# Patient Record
Sex: Female | Born: 1950 | Race: White | Hispanic: No
Health system: Western US, Academic
[De-identification: ages and names within clinical notes are randomized; demographics above are authoritative.]

## PROBLEM LIST (undated history)

## (undated) DIAGNOSIS — F32A Depression, unspecified: Secondary | ICD-10-CM

## (undated) DIAGNOSIS — K219 Gastro-esophageal reflux disease without esophagitis: Secondary | ICD-10-CM

## (undated) DIAGNOSIS — D72829 Elevated white blood cell count, unspecified: Secondary | ICD-10-CM

## (undated) DIAGNOSIS — F419 Anxiety disorder, unspecified: Secondary | ICD-10-CM

## (undated) DIAGNOSIS — M1990 Unspecified osteoarthritis, unspecified site: Secondary | ICD-10-CM

## (undated) DIAGNOSIS — M5090 Cervical disc disorder, unspecified, unspecified cervical region: Secondary | ICD-10-CM

## (undated) DIAGNOSIS — N950 Postmenopausal bleeding: Secondary | ICD-10-CM

## (undated) DIAGNOSIS — J45909 Unspecified asthma, uncomplicated: Secondary | ICD-10-CM

## (undated) HISTORY — DX: Unspecified asthma, uncomplicated: J45.909

## (undated) HISTORY — DX: Elevated white blood cell count, unspecified: D72.829

## (undated) HISTORY — DX: Unspecified osteoarthritis, unspecified site: M19.90

## (undated) HISTORY — DX: Postmenopausal bleeding: N95.0

## (undated) HISTORY — PX: PR UNLISTED PROCEDURE FEMUR/KNEE: 27599

## (undated) HISTORY — DX: Cervical disc disorder, unspecified, unspecified cervical region: M50.90

## (undated) HISTORY — DX: Gastro-esophageal reflux disease without esophagitis: K21.9

## (undated) HISTORY — DX: Anxiety disorder, unspecified: F41.9

## (undated) HISTORY — PX: PR UNLISTED PROCEDURE NECK/THORAX: 21899

## (undated) HISTORY — DX: Depression, unspecified: F32.A

## (undated) MED ORDER — FOLIC ACID 1 MG OR TABS
ORAL_TABLET | ORAL | 3 refills | Status: AC
Start: 2019-08-03 — End: ?

## (undated) MED ORDER — ELIQUIS 5 MG OR TABS: 5.0000 mg | ORAL_TABLET | Freq: Two times a day (BID) | ORAL | 3 refills | Status: AC

## (undated) MED ORDER — METHOTREXATE SODIUM 2.5 MG OR TABS
ORAL_TABLET | ORAL | 3 refills | Status: AC
Start: 2020-09-26 — End: ?

## (undated) MED ORDER — VENTOLIN HFA 108 (90 BASE) MCG/ACT IN AERS
INHALATION_SPRAY | RESPIRATORY_TRACT | Status: AC
Start: 2015-05-05 — End: ?

## (undated) MED ORDER — METHOTREXATE SODIUM 2.5 MG OR TABS
ORAL_TABLET | ORAL | 0 refills | Status: AC
Start: 2022-04-20 — End: ?

## (undated) MED ORDER — FOLIC ACID 1 MG OR TABS
ORAL_TABLET | ORAL | 3 refills | Status: AC
Start: 2020-09-26 — End: ?

---

## 1999-08-19 HISTORY — PX: SURGICAL HX OTHER: 99

## 2005-11-18 HISTORY — PX: PR COLONOSCOPY FLX DX W/COLLJ SPEC WHEN PFRMD: 45378

## 2010-02-18 DIAGNOSIS — M79674 Pain in right toe(s): Secondary | ICD-10-CM

## 2010-02-18 HISTORY — DX: Pain in right toe(s): M79.674

## 2011-04-19 DIAGNOSIS — R252 Cramp and spasm: Secondary | ICD-10-CM

## 2011-04-19 HISTORY — DX: Cramp and spasm: R25.2

## 2011-09-16 ENCOUNTER — Other Ambulatory Visit (INDEPENDENT_AMBULATORY_CARE_PROVIDER_SITE_OTHER): Payer: Self-pay

## 2011-09-16 MED ORDER — FLUTICASONE PROPIONATE 50 MCG/ACT NA SUSP
NASAL | Status: DC
Start: 2011-09-16 — End: 2012-02-29

## 2011-09-16 MED ORDER — TRIAMTERENE-HCTZ 37.5-25 MG OR TABS
ORAL_TABLET | ORAL | Status: DC
Start: 2011-09-16 — End: 2012-02-29

## 2011-09-16 MED ORDER — MONTELUKAST SODIUM 10 MG OR TABS
ORAL_TABLET | ORAL | Status: DC
Start: 2011-09-16 — End: 2012-02-29

## 2011-09-16 MED ORDER — LANSOPRAZOLE 30 MG OR CPDR
DELAYED_RELEASE_CAPSULE | ORAL | Status: DC
Start: 2011-09-16 — End: 2012-02-29

## 2011-09-16 MED ORDER — FLOVENT HFA 110 MCG/ACT IN AERO
INHALATION_SPRAY | RESPIRATORY_TRACT | Status: DC
Start: 2011-09-16 — End: 2013-08-20

## 2011-09-16 MED ORDER — PROAIR HFA 108 (90 BASE) MCG/ACT IN AERS
INHALATION_SPRAY | RESPIRATORY_TRACT | Status: DC
Start: 2011-09-16 — End: 2013-08-16

## 2011-11-20 ENCOUNTER — Other Ambulatory Visit (INDEPENDENT_AMBULATORY_CARE_PROVIDER_SITE_OTHER): Payer: Self-pay

## 2011-11-22 MED ORDER — CLOTRIMAZOLE-BETAMETHASONE 1-0.05 % EX CREA
TOPICAL_CREAM | CUTANEOUS | Status: DC
Start: 2011-11-20 — End: 2012-05-26

## 2011-12-30 ENCOUNTER — Telehealth (INDEPENDENT_AMBULATORY_CARE_PROVIDER_SITE_OTHER): Payer: Self-pay

## 2011-12-30 NOTE — Telephone Encounter (Signed)
You have chart

## 2011-12-30 NOTE — Telephone Encounter (Signed)
Tell her that normally we have the Sleep specialist who ordered it do this.

## 2011-12-30 NOTE — Telephone Encounter (Signed)
Message copied by Consuello Bossier on Thu Dec 30, 2011  9:11 AM  ------       Message from: Ovidio Hanger D'ONE       Created: Wed Dec 29, 2011  4:51 PM       Contact: Patient         JSH, contact # is [phone]       Pt needs you to fax a presc to [facility] at [phone], for a CPAP machine. Name is Resmed/model # [id number]. Thx, md  ------

## 2012-01-06 ENCOUNTER — Telehealth (INDEPENDENT_AMBULATORY_CARE_PROVIDER_SITE_OTHER): Payer: Self-pay

## 2012-01-06 NOTE — Telephone Encounter (Signed)
It was explained to pt that the sleep specialist is normally responsible for writing orders for machines and supplies. Contact info was given to [facility] sleep med specialists if she needs it.

## 2012-02-29 ENCOUNTER — Other Ambulatory Visit (INDEPENDENT_AMBULATORY_CARE_PROVIDER_SITE_OTHER): Payer: Self-pay

## 2012-02-29 MED ORDER — LANSOPRAZOLE 30 MG OR CPDR
DELAYED_RELEASE_CAPSULE | ORAL | Status: DC
Start: 2012-02-29 — End: 2012-06-22

## 2012-02-29 MED ORDER — MONTELUKAST SODIUM 10 MG OR TABS
ORAL_TABLET | ORAL | Status: DC
Start: 2012-02-29 — End: 2012-06-20

## 2012-02-29 MED ORDER — TRIAMTERENE-HCTZ 37.5-25 MG OR TABS
ORAL_TABLET | ORAL | Status: DC
Start: 2012-02-29 — End: 2012-06-22

## 2012-02-29 MED ORDER — FLUTICASONE PROPIONATE 50 MCG/ACT NA SUSP
NASAL | Status: DC
Start: 2012-02-29 — End: 2012-08-25

## 2012-03-17 ENCOUNTER — Other Ambulatory Visit: Payer: Self-pay

## 2012-05-26 ENCOUNTER — Other Ambulatory Visit (INDEPENDENT_AMBULATORY_CARE_PROVIDER_SITE_OTHER): Payer: Self-pay

## 2012-05-27 ENCOUNTER — Encounter (INDEPENDENT_AMBULATORY_CARE_PROVIDER_SITE_OTHER): Payer: Self-pay

## 2012-05-27 MED ORDER — CLOTRIMAZOLE-BETAMETHASONE 1-0.05 % EX CREA
TOPICAL_CREAM | CUTANEOUS | Status: DC
Start: 2012-05-26 — End: 2015-05-30

## 2012-05-27 NOTE — Telephone Encounter (Signed)
error 

## 2012-06-20 ENCOUNTER — Other Ambulatory Visit (INDEPENDENT_AMBULATORY_CARE_PROVIDER_SITE_OTHER): Payer: Self-pay

## 2012-06-21 ENCOUNTER — Encounter (INDEPENDENT_AMBULATORY_CARE_PROVIDER_SITE_OTHER): Payer: Self-pay

## 2012-06-21 MED ORDER — MONTELUKAST SODIUM 10 MG OR TABS
ORAL_TABLET | ORAL | Status: DC
Start: 2012-06-20 — End: 2012-08-25

## 2012-06-22 ENCOUNTER — Other Ambulatory Visit (INDEPENDENT_AMBULATORY_CARE_PROVIDER_SITE_OTHER): Payer: Self-pay

## 2012-06-22 MED ORDER — TRIAMTERENE-HCTZ 37.5-25 MG OR TABS
ORAL_TABLET | ORAL | Status: DC
Start: 2012-06-22 — End: 2012-08-25

## 2012-06-22 MED ORDER — LANSOPRAZOLE 30 MG OR CPDR
DELAYED_RELEASE_CAPSULE | ORAL | Status: DC
Start: 2012-06-22 — End: 2012-08-25

## 2012-07-24 ENCOUNTER — Encounter (INDEPENDENT_AMBULATORY_CARE_PROVIDER_SITE_OTHER): Payer: Self-pay

## 2012-07-24 DIAGNOSIS — F4321 Adjustment disorder with depressed mood: Secondary | ICD-10-CM | POA: Insufficient documentation

## 2012-07-27 ENCOUNTER — Other Ambulatory Visit (INDEPENDENT_AMBULATORY_CARE_PROVIDER_SITE_OTHER): Payer: Self-pay

## 2012-07-27 MED ORDER — LANSOPRAZOLE 30 MG OR CPDR
DELAYED_RELEASE_CAPSULE | ORAL | Status: DC
Start: 2012-07-27 — End: 2012-08-25

## 2012-07-27 MED ORDER — TRIAMTERENE-HCTZ 37.5-25 MG OR TABS
ORAL_TABLET | ORAL | Status: DC
Start: 2012-07-27 — End: 2012-08-25

## 2012-07-27 MED ORDER — MONTELUKAST SODIUM 10 MG OR TABS
ORAL_TABLET | ORAL | Status: DC
Start: 2012-07-27 — End: 2012-08-25

## 2012-07-28 ENCOUNTER — Encounter (INDEPENDENT_AMBULATORY_CARE_PROVIDER_SITE_OTHER): Payer: Self-pay

## 2012-07-28 DIAGNOSIS — M1990 Unspecified osteoarthritis, unspecified site: Secondary | ICD-10-CM | POA: Insufficient documentation

## 2012-07-31 ENCOUNTER — Other Ambulatory Visit: Payer: Self-pay

## 2012-07-31 ENCOUNTER — Ambulatory Visit (INDEPENDENT_AMBULATORY_CARE_PROVIDER_SITE_OTHER): Payer: No Typology Code available for payment source

## 2012-07-31 ENCOUNTER — Encounter (INDEPENDENT_AMBULATORY_CARE_PROVIDER_SITE_OTHER): Payer: Self-pay

## 2012-07-31 VITALS — BP 134/88 | HR 82 | Ht 66.0 in | Wt 318.0 lb

## 2012-07-31 LAB — CBC, DIFF
% Basophils: 1 %
% Eosinophils: 3 %
% Immature Granulocytes: 1 %
% Lymphocytes: 23 %
% Monocytes: 5 %
% Neutrophils: 67 %
Absolute Eosinophil Count: 0.29 10*3/uL (ref 0.00–0.50)
Absolute Lymphocyte Count: 2.34 10*3/uL (ref 1.00–4.80)
Basophils: 0.05 10*3/uL (ref 0.00–0.20)
Hematocrit: 45 % (ref 36–45)
Hemoglobin: 15.3 g/dL (ref 11.5–15.5)
Immature Granulocytes: 0.05 10*3/uL (ref 0.00–0.05)
MCH: 28.1 pg (ref 27.3–33.6)
MCHC: 34.2 g/dL (ref 32.2–36.5)
MCV: 82 fL (ref 81–98)
Monocytes: 0.56 10*3/uL (ref 0.00–0.80)
Neutrophils: 7.04 10*3/uL — ABNORMAL HIGH (ref 1.80–7.00)
Platelet Count: 279 10*3/uL (ref 150–400)
RBC: 5.44 mil/uL — ABNORMAL HIGH (ref 3.80–5.00)
RDW-CV: 14.6 % — ABNORMAL HIGH (ref 11.6–14.4)
WBC: 10.33 10*3/uL — ABNORMAL HIGH (ref 4.3–10.0)

## 2012-07-31 LAB — COMPREHENSIVE METABOLIC PANEL
ALT (GPT): 26 U/L (ref 6–40)
AST (GOT): 23 U/L (ref 15–40)
Albumin: 4.1 g/dL (ref 3.5–5.2)
Alkaline Phosphatase (Total): 76 U/L (ref 31–132)
Anion Gap: 11 (ref 3–11)
Bilirubin (Total): 0.9 mg/dL (ref 0.2–1.3)
Calcium: 9.5 mg/dL (ref 8.9–10.2)
Carbon Dioxide, Total: 24 mEq/L (ref 22–32)
Chloride: 103 mEq/L (ref 98–108)
Creatinine: 0.84 mg/dL (ref 0.38–1.02)
GFR, Calc, African American: >60 mL/min (ref >59)
GFR, Calc, European American: >60 mL/min (ref >59)
Glucose: 104 mg/dL (ref 62–125)
Potassium: 3.6 mEq/L — ABNORMAL LOW (ref 3.7–5.2)
Protein (Total): 6.6 g/dL (ref 6.0–8.2)
Sodium: 138 mEq/L (ref 136–145)
Urea Nitrogen: 13 mg/dL (ref 8–21)

## 2012-07-31 LAB — LIPID PANEL
Cholesterol (LDL): 119 mg/dL (ref <130)
Cholesterol/HDL Ratio: 3.4
HDL Cholesterol: 60 mg/dL (ref >40)
Non-HDL Cholesterol: 141 mg/dL (ref 0–159)
Total Cholesterol: 201 mg/dL — ABNORMAL HIGH (ref <200)
Triglyceride: 111 mg/dL (ref <150)

## 2012-07-31 LAB — MAGNESIUM: Magnesium: 2.1 mg/dL (ref 1.8–2.4)

## 2012-07-31 LAB — THYROID STIMULATING HORMONE: Thyroid Stimulating Hormone: 2.299 u[IU]/mL (ref 0.400–5.000)

## 2012-07-31 NOTE — Progress Notes (Signed)
DATE: 07/31/2012     Caitlin Wiley is a 62 year old female who is here today for review of multiple medical problems and for a wellness exam.     PROBLEMS TO BE ADDRESSED ON TODAYS VISIT:  (V70.0) Routine general medical examination at a health care facility  (primary encounter diagnosis)  (272.0) Hypercholesterolemia  (401.9) HTN (hypertension)  (493.00) Asthma, mild intermittent, uncomplicated  (V12.72) Hx of colonic polyps--Dr Read--03/21/10  cleared for 3 years  (780.57) Sleep apnea  (715.90) OA (osteoarthritis)    HPI:  Caitlin Wiley is a 62 year old female who is here today for review of multiple medical problems and for a wellness exam. Pt reports stable BP for the past while, however she noted an elevated BP while in the clinic today. Pt also reports palpitations last Thursday, however she did feel anxious and dehydrated at the time. The palpitations were described as tachycardia with the symptom lasting 5-10 minutes, she denied associated chest pain or nausea. Pt states that the palpitations completely resolved after drinking a large glass of water and resting for five minutes. Pt reports history of palpitations secondary to anxiety that are the same in nature and severity as the episode on Thursday. Denies exacerbations or changes with her asthma. Pt also notes intermittent dizziness with positional changes such as standing up and especially with rapidly turning her head laterally. Pt states that the dizziness episodes occur once a day and last only a few seconds. Pt also reports R visual flashes, she has been evaluated for the symptoms by Dr. Yetta Barre. Now, pt reports dissipated visual flashes and she only experiences "black" floaters to the R eye. Denies any other medical complaints at this time.     Last colonoscopy performed on 03/21/10, pt is cleared for three years.     PROBLEM LIST:  Patient Active Problem List   Diagnosis    Asthma    Hypercholesterolemia    Hx of colonic polyps--Dr  Read--03/21/10  cleared for 5 years    Edema    Sleep apnea    HTN (hypertension)    Menopausal state    Rash    Pulmonary nodule    Grief reaction    Strain of thoracic spine    OA (osteoarthritis)       SURGICAL HISTORY:  Past Surgical History   Procedure Laterality Date    Colonoscopy flx dx w/wo collj specimens  11/2005    Surgical hx other  08/1999     knee surgery       MEDICATIONS:  Outpatient Prescriptions Prior to Visit   Medication Sig Dispense Refill    Ascorbic Acid (VITAMIN C OR) 500 mg        ASPIRIN OR 81 mg        CALCIUM OR 600 mg        CLONAZEPAM OR 0.5 mg q 12 h prn anxiety        Clotrimazole-Betamethasone 1-0.05 % External Cream APPLY TO AFFECTED AREA(S) TWO TIMES DAILY  30 g  0    FISH OIL  1000 mg        Flaxseed, Linseed, (FLAXSEED OIL) 1200 MG Oral Cap None Entered        FLOVENT HFA 110 MCG/ACT Inhalation Aerosol INHALE TWO PUFFS BY MOUTH TWICE DAILY - RINSE MOUTH AFTER USE  2 Inhaler  3    Fluticasone Propionate 50 MCG/ACT Nasal Suspension USE TWO SPRAYS IN EACH NOSTRIL TWO TIMES DAILY  1 Inhaler  3    Lansoprazole 30 MG Oral CAPSULE DELAYED RELEASE TAKE ONE CAPSULE BY MOUTH ONCE DAILY  32 Cap  3    Lansoprazole 30 MG Oral CAPSULE DELAYED RELEASE TAKE ONE CAPSULE BY MOUTH ONCE DAILY  32 Cap  3    Montelukast Sodium 10 MG Oral Tab TAKE ONE TABLET BY MOUTH ONCE DAILY  30 Tab  3    Montelukast Sodium 10 MG Oral Tab TAKE ONE TABLET BY MOUTH ONCE DAILY  30 Tab  3    Multiple Vitamins-Minerals (MULTIVITAMIN OR) no iron        PROAIR HFA 108 (90 BASE) MCG/ACT Inhalation Aero Soln INHALE ONE TO TWO PUFFS BY MOUTH FOUR TIMES A DAY AS NEEDED  2 Inhaler  3    Triamterene-HCTZ 37.5-25 MG Oral Tab TAKE ONE TABLET BY MOUTH ONCE DAILY  34 Tab  3    Triamterene-HCTZ 37.5-25 MG Oral Tab TAKE ONE TABLET BY MOUTH ONCE DAILY  34 Tab  3     No facility-administered medications prior to visit.       ALLERGIES:  Review of patient's allergies indicates:  Allergies   Allergen Reactions     Bee Venom     Cats (Animals)     Dust Mite Extract      DUST    Pollen Extract      POLLEN    Sulfa Antibiotics        FAMILY HISTORY:  Family History   Problem Relation Age of Onset    Arthritis Mother     Arthritis Father     Asthma       and/or allergies - grandfather    Breast Cancer Mother      & tongue cancer    Cancer Father     Cancer       grandparents    Diabetes Father     Colon Polyps Father     Heart Disease Father      torn valve 01/1999    Heart Disease Maternal Grandfather     Heart Disease Paternal Grandfather     Hypertension Father     Mental Illness Mother     Stroke Father     Stroke Mother      possible    Tuberculosis       great-grandmother    Arthritis       grandmother    Breast Cancer       grandmother    Hypertension Mother     Mental Illness Brother     Anesthesia Problems Mother     Lupus Aunt/Uncle      great-aunt       SOCIAL HISTORY:  History     Social History    Marital Status: N/A     Spouse Name: N/A     Number of Children: N/A    Years of Education: N/A     Occupational History    Not on file.     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 Endorses dizziness.   OPHTHALMIC: endorses floaters and flashes to the R eye.   ENT: Denies, hearing difficulties, nasal congestion and any ear nose or throat problems  RESPIRATORY: H/o asthma, sleep apnea   CARDIOVASCULAR: H/o HTN. Denies CP. Endorses palpitations last Thursday that have completely resolved since. Pt reports elevated BP today.   GI: H/o colonic polyps. Denies nausea.   BREAST: Denies,  breast pain, breast nipple discharge, history of abnormal mammogram, history of breast mass  GENITOURINARY: Denies, menstrual problems including irregular, heavy or painful menses, prior abnormal pap smear, dysuria, hematuria, urinary incontinence, urinary urgency, vaginal discharge, abnormal vaginal bleeding, pelvic pain  INTEGUMENTARY: Denies, skin rash, easy bruising, hair loss, changes in moles or new moles  RHEUMATOLOGIC/MSK: H/o osteoarthritis   ENDOCRINE: H/o hypercholesterolemia   HEME/LYMPH:  Denies, Easy bruising, Lumps under arms or around neck and Anemia  PSYCHOSOCIAL: Denies and any psychological problems  HIV RISK: Denies, multiple sexual partners, history of a sexually transmitted disease, transfusion between 1978 and 1985 and use of illicit drugs by injection    PHYSICAL EXAM:  BP 134/88   Pulse 82   Ht 5\' 6"  (1.676 m)   Wt 318 lb (144.244 kg)   BMI 51.35 kg/m2    Physical Examination--Female: Vital Signs as noted above.  BP LUE, sitting: 134/88    General Appearance and Mental Status: Well Developed/Well Nourished  [ ]  Slender  [ ]  Average  [ ]  Heavy [ x] Obese  [x ] Appropriate Affect, Oriented to Person, Place, Time      Skin: Warm, dry to touch    Head: Normocephalic, atraumatic    Eyes: EOMI, PERLA    Ears: TMs clear bilaterally    Nose: Septum intact, no lesions, pink mucosa and turbinates, no discharge    Pharynx: Clear    Neck: Supple, without JVD  or goiter    Chest: Clear     CV: Heart: RRR, no murmur, rub, S3, S4 noted    Breasts: No dominant masses appreciated  Sarah present    Abdomen: Soft, bowel sounds present, without masses or tenderness noted, no hepatosplenomegaly noted.    Extrem: no edema    MSK: some crepitation of the knees  Neuro: intact      LAB RESULTS:  pending    ASSESSMENT AND PLAN:  Pertinent Labs & Imaging studies reviewed. (See chart for details)  Prior EMR records reviewed in EPIC as available and clinically relevant.    1. Hypercholesterolemia  Lipid  panel  CMP    2. HTN (hypertension)  CBC  CMP    3. Asthma, mild intermittent, uncomplicated  stable    4. Hx of colonic polyps--Dr Read--03/21/10  cleared for 3 years    5. Sleep apnea  stable    6. OA (osteoarthritis)  stable    7. Routine general medical examination at a health care facility  - CBC, DIFF  - COMPREHENSIVE METABOLIC PANEL  - LIPID PANEL  - THYROID STIMULATING HORMONE  - U/A AUTO DIPSTICK ONLY  - URINE C&S  - [place] COMPLETE URINALYSIS  - ECG ROUTINE ECG W/LEAST 12 LDS W/I&R  - MAGNESIUM  - DXA BONE DENSITY, AXIAL    8. Arrhythmia  See Dr Marvel Plan  - REFERRAL TO CARDIOLOGY    9. Visual problems--flashes--per Dr Yetta Barre    10. Menopausal state  - DXA BONE DENSITY, AXIAL  Pt provided with cardiology referral. Further treatment dependant on the results of the tests.    Seek immediate attention if symptoms are worsening.    Call if symptoms are not resolving with therapy.      Portions of this chart were written by Erroll Luna, Medical Scribe with oversight by Katy Fitch, MD 07/31/12 9:58 AM     I, Katy Fitch, have reviewed the information recorded by the scribe for accuracy and agree with its contents.    This medical record has been electronically generated with voice recognition software. Every attempt is made to assure accuracy.   However. it may contain errors related to this process.

## 2012-07-31 NOTE — Progress Notes (Signed)
I, Katy Fitch, MD interviewed and examined the patient while overseeing the documentation performed by my Medical Scribe, Erroll Luna. I have reviewed and revised as necessary the scribe's note and agree with the documented findings and plan of care. Please refer to the scribe's note below for further detailed information regarding the patient encounter and exam.  07/31/2012. 10:56 AM.

## 2012-07-31 NOTE — Patient Instructions (Addendum)
A letter will be coming within the next two weeks reviewing the lab results.    Seek immediate attention if symptoms are worsening.    Call if symptoms are not resolving with therapy.

## 2012-08-04 ENCOUNTER — Encounter (INDEPENDENT_AMBULATORY_CARE_PROVIDER_SITE_OTHER): Payer: Self-pay

## 2012-08-08 LAB — PR U/A AUTO DIPSTICK ONLY, ONSITE
Bilirubin (Indirect): NEGATIVE
Glucose, Urine: NEGATIVE mg/dL
Ketones, URN: NEGATIVE mg/dL
Nitrite, URN: NEGATIVE
Occult Blood, URN: NEGATIVE
Protein: NEGATIVE mg/dL
Specific Gravity, URN: 1.01 (ref 1.005–1.030)
Urobilinogen, URN: NEGATIVE E.U./dL
pH, URN (UWNC): 6.5 (ref 5–8)

## 2012-08-16 ENCOUNTER — Encounter (INDEPENDENT_AMBULATORY_CARE_PROVIDER_SITE_OTHER): Payer: Self-pay

## 2012-08-18 ENCOUNTER — Telehealth (INDEPENDENT_AMBULATORY_CARE_PROVIDER_SITE_OTHER): Payer: Self-pay

## 2012-08-18 NOTE — Telephone Encounter (Signed)
Due for CMP

## 2012-08-24 ENCOUNTER — Other Ambulatory Visit (INDEPENDENT_AMBULATORY_CARE_PROVIDER_SITE_OTHER): Payer: Self-pay

## 2012-08-25 MED ORDER — MONTELUKAST SODIUM 10 MG OR TABS
ORAL_TABLET | ORAL | Status: DC
Start: 2012-08-24 — End: 2012-12-18

## 2012-08-25 MED ORDER — TRIAMTERENE-HCTZ 37.5-25 MG OR TABS
ORAL_TABLET | ORAL | Status: DC
Start: 2012-08-24 — End: 2013-03-30

## 2012-08-25 MED ORDER — FLUTICASONE PROPIONATE 50 MCG/ACT NA SUSP
NASAL | Status: DC
Start: 2012-08-24 — End: 2013-08-16

## 2012-08-25 MED ORDER — LANSOPRAZOLE 30 MG OR CPDR
DELAYED_RELEASE_CAPSULE | ORAL | Status: DC
Start: 2012-08-24 — End: 2013-01-19

## 2012-11-07 ENCOUNTER — Encounter (INDEPENDENT_AMBULATORY_CARE_PROVIDER_SITE_OTHER): Payer: Self-pay

## 2012-11-08 ENCOUNTER — Ambulatory Visit (INDEPENDENT_AMBULATORY_CARE_PROVIDER_SITE_OTHER): Payer: No Typology Code available for payment source

## 2012-11-08 ENCOUNTER — Encounter (INDEPENDENT_AMBULATORY_CARE_PROVIDER_SITE_OTHER): Payer: Self-pay

## 2012-11-08 ENCOUNTER — Encounter (INDEPENDENT_AMBULATORY_CARE_PROVIDER_SITE_OTHER): Payer: No Typology Code available for payment source

## 2012-11-08 VITALS — BP 130/90 | HR 65 | Ht 66.25 in | Wt 314.0 lb

## 2012-11-08 MED ORDER — RANITIDINE HCL 150 MG OR TABS
150.0000 mg | ORAL_TABLET | Freq: Every evening | ORAL | Status: DC
Start: 2012-11-08 — End: 2013-01-02

## 2012-11-08 NOTE — Progress Notes (Signed)
DATE: 11/08/2012   Caitlin Wiley is a 62 year old female who is here today for review of multiple medical problems.    PROBLEMS TO BE ADDRESSED ON TODAYS VISIT:  (786.50) Chest pain  (primary encounter diagnosis)  (530.81) GERD (gastroesophageal reflux disease)  (401.9) HTN (hypertension)  (793.11) Pulmonary nodule  (493.90) Asthma, unspecified asthma severity, uncomplicated      HPI:  Caitlin Wiley is a 62 year old female who is here today for review of multiple medical problems. The patient was recently evaluated at the [facility] ED on 11/01/12 for chest pain. The patient was prompted to go to the ED due to continuous mid-sternal chest pain that lasted for three hours. She denied radiation to the LUE or neck, dyspnea, nausea or diaphoresis. A chest pain work up was completed with a CTA of the chest as well, with no acute processes resulting. The patient was subsequently discharged, with thoughts that the pain was due to acid reflux. She does admit to consuming a rich [place] omelet 1.5 hours prior to onset of the chest pain on 11/01/12. H/o similar pain with prior GERD episodes, however the patient was concerned during this episode, as the pain was slightly higher than normal. The patient does follow Dr. Gaylyn Lambert with regards to the GERD, and uses Lansoprazole with relief. Denies current CP, dyspnea, nausea, diaphoresis.     Stress echo completed on 09/11/12 with Dr. Marvel Plan, resulting in scattered PVC, hypertensive exercise response and limited exercise duration. This is a low risk study.     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 The patient was seeing a therapist (Dr. Robinette Haines), but no longer feels this is necessary. Denies SI.     Colonoscopy performed on 04/17/10 with Dr. 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 DXA completed on 07/31/12, resulting in normal bone density of the lumbar spine, and normal bone density at the L hip. She does  get screening mammograms regularly.     PROBLEM LIST:  Patient Active Problem List   Diagnosis    Asthma    Hypercholesterolemia    Hx of colonic polyps--Dr Read--03/21/10  cleared for 5 years    Edema    Sleep apnea    HTN (hypertension)    Menopausal state    Rash    Pulmonary nodule    Grief reaction    Strain of thoracic spine    OA (osteoarthritis)       SURGICAL HISTORY:  Past Surgical History   Procedure Laterality Date    Colonoscopy flx dx w/wo collj specimens  11/2005    Surgical hx other  08/1999     knee surgery       MEDICATIONS:  Outpatient Prescriptions Prior to Visit   Medication Sig Dispense Refill    Ascorbic Acid (VITAMIN C OR) 500 mg        ASPIRIN OR 81 mg        CALCIUM OR 600 mg        CLONAZEPAM OR 0.5 mg q 12 h prn anxiety        Clotrimazole-Betamethasone 1-0.05 % External Cream APPLY TO AFFECTED AREA(S) TWO TIMES DAILY  30 g  0    FISH OIL  1000 mg        Flaxseed, Linseed, (FLAXSEED OIL) 1200 MG Oral Cap None Entered        FLOVENT HFA 110 MCG/ACT Inhalation Aerosol INHALE TWO PUFFS BY  MOUTH TWICE DAILY - RINSE MOUTH AFTER USE  2 Inhaler  3    Fluticasone Propionate 50 MCG/ACT Nasal Suspension USE TWO SPRAYS IN EACH NOSTRIL TWO TIMES DAILY  2 Inhaler  3    Lansoprazole 30 MG Oral CAPSULE DELAYED RELEASE TAKE ONE CAPSULE BY MOUTH ONCE DAILY  32 capsule  3    Montelukast Sodium 10 MG Oral Tab TAKE ONE TABLET BY MOUTH ONCE DAILY  30 tablet  3    Multiple Vitamins-Minerals (MULTIVITAMIN OR) no iron        PROAIR HFA 108 (90 BASE) MCG/ACT Inhalation Aero Soln INHALE ONE TO TWO PUFFS BY MOUTH FOUR TIMES A DAY AS NEEDED  2 Inhaler  3    Triamterene-HCTZ 37.5-25 MG Oral Tab TAKE ONE TABLET BY MOUTH ONCE DAILY  34 tablet  3     No facility-administered medications prior to visit.       Herbert Moors MD, have reviewed the medication list in its entirety and agree with all contents.     ALLERGIES:  Review of patient's allergies indicates:  Allergies   Allergen Reactions     Bee Venom     Cats (Animals)     Dust Mite Extract      DUST    Pollen Extract      POLLEN    Sulfa Antibiotics        FAMILY HISTORY:  Family History   Problem Relation Age of Onset    Arthritis Mother     Arthritis Father     Asthma       and/or allergies - grandfather    Breast Cancer Mother      & tongue cancer    Cancer Father     Cancer       grandparents    Diabetes Father     Colon Polyps Father     Heart Disease Father      torn valve 01/1999    Heart Disease Maternal Grandfather     Heart Disease Paternal Grandfather     Hypertension Father     Mental Illness Mother     Stroke Father     Stroke Mother      possible    Tuberculosis       great-grandmother    Arthritis       grandmother    Breast Cancer       grandmother    Hypertension Mother     Mental Illness Brother     Anesthesia Problems Mother     Lupus Aunt/Uncle      great-aunt       SOCIAL HISTORY:  History     Social History    Marital Status: N/A     Spouse Name: N/A     Number of Children: N/A    Years of Education: N/A     Occupational History    Not on file.     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 Rare exercise.            REVIEW  OF SYSTEMS:  Female  CONSTITUTIONAL: Denies, fatigue, unexpected weight loss, unexpected weight gain  NEUROLOGIC: Denies, headaches and tremors  OPHTHALMIC: Denies, blurry vision, diplopia, eye pain  ENT: Denies, hearing difficulties, nasal congestion and any ear nose or throat problems  RESPIRATORY: h/o pulmonary nodule, sleep apnea, asthma. Denies SOB  CARDIOVASCULAR: H/o HTN. Pt reports resolved CP  GI: Denies, change in appetite, dysphagia, nausea, vomiting, dyspepsia, abdominal pain, diarrhea, constipation,  hematochezia, melena  BREAST: Denies, breast pain, breast nipple discharge, history of abnormal mammogram, history of breast mass  GENITOURINARY: menopausal state   INTEGUMENTARY: Denies, skin rash, easy bruising, hair loss, changes in moles or new moles  RHEUMATOLOGIC/MSK: H/o osteoarthritis, strain of thoracic spine   ENDOCRINE: H/o hypercholesterolemia   HEME/LYMPH:  Denies, Easy bruising, Lumps under arms or around neck and Anemia  PSYCHOSOCIAL: H/o grief reaction, depression. Denies SI  HIV RISK: Denies, multiple sexual partners, history of a sexually transmitted disease, transfusion between 1978 and 1985 and use of illicit drugs by injection    PHYSICAL EXAM:  BP 130/90   Pulse 65   Ht 5' 6.25" (1.683 m)   Wt 314 lb (142.429 kg)   BMI 50.28 kg/m2  Physical Examination--Female: Vital Signs as noted above.    General Appearance and Mental Status: Well Developed/Well Nourished  [ ]  Slender  [ ]  Average  [ ]  Heavy [x ] Obese  [x ] Appropriate Affect, Oriented to Person, Place, Time    Skin: Warm, dry to touch    Head: Normocephalic, atraumatic    Eyes: EOMI, PERLA    Ears: TMs clear bilaterally    Nose: Septum intact, no lesions, pink mucosa and turbinates, no discharge    Pharynx: Clear    Neck: Supple, without JVD  or goiter    Chest: Clear     CV: Heart: RRR, no murmur, rub, S3, S4 noted    Rectal:  deferred    Abdomen: Soft, bowel sounds present, without masses or tenderness noted, no hepatosplenomegaly noted.    Extrem: no edema    MSK: normal gait and station, no clubbing, no cyanosis;  upper and lower extremities show no defects in symmetry, crepitance, tenderness, masses, or effusions;  normal range of motion without dislocation, subluxation or laxity; normal muscle tone and strength.    Neuro: intact    ECG:  Done on 11/08/2012 at [facility]  Rate: 68 bpm, RRR, no significant abnormality    Compared to old EKG from 03/13/10, no significant change found    This ECG was interpreted by me contemporaneously.  Obtaining the ECG had medical necessity in my clinical opinion.    ASSESSMENT AND PLAN:  Pertinent Labs & Imaging studies reviewed. (See chart for details)  Prior EMR records reviewed in EPIC as available and clinically relevant.    1. Chest pain  Likely GI in origin.  However with her risk factors I am going to have her see cardiology once again.  She requests seeing a different cardiologist.  Stress echo completed on 09/11/12 with Dr. Marvel Plan, resulting in scattered PVC, hypertensive exercise response and limited exercise duration. This is a low risk study.   I also added ranitidine to her Prevacid and asked her to discuss this with Dr Read.  - ECG ROUTINE ECG W/LEAST 12 LDS W/I&R  - REFERRAL TO CARDIOLOGY, Dr. Sharlyn Bologna    2. GERD (gastroesophageal reflux disease)  -The patient is advised to contact Dr. Gaylyn Lambert, her gastroenterologist for further evaluation of the GERD.   -  Ranitidine HCl 150 MG Oral Tab; Take 1 tablet (150 mg) by mouth at bedtime.  Dispense: 30 tablet; Refill: 1    3. HTN (hypertension)  Stable    4. Pulmonary nodule  H/o pulmonary nodule, this did result on the most recent CTA of the chest while in the ED at [facility]. I have advised the patient to contact radiology at [facility]  to have the CTA of the chest sent from [facility] radiology for comparison. She was provided with the contact information for the Radiology department, [phone].     5. Asthma, unspecified asthma severity, uncomplicated  Stable    Further treatment dependant on the results of the tests.    Seek immediate attention if symptoms are worsening.    Call if symptoms are not resolving with therapy.      HEALTH MAINTENANCE:    DXA completed on 07/31/12, resulting in normal bone density of the lumbar spine, and normal bone density at the L hip          Mammogram- she does get screening mammograms regularly         Colonoscopy performed on 04/17/10 with Dr. 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 Marvel Plan, resulting in scattered PVC, hypertensive exercise response and limited exercise duration. This is a low risk study.         TDAP- 03/13/10     Portions of this chart were written by Erroll Luna, Medical Scribe with oversight by Katy Fitch, MD 11/08/2012 11:05 AM     I, Katy Fitch, have reviewed the information recorded by the scribe for accuracy and agree with its contents.    This medical record has been electronically generated with voice recognition software. Every attempt is made to assure accuracy.   However. it may contain errors related to this process.

## 2012-11-08 NOTE — Progress Notes (Signed)
I, Katy Fitch, MD interviewed and examined the patient while overseeing the documentation performed by my Medical Scribe, Erroll Luna. I have reviewed and revised as necessary the scribe's note and agree with the documented findings and plan of care. Please refer to the scribe's note below for further detailed information regarding the patient encounter and exam.  11/08/2012. 1:02 PM.

## 2012-11-08 NOTE — Patient Instructions (Addendum)
Seek immediate attention if symptoms are worsening.    Call if symptoms are not resolving with therapy.    Please contact Sharia Reeve to have the CTA of the chest sent to [facility] radiology for comparison. [phone]

## 2012-11-14 ENCOUNTER — Other Ambulatory Visit: Payer: Self-pay

## 2012-11-22 ENCOUNTER — Telehealth (INDEPENDENT_AMBULATORY_CARE_PROVIDER_SITE_OTHER): Payer: Self-pay

## 2012-11-22 NOTE — Telephone Encounter (Signed)
Dayna--please call [phone] and have them request the Chest  CT scan done at [facility] ER on 11/01/12  Which showed a Pulmonary nodule be sent to [facility] radiology for comparison to see if there is any change fdrom a prior CT at [facility]  Thanks

## 2012-11-22 NOTE — Telephone Encounter (Signed)
Per Patient, Caitlin Wiley is requiring that her PCP request the CT that was done at [facility] be forwarded to [facility] for comparison.

## 2012-11-23 ENCOUNTER — Other Ambulatory Visit: Payer: Self-pay

## 2012-11-25 ENCOUNTER — Encounter (INDEPENDENT_AMBULATORY_CARE_PROVIDER_SITE_OTHER): Payer: Self-pay

## 2012-11-27 ENCOUNTER — Telehealth (INDEPENDENT_AMBULATORY_CARE_PROVIDER_SITE_OTHER): Payer: Self-pay

## 2012-11-27 NOTE — Telephone Encounter (Signed)
Dayna--its the same message as in the telephone message from last week.

## 2012-11-27 NOTE — Telephone Encounter (Signed)
Films requested

## 2012-11-27 NOTE — Telephone Encounter (Signed)
Dr Hagedorn's patient.   Caitlin Wiley [facility] [phone]. Someone called with a request while she was at lunch. Need more information. Please send a fax request.   There are no recent images.   There are no [employer] films.     Caitlin Wiley [facility]  FAX [phone]

## 2012-11-28 NOTE — Telephone Encounter (Signed)
Request faxed

## 2012-12-01 ENCOUNTER — Encounter (INDEPENDENT_AMBULATORY_CARE_PROVIDER_SITE_OTHER): Payer: Self-pay

## 2012-12-18 ENCOUNTER — Other Ambulatory Visit (INDEPENDENT_AMBULATORY_CARE_PROVIDER_SITE_OTHER): Payer: Self-pay | Admitting: Internal Medicine

## 2012-12-18 MED ORDER — MONTELUKAST SODIUM 10 MG OR TABS
ORAL_TABLET | ORAL | Status: DC
Start: 2012-12-18 — End: 2013-04-18

## 2013-01-02 ENCOUNTER — Other Ambulatory Visit (INDEPENDENT_AMBULATORY_CARE_PROVIDER_SITE_OTHER): Payer: Self-pay

## 2013-01-02 MED ORDER — RANITIDINE HCL 150 MG OR TABS
150.0000 mg | ORAL_TABLET | Freq: Every evening | ORAL | Status: DC
Start: 2013-01-02 — End: 2013-02-22

## 2013-01-02 NOTE — Telephone Encounter (Signed)
Refill

## 2013-01-19 ENCOUNTER — Encounter (INDEPENDENT_AMBULATORY_CARE_PROVIDER_SITE_OTHER): Payer: Self-pay

## 2013-01-19 ENCOUNTER — Other Ambulatory Visit (INDEPENDENT_AMBULATORY_CARE_PROVIDER_SITE_OTHER): Payer: Self-pay | Admitting: Internal Medicine

## 2013-01-19 DIAGNOSIS — K219 Gastro-esophageal reflux disease without esophagitis: Secondary | ICD-10-CM

## 2013-01-19 MED ORDER — LANSOPRAZOLE 30 MG OR CPDR
DELAYED_RELEASE_CAPSULE | ORAL | Status: DC
Start: 2013-01-19 — End: 2013-05-25

## 2013-01-19 NOTE — Telephone Encounter (Signed)
Refill

## 2013-02-22 ENCOUNTER — Other Ambulatory Visit (INDEPENDENT_AMBULATORY_CARE_PROVIDER_SITE_OTHER): Payer: Self-pay

## 2013-02-22 DIAGNOSIS — K219 Gastro-esophageal reflux disease without esophagitis: Secondary | ICD-10-CM

## 2013-02-22 MED ORDER — RANITIDINE HCL 150 MG OR TABS
150.0000 mg | ORAL_TABLET | Freq: Every evening | ORAL | Status: DC
Start: 2013-02-22 — End: 2013-08-16

## 2013-02-22 NOTE — Telephone Encounter (Signed)
Physical 7/14

## 2013-03-30 ENCOUNTER — Other Ambulatory Visit (INDEPENDENT_AMBULATORY_CARE_PROVIDER_SITE_OTHER): Payer: Self-pay

## 2013-03-30 DIAGNOSIS — I10 Essential (primary) hypertension: Secondary | ICD-10-CM

## 2013-03-30 MED ORDER — TRIAMTERENE-HCTZ 37.5-25 MG OR TABS
1.0000 | ORAL_TABLET | Freq: Every day | ORAL | Status: DC
Start: 2013-03-30 — End: 2013-08-16

## 2013-03-30 NOTE — Telephone Encounter (Signed)
Refill Request    Last visit with PCP: 11/08/12  Next visit: none  Labs: 07/31/12    Last refill: 08/24/12  Letter Sent: NO

## 2013-04-18 ENCOUNTER — Other Ambulatory Visit (INDEPENDENT_AMBULATORY_CARE_PROVIDER_SITE_OTHER): Payer: Self-pay

## 2013-04-18 MED ORDER — MONTELUKAST SODIUM 10 MG OR TABS
10.0000 mg | ORAL_TABLET | Freq: Every day | ORAL | Status: DC
Start: 2013-04-18 — End: 2013-08-16

## 2013-04-18 NOTE — Telephone Encounter (Signed)
Refill Request    Last visit with PCP: 11/08/12  Next visit: none  Labs: 08/18/12    Last refill: 12/18/12 # 30 + 3  Letter Sent: NO

## 2013-05-25 ENCOUNTER — Other Ambulatory Visit (INDEPENDENT_AMBULATORY_CARE_PROVIDER_SITE_OTHER): Payer: Self-pay

## 2013-05-25 DIAGNOSIS — K219 Gastro-esophageal reflux disease without esophagitis: Secondary | ICD-10-CM

## 2013-05-25 MED ORDER — LANSOPRAZOLE 30 MG OR CPDR
30.0000 mg | DELAYED_RELEASE_CAPSULE | Freq: Every day | ORAL | Status: DC
Start: 2013-05-25 — End: 2013-08-16

## 2013-05-25 NOTE — Telephone Encounter (Signed)
Refill Request    Last visit with PCP: 10/14  Chest pain   Physical 7/14  Next visit: ?  Labs: 8/14    Last refill: 4/15   For 6 pills  Letter Sent: YES   Needs physical in July

## 2013-07-22 ENCOUNTER — Encounter (INDEPENDENT_AMBULATORY_CARE_PROVIDER_SITE_OTHER): Payer: Self-pay

## 2013-08-16 ENCOUNTER — Other Ambulatory Visit (INDEPENDENT_AMBULATORY_CARE_PROVIDER_SITE_OTHER): Payer: Self-pay | Admitting: Internal Medicine

## 2013-08-16 DIAGNOSIS — K219 Gastro-esophageal reflux disease without esophagitis: Secondary | ICD-10-CM

## 2013-08-16 DIAGNOSIS — I10 Essential (primary) hypertension: Secondary | ICD-10-CM

## 2013-08-16 MED ORDER — RANITIDINE HCL 150 MG OR TABS
150.0000 mg | ORAL_TABLET | Freq: Every evening | ORAL | Status: DC
Start: 2013-08-16 — End: 2013-10-17

## 2013-08-16 MED ORDER — TRIAMTERENE-HCTZ 37.5-25 MG OR TABS
ORAL_TABLET | ORAL | Status: DC
Start: 2013-08-16 — End: 2013-10-18

## 2013-08-16 MED ORDER — FLUTICASONE PROPIONATE 50 MCG/ACT NA SUSP
2.0000 | Freq: Two times a day (BID) | NASAL | Status: DC
Start: 2013-08-16 — End: 2013-10-17

## 2013-08-16 MED ORDER — MONTELUKAST SODIUM 10 MG OR TABS
10.0000 mg | ORAL_TABLET | Freq: Every day | ORAL | Status: DC
Start: 2013-08-16 — End: 2013-10-17

## 2013-08-16 MED ORDER — LANSOPRAZOLE 30 MG OR CPDR
30.0000 mg | DELAYED_RELEASE_CAPSULE | Freq: Every day | ORAL | Status: DC
Start: 2013-08-16 — End: 2013-10-18

## 2013-08-16 MED ORDER — ALBUTEROL SULFATE HFA 108 (90 BASE) MCG/ACT IN AERS
1.0000 | INHALATION_SPRAY | Freq: Four times a day (QID) | RESPIRATORY_TRACT | Status: DC | PRN
Start: 2013-08-16 — End: 2014-01-14

## 2013-08-16 NOTE — Telephone Encounter (Addendum)
Refill Request    Last visit with PCP: 11/08/12  Next visit: none  Labs: 07/31/12      Letter Sent: NO, letter sent on 7/2/115

## 2013-08-17 ENCOUNTER — Encounter (INDEPENDENT_AMBULATORY_CARE_PROVIDER_SITE_OTHER): Payer: Self-pay | Admitting: Family Medicine

## 2013-08-17 ENCOUNTER — Encounter (HOSPITAL_BASED_OUTPATIENT_CLINIC_OR_DEPARTMENT_OTHER): Payer: Self-pay

## 2013-08-17 ENCOUNTER — Ambulatory Visit: Payer: No Typology Code available for payment source

## 2013-08-17 ENCOUNTER — Ambulatory Visit (INDEPENDENT_AMBULATORY_CARE_PROVIDER_SITE_OTHER): Payer: No Typology Code available for payment source | Admitting: Family Medicine

## 2013-08-17 VITALS — BP 126/88 | HR 70 | Temp 97.4°F | Resp 14 | Wt 317.0 lb

## 2013-08-17 VITALS — BP 116/80 | HR 84 | Ht 66.0 in | Wt 317.0 lb

## 2013-08-17 DIAGNOSIS — R002 Palpitations: Secondary | ICD-10-CM

## 2013-08-17 DIAGNOSIS — I4891 Unspecified atrial fibrillation: Secondary | ICD-10-CM

## 2013-08-17 DIAGNOSIS — I480 Paroxysmal atrial fibrillation: Secondary | ICD-10-CM

## 2013-08-17 MED ORDER — SOTALOL HCL 80 MG OR TABS
80.0000 mg | ORAL_TABLET | Freq: Two times a day (BID) | ORAL | Status: DC
Start: 2013-08-17 — End: 2013-09-22

## 2013-08-17 MED ORDER — WARFARIN SODIUM 5 MG OR TABS
5.0000 mg | ORAL_TABLET | Freq: Every day | ORAL | Status: DC
Start: 2013-08-17 — End: 2013-11-19

## 2013-08-17 NOTE — Progress Notes (Signed)
Chief Complaint   Patient presents with   . Shortness of Breath     dizziness, palpatations x 2 days      S: patient with some short of breath and more palpitations tha the past.  She has a h/o PVCs and never had anything this long. Had some short of breath with stairs and walking.  Denies chest pain, nausea or swelling of her lower legs.     Review of Systems   Respiratory: Negative.    Cardiovascular: Positive for palpitations. Negative for chest pain.   Neurological: Negative.         BP 126/88 mmHg  Pulse 70  Temp(Src) 97.4 F (36.3 C) (Temporal)  Resp 14  Wt 317 lb (143.79 kg)  SpO2 96%  PF 350 L/min    Physical Exam   Neck: Neck supple.   Cardiovascular: Normal pulses.  An irregularly irregular rhythm present.   Pulmonary/Chest: Effort normal and breath sounds normal.   Musculoskeletal: She exhibits no edema.     (427.31) Atrial fibrillation, unspecified  (primary encounter diagnosis)  Plan: new onset, spoke with Dr Ivan Croft (Cardiology) since he had seen before last year. He will see patient today in his office for further care.    (785.1) Intermittent palpitations  Plan: ECG ROUTINE ECG W/LEAST 12 LDS W/I&R        As above

## 2013-08-17 NOTE — Progress Notes (Signed)
CARDIOLOGY PROGRESS NOTE    PATIENT:  Caitlin Wiley  DATE OF BIRTH:  Aug 08, 1950  DATE OF SERVICE:  08/17/2013   PRIMARY CARE PHYSICIAN:   Corey Harold, MD (General)    PROBLEM LIST:    Patient Active Problem List   Diagnosis   . Asthma   . Hypercholesterolemia   . Hx of colonic polyps--Dr Read--03/21/10  cleared for 5 years   . Edema   . Sleep apnea   . HTN (hypertension)   . Menopausal state   . Rash   . Pulmonary nodule   . Grief reaction   . Strain of thoracic spine   . OA (osteoarthritis)   . GERD (gastroesophageal reflux disease)   . Palpitations       HISTORY OF PRESENT ILLNESS:   The patient is seen today in follow up of atrial fibrillation.  Oris Drone was seen in my prior practice for complaints of palpitations.  She had a stress echo evaluation which was limited by her obesity but normal LV function of significant valvular abnormalities.  She had scattered PVCs and after discontinuing caffeine she had no further PVCs.  No documented atrial fibrillation was noted.  She noted 2 days ago at about noon she had a rapid heart rate.  She was uncertain what was going on but ultimately Dr. Chuck Hint who found her to be in atrial fibrillation was rapid.  He was kind enough to call our office because she would not go to the emergency room.  She was seen in our office and by the time she got here the atrial fibrillation had resolved and she was back in sinus rhythm.  She's fairly confident this is the first episode of atrial fibrillation she's had.  She was very definite on the onset and the offset.  She has normal LV function and a low-level stress test which was normal.  Based on this I recommend sotalol as a pill in the pocket strategy.  She does have mild asthma and was warned about the side effects of the beta blockade.  Her Ventura Sellers is significant for female 1 point, questionable hypertension treated with diuretic 1 point based on this I have recommended Coumadin treatment which she is willing to do for  6 months to further assess how much atrial fibrillation she has.    REVIEW OF SYSTEMS:   The complete review of systems is updated.    MEDICATIONS:    Current Outpatient Prescriptions   Medication Sig Dispense Refill   . Albuterol Sulfate HFA (PROAIR HFA) 108 (90 BASE) MCG/ACT Inhalation Aero Soln Inhale 1-2 puffs by mouth 4 times a day as needed. 1 Inhaler 2   . Ascorbic Acid (VITAMIN C OR) 500 mg     . ASPIRIN OR 81 mg     . CALCIUM OR 600 mg     . CLONAZEPAM OR 0.5 mg q 12 h prn anxiety     . Clotrimazole-Betamethasone 1-0.05 % External Cream APPLY TO AFFECTED AREA(S) TWO TIMES DAILY 30 g 0   . FISH OIL  1000 mg     . Flaxseed, Linseed, (FLAXSEED OIL) 1200 MG Oral Cap None Entered     . FLOVENT HFA 110 MCG/ACT Inhalation Aerosol INHALE TWO PUFFS BY MOUTH TWICE DAILY - RINSE MOUTH AFTER USE 2 Inhaler 3   . Fluticasone Propionate 50 MCG/ACT Nasal Suspension Spray 2 sprays into each nostril 2 times a day. 1 Inhaler 1   . GLUCOSAMINE CHONDROITIN COMPLX OR      .  Lansoprazole 30 MG Oral CAPSULE DELAYED RELEASE Take 1 capsule (30 mg) by mouth daily. 32 capsule 1   . Magnesium 400 MG Oral Cap Take 400 mg by mouth.     . Montelukast Sodium 10 MG Oral Tab Take 1 tablet (10 mg) by mouth daily. 30 tablet 1   . Multiple Vitamins-Minerals (MULTIVITAMIN OR) no iron     . Probiotic Product (PROBIOTIC DAILY OR)      . Ranitidine HCl 150 MG Oral Tab Take 1 tablet (150 mg) by mouth at bedtime. 30 tablet 1   . Sotalol HCl 80 MG Oral Tab Take 1 tablet (80 mg) by mouth 2 times a day. 10 tablet 3   . Triamterene-HCTZ 37.5-25 MG Oral Tab TAKE ONE TABLET BY MOUTH ONCE DAILY 34 tablet 1   . Warfarin Sodium (COUMADIN) 5 MG Oral Tab Take 1 tablet (5 mg) by mouth daily. 30 tablet 3     No current facility-administered medications for this visit.       PHYSICAL EXAM:   On exam today, she looks well.  Blood pressure 116/80, pulse 84, height 5\' 6"  (1.676 m), weight 317 lb (143.79 kg), SpO2 96 %.  Weight is as noted in flow sheet.  Pulse is  regular. JVD is less than five. The carotid upstrokes are normal and equal bilaterally without bruits. The apex beat is not palpable. Heart sounds S1 and S2 are normal. There are no added sounds and no murmurs. The lungs are clear. There is no lower extremity edema.     IMPRESSION:  Paroxysmal atrial fibrillation now in sinus rhythm.  Chads score is arguably 1-2.  I have recommended Coumadin treatment which she is willing to do.  We will try pill in the pocket sotalol 80 mg per episode and she will see me in 3 months to reassess this treatment.  She also needs to consider weight loss and more exercise.    [name], MD, [facility]  JP/jm    CC:  Corey Harold, MD (General)           Patient Care Team:  Salley Slaughter as PCP - General ([place])

## 2013-08-17 NOTE — Patient Instructions (Signed)
We will try the pill in the pocket     Take one pill for episode of atrial fibrillation    We will try coumadin fo a six month trial to see how much afib you are having.

## 2013-08-17 NOTE — Patient Instructions (Signed)
What Is Atrial Flutter/Atrial Fibrillation?        The heart has its own electrical system. This system makes the signals that start each heartbeat. The heartbeat begins in1 of the2 upper chambers of the heart (atria). A problem can make the atria beat faster than normal. The atria may beat fast but still evenly. This problem is called atrial flutter. If the atria beat very fast and also unevenly, it is called atrial fibrillation (AFib).  Causes of Atrial Flutter and Atrial Fibrillation  Causes of these problems can include:   Previous heart attack   High blood pressure   Thyroid problems  In many cases, the cause is unknown.  When the Atria Beat Too Fast  The atria may beat fast only once in a while. This is called a paroxysmal heart rhythm problem. If they beat fast all the time, it is a chronic problem.  Atrial Flutter  With atrial flutter, electrical signals travel around and around inside the atria. These circling signals make the atria beat too fast:   Atrial flutter can cause symptoms similar to AFib. It can also lead to the even faster, uneven rhythms of AFib.  Atrial Fibrillation (AFib)  With AFib, cells in the atria send extra electrical signals. These extra signals make the atria beat very fast. They also beat unevenly:   The atria beat so fast and unevenly that they may quiver instead of contracting. If the atria don't contract, they don't move enough blood into the2 lower chambers of the heart (ventricles). This can cause you to feel dizzy or weak.   Blood that doesn't keep moving can pool and form clots in the atria. These clots can move into other parts of the body and cause serious problems such as a stroke.  Symptoms of Atrial Flutter and AFib  These symptoms include the following:   Palpitations (a fluttering, fast heartbeat)   Weakness or tiredness   Shortness of breath   Chest pain or tightness   Dizziness or lightheadedness   Fainting spells    2000-2015 [employer] [place] [place] [address] [place] [address]. All rights reserved. This information is not intended as a substitute for professional medical care. Always follow your healthcare professional's instructions.      Please go to see Dr Ivan Croft, he gave the OK to walk in and get seen.    Jarold Motto MD  Urgent Care

## 2013-08-17 NOTE — Progress Notes (Signed)
Records Reviewed: Meds and Pharmacy

## 2013-08-20 ENCOUNTER — Other Ambulatory Visit (INDEPENDENT_AMBULATORY_CARE_PROVIDER_SITE_OTHER): Payer: Self-pay

## 2013-08-20 DIAGNOSIS — J4520 Mild intermittent asthma, uncomplicated: Secondary | ICD-10-CM

## 2013-08-20 MED ORDER — FLUTICASONE PROPIONATE HFA 110 MCG/ACT IN AERO
INHALATION_SPRAY | RESPIRATORY_TRACT | Status: DC
Start: 2013-08-20 — End: 2013-10-17

## 2013-08-20 NOTE — Telephone Encounter (Signed)
Refill Request    Last visit with PCP: 11/08/12  Next visit: none  Labs: 08/08/12    Last refill: 09/16/11 # 2 + 3  Letter Sent: YES  Diagnosis: ?

## 2013-08-22 ENCOUNTER — Ambulatory Visit: Payer: No Typology Code available for payment source

## 2013-08-22 ENCOUNTER — Telehealth (HOSPITAL_BASED_OUTPATIENT_CLINIC_OR_DEPARTMENT_OTHER): Payer: Self-pay

## 2013-08-22 DIAGNOSIS — I4891 Unspecified atrial fibrillation: Secondary | ICD-10-CM | POA: Insufficient documentation

## 2013-08-22 DIAGNOSIS — I480 Paroxysmal atrial fibrillation: Secondary | ICD-10-CM

## 2013-08-22 LAB — PR PROTHROMBIN TIME, ONSITE: prothrombin INR: 1.6

## 2013-08-22 NOTE — Telephone Encounter (Signed)
Pt questioned if she should be on sotalol prn, this was clarified w Dr Ivan Croft and she should be on sotalol prn afib (amb palpitations) and take when required and call us for appt if she needs to take it.

## 2013-08-22 NOTE — Progress Notes (Signed)
7.5mg  Wednesday, 5mg  x6 recheck in one week.

## 2013-08-22 NOTE — Patient Instructions (Signed)
7.5mg  Wednesday, 5mg  x6 recheck in one week.

## 2013-08-23 NOTE — Progress Notes (Signed)
Reviewed and agree.

## 2013-08-27 NOTE — Progress Notes (Signed)
Reviewed and agree.

## 2013-08-28 ENCOUNTER — Telehealth (HOSPITAL_BASED_OUTPATIENT_CLINIC_OR_DEPARTMENT_OTHER): Payer: Self-pay

## 2013-08-28 NOTE — Telephone Encounter (Signed)
Pt called stating she feels a fluttering sensation in her chest. She denies pain, sweating, nausea, and dizziness. She has been seen by Dr Ivan Croft for Afib and has a PRN order for sotalol. She reports her bp as ~140/80 which she reports is higher than her norm. Instructed her to take her sotalol as directed, hydrate (she mentions that she feels like she "probably hasn't drank enough water") and to come in and see Dr Ivan Croft in the morning at [place]. Also advised her to call 911 if symptoms got worse.

## 2013-08-29 ENCOUNTER — Ambulatory Visit (HOSPITAL_BASED_OUTPATIENT_CLINIC_OR_DEPARTMENT_OTHER): Payer: No Typology Code available for payment source

## 2013-08-29 ENCOUNTER — Encounter (HOSPITAL_BASED_OUTPATIENT_CLINIC_OR_DEPARTMENT_OTHER): Payer: Self-pay

## 2013-08-29 ENCOUNTER — Ambulatory Visit: Payer: No Typology Code available for payment source

## 2013-08-29 VITALS — BP 122/78 | HR 64 | Ht 66.0 in | Wt 310.2 lb

## 2013-08-29 DIAGNOSIS — I4891 Unspecified atrial fibrillation: Secondary | ICD-10-CM

## 2013-08-29 DIAGNOSIS — I480 Paroxysmal atrial fibrillation: Secondary | ICD-10-CM

## 2013-08-29 LAB — PR PROTHROMBIN TIME, ONSITE: prothrombin INR: 3.2

## 2013-08-29 NOTE — Progress Notes (Signed)
Decrease to 5mg  x7 recheck in one week.

## 2013-08-29 NOTE — Patient Instructions (Signed)
Decrease to 5mg  x7 recheck in one week.

## 2013-08-29 NOTE — Progress Notes (Signed)
CARDIOLOGY PROGRESS NOTE    PATIENT:  Caitlin Wiley  DATE OF BIRTH:  1950-04-12  DATE OF SERVICE:  08/29/2013   PRIMARY CARE PHYSICIAN:   Corey Harold, MD (General)    PROBLEM LIST:    Patient Active Problem List   Diagnosis   . Asthma   . Hypercholesterolemia   . Hx of colonic polyps--Dr Read--03/21/10  cleared for 5 years   . Edema   . Sleep apnea   . HTN (hypertension)   . Menopausal state   . Rash   . Pulmonary nodule   . Grief reaction   . Strain of thoracic spine   . OA (osteoarthritis)   . GERD (gastroesophageal reflux disease)   . Palpitations       HISTORY OF PRESENT ILLNESS:   The patient is seen today in follow up of another episode of atrial fibrillation.  She took the sotalol has agreed and converted back to sinus rhythm.  We discussed placing on long-term sotalol but she would prefer not to do this.  She is willing to remain on Coumadin treatment.  She is going to have her sleep apnea device reevaluated and she doesn't think is working right.[name]      REVIEW OF SYSTEMS:   The complete review of systems is updated.    MEDICATIONS:    Current Outpatient Prescriptions   Medication Sig Dispense Refill   . Albuterol Sulfate HFA (PROAIR HFA) 108 (90 BASE) MCG/ACT Inhalation Aero Soln Inhale 1-2 puffs by mouth 4 times a day as needed. 1 Inhaler 2   . Ascorbic Acid (VITAMIN C OR) 500 mg     . ASPIRIN OR 81 mg     . CALCIUM OR 600 mg     . CLONAZEPAM OR 0.5 mg q 12 h prn anxiety     . Clotrimazole-Betamethasone 1-0.05 % External Cream APPLY TO AFFECTED AREA(S) TWO TIMES DAILY 30 g 0   . FISH OIL  1000 mg     . Flaxseed, Linseed, (FLAXSEED OIL) 1200 MG Oral Cap None Entered     . Fluticasone Propionate  HFA (FLOVENT HFA) 110 MCG/ACT Inhalation Aerosol INHALE TWO PUFFS BY MOUTH TWICE DAILY - RINSE MOUTH AFTER USE 1 Inhaler 0   . Fluticasone Propionate 50 MCG/ACT Nasal Suspension Spray 2 sprays into each nostril 2 times a day. 1 Inhaler 1   . GLUCOSAMINE CHONDROITIN COMPLX OR      . Lansoprazole 30 MG  Oral CAPSULE DELAYED RELEASE Take 1 capsule (30 mg) by mouth daily. 32 capsule 1   . Magnesium 400 MG Oral Cap Take 400 mg by mouth.     . Montelukast Sodium 10 MG Oral Tab Take 1 tablet (10 mg) by mouth daily. 30 tablet 1   . Multiple Vitamins-Minerals (MULTIVITAMIN OR) no iron     . Probiotic Product (PROBIOTIC DAILY OR)      . Ranitidine HCl 150 MG Oral Tab Take 1 tablet (150 mg) by mouth at bedtime. 30 tablet 1   . Sotalol HCl 80 MG Oral Tab Take 1 tablet (80 mg) by mouth 2 times a day. 10 tablet 3   . Triamterene-HCTZ 37.5-25 MG Oral Tab TAKE ONE TABLET BY MOUTH ONCE DAILY 34 tablet 1   . Warfarin Sodium (COUMADIN) 5 MG Oral Tab Take 1 tablet (5 mg) by mouth daily. 30 tablet 3     No current facility-administered medications for this visit.       PHYSICAL EXAM:  On exam today, she looks well.  Blood pressure 122/78, pulse 64, height 5\' 6"  (1.676 m), weight 310 lb 3.2 oz (140.706 kg), SpO2 96 %.  Weight is as noted in flow sheet.  Pulse is regular. JVD is less than five. The carotid upstrokes are normal and equal bilaterally without bruits. The apex beat is not palpable. Heart sounds S1 and S2 are normal. There are no added sounds and no murmurs. The lungs are clear. There is no lower extremity edema.     IMPRESSION:  Remains clinically in sinus rhythm.  She will follow me in 3 months or sooner she has recurrent atrial fibrillation.    [name], MD, [facility]  JP/jm    CC:  Corey Harold, MD (General)           Patient Care Team:  Salley Slaughter as PCP - General ([place])

## 2013-08-29 NOTE — Progress Notes (Signed)
Records Reviewed: Meds and Pharmacy

## 2013-08-29 NOTE — Patient Instructions (Signed)
You are back in normal rhythm    For now take the sotalol for episodes    I would like you to have the sleep apnea rechecked.

## 2013-08-30 NOTE — Progress Notes (Signed)
Reviewed and agree.

## 2013-09-03 ENCOUNTER — Telehealth (HOSPITAL_BASED_OUTPATIENT_CLINIC_OR_DEPARTMENT_OTHER): Payer: Self-pay

## 2013-09-03 NOTE — Telephone Encounter (Signed)
Patient states she is feeling much better. Patient states her heart rate is in the 70-80's. Patient states she has been having the extra beats since June. Reviewed heart rate parameters of 60-100, patient sweating, or pounding and than taking Sotaolol would be appropriate. SBP >90 and patient states she monitors it daily. Patient verbalized understanding of POC and will call back if additional assistance can be provided.

## 2013-09-03 NOTE — Telephone Encounter (Signed)
Pt called to ask if Pt should take her Sotalol as she has been experiencing some palpitations and skipping heart beats today. She stated that her HR is 61 which she thinks it is in her normal range. Please call pt to advice.    Routing to [facility] CLINICAL POOL.    Fransico Michael  [facility]  [facility]

## 2013-09-04 ENCOUNTER — Ambulatory Visit: Payer: No Typology Code available for payment source

## 2013-09-04 DIAGNOSIS — I4891 Unspecified atrial fibrillation: Secondary | ICD-10-CM | POA: Insufficient documentation

## 2013-09-04 LAB — PR PROTHROMBIN TIME, ONSITE: prothrombin INR: 3.1

## 2013-09-04 NOTE — Progress Notes (Signed)
Decrease to 2.5mg  Tuesday, 5mg  x6 recheck in one week.

## 2013-09-04 NOTE — Patient Instructions (Signed)
Decrease to 2.5mg  Tuesday, 5mg  x6 recheck in one week.

## 2013-09-05 ENCOUNTER — Ambulatory Visit (HOSPITAL_BASED_OUTPATIENT_CLINIC_OR_DEPARTMENT_OTHER): Payer: No Typology Code available for payment source

## 2013-09-07 NOTE — Progress Notes (Signed)
Reviewed and agree.

## 2013-09-11 ENCOUNTER — Ambulatory Visit: Payer: No Typology Code available for payment source

## 2013-09-11 DIAGNOSIS — I4891 Unspecified atrial fibrillation: Secondary | ICD-10-CM | POA: Insufficient documentation

## 2013-09-11 LAB — PR PROTHROMBIN TIME, ONSITE: prothrombin INR: 2.3

## 2013-09-11 NOTE — Progress Notes (Signed)
2.5mg  Tuesday, 5mg  x6 recheck in two weeks.

## 2013-09-22 ENCOUNTER — Telehealth (HOSPITAL_BASED_OUTPATIENT_CLINIC_OR_DEPARTMENT_OTHER): Payer: Self-pay

## 2013-09-22 ENCOUNTER — Other Ambulatory Visit (HOSPITAL_BASED_OUTPATIENT_CLINIC_OR_DEPARTMENT_OTHER): Payer: Self-pay

## 2013-09-22 DIAGNOSIS — I480 Paroxysmal atrial fibrillation: Secondary | ICD-10-CM

## 2013-09-22 MED ORDER — SOTALOL HCL 80 MG OR TABS
80.0000 mg | ORAL_TABLET | Freq: Two times a day (BID) | ORAL | Status: DC
Start: 2013-09-22 — End: 2014-10-03

## 2013-09-22 NOTE — Telephone Encounter (Signed)
Fate called Friday evening on 09/21/2013.  Caitlin Wiley gone back into atrial fibrillation with a rapid heart rate.  Caitlin Wiley is on Coumadin treatment.  Caitlin Wiley is to start the sotalol 80 mg twice daily and call me in the morning which Caitlin Wiley did.  Caitlin Wiley still remains in atrial fibrillation but the rate is much better and her blood pressure is 119/60.  I sent her a new prescription for sotalol to be taken continuously 80 mg twice a day.  Caitlin Wiley will call the office early Tuesday.  If Caitlin Wiley is still in atrial fibrillation would set up a cardioversion for her.  Caitlin Wiley prefers [facility].

## 2013-09-25 ENCOUNTER — Encounter (HOSPITAL_BASED_OUTPATIENT_CLINIC_OR_DEPARTMENT_OTHER): Payer: Self-pay

## 2013-09-25 ENCOUNTER — Other Ambulatory Visit (HOSPITAL_BASED_OUTPATIENT_CLINIC_OR_DEPARTMENT_OTHER): Payer: Self-pay

## 2013-09-25 ENCOUNTER — Ambulatory Visit: Payer: No Typology Code available for payment source

## 2013-09-25 DIAGNOSIS — Z09 Encounter for follow-up examination after completed treatment for conditions other than malignant neoplasm: Secondary | ICD-10-CM

## 2013-09-25 DIAGNOSIS — I4891 Unspecified atrial fibrillation: Secondary | ICD-10-CM | POA: Insufficient documentation

## 2013-09-25 LAB — THYROID STIMULATING HORMONE: Thyroid Stimulating Hormone: 3.083 u[IU]/mL (ref 0.400–5.000)

## 2013-09-25 LAB — PR PROTHROMBIN TIME, ONSITE: prothrombin INR: 2

## 2013-09-25 LAB — THYROXINE (T4): Thyroxine (T4): 10.2 ug/dL (ref 4.8–10.8)

## 2013-09-25 MED ORDER — ATENOLOL 25 MG OR TABS
25.0000 mg | ORAL_TABLET | Freq: Two times a day (BID) | ORAL | Status: DC
Start: 2013-09-25 — End: 2013-10-03

## 2013-09-25 NOTE — Progress Notes (Signed)
2.5mg  Tuesday, 5mg  x6 recheck in one week.

## 2013-09-25 NOTE — Progress Notes (Signed)
VORB T4, TSH and atenolol 25mg  BID. Dr. Ivan Croft @ [facility], orders routed to him to sign.

## 2013-09-25 NOTE — Patient Instructions (Signed)
2.5mg  Tuesday, 5mg  x6 recheck in one week.

## 2013-09-26 ENCOUNTER — Encounter (INDEPENDENT_AMBULATORY_CARE_PROVIDER_SITE_OTHER): Payer: Self-pay

## 2013-09-28 ENCOUNTER — Telehealth (HOSPITAL_BASED_OUTPATIENT_CLINIC_OR_DEPARTMENT_OTHER): Payer: Self-pay

## 2013-09-28 NOTE — Telephone Encounter (Signed)
Patient states she is feeling better, she thinks the new medication is helping. Patient states she thinks she is going in and out of A fib. Patient denies feeling dizzy, only when "I get up quick from bed." Reviewed transitioning slowly when patient gets up. Patient has a follow-up appointment 10/05/13 with Dr. Ivan Croft.

## 2013-10-01 ENCOUNTER — Encounter (INDEPENDENT_AMBULATORY_CARE_PROVIDER_SITE_OTHER): Payer: Self-pay

## 2013-10-01 ENCOUNTER — Ambulatory Visit (INDEPENDENT_AMBULATORY_CARE_PROVIDER_SITE_OTHER): Payer: No Typology Code available for payment source

## 2013-10-01 ENCOUNTER — Other Ambulatory Visit: Payer: Self-pay

## 2013-10-01 ENCOUNTER — Ambulatory Visit: Payer: No Typology Code available for payment source

## 2013-10-01 ENCOUNTER — Telehealth (INDEPENDENT_AMBULATORY_CARE_PROVIDER_SITE_OTHER): Payer: Self-pay

## 2013-10-01 VITALS — BP 120/90 | HR 60 | Ht 65.75 in | Wt 308.0 lb

## 2013-10-01 DIAGNOSIS — S39012A Strain of muscle, fascia and tendon of lower back, initial encounter: Secondary | ICD-10-CM

## 2013-10-01 DIAGNOSIS — N939 Abnormal uterine and vaginal bleeding, unspecified: Secondary | ICD-10-CM

## 2013-10-01 DIAGNOSIS — I4891 Unspecified atrial fibrillation: Secondary | ICD-10-CM | POA: Insufficient documentation

## 2013-10-01 DIAGNOSIS — I480 Paroxysmal atrial fibrillation: Secondary | ICD-10-CM

## 2013-10-01 DIAGNOSIS — S335XXA Sprain of ligaments of lumbar spine, initial encounter: Secondary | ICD-10-CM

## 2013-10-01 DIAGNOSIS — N926 Irregular menstruation, unspecified: Secondary | ICD-10-CM

## 2013-10-01 LAB — CBC, DIFF
% Basophils: 0 %
% Eosinophils: 2 %
% Immature Granulocytes: 0 %
% Lymphocytes: 23 %
% Monocytes: 6 %
% Neutrophils: 69 %
% Nucleated RBC: 0 %
Absolute Eosinophil Count: 0.4 10*3/uL (ref 0.00–0.50)
Absolute Lymphocyte Count: 3.87 10*3/uL (ref 1.00–4.80)
Basophils: 0.07 10*3/uL (ref 0.00–0.20)
Hematocrit: 42 % (ref 36–45)
Hemoglobin: 14 g/dL (ref 11.5–15.5)
Immature Granulocytes: 0.05 10*3/uL (ref 0.00–0.05)
MCH: 26.4 pg — ABNORMAL LOW (ref 27.3–33.6)
MCHC: 33.3 g/dL (ref 32.2–36.5)
MCV: 79 fL — ABNORMAL LOW (ref 81–98)
Monocytes: 0.99 10*3/uL — ABNORMAL HIGH (ref 0.00–0.80)
Neutrophils: 11.43 10*3/uL — ABNORMAL HIGH (ref 1.80–7.00)
Nucleated RBC: 0 10*3/uL
Platelet Count: 429 10*3/uL — ABNORMAL HIGH (ref 150–400)
RBC: 5.3 mil/uL — ABNORMAL HIGH (ref 3.80–5.00)
RDW-CV: 14.8 % — ABNORMAL HIGH (ref 11.6–14.4)
WBC: 16.81 10*3/uL — ABNORMAL HIGH (ref 4.3–10.0)

## 2013-10-01 LAB — PR PROTHROMBIN TIME, ONSITE: prothrombin INR: 2.2

## 2013-10-01 NOTE — Progress Notes (Signed)
I, Ricki Miller, MD interviewed and examined the patient while overseeing the documentation performed by my Medical Scribe, Dineen Kid. I have reviewed and revised as necessary the scribe's note and agree with the documented findings and plan of care. Please refer to the scribe's note below for further detailed information regarding the patient encounter and exam.  10/01/2013. 6:47 PM.

## 2013-10-01 NOTE — Telephone Encounter (Signed)
Appt given today

## 2013-10-01 NOTE — Progress Notes (Signed)
DATE: 10/01/2013      PHYSICIAN NOTE:  Patient Name: Caitlin Wiley  Medical Record Number: [id number]  Visit Date/time: 10/01/2013     Date of Birth: Oct 15, 1950    CHIEF COMPLAINT:    Caitlin Wiley is a 63 year old female who is here today due to vaginal bleeding    PROBLEMS TO BE ADDRESSED ON TODAYS VISIT:  (626.9) Uterine bleeding  (primary encounter diagnosis)  (847.2) Lumbar strain, initial encounter  (427.31) Paroxysmal atrial fibrillation    HPI:  History provided by patient.    Caitlin Wiley is a 63 year old female who is here today due to vaginal bleeding, onset Saturday. The patient states that Saturday she noticed a clot of blood passing from the vagina, along with watery blood. Then today, she noticed a clear mucus along with vaginal blood. Denies hematochezia, hemorrhoids, hematuria, or abdominal pain. She has not experienced a menstrual cycle for three years. She did experience one episode of slight blood when wiping one month ago, but denies a recurrence. She does have a gynecologist, Dr. Joie Bimler, and would prefer for Dr. Joie Bimler to perform the vaginal examination. She is on warfarin. A protime INR done this morning was 2.2    The patient also reports low back pain for the past while.       Past Medical History:  As cited in EHR, updated and reviewed by myself on 10/01/2013    Family History:  As cited in EHR, updated and reviewed by myself on 10/01/2013    Medications/Allergies:  As cited in EHR, updated and reviewed by myself on 10/01/2013      REVIEW OF SYSTEMS:    CARDIO: H/o paroxysmal atrial fibrillation   GI: Denies hemorrhoids, hematochezia.   GENITOURINARY: pt reports vaginal bleeding and mucus. Denies hematuria.   RHEUMATOLOGIC/MSK: pt reports low back pain     PHYSICAL EXAM:  BP 120/90 mmHg   Pulse 60   Ht 5' 5.75" (1.67 m)   Wt 308 lb (139.708 kg)   BMI 50.09 kg/m2  Physical Exam:  Vital signs as reported above.    Head: Normocephalic, atraumatic    Eyes: EOMI, PERLA    Ears:  TMs clear bilaterally    Nose: Septum intact, no lesions, pink mucosa and turbinates, no discharge    Pharynx: Clear    Neck: Supple, without JVD  or goiter    Chest: Clear    CV: Heart: RRR, no murmur, rub, S3, S4 noted    Extrem: no edema    Abd: soft. No CVA tenderness.     Female GU: deferred to Dr. Joie Bimler     MSK: tenderness to the R lumbar Paraspinous musculature       LAB RESULTS:  Pending    ASSESSMENT AND PLAN:  Pertinent Labs & Imaging studies reviewed. (See chart for details)  Prior EMR records reviewed in EPIC as available and clinically relevant.    1. Uterine bleeding  The patient does have a gynecologist, Dr. Joie Bimler. I did discuss with the patient that she would need an endometrial biopsy. She would prefer to see Dr. Joie Bimler for both the vaginal examination and the endometrial biopsy. She declines a vaginal examination and PAP smear in the clinic today. The patient will call Dr. Joie Bimler as soon as possible to arrange an appointment. A CBC will be ordered to ensure that her blood count is holding stable. The patient is agreeable to this plan, no further questions at this time.   -  US EXAM, PELVIC, COMPLETE  - CBC, DIFF    2. Lumbar strain, initial encounter  I have advised the patient to try alternating ice and heat for the back pain. She will let me know if the symptoms worsen. The patient agrees, no further questions at this time.     3. Paroxysmal atrial fibrillation  Controlled.       This appointment took 15 minutes and more than 50% was spent in counseling and/or coordination of care     Further treatment dependant on the results of the tests.    Seek immediate attention if symptoms are worsening.    Call if symptoms are not resolving with therapy.      Portions of this chart were written by Dineen Kid, Medical Scribe with oversight by Ricki Miller, MD 10/01/2013 2:19 PM     I, Ricki Miller, have reviewed the information recorded by the scribe for accuracy and agree with its contents.    This  medical record has been electronically generated with voice recognition software. Every attempt is made to assure accuracy.   However. it may contain errors related to this process.

## 2013-10-01 NOTE — Telephone Encounter (Signed)
Patient c/o vaginal blood that was in the form of a " broken up clot".  There was minimal residual x 1.  Patient c/o Right lower back pain.  Patient is having her INR managed by [facility].  Patient is concerned and would like to be seen today. Patient phone: [phone].

## 2013-10-01 NOTE — Progress Notes (Signed)
2.5mg  Friday, 5mg  x6 recheck in one week.    Patient reports vaginal bleeding over the weekend and is post menopausal. Patient also reported back pain this am. Encouraged patient to contact PCP and GYN MD asap. Patient asked to call back if any additional services can be provided. Patient's appt. Moved from Friday afternoon to Wednesday am.     Routed to Dr. Ivan Croft for [place].

## 2013-10-01 NOTE — Patient Instructions (Addendum)
Seek immediate attention if symptoms are worsening.    Call if symptoms are not resolving with therapy.    A letter will be coming within the next two weeks reviewing the lab results.      Pelvic Ultrasound  Pelvic ultrasound is an imaging test that uses sound waves to form pictures of your organs. It can help assess pain or other symptoms within your pelvis (area between your hip bones). And in pregnant women, it is used to check the health of the fetus (unborn baby). There are no known risks of diagnostic ultrasound.    Before your test   You may be told to drink several glasses of water or other clear fluid starting 1 to 2 hours before your test. Ask your health care provider for specific instructions.   The test may take 30 to 45 minutes. Allow extra time to check in.  Your sonographer may ask why your doctor has ordered an ultrasound. He or she may also ask when your last period started. Also let the sonographer know:   If youve had an ultrasound exam of this area before   If youve had any pelvic surgery   What medicines you take   If youre pregnant     During your test   You will lie on your back with your abdomen exposed.   A nongreasy gel will be applied to the skin.   The sonographer will move a hand-held transducer (probe) across your pelvis.  The test may include a second part. You can empty your bladder before this part of the test.   You will lie on your back with your knees raised.   A probe covered with nongreasy gel is placed inside your vagina. Or, you may be asked to insert the probe yourself as you would a tampon. The probe should not be painful.    Although the sonographer can answer questions about the test, only a doctor can explain the results.     After your test   Your doctor will discuss the test results with you during a follow-up visit or over the phone.   Your next appointment is: ____________________   [id number],  [place] [address]. All rights reserved. This information is not intended as a substitute for professional medical care. Always follow your healthcare professional's instructions.

## 2013-10-02 ENCOUNTER — Telehealth (INDEPENDENT_AMBULATORY_CARE_PROVIDER_SITE_OTHER): Payer: Self-pay

## 2013-10-02 NOTE — Telephone Encounter (Signed)
I spoke with Fedorko is seeing GYN tomorrow and will see me tomorrow after that.  She will have Dr Joie Bimler call me after her appointment.

## 2013-10-02 NOTE — Telephone Encounter (Signed)
I left the patient a message on her cell phone and home phone call me regarding an elevated white blood count.

## 2013-10-03 ENCOUNTER — Encounter (HOSPITAL_BASED_OUTPATIENT_CLINIC_OR_DEPARTMENT_OTHER): Payer: Self-pay

## 2013-10-03 ENCOUNTER — Encounter (INDEPENDENT_AMBULATORY_CARE_PROVIDER_SITE_OTHER): Payer: Self-pay

## 2013-10-03 ENCOUNTER — Ambulatory Visit: Payer: No Typology Code available for payment source

## 2013-10-03 ENCOUNTER — Ambulatory Visit (INDEPENDENT_AMBULATORY_CARE_PROVIDER_SITE_OTHER): Payer: No Typology Code available for payment source

## 2013-10-03 ENCOUNTER — Other Ambulatory Visit: Payer: Self-pay

## 2013-10-03 VITALS — HR 65 | Resp 16 | Ht 66.0 in | Wt 312.6 lb

## 2013-10-03 VITALS — BP 114/80 | HR 64 | Temp 97.9°F | Ht 65.75 in | Wt 308.0 lb

## 2013-10-03 DIAGNOSIS — I4891 Unspecified atrial fibrillation: Secondary | ICD-10-CM | POA: Insufficient documentation

## 2013-10-03 DIAGNOSIS — I10 Essential (primary) hypertension: Secondary | ICD-10-CM

## 2013-10-03 DIAGNOSIS — I480 Paroxysmal atrial fibrillation: Secondary | ICD-10-CM

## 2013-10-03 DIAGNOSIS — D72829 Elevated white blood cell count, unspecified: Secondary | ICD-10-CM

## 2013-10-03 DIAGNOSIS — I495 Sick sinus syndrome: Secondary | ICD-10-CM

## 2013-10-03 DIAGNOSIS — J4520 Mild intermittent asthma, uncomplicated: Secondary | ICD-10-CM

## 2013-10-03 LAB — CBC, DIFF
% Basophils: 1 %
% Eosinophils: 4 %
% Immature Granulocytes: 0 %
% Lymphocytes: 20 %
% Monocytes: 3 %
% Neutrophils: 72 %
Absolute Eosinophil Count: 0.55 10*3/uL — ABNORMAL HIGH (ref 0.00–0.50)
Absolute Lymphocyte Count: 2.75 10*3/uL (ref 1.00–4.80)
Basophils: 0.14 10*3/uL (ref 0.00–0.20)
Hematocrit: 38 % (ref 36–45)
Hemoglobin: 12.7 g/dL (ref 11.5–15.5)
Immature Granulocytes: 0 10*3/uL (ref 0.00–0.05)
MCH: 26.4 pg — ABNORMAL LOW (ref 27.3–33.6)
MCHC: 33.3 g/dL (ref 32.2–36.5)
MCV: 79 fL — ABNORMAL LOW (ref 81–98)
Monocytes: 0.41 10*3/uL (ref 0.00–0.80)
Neutrophils: 9.89 10*3/uL — ABNORMAL HIGH (ref 1.80–7.00)
Platelet Count: 364 10*3/uL (ref 150–400)
RBC: 4.81 mil/uL (ref 3.80–5.00)
RDW-CV: 15 % — ABNORMAL HIGH (ref 11.6–14.4)
WBC: 13.74 10*3/uL — ABNORMAL HIGH (ref 4.3–10.0)

## 2013-10-03 LAB — COMPREHENSIVE METABOLIC PANEL
ALT (GPT): 19 U/L (ref 7–33)
AST (GOT): 18 U/L (ref 9–38)
Albumin: 3.9 g/dL (ref 3.5–5.2)
Alkaline Phosphatase (Total): 76 U/L (ref 31–132)
Anion Gap: 9 (ref 4–12)
Bilirubin (Total): 0.6 mg/dL (ref 0.2–1.3)
Calcium: 8.8 mg/dL — ABNORMAL LOW (ref 8.9–10.2)
Carbon Dioxide, Total: 23 mEq/L (ref 22–32)
Chloride: 102 mEq/L (ref 98–108)
Creatinine: 0.96 mg/dL (ref 0.38–1.02)
GFR, Calc, African American: >60 mL/min (ref >59)
GFR, Calc, European American: 59 mL/min — ABNORMAL LOW (ref >59)
Glucose: 102 mg/dL (ref 62–125)
Potassium: 3.3 mEq/L — ABNORMAL LOW (ref 3.6–5.2)
Protein (Total): 6.3 g/dL (ref 6.0–8.2)
Sodium: 134 mEq/L — ABNORMAL LOW (ref 135–145)
Urea Nitrogen: 26 mg/dL — ABNORMAL HIGH (ref 8–21)

## 2013-10-03 NOTE — Progress Notes (Signed)
I, Ricki Miller, MD interviewed and examined the patient while overseeing the documentation performed by my Medical Scribe, Dineen Kid. I have reviewed and revised as necessary the scribe's note and agree with the documented findings and plan of care. Please refer to the scribe's note below for further detailed information regarding the patient encounter and exam.  10/03/2013. 7:49 PM.

## 2013-10-03 NOTE — Progress Notes (Signed)
DATE: 10/03/2013      PHYSICIAN NOTE:  Patient Name: Caitlin Wiley  Medical Record Number: [id number]  Visit Date/time: 10/03/2013     Date of Birth: 1950-11-14    CHIEF COMPLAINT:    Caitlin Wiley is a 63 year old female who is here today for follow up regarding leukocytosis     PROBLEMS TO BE ADDRESSED ON TODAYS VISIT:  (288.60) Leukocytosis  (primary encounter diagnosis)  (493.90) Asthma, mild intermittent, uncomplicated  (387.5) Essential hypertension  (427.31) Paroxysmal atrial fibrillation    HPI:  History provided by patient.    Caitlin Wiley is a 64 year old female who is here today for follow up regarding leukocytosis. A CBC from 10/01/13 was elevated at a level of 16.81. She was evaluated by her gynecologist, Dr. Joie Bimler, earlier today regarding uterine bleeding. Dr. Joie Bimler did not believe that the the elevated WBC was related to any gynecological process.     The patient reports post nasal drip with some green drainage for the past while. However it seems improved the past few days.  She then noticed bilateral submandibular  lymphadenopathy yesterday.  Associated symptoms include a slightly hoarse voice, and she feels that her asthma is mildly exacerbated. She denies a cough.  She states that she was briefly on another Beta-blocked for her atrial Fibrillation which was stopped due to asthma and she is now on Sotolol.   Denies neck pain, sinus congestion.       Past Medical History:  As cited in EHR, updated and reviewed by myself on 10/03/2013    Family History:  As cited in EHR, updated and reviewed by myself on 10/03/2013    Medications/Allergies:  As cited in EHR, updated and reviewed by myself on 10/03/2013      REVIEW OF SYSTEMS:    ENT: pt reports some postnasal drip, a hoarse voice. Denies sinus congestion   RESPIRATORY: Denies, dyspnea. H/o mild asthma   CARDIOVASCULAR: Denies, chest pain. H/o HTN, paroxysmal atrial fibrillation   HEME/LYMPH: h/o leukocytosis. pt reports some  lymphadenopathy   MSK: Denies neck pain    PHYSICAL EXAM:  BP 114/80 mmHg   Pulse 64   Temp(Src) 97.9 F (36.6 C)   Ht 5' 5.75" (1.67 m)   Wt 308 lb (139.708 kg)   BMI 50.09 kg/m2  Physical Exam:  Vital signs as reported above.    Head: Normocephalic, atraumatic    Eyes: EOMI, PERLA    Ears: TMs clear bilaterally    Nose: Septum intact, no lesions, pink mucosa and turbinates, no discharge    Pharynx: Clear    Neck: Supple, without JVD  or goiter. There are some small submandibular lymph nodes present bilaterally     Chest: Clear. No wheezing, rales, or rhonchi. No respiratory distress.     CV: Heart: RRR, no murmur, rub, S3, S4 noted    Extrem: no edema      LAB RESULTS:  pending    ASSESSMENT AND PLAN:  Pertinent Labs & Imaging studies reviewed. (See chart for details)  Prior EMR records reviewed in EPIC as available and clinically relevant.    1. Leukocytosis  The CBC from 10/01/13 resulted in leukocytosis, with a WBC of 16.81. The patient was evaluated by her GYN today, who did not believe that there was any gynecological cause for the elevated WBC. The patient does report some lymphadenopathy, a hoarse voice, postnasal drip, and a mild exacerbation of her asthma. Denies sinus congestion. I  will order a UA, a repeat CBC, a blood culture, and a CXR to rule out acute processes. She is afebrile and non-toxic appearing in the clinic today.     This may be a viral.  Dr Ruffin Frederick does not feel there is any endometritis or any other Gyn cause for leukocytosis.  I will do further lab studies today.  Due to her being on Warfarin and with no fever or objective reason to employ an antibiotic we will monitor this carefully.  She is to seek immediate attention for any fever, chills, or worsening of symptoms.  - U/A AUTO DIPSTICK ONLY, ONSITE  - Urinalysis Complete, URN  - URINE C&S  - CBC, DIFF  - CULTURE:BACT - BLOOD  - COMPREHENSIVE METABOLIC PANEL    2. Asthma, mild intermittent, uncomplicated  - X-RAY CHEST 2 VW, to be  performed at [facility]. The patient is to have this study performed today.     3. Essential hypertension  controlled    4. Paroxysmal atrial fibrillation  controlled  - PROTHROMBIN TIME, ONSITE--INR is 2.3      This appointment took 15 minutes and more than 50% was spent in counseling and/or coordination of care     Further treatment dependant on the results of the tests.    Seek immediate attention if symptoms are worsening.    Call if symptoms are not resolving with therapy.      Portions of this chart were written by Dineen Kid, Medical Scribe with oversight by Ricki Miller, MD 10/03/2013 4:03 PM     I, Ricki Miller, have reviewed the information recorded by the scribe for accuracy and agree with its contents.    This medical record has been electronically generated with voice recognition software. Every attempt is made to assure accuracy.   However. it may contain errors related to this process.

## 2013-10-03 NOTE — Patient Instructions (Signed)
You are back in normal rhythm    The sotalol is working but the QT interval has increased and this could be a warning of significant heart arrhythmia.    For now stop the atenolol    I will recheck the ekg in two weeks.    The WBC is high and I am uncertain what is causing this.

## 2013-10-03 NOTE — Patient Instructions (Signed)
Seek immediate attention if symptoms are worsening.    Call if symptoms are not resolving with therapy.    A letter will be coming within the next two weeks reviewing the lab results.

## 2013-10-03 NOTE — Progress Notes (Signed)
CARDIOLOGY PROGRESS NOTE    PATIENT:  Caitlin Wiley  DATE OF BIRTH:  14-Apr-1950  DATE OF SERVICE:  10/03/2013   PRIMARY CARE PHYSICIAN:   Corey Harold, MD (General)    PROBLEM LIST:    Patient Active Problem List   Diagnosis   . Asthma   . Hypercholesterolemia   . Hx of colonic polyps--Dr Read--03/21/10  cleared for 5 years   . Edema   . Sleep apnea   . HTN (hypertension)   . Menopausal state   . Rash   . Pulmonary nodule   . Grief reaction   . Strain of thoracic spine   . OA (osteoarthritis)   . GERD (gastroesophageal reflux disease)   . Palpitations       HISTORY OF PRESENT ILLNESS:   The patient is seen today in follow up of atrial fibrillation.  She called me last week that she gone back into atrial fibrillation and had restarted sotalol 80 mg twice daily.  Heart rate was still fast and see if she was placed on atenolol 25 mg daily.  She did convert back to sinus rhythm.  She now has problems with mild bradycardia.  Her other issue is that since starting Coumadin for the atrial fibrillation she's developed vaginal bleeding and scheduled have an abdominal ultrasound and endometrial biopsy tomorrow.  ECG on 80 mg of sotalol twice daily shows prolongation of the QT interval to 484 and a QTC of 454.  Previous determination in atrial fibrillation with a QTC of 336 and a QTC of 460.  QTC is roughly unchanged however concerning.  I have stopped her atenolol 25 mg daily given the bradycardia.  Since she is undergoing GYN procedure would continue the sotalol for now.  Her last stress test showed no evidence of ischemia and therefore could consider flecainide.      REVIEW OF SYSTEMS:   The complete review of systems is updated.    MEDICATIONS:    Current Outpatient Prescriptions   Medication Sig Dispense Refill   . Albuterol Sulfate HFA (PROAIR HFA) 108 (90 BASE) MCG/ACT Inhalation Aero Soln Inhale 1-2 puffs by mouth 4 times a day as needed. 1 Inhaler 2   . Ascorbic Acid (VITAMIN C OR) 500 mg     . ASPIRIN OR  81 mg     . CALCIUM OR 600 mg     . CLONAZEPAM OR 0.5 mg q 12 h prn anxiety     . Clotrimazole-Betamethasone 1-0.05 % External Cream APPLY TO AFFECTED AREA(S) TWO TIMES DAILY 30 g 0   . FISH OIL  1000 mg     . Flaxseed, Linseed, (FLAXSEED OIL) 1200 MG Oral Cap None Entered     . Fluticasone Propionate  HFA (FLOVENT HFA) 110 MCG/ACT Inhalation Aerosol INHALE TWO PUFFS BY MOUTH TWICE DAILY - RINSE MOUTH AFTER USE 1 Inhaler 0   . Fluticasone Propionate 50 MCG/ACT Nasal Suspension Spray 2 sprays into each nostril 2 times a day. 1 Inhaler 1   . GLUCOSAMINE CHONDROITIN COMPLX OR      . Lansoprazole 30 MG Oral CAPSULE DELAYED RELEASE Take 1 capsule (30 mg) by mouth daily. 32 capsule 1   . Magnesium 400 MG Oral Cap Take 400 mg by mouth.     . Montelukast Sodium 10 MG Oral Tab Take 1 tablet (10 mg) by mouth daily. 30 tablet 1   . Multiple Vitamins-Minerals (MULTIVITAMIN OR) no iron     . Probiotic Product (PROBIOTIC DAILY  OR)      . Ranitidine HCl 150 MG Oral Tab Take 1 tablet (150 mg) by mouth at bedtime. 30 tablet 1   . Sotalol HCl 80 MG Oral Tab Take 1 tablet (80 mg) by mouth 2 times a day. 60 tablet 12   . Triamterene-HCTZ 37.5-25 MG Oral Tab TAKE ONE TABLET BY MOUTH ONCE DAILY 34 tablet 1   . Warfarin Sodium (COUMADIN) 5 MG Oral Tab Take 1 tablet (5 mg) by mouth daily. 30 tablet 3     No current facility-administered medications for this visit.       PHYSICAL EXAM:   On exam today, she looks well.  Pulse 65, resp. rate 16, height 5\' 6"  (1.676 m), weight 312 lb 9.6 oz (141.794 kg), SpO2 97 %.  Weight is as noted in flow sheet.  Pulse is regular. JVD is less than five. The carotid upstrokes are normal and equal bilaterally without bruits. The apex beat is not palpable. Heart sounds S1 and S2 are normal. There are no added sounds and no murmurs. The lungs are clear. There is no lower extremity edema.     IMPRESSION:  I suspect she's had some element of bronchospasm related to the atenolol and/or sotalol treatment.   Atenolol has been withdrawn.  If she continues to have worsening of her asthma symptoms would switch her to flecainide treatment.    [name], MD, [facility]  JP/jm    CC:  Corey Harold, MD (General)           Patient Care Team:  Salley Slaughter as PCP - General ([place])

## 2013-10-03 NOTE — Progress Notes (Signed)
Records Reviewed: Meds, Labs, Diagnostic Tests, Radiology, Consults, Reports and Pharmacy.

## 2013-10-04 ENCOUNTER — Encounter (INDEPENDENT_AMBULATORY_CARE_PROVIDER_SITE_OTHER): Payer: Self-pay

## 2013-10-04 LAB — PR U/A AUTO DIPSTICK ONLY, ONSITE
Bilirubin, Urine: NEGATIVE
Glucose, Urine: NEGATIVE mg/dL
Ketones, URN: NEGATIVE mg/dL
Leukocytes: NEGATIVE
Nitrite, URN: NEGATIVE
Protein: NEGATIVE mg/dL
Specific Gravity, Urine: 1.025 (ref 1.005–1.030)
pH, URN: 6.5 (ref 5.0–8.0)

## 2013-10-04 LAB — URINALYSIS COMPLETE, URN
Bilirubin (Qual), URN: NEGATIVE
Glucose Qual, URN: NEGATIVE
Ketones, URN: NEGATIVE
Leukocyte Esterase, URN: NEGATIVE
Nitrite, URN: NEGATIVE
Protein (Alb Semiquant), URN: NEGATIVE
Specific Gravity, URN: 1.025
pH, URN: 6.5

## 2013-10-05 ENCOUNTER — Encounter (HOSPITAL_BASED_OUTPATIENT_CLINIC_OR_DEPARTMENT_OTHER): Payer: No Typology Code available for payment source

## 2013-10-08 ENCOUNTER — Ambulatory Visit: Payer: No Typology Code available for payment source

## 2013-10-09 LAB — BLOOD C/S: Culture: NO GROWTH

## 2013-10-10 ENCOUNTER — Encounter (INDEPENDENT_AMBULATORY_CARE_PROVIDER_SITE_OTHER): Payer: Self-pay

## 2013-10-16 ENCOUNTER — Telehealth (HOSPITAL_BASED_OUTPATIENT_CLINIC_OR_DEPARTMENT_OTHER): Payer: Self-pay

## 2013-10-16 NOTE — Telephone Encounter (Signed)
Called to check on pt asthma issue and sotalol. She has an appt tomorrow.

## 2013-10-17 ENCOUNTER — Other Ambulatory Visit (INDEPENDENT_AMBULATORY_CARE_PROVIDER_SITE_OTHER): Payer: Self-pay

## 2013-10-17 ENCOUNTER — Ambulatory Visit (HOSPITAL_BASED_OUTPATIENT_CLINIC_OR_DEPARTMENT_OTHER): Payer: No Typology Code available for payment source

## 2013-10-17 ENCOUNTER — Ambulatory Visit: Payer: No Typology Code available for payment source

## 2013-10-17 ENCOUNTER — Encounter (HOSPITAL_BASED_OUTPATIENT_CLINIC_OR_DEPARTMENT_OTHER): Payer: Self-pay

## 2013-10-17 VITALS — BP 118/78 | HR 59 | Ht 66.0 in | Wt 313.2 lb

## 2013-10-17 DIAGNOSIS — J4520 Mild intermittent asthma, uncomplicated: Secondary | ICD-10-CM

## 2013-10-17 DIAGNOSIS — I495 Sick sinus syndrome: Secondary | ICD-10-CM

## 2013-10-17 DIAGNOSIS — K219 Gastro-esophageal reflux disease without esophagitis: Secondary | ICD-10-CM

## 2013-10-17 DIAGNOSIS — I4891 Unspecified atrial fibrillation: Secondary | ICD-10-CM

## 2013-10-17 LAB — PR PROTHROMBIN TIME, ONSITE: prothrombin INR: 1.8

## 2013-10-17 MED ORDER — FLUTICASONE PROPIONATE HFA 110 MCG/ACT IN AERO
INHALATION_SPRAY | RESPIRATORY_TRACT | Status: DC
Start: 2013-10-17 — End: 2015-01-29

## 2013-10-17 MED ORDER — MONTELUKAST SODIUM 10 MG OR TABS
10.0000 mg | ORAL_TABLET | Freq: Every day | ORAL | Status: DC
Start: 2013-10-17 — End: 2014-02-21

## 2013-10-17 MED ORDER — FLUTICASONE PROPIONATE 50 MCG/ACT NA SUSP
2.0000 | Freq: Two times a day (BID) | NASAL | Status: DC
Start: 2013-10-17 — End: 2014-01-14

## 2013-10-17 MED ORDER — RANITIDINE HCL 150 MG OR TABS
150.0000 mg | ORAL_TABLET | Freq: Every evening | ORAL | Status: DC
Start: 2013-10-17 — End: 2014-05-21

## 2013-10-17 NOTE — Patient Instructions (Signed)
2.5mg  Friday, 5mg  x 6 recheck in one week.

## 2013-10-17 NOTE — Progress Notes (Addendum)
CARDIOLOGY PROGRESS NOTE    PATIENT:  Caitlin Wiley  DATE OF BIRTH:  [birth year]  DATE OF SERVICE:  10/17/2013   PRIMARY CARE PHYSICIAN:   Caitlin Harold, MD (General)    PROBLEM LIST:    Patient Active Problem List   Diagnosis   . Asthma   . Hypercholesterolemia   . Hx of colonic polyps--Dr Read--03/21/10  cleared for 5 years   . Edema   . Sleep apnea   . HTN (hypertension)   . Menopausal state   . Rash   . Pulmonary nodule   . Grief reaction   . Strain of thoracic spine   . OA (osteoarthritis)   . GERD (gastroesophageal reflux disease)   . Palpitations       HISTORY OF PRESENT ILLNESS:   The patient is seen today in follow up of atrial fibrillation.  She's remained in sinus rhythm and very happy with the treatment.  EKG again confirms sinus rhythm QT interval is 468 with QTC of 439.  Again this is of borderline  increase.  She would like to continue on the sotalol treatment but not if there is a risk of proarrhythmia.  At this point she is on warfarin and I recommend that she stop the sotalol treatment.  I've asked her ECGs to Dr. Carlye Grippe to see if this changes significant.  If not she can continue the sotalol or consider switching to flecainide with some type of rate controlling medication or Rythmol treatment.  She is not interested in ablative therapies.    REVIEW OF SYSTEMS:   The complete review of systems is updated.    MEDICATIONS:    Current Outpatient Prescriptions   Medication Sig Dispense Refill   . Albuterol Sulfate HFA (PROAIR HFA) 108 (90 BASE) MCG/ACT Inhalation Aero Soln Inhale 1-2 puffs by mouth 4 times a day as needed. 1 Inhaler 2   . Ascorbic Acid (VITAMIN C OR) 500 mg     . ASPIRIN OR 81 mg     . CALCIUM OR 600 mg     . CLONAZEPAM OR 0.5 mg q 12 h prn anxiety     . Clotrimazole-Betamethasone 1-0.05 % External Cream APPLY TO AFFECTED AREA(S) TWO TIMES DAILY 30 g 0   . FISH OIL  1000 mg     . Flaxseed, Linseed, (FLAXSEED OIL) 1200 MG Oral Cap None Entered     . Fluticasone Propionate   HFA (FLOVENT HFA) 110 MCG/ACT Inhalation Aerosol INHALE TWO PUFFS BY MOUTH TWICE DAILY - RINSE MOUTH AFTER USE 1 Inhaler 0   . Fluticasone Propionate 50 MCG/ACT Nasal Suspension Spray 2 sprays into each nostril 2 times a day. 1 Inhaler 1   . GLUCOSAMINE CHONDROITIN COMPLX OR      . Lansoprazole 30 MG Oral CAPSULE DELAYED RELEASE Take 1 capsule (30 mg) by mouth daily. 32 capsule 1   . Magnesium 400 MG Oral Cap Take 400 mg by mouth.     . Montelukast Sodium 10 MG Oral Tab Take 1 tablet (10 mg) by mouth daily. 30 tablet 1   . Multiple Vitamins-Minerals (MULTIVITAMIN OR) no iron     . Probiotic Product (PROBIOTIC DAILY OR)      . Ranitidine HCl 150 MG Oral Tab Take 1 tablet (150 mg) by mouth at bedtime. 30 tablet 1   . Sotalol HCl 80 MG Oral Tab Take 1 tablet (80 mg) by mouth 2 times a day. 60 tablet 12   . Triamterene-HCTZ 37.5-25  MG Oral Tab TAKE ONE TABLET BY MOUTH ONCE DAILY 34 tablet 1   . Warfarin Sodium (COUMADIN) 5 MG Oral Tab Take 1 tablet (5 mg) by mouth daily. 30 tablet 3     No current facility-administered medications for this visit.       PHYSICAL EXAM:   On exam today, she looks well.  Blood pressure 118/78, pulse 59, height 5\' 6"  (1.676 m), weight 313 lb 3.2 oz (142.067 kg), SpO2 94 %.  Weight is as noted in flow sheet.  Pulse is regular. JVD is less than five. The carotid upstrokes are normal and equal bilaterally without bruits. The apex beat is not palpable. Heart sounds S1 and S2 are normal. There are no added sounds and no murmurs. The lungs are clear. There is no lower extremity edema.     IMPRESSION:  Atrial fibrillation with some QT prolongation with use of sotalol.  Will stop therapy for now and consider switching to another agent such as Rythmol or flecainide with rate controlling medication.  Currently on warfarin treatment will continue.    Addendum: I spoke with Dr. Carlye Grippe who also reviewed her ECGs and QT intervals.  He believe she can safely stay on sotalol and I will inform the  patient.    [name], MD, [facility]  JP/jm    CC:  Caitlin Harold, MD (General)           Patient Care Team:  Salley Slaughter as PCP - General ([place])

## 2013-10-17 NOTE — Telephone Encounter (Signed)
Refill Request    Last visit with PCP: 10/03/13  Next visit: none  Labs: ?    Last refill: Flovent 08/20/13, other three 08/16/13  Letter Sent: NO  Diagnosis: 493.90- 530.81

## 2013-10-17 NOTE — Progress Notes (Signed)
Records Reviewed: Meds and Pharmacy

## 2013-10-17 NOTE — Patient Instructions (Addendum)
The QTC interval is still a bit prolonged.    I think we need to change antiarrhythmic treatments.    Stop the sotalol and see me in Two weeks.    Please the sleep apnea doctor and see if the machine is working.

## 2013-10-17 NOTE — Progress Notes (Signed)
2.5mg  Friday, 5mg  x 6 recheck in one week.

## 2013-10-18 ENCOUNTER — Other Ambulatory Visit (INDEPENDENT_AMBULATORY_CARE_PROVIDER_SITE_OTHER): Payer: Self-pay

## 2013-10-18 DIAGNOSIS — K219 Gastro-esophageal reflux disease without esophagitis: Secondary | ICD-10-CM

## 2013-10-18 DIAGNOSIS — I159 Secondary hypertension, unspecified: Secondary | ICD-10-CM

## 2013-10-18 MED ORDER — TRIAMTERENE-HCTZ 37.5-25 MG OR TABS
1.0000 | ORAL_TABLET | Freq: Every day | ORAL | Status: DC
Start: 2013-10-18 — End: 2014-07-15

## 2013-10-18 MED ORDER — LANSOPRAZOLE 30 MG OR CPDR
30.0000 mg | DELAYED_RELEASE_CAPSULE | Freq: Every day | ORAL | Status: DC
Start: 2013-10-18 — End: 2014-06-14

## 2013-10-18 NOTE — Telephone Encounter (Signed)
Refill Request    Last visit with PCP: 10/03/13  Next visit: none  Labs: 10/03/13    Last refill: 08/16/13 both meds  Letter Sent: NO  Diagnosis: GERD, HTN

## 2013-10-19 NOTE — Progress Notes (Signed)
Reviewed and agree.

## 2013-10-22 ENCOUNTER — Ambulatory Visit (HOSPITAL_BASED_OUTPATIENT_CLINIC_OR_DEPARTMENT_OTHER): Payer: No Typology Code available for payment source

## 2013-10-25 ENCOUNTER — Ambulatory Visit: Payer: No Typology Code available for payment source

## 2013-10-25 DIAGNOSIS — I4891 Unspecified atrial fibrillation: Secondary | ICD-10-CM | POA: Insufficient documentation

## 2013-10-25 LAB — PR PROTHROMBIN TIME, ONSITE: prothrombin INR: 1.8

## 2013-10-25 NOTE — Patient Instructions (Signed)
Increase to 5mg  x 7 recheck in one week.

## 2013-10-25 NOTE — Progress Notes (Signed)
Increase to 5mg  x 7 recheck in one week.

## 2013-10-29 NOTE — Progress Notes (Signed)
Reviewed and agree.

## 2013-10-31 ENCOUNTER — Encounter (HOSPITAL_BASED_OUTPATIENT_CLINIC_OR_DEPARTMENT_OTHER): Payer: No Typology Code available for payment source

## 2013-11-05 ENCOUNTER — Ambulatory Visit: Payer: No Typology Code available for payment source

## 2013-11-05 DIAGNOSIS — I4891 Unspecified atrial fibrillation: Secondary | ICD-10-CM | POA: Insufficient documentation

## 2013-11-05 LAB — PR PROTHROMBIN TIME, ONSITE: prothrombin INR: 2.5

## 2013-11-05 NOTE — Progress Notes (Signed)
5mg  x 7 recheck in two to three weeks.

## 2013-11-19 ENCOUNTER — Other Ambulatory Visit (HOSPITAL_BASED_OUTPATIENT_CLINIC_OR_DEPARTMENT_OTHER): Payer: Self-pay

## 2013-11-19 DIAGNOSIS — R002 Palpitations: Secondary | ICD-10-CM

## 2013-11-19 DIAGNOSIS — I480 Paroxysmal atrial fibrillation: Secondary | ICD-10-CM

## 2013-11-19 MED ORDER — WARFARIN SODIUM 5 MG OR TABS
5.0000 mg | ORAL_TABLET | Freq: Every day | ORAL | Status: DC
Start: 2013-11-19 — End: 2014-03-11

## 2013-11-26 ENCOUNTER — Ambulatory Visit: Payer: No Typology Code available for payment source

## 2013-11-26 DIAGNOSIS — I4891 Unspecified atrial fibrillation: Secondary | ICD-10-CM | POA: Insufficient documentation

## 2013-11-26 LAB — PR PROTHROMBIN TIME, ONSITE: prothrombin INR: 1.8

## 2013-11-26 NOTE — Progress Notes (Signed)
Extra 2.5 mg today and than continue  5mg  x 7 recheck in two weeks.

## 2013-11-26 NOTE — Patient Instructions (Signed)
Extra 2.5 mg today and than continue  5mg  x 7 recheck in two weeks.

## 2013-11-29 ENCOUNTER — Ambulatory Visit (INDEPENDENT_AMBULATORY_CARE_PROVIDER_SITE_OTHER): Payer: No Typology Code available for payment source

## 2013-11-29 ENCOUNTER — Ambulatory Visit: Payer: No Typology Code available for payment source

## 2013-11-29 ENCOUNTER — Encounter (INDEPENDENT_AMBULATORY_CARE_PROVIDER_SITE_OTHER): Payer: Self-pay

## 2013-11-29 ENCOUNTER — Encounter (HOSPITAL_BASED_OUTPATIENT_CLINIC_OR_DEPARTMENT_OTHER): Payer: Self-pay

## 2013-11-29 ENCOUNTER — Telehealth (INDEPENDENT_AMBULATORY_CARE_PROVIDER_SITE_OTHER): Payer: Self-pay

## 2013-11-29 VITALS — BP 120/80 | HR 70 | Ht 65.75 in | Wt 320.0 lb

## 2013-11-29 VITALS — BP 130/72 | HR 64 | Wt 325.6 lb

## 2013-11-29 DIAGNOSIS — E7800 Pure hypercholesterolemia, unspecified: Secondary | ICD-10-CM

## 2013-11-29 DIAGNOSIS — H918X2 Other specified hearing loss, left ear: Secondary | ICD-10-CM

## 2013-11-29 DIAGNOSIS — I480 Paroxysmal atrial fibrillation: Secondary | ICD-10-CM | POA: Insufficient documentation

## 2013-11-29 DIAGNOSIS — H6122 Impacted cerumen, left ear: Secondary | ICD-10-CM

## 2013-11-29 DIAGNOSIS — R220 Localized swelling, mass and lump, head: Secondary | ICD-10-CM

## 2013-11-29 NOTE — Progress Notes (Signed)
Caitlin Wiley presents for assessment of left ear decreased hearing and a sense of fluid in the ear.  She also has noted a lesion in the right cheek which is not painful.  However there is no discomfort regarding the left ear as well.      Physical Exam:  Vital signs BP 120/80 mmHg  Pulse 70  Ht 5' 5.75" (1.67 m)  Wt 320 lb (145.151 kg)  BMI 52.05 kg/m2    Skin:  There is a 1 cm mass in the right cheek which seems subcutaneous.    Head: Normocephalic, atraumatic    Eyes: EOMI, PERLA    Ears: Left external canal is ceruminous.  Scant amount of cerumen in the right  external canal.  .  Nose: Septum intact, no lesions, pink mucosa and turbinates, no discharge    Pharynx: Clear    Neck: Supple, without JVD  or goiter    Chest: Clear    CV: Heart: RRR, no murmur, rub, S3, S4 noted    Extrem: no edema    A & P:  1. Hearing loss of left ear due to cerumen impaction  Trial of debrox otic drops  - REFERRAL TO OTO-HEAD NECK SURGERY    2. Cheek mass    - REFERRAL TO GENERAL SURGERY    Call if symptoms are not resolving with therapy.    Seek immediate attention if symptoms are worsening.            This medical record has been electronically generated with voice recognition software. Every attempt is made to assure accuracy.  However, it may contain errors related to this process.

## 2013-11-29 NOTE — Patient Instructions (Signed)
The heart is stable    I will have you see the dietician.

## 2013-11-29 NOTE — Progress Notes (Signed)
CARDIOLOGY PROGRESS NOTE    PATIENT:  Caitlin Wiley  DATE OF BIRTH:  May 07, 1950  DATE OF SERVICE:  11/29/2013   PRIMARY CARE PHYSICIAN:   Salley Slaughter, MD (General)    PROBLEM LIST:    Patient Active Problem List   Diagnosis   . Asthma   . Hypercholesterolemia   . Hx of colonic polyps--Dr Read--03/21/10  cleared for 5 years   . Edema   . Sleep apnea   . HTN (hypertension)   . Menopausal state   . Rash   . Pulmonary nodule   . Grief reaction   . Strain of thoracic spine   . OA (osteoarthritis)   . GERD (gastroesophageal reflux disease)   . Palpitations       HISTORY OF PRESENT ILLNESS:   The patient is seen today in follow up of atrial fibrillation.  She remains on sotalol 80 mg twice daily and off atenolol.  Her asthma symptoms have abated.  She has remained in sinus rhythm.  EKG is QT interval 426 and QTC of 439.  No evidence of ischemia identified.  We had a talk about her weight.  She does not follow a healthy diet and is willing to see her dietitian.  Her sleep apnea has been treated.    REVIEW OF SYSTEMS:   The complete review of systems is updated.    MEDICATIONS:    Current Outpatient Prescriptions   Medication Sig Dispense Refill   . Albuterol Sulfate HFA (PROAIR HFA) 108 (90 BASE) MCG/ACT Inhalation Aero Soln Inhale 1-2 puffs by mouth 4 times a day as needed. 1 Inhaler 2   . Ascorbic Acid (VITAMIN C OR) 500 mg     . ASPIRIN OR 81 mg     . CALCIUM OR 600 mg     . CLONAZEPAM OR 0.5 mg q 12 h prn anxiety     . Clotrimazole-Betamethasone 1-0.05 % External Cream APPLY TO AFFECTED AREA(S) TWO TIMES DAILY 30 g 0   . FISH OIL  1000 mg     . Flaxseed, Linseed, (FLAXSEED OIL) 1200 MG Oral Cap None Entered     . Fluticasone Propionate  HFA (FLOVENT HFA) 110 MCG/ACT Inhalation Aerosol INHALE TWO PUFFS BY MOUTH TWICE DAILY - RINSE MOUTH AFTER USE 1 Inhaler 3   . Fluticasone Propionate 50 MCG/ACT Nasal Suspension Spray 2 sprays into each nostril 2 times a day. 1 Inhaler 3   . GLUCOSAMINE CHONDROITIN COMPLX OR       . Lansoprazole 30 MG Oral CAPSULE DELAYED RELEASE Take 1 capsule (30 mg) by mouth daily. 32 capsule 6   . Magnesium 400 MG Oral Cap Take 400 mg by mouth.     . Montelukast Sodium 10 MG Oral Tab Take 1 tablet (10 mg) by mouth daily. 30 tablet 3   . Multiple Vitamins-Minerals (MULTIVITAMIN OR) no iron     . Probiotic Product (PROBIOTIC DAILY OR)      . Ranitidine HCl 150 MG Oral Tab Take 1 tablet (150 mg) by mouth at bedtime. 30 tablet 3   . Sotalol HCl 80 MG Oral Tab Take 1 tablet (80 mg) by mouth 2 times a day. 60 tablet 12   . Triamterene-HCTZ 37.5-25 MG Oral Tab Take 1 tablet by mouth daily. 34 tablet 6   . Warfarin Sodium (COUMADIN) 5 MG Oral Tab Take 1 tablet (5 mg) by mouth daily. 30 tablet 3     No current facility-administered medications for this visit.  PHYSICAL EXAM:   On exam today, she looks well.  Blood pressure 130/72, pulse 64, weight 325 lb 9.6 oz (147.691 kg), SpO2 96 %.  Weight is as noted in flow sheet.  Pulse is regular. JVD is less than five. The carotid upstrokes are normal and equal bilaterally without bruits. The apex beat is not palpable. Heart sounds S1 and S2 are normal. There are no added sounds and no murmurs. The lungs are clear. There is no lower extremity edema.     IMPRESSION:  Remains in sinus rhythm.  Continue sotalol and Coumadin treatment.  She will see me in 6 months.  I will have her see the dietitian.    [name], MD, [facility]  JP/jm    CC:  Salley Slaughter, MD (General)           Patient Care Team:  Salley Slaughter as PCP - General (INTERNAL MEDICINE MEDICARE)

## 2013-11-29 NOTE — Telephone Encounter (Signed)
CONFIRMED PHONE NUMBER: [phone]  CALLERS FIRST AND LAST NAME: [name]  FACILITY NAME: na TITLE: na  CALLERS RELATIONSHIP:Self  RETURN CALL: General message OK     SUBJECT: Appointment Request   REASON FOR REQUEST: possible ear infection    REQUEST APPOINTMENT WITH: Dr. Elizebeth Brooking or another provider that can see her today  REFERRING PROVIDER: na  REQUESTED DATE: 11/29/2013  REQUESTED TIME: After 230 pm  UNABLE TO APPOINT: Schedule appears full

## 2013-11-29 NOTE — Patient Instructions (Addendum)
Call if symptoms are not resolving with therapy.    Seek immediate attention if symptoms are worsening.

## 2013-11-29 NOTE — Progress Notes (Signed)
Does patient have 3 or more complaints:  NO  If yes, discuss with patient the need to schedule a follow up appointment due to limited appointment time.  Follow up appointment scheduled:  NO  Care Everywhere records available/ reconciled:  NO  PHQ2 complete:  NO  PHQ9 complete:  NO  Refills pended:  NO  Orders pended:  NO  Medications to discontinue:  NO    Health Maintenance updated:  NO    Health Maintenance   Topic Date Due   . PAP SMEAR 21-64 YEARS  03/02/1972   . ZOSTER VACCINE  03/03/2011   . MAMMOGRAM 50+  10/31/2011   . COLONOSCOPY Q 3 YEARS  04/16/2013   . INFLUENZA VACCINE (1) 09/18/2013   . CHOLESTEROL-FEMALE  07/31/2017   . TETANUS BOOSTER  03/13/2020

## 2013-11-29 NOTE — Progress Notes (Signed)
Records Reviewed: Meds and Pharmacy

## 2013-12-03 ENCOUNTER — Encounter (INDEPENDENT_AMBULATORY_CARE_PROVIDER_SITE_OTHER): Payer: Self-pay | Admitting: Otolaryngology

## 2013-12-03 ENCOUNTER — Telehealth (HOSPITAL_BASED_OUTPATIENT_CLINIC_OR_DEPARTMENT_OTHER): Payer: Self-pay

## 2013-12-03 ENCOUNTER — Ambulatory Visit (INDEPENDENT_AMBULATORY_CARE_PROVIDER_SITE_OTHER): Payer: No Typology Code available for payment source | Admitting: Otolaryngology

## 2013-12-03 VITALS — BP 120/80 | Ht 65.75 in | Wt 320.0 lb

## 2013-12-03 DIAGNOSIS — H60542 Acute eczematoid otitis externa, left ear: Secondary | ICD-10-CM

## 2013-12-03 DIAGNOSIS — H6122 Impacted cerumen, left ear: Secondary | ICD-10-CM

## 2013-12-03 NOTE — Patient Instructions (Signed)
Earwax (Treated)    Everyone produces earwax from the lining of the ear canal. It lubricates and protects the ear. The wax that forms in the canal slowly moves toward the outside of the ear and falls out. Sometimes wax can build up in the ear canal. This can cause a blockage and loss of hearing. A buildup of earwax was removed from your ear today.  Home care  If you have a tendency to build up wax in the ear canal, you should clear the wax at home regularly, before it causes discomfort. This should be about once every six months.   Unless a medicine was prescribed, you may use an over-the-counter product made for clearing earwax. These contain carbamide peroxide and are available over-the-counter in a kit with a small bulb syringe.   Lie down with the blocked ear facing upward. Apply one dropper full of medicine and wait a few minutes. Grasp the outer ear and wiggle it to helpthe solutionenter the canal.   Lean over a sink or basin with the blocked ear turned downward. Use a rubber bulb syringe filled with warm (not hot or cold)water to rinse the ear several times. Use gentle pressure only. You may need to repeat the irrigation several times before the wax flows out.   If you are having trouble draining all the water out of your ear canal, put a few drops of rubbing alcohol into the ear canal. This will helpremove the remaining water.  Don'ts   Don't use cold water to rinse the ear. This will make you dizzy.   Don't perform this procedure if you have an ear infection (ear pain, fever, or fluid draining from the ear).   Don't perform this procedure if you have a punctured eardrum.   Don't use cotton swabs, matches, hairpins, keys, or other objects to "clean" the ear canal. This can cause infection of the ear canal or rupture of the eardrum. Because of their size and shape, cottonswabs can push the earwax deeper into the ear canal instead of removing it.  Follow-up care  Follow up with your health care  provider, or as advised.  When to seek medical advice  Call your health care provider right away if any of these occur:   Worsening ear pain   Fever of 101F (38.3C) or higher, or as directed by your health care provider   Hearing does not return to normal after three days of treatment   Fluid drainage or bleeding from the ear canal   Swelling, redness, or tenderness of the outer ear   Headache, neck pain, or stiff neck   2000-2015 [employer] [address], [place] [address]. All rights reserved. This information is not intended as a substitute for professional medical care. Always follow your healthcare professional's instructions.

## 2013-12-03 NOTE — Progress Notes (Signed)
Referral to nutritionist placed.  Sallye Ober,  RN  [place]

## 2013-12-03 NOTE — Progress Notes (Signed)
12/03/2013    Seen in consultation at the request of:      Dear Dr. Elizebeth Brooking,      Thank you for trusting me with the care of [facility].     Please feel free to call me with any questions (office: [phone]).      Sincerely,                 Kirke Shaggy, MD         CONSULTATION      PATIENT:  Caitlin Wiley  DOB:  [birth year]  MRN:  [id number]      CHIEF COMPLAINT/HPI:  Patient is a 63 year old female who presented with left ear eczema for several years.  Symptoms are mild-  moderate, continuous, and unchanged.  Relevant history includes eczema on her hands.  She has a   dermatologist and prefers no steroid creams.  Associated symptoms include itching, intermittent tinnitus,   and squishy sound which improved.  She uses hand lotion on her hands and ear.  She has a history of BPPV.    She denies otalgia, otorrhea, and hearing loss.  The patient has no past history of otological surgery,   otological trauma, significant noise exposure, RAOM, and family history of hearing loss.      Prior testing:     Audiogram: 3 years ago    CT temporal bone:     MRI brain/internal auditory canals:        HEARING AID USE:  None      PMHX:  Past Medical History   Diagnosis Date   . Muscle cramps 04/2011   . Leukocytosis    . Osteoarthritis      chronic   . GERD (gastroesophageal reflux disease)    . Anxiety    . Cervical disc disease    . Pain of toe of right foot 02/2010     5th toe     PSHX:  Past Surgical History   Procedure Laterality Date   . Colonoscopy flx dx w/collj spec when pfrmd  11/2005   . Surgical hx other  08/1999     knee surgery     FHX:   Family History   Problem Relation Age of Onset   . Arthritis Mother    . Breast Cancer Mother      & tongue cancer   . Mental Illness Mother    . Stroke Mother      possible   . Hypertension Mother    . Anesthesia Problems Mother    . Arthritis Father    . Cancer Father    . Diabetes Father    . Colon Polyps Father    . Heart Disease Father      torn valve 01/1999   .  Hypertension Father    . Stroke Father    . Asthma       and/or allergies - grandfather   . Cancer       grandparents   . Heart Disease Maternal Grandfather    . Heart Disease Paternal Grandfather    . Tuberculosis       great-grandmother   . Arthritis       grandmother   . Breast Cancer       grandmother   . Mental Illness Brother    . Lupus Aunt/Uncle      great-aunt     SHX:  History     Social History   .  Marital Status: N/A     Spouse Name: N/A     Number of Children: N/A   . Years of Education: N/A     Occupational History   . Not on file.     Social History Main Topics   . Smoking status: Never Smoker    . Smokeless tobacco: Never Used   . Alcohol Use: Yes   . Drug Use: Not on file   . Sexual Activity: Not on file     Other Topics Concern   . 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 Rare exercise.         DXA completed on 07/31/12, resulting in normal bone density of the lumbar spine, and normal bone density at the L hip          Mammogram- she does get screening mammograms regularly         Colonoscopy performed on 04/17/10 with Dr. Estill Bamberg, resulting in tubular adenoma polyps, cleared for three years        Stress echo completed on 09/11/12 with Dr. Ivan Croft, resulting in scattered PVC, hypertensive exercise response and limited exercise duration. This is a low risk study.         TDAP- 03/13/10        Ophthalmology visit-??       ALLERGIES:  is allergic to bee venom; cats; ciprofloxacin; dust mite extract; pollen extract; and sulfa antibiotics.    MEDICATIONS:  has a current medication list which includes the following prescription(s): albuterol sulfate hfa, ascorbic acid, aspirin, calcium, clonazepam, clotrimazole-betamethasone, fish oil, flaxseed oil, fluticasone propionate  hfa, fluticasone propionate, glucosamine-chondroitin, lansoprazole, magnesium, montelukast sodium, multiple vitamins-minerals, probiotic product, ranitidine hcl, sotalol  hcl, triamterene-hctz, and warfarin sodium.      REVIEW OF SYSTEMS:    Constitutional: denies fatigue, fever, chills, sweats   Eye: negative  ENT: + itchy ear, eczema AS, tinnitus.  Denies excessive cerumen, fullness in ears, infections, otalgia, noise exposure, Nose/Sinus: anosmia/hyposmia, nasal drainage, epistaxis, facial pain, nasal congestion, nasal obstruction, rhinorrhea, sinusitis, sneezing, Throat: taste change, voice change, hoarseness, dysphagia, lump in throat, odynophagia, post-nasal drainage, sore tongue, mouth sores, pharyngitis, snoring, tooth pain   Cardiovascular: negative   Respiratory:  negative  Gastrointestinal:  Negative  Endocrine:  negative    Neurological: negative  Psychiatric:  negative   Integumentary: + eczema  Musculoskeletal: negative   Hematologic: negative  Immunologic: no known environmental allergies       PHYSICAL EXAMINATION:  Filed Vitals:    12/03/13 0946   BP: 120/80   Height: 5' 5.75" (1.67 m)   Weight: 320 lb (145.151 kg)        General: Well developed, appearing stated age and in no acute distress.  Participated in the patient interview appropriately.  Eyes: Clear conjunctive, normal lids. Pupils equal, round and reactive to light.  Extraocular movements intact.  Ears: visualized by microscopy.    Right: Pinna unremarkable.  External ear canal without masses or skin abnormality. No cerumen. Tympanic membrane intact and mobile without retraction or perforation. Middle ear without fluid.  No hearing loss.     Left: Pinna with eczema between crus of helix and tragus.  External ear canal without masses or skin abnormality. + cerumen. Tympanic membrane intact and mobile without retraction or perforation. Middle ear without fluid. No hearing loss.      Nose:  Nasal mucosa  normal with clear mucus.  Midline septum with no polyps or lesions.  Mild turbinate hypertrophy.  OP: Clear without masses or ulcerations. Normal lips, teeth, and gums.  Normal hard palate, soft palate, and  posterior pharynx.  HP:  Clear without mucosal abnormalities or masses  Neck: Supple and without masses, midline trachea, normal thyroid without enlargement, tenderness, and masses.  Lymphatic: normal submandibular, cervical, and supraclavicular lymph nodes    Skin: No rashes   Neuro:  CN II-XII grossly intact (VII Movements of facial expression bilaterally intact and symmetrical)  Cardiovascular: Regular rate and rhythm, normal carotid pulses  Lungs:Clear to auscultation, normal effort  Extremities: + eczema on hands      Ear impacted Cerumen Removal Procedure Note:  Consent obtained. Procedure discussed. Questions answered. Correct patient identified.    Right:  Microscope used.  No cerumen.  Left:  Microscope used.  Cerumen impaction completely removed.    Removal method:  Curette    Patient tolerated procedure well.           IMPRESSION/PLAN: AS eczema, impacted cerumen   Routine cerumen removal  Trial Itch-X gel  May f/u with dermatologist for eczema  Patient declined hydrocortisone cream      Kirke Shaggy, MD      Cc:   Hagedorn, Ashok Norris (General)  Salley Slaughter, MD  [address] [place] [address]

## 2013-12-03 NOTE — Telephone Encounter (Signed)
Patient states that she thinks she is in A fib since this am around 11:00 am. Patient confirmed she is taking her medication, particularly her sotalolol and coumadin. Patient states her rate in high 90's to low 100s, BP is stable, and patient states she does not feel dizzy. Patient does not want a cardioversion today and will monitor how she is feeling and call back with an update tomorrow.     Routed to Dr. Ivan Croft as an Juluis Rainier.

## 2013-12-03 NOTE — Addendum Note (Signed)
Addended by: Shelba Flake on: 12/03/2013 03:43 PM     Modules accepted: Orders

## 2013-12-04 NOTE — Progress Notes (Signed)
Reviewed and agree.

## 2013-12-11 ENCOUNTER — Ambulatory Visit: Payer: No Typology Code available for payment source

## 2013-12-11 DIAGNOSIS — I4891 Unspecified atrial fibrillation: Secondary | ICD-10-CM | POA: Insufficient documentation

## 2013-12-11 LAB — PR PROTHROMBIN TIME, ONSITE: prothrombin INR: 2.1

## 2013-12-11 NOTE — Progress Notes (Signed)
continue  5mg  x 7 recheck in three weeks.

## 2013-12-19 ENCOUNTER — Ambulatory Visit: Payer: No Typology Code available for payment source | Admitting: Registered"

## 2013-12-19 VITALS — Wt 319.2 lb

## 2013-12-19 DIAGNOSIS — E7800 Pure hypercholesterolemia, unspecified: Secondary | ICD-10-CM

## 2013-12-19 DIAGNOSIS — I480 Paroxysmal atrial fibrillation: Secondary | ICD-10-CM

## 2013-12-19 NOTE — Progress Notes (Signed)
[facility]     Note type: New    Chief Complaint   Patient presents with   . Hyperlipidemia   . Weight Problem   . Arthritis       Direct time with patient: 60 mins        Assessment    (E78.0) Hypercholesterolemia  (primary encounter diagnosis)  (E66.01,  Z68.43) Morbid obesity with BMI of 50.0-59.9, adult  (I48.0) Paroxysmal atrial fibrillation    Wt 319 lb 3.2 oz (144.788 kg)    Body mass index is 51.92 kg/(m^2).      Results for orders placed or performed in visit on 10/03/13   COMPREHENSIVE METABOLIC PANEL   Result Value Ref Range    Sodium 134 (L) 135 - 145 mEq/L    Potassium 3.3 (L) 3.6 - 5.2 mEq/L    Chloride 102 98 - 108 mEq/L    Carbon Dioxide, Total 23 22 - 32 mEq/L    Anion Gap 9 4 - 12    Glucose 102 62 - 125 mg/dL    Urea Nitrogen 26 (H) 8 - 21 mg/dL    Creatinine 0.96 0.38 - 1.02 mg/dL    Protein (Total) 6.3 6.0 - 8.2 g/dL    Albumin 3.9 3.5 - 5.2 g/dL    Bilirubin (Total) 0.6 0.2 - 1.3 mg/dL    Calcium 8.8 (L) 8.9 - 10.2 mg/dL    AST (GOT) 18 9 - 38 U/L    Alkaline Phosphatase (Total) 76 31 - 132 U/L    ALT (GPT) 19 7 - 33 U/L    GFR, Calc, European American 59 (L) >59 mL/min    GFR, Calc, African American >60 >59 mL/min    GFR, Information       Calculated GFR in mL/min/1.73 m2 by MDRD equation.  Inaccurate with changing renal function.  See http://depts.[email or web address].html        Results for orders placed or performed in visit on 07/31/12   LIPID PANEL   Result Value Ref Range    Cholesterol (Total) 201 (H) <200 mg/dL    Triglyceride 111 <150 mg/dL    Cholesterol (HDL) 60 >40 mg/dL    Cholesterol (LDL) 119 <130 mg/dL    Non-HDL Cholesterol 141 0 - 159 mg/dL    Cholesterol/HDL Ratio 3.4     Lipid Panel, Additional Info.         (NOTE)  For complete information to help interpret the lipid   panel, please   use the National Cholesterol Education Program (NCEP)   guideline based   reference range comments found here:  http://web.labmed.SeekArcade.nl          Results for orders placed or performed in visit on 09/25/13   THYROID STIMULATING HORMONE   Result Value Ref Range    Thyroid Stimulating Hormone 3.083 0.400 - 5.000 uIU/mL       Current Outpatient Prescriptions   Medication Sig Dispense Refill   . Albuterol Sulfate HFA (PROAIR HFA) 108 (90 BASE) MCG/ACT Inhalation Aero Soln Inhale 1-2 puffs by mouth 4 times a day as needed. 1 Inhaler 2   . Ascorbic Acid (VITAMIN C OR) 500 mg     . ASPIRIN OR 81 mg     . CALCIUM OR 600 mg     . CLONAZEPAM OR 0.5 mg q 12 h prn anxiety     . Clotrimazole-Betamethasone 1-0.05 % External Cream APPLY TO AFFECTED AREA(S) TWO TIMES DAILY 30 g 0   . FISH OIL  1000  mg     . Flaxseed, Linseed, (FLAXSEED OIL) 1200 MG Oral Cap None Entered     . Fluticasone Propionate  HFA (FLOVENT HFA) 110 MCG/ACT Inhalation Aerosol INHALE TWO PUFFS BY MOUTH TWICE DAILY - RINSE MOUTH AFTER USE 1 Inhaler 3   . Fluticasone Propionate 50 MCG/ACT Nasal Suspension Spray 2 sprays into each nostril 2 times a day. 1 Inhaler 3   . GLUCOSAMINE CHONDROITIN COMPLX OR      . Lansoprazole 30 MG Oral CAPSULE DELAYED RELEASE Take 1 capsule (30 mg) by mouth daily. 32 capsule 6   . Magnesium 400 MG Oral Cap Take 400 mg by mouth.     . Montelukast Sodium 10 MG Oral Tab Take 1 tablet (10 mg) by mouth daily. 30 tablet 3   . Multiple Vitamins-Minerals (MULTIVITAMIN OR) no iron     . Probiotic Product (PROBIOTIC DAILY OR)      . Ranitidine HCl 150 MG Oral Tab Take 1 tablet (150 mg) by mouth at bedtime. 30 tablet 3   . Sotalol HCl 80 MG Oral Tab Take 1 tablet (80 mg) by mouth 2 times a day. 60 tablet 12   . Triamterene-HCTZ 37.5-25 MG Oral Tab Take 1 tablet by mouth daily. 34 tablet 6   . Warfarin Sodium (COUMADIN) 5 MG Oral Tab Take 1 tablet (5 mg) by mouth daily. 30 tablet 3     No current facility-administered medications for this visit.       Vitamins/Minerals/Herbal/Supplements: Tumeric, magnesium, multivitamin, glucosamine, chondroitin, flax oil, vitamin D, fiber  supplement, Emergen-C, probiotic        History   Substance Use Topics   . Smoking status: Never Smoker    . Smokeless tobacco: Never Used   . Alcohol Use: Yes       Family History   Problem Relation Age of Onset   . Arthritis Mother    . Breast Cancer Mother      & tongue cancer   . Mental Illness Mother    . Stroke Mother      possible   . Hypertension Mother    . Anesthesia Problems Mother    . Arthritis Father    . Cancer Father    . Diabetes Father    . Colon Polyps Father    . Heart Disease Father      torn valve 01/1999   . Hypertension Father    . Stroke Father    . Asthma       and/or allergies - grandfather   . Cancer       grandparents   . Heart Disease Maternal Grandfather    . Heart Disease Paternal Grandfather    . Tuberculosis       great-grandmother   . Arthritis       grandmother   . Breast Cancer       grandmother   . Mental Illness Brother    . Lupus Aunt/Uncle      great-aunt       Activity/Exercise:  Not much    Diet History:   Food intolerance/allergies: possible sensitivity to sulfites, more joint pain with potato and tomatoes  Diet recall: Reviewed with patient     Information from Patient: Says she needs to lose weight, but current concerns include her excema/dermatitis, yeast infections and arthritis.  Denies hyperlipidemia.  On warfarin.  Says physician wants her to lose weight.        Nutrition Diagnosis: Food and nut related  knowledge deficit  related to various health concerns and weight evidenced by comments      Intervention  Medical Nutrition Therapy: for various health concerns.  Discussed patient's concerns and goals.  May need dermatology consult for excema/dermatitis to help determine cause.  Arthritis seems responsive to nightshade family foods.  Discussed also avoiding bell pepper and egg plant.  Instructed on including anti-inflammatory foods in her diet.  Has lots of issues today besides weight control.  Discussed various questions and concerns.  Encourage portion control and  consuming more vegetables as able.  Plan to begin work on weight control at follow up.    Patient stated goals: Weight loss, control excema  Nutrition topics taught: Diet:   anti-inflammatory  Education Materials Provided: As appropriate to medical nutrition therapy    Monitoring/Evaluation: weigh at clinic visits     Plan/Recommendations: Implement some of the changes discussed.  Plan to begin work on weight control.     Follow up: In 1 month(s)      Ishmael Holter, RDN, CD, CDE  Registered Dietitian/Nutritionist  Certified Diabetes Educator

## 2013-12-24 ENCOUNTER — Encounter (HOSPITAL_BASED_OUTPATIENT_CLINIC_OR_DEPARTMENT_OTHER): Payer: No Typology Code available for payment source

## 2013-12-24 ENCOUNTER — Telehealth (HOSPITAL_BASED_OUTPATIENT_CLINIC_OR_DEPARTMENT_OTHER): Payer: Self-pay

## 2013-12-24 NOTE — Telephone Encounter (Signed)
==========  [phone]=================  Mon 07-Dec-15 09:04a  ======================================  [facility]  PROVIDER & O/C: [facility]           CALLER: MASIYAH JORSTAD               A/C & #: [phone]              PATIENT NAME: SAME                               DOB: August 23, 2050                             RE: IN A FIB SINCE          THURSDAY NIGHT ON AND OFF BUT NOW       ON STRAIGHT FOR 24 HOURS                ------- 12/23/2013 04:57P MA  --------  [facility]    --------------------------------------  Message History  Account: [id number]  Taken:  Nancy Fetter 23-Dec-2013  4:56p MA  Serial#: [id number]  Delivered To:    --------------------------------------  Msg Dispatch History  Acct: [id number]  #01:  Sun 23-Dec-2013  3:21p RS  Pending [phone] Alpha Page    #02:  Nancy Fetter 23-Dec-2013  3:22p RS  Alpha Paged [phone]    #03:  Nancy Fetter 23-Dec-2013  4:56p MA  Dialout [phone]    ======================================  [address] [facility]  PROVIDER & O/C: PA[facility]Z/[facility]LEY                   CALLER: Vikki Ports               A/C & #: [phone]              PATIENT NAME: SAME                               DOB: August 23, 2050                             RE: IN A FIB SINCE          THURSDAY NIGHT ON AND OFF BUT NOW       ON STRAIGHT FOR 24 HOURS                  --------------------------------------  Message History  Account: [id number]  Taken:  Sun 23-Dec-2013  3:21p RS  Serial#: [id number]  Delivered To:    --------------------------------------  Msg Dispatch History  Acct: [id number]  #01:  Nancy Fetter 23-Dec-2013  3:21p RS  Pending [phone] Alpha Page    #02:  Nancy Fetter 23-Dec-2013  3:22p RS  Alpha Paged [phone]    #03:  Nancy Fetter 23-Dec-2013  4:56p MA  Dialout [phone]    I attempted to call back patient at both and cell number which were both busy.    Fransico Michael  [facility]  [facility]

## 2014-01-01 ENCOUNTER — Ambulatory Visit: Payer: No Typology Code available for payment source

## 2014-01-01 DIAGNOSIS — I480 Paroxysmal atrial fibrillation: Secondary | ICD-10-CM | POA: Insufficient documentation

## 2014-01-01 LAB — PR PROTHROMBIN TIME, ONSITE: prothrombin INR: 2

## 2014-01-01 NOTE — Patient Instructions (Signed)
continue  5mg  x 7 recheck in four weeks.

## 2014-01-01 NOTE — Progress Notes (Signed)
continue  5mg  x 7 recheck in four weeks.

## 2014-01-14 ENCOUNTER — Other Ambulatory Visit (INDEPENDENT_AMBULATORY_CARE_PROVIDER_SITE_OTHER): Payer: Self-pay

## 2014-01-14 DIAGNOSIS — J3089 Other allergic rhinitis: Secondary | ICD-10-CM

## 2014-01-14 DIAGNOSIS — R0602 Shortness of breath: Secondary | ICD-10-CM

## 2014-01-14 MED ORDER — FLUTICASONE PROPIONATE 50 MCG/ACT NA SUSP
2.0000 | Freq: Every day | NASAL | Status: DC
Start: 2014-01-14 — End: 2017-08-23

## 2014-01-14 MED ORDER — ALBUTEROL SULFATE HFA 108 (90 BASE) MCG/ACT IN AERS
1.0000 | INHALATION_SPRAY | Freq: Four times a day (QID) | RESPIRATORY_TRACT | Status: DC | PRN
Start: 2014-01-14 — End: 2014-07-14

## 2014-01-14 NOTE — Telephone Encounter (Signed)
Refill Request    Last visit with PCP:  11/2013: hearing loss, L ear     No PX in Epic  Next visit: none  Labs: na    Last refill:  07/2013    09/2013  Letter Sent: YES - pt needs px  Diagnosis: Unspecified asthma

## 2014-01-19 ENCOUNTER — Other Ambulatory Visit: Payer: Self-pay

## 2014-01-20 ENCOUNTER — Encounter (INDEPENDENT_AMBULATORY_CARE_PROVIDER_SITE_OTHER): Payer: Self-pay

## 2014-01-21 NOTE — Telephone Encounter (Signed)
Flu shot and mammogram documented in health maintenance.

## 2014-01-23 ENCOUNTER — Ambulatory Visit: Payer: No Typology Code available for payment source | Admitting: Registered"

## 2014-01-23 VITALS — Wt 309.2 lb

## 2014-01-23 DIAGNOSIS — E7800 Pure hypercholesterolemia, unspecified: Secondary | ICD-10-CM

## 2014-01-23 NOTE — Progress Notes (Signed)
[facility]     Note type:  Reassessment    Chief Complaint   Patient presents with   . Hyperlipidemia   . Blood Pressure   . Weight Problem       Direct time with patient: 30 mins        Assessment    (E78.0) Hypercholesterolemia  (primary encounter diagnosis)  (E66.01,  Z68.43) Morbid obesity with BMI of 50.0-59.9, adult  (I48.0) Paroxysmal atrial fibrillation    Wt 309 lb 3.2 oz (140.252 kg)    Body mass index is 50.29 kg/(m^2).      Results for orders placed or performed in visit on 10/03/13   COMPREHENSIVE METABOLIC PANEL   Result Value Ref Range    Sodium 134 (L) 135 - 145 mEq/L    Potassium 3.3 (L) 3.6 - 5.2 mEq/L    Chloride 102 98 - 108 mEq/L    Carbon Dioxide, Total 23 22 - 32 mEq/L    Anion Gap 9 4 - 12    Glucose 102 62 - 125 mg/dL    Urea Nitrogen 26 (H) 8 - 21 mg/dL    Creatinine 0.96 0.38 - 1.02 mg/dL    Protein (Total) 6.3 6.0 - 8.2 g/dL    Albumin 3.9 3.5 - 5.2 g/dL    Bilirubin (Total) 0.6 0.2 - 1.3 mg/dL    Calcium 8.8 (L) 8.9 - 10.2 mg/dL    AST (GOT) 18 9 - 38 U/L    Alkaline Phosphatase (Total) 76 31 - 132 U/L    ALT (GPT) 19 7 - 33 U/L    GFR, Calc, European American 59 (L) >59 mL/min    GFR, Calc, African American >60 >59 mL/min    GFR, Information       Calculated GFR in mL/min/1.73 m2 by MDRD equation.  Inaccurate with changing renal function.  See http://depts.[email or web address]        Results for orders placed or performed in visit on 07/31/12   LIPID PANEL   Result Value Ref Range    Cholesterol (Total) 201 (H) <200 mg/dL    Triglyceride 111 <150 mg/dL    Cholesterol (HDL) 60 >40 mg/dL    Cholesterol (LDL) 119 <130 mg/dL    Non-HDL Cholesterol 141 0 - 159 mg/dL    Cholesterol/HDL Ratio 3.4     Lipid Panel, Additional Info.         (NOTE)  For complete information to help interpret the lipid   panel, please   use the National Cholesterol Education Program (NCEP)   guideline based   reference range comments found  here:  http://web.labmed.[employer]ekArc[employer].nl       Results for orders placed or performed in visit on 09/25/13   THYROID STIMULATING HORMONE   Result Value Ref Range    Thyroid Stimulating Hormone 3.083 0.400 - 5.000 uIU/mL       Current Outpatient Prescriptions   Medication Sig Dispense Refill   . Albuterol Sulfate HFA (PROAIR HFA) 108 (90 BASE) MCG/ACT Inhalation Aero Soln Inhale 1-2 puffs by mouth 4 times a day as needed (SOB). 1 Inhaler 1   . Ascorbic Acid (VITAMIN C OR) 500 mg     . ASPIRIN OR 81 mg     . CALCIUM OR 600 mg     . CLONAZEPAM OR 0.5 mg q 12 h prn anxiety     . Clotrimazole-Betamethasone 1-0.05 % External Cream APPLY TO AFFECTED AREA(S) TWO TIMES DAILY 30 g 0   . FISH OIL  1000 mg     . Flaxseed, Linseed, (FLAXSEED OIL) 1200 MG Oral Cap None Entered     . Fluticasone Propionate  HFA (FLOVENT HFA) 110 MCG/ACT Inhalation Aerosol INHALE TWO PUFFS BY MOUTH TWICE DAILY - RINSE MOUTH AFTER USE 1 Inhaler 3   . Fluticasone Propionate 50 MCG/ACT Nasal Suspension Spray 2 sprays into each nostril daily. 1 Inhaler 3   . GLUCOSAMINE CHONDROITIN COMPLX OR      . Lansoprazole 30 MG Oral CAPSULE DELAYED RELEASE Take 1 capsule (30 mg) by mouth daily. 32 capsule 6   . Magnesium 400 MG Oral Cap Take 400 mg by mouth.     . Montelukast Sodium 10 MG Oral Tab Take 1 tablet (10 mg) by mouth daily. 30 tablet 3   . Multiple Vitamins-Minerals (MULTIVITAMIN OR) no iron     . Probiotic Product (PROBIOTIC DAILY OR)      . Ranitidine HCl 150 MG Oral Tab Take 1 tablet (150 mg) by mouth at bedtime. 30 tablet 3   . Sotalol HCl 80 MG Oral Tab Take 1 tablet (80 mg) by mouth 2 times a day. 60 tablet 12   . Triamterene-HCTZ 37.5-25 MG Oral Tab Take 1 tablet by mouth daily. 34 tablet 6   . Warfarin Sodium (COUMADIN) 5 MG Oral Tab Take 1 tablet (5 mg) by mouth daily. 30 tablet 3     No current facility-administered medications for this visit.       Vitamins/Minerals/Herbal/Supplements: Tumeric, magnesium, multivitamin,  glucosamine, chondroitin, flax oil, vitamin D, fiber supplement, Emergen-C, probiotic        History   Substance Use Topics   . Smoking status: Never Smoker    . Smokeless tobacco: Never Used   . Alcohol Use: Yes       Family History   Problem Relation Age of Onset   . Arthritis Mother    . Breast Cancer Mother      & tongue cancer   . Mental Illness Mother    . Stroke Mother      possible   . Hypertension Mother    . Anesthesia Problems Mother    . Arthritis Father    . Cancer Father    . Diabetes Father    . Colon Polyps Father    . Heart Disease Father      torn valve 01/1999   . Hypertension Father    . Stroke Father    . Asthma       and/or allergies - grandfather   . Cancer       grandparents   . Heart Disease Maternal Grandfather    . Heart Disease Paternal Grandfather    . Tuberculosis       great-grandmother   . Arthritis       grandmother   . Breast Cancer       grandmother   . Mental Illness Brother    . Lupus Aunt/Uncle      great-aunt       Activity/Exercise:  Not much    Diet History:   Food intolerance/allergies: possible sensitivity to sulfites, more joint pain with potato and tomatoes  Diet recall: Reviewed with patient     Information from Patient: Pleased with weight loss.  Says she started using a smaller plate and practicing portion control.  Feels her smaller serving sizes have helped.  Started taking vitamin D.  Says she felt better within a day.  Notices having more energy and no longer  has seasonal depression with the grey weather.  With feeling better, she notices she is more active.  Eating vegetables has been the hardest.  Has been using her vitamin K content list as a guide to help select foods lower in vitamin K.  Has INR scheduled for next week.  Notes that when she eats chocolate she seems to get a fib symptoms.        Nutrition Diagnosis: Food and nut related knowledge deficit  related to various health concerns and weight evidenced by comments      Intervention  Medical Nutrition  Therapy: for weight control to improve heart health and possibly control atrial fibrillation.  Discussed patient's changes and challenges.  Commended her for her 10 pound weight loss during the past month.  She has accomplished this through portion control and better balancing her plate.  Discussed her challenges and possible ideas to work with them.  See MD for 25 [place] vitamin D testing.  Continue to increase physical activity as tolerated.  Walk as able.      Patient stated goals: Weight loss, control excema  Nutrition topics taught: Diet:   anti-inflammatory, vitamin K content of foods, weight control tips  Education Materials Provided: As appropriate to medical nutrition therapy    Monitoring/Evaluation: weigh at clinic visits     Plan/Recommendations: Implement some of the changes discussed.  Plan to continue work on weight control.     Follow up: In 1 month(s)      Ishmael Holter, RDN, CD, CDE  Registered Dietitian/Nutritionist  Certified Diabetes Educator

## 2014-02-01 ENCOUNTER — Ambulatory Visit: Payer: No Typology Code available for payment source

## 2014-02-01 DIAGNOSIS — I4891 Unspecified atrial fibrillation: Secondary | ICD-10-CM | POA: Insufficient documentation

## 2014-02-01 LAB — PR PROTHROMBIN TIME, ONSITE: prothrombin INR: 2.2

## 2014-02-01 NOTE — Progress Notes (Signed)
Continue  5mg  x 7 recheck in four weeks.     Reviewed per MD protocol.    Hayden Rasmussen, RN

## 2014-02-18 ENCOUNTER — Ambulatory Visit (INDEPENDENT_AMBULATORY_CARE_PROVIDER_SITE_OTHER): Payer: No Typology Code available for payment source

## 2014-02-18 ENCOUNTER — Encounter (INDEPENDENT_AMBULATORY_CARE_PROVIDER_SITE_OTHER): Payer: Self-pay

## 2014-02-18 VITALS — BP 135/76 | HR 60 | Ht 66.5 in | Wt 311.0 lb

## 2014-02-18 DIAGNOSIS — J0101 Acute recurrent maxillary sinusitis: Secondary | ICD-10-CM

## 2014-02-18 DIAGNOSIS — R51 Headache: Secondary | ICD-10-CM

## 2014-02-18 DIAGNOSIS — R7989 Other specified abnormal findings of blood chemistry: Secondary | ICD-10-CM

## 2014-02-18 DIAGNOSIS — R220 Localized swelling, mass and lump, head: Secondary | ICD-10-CM

## 2014-02-18 DIAGNOSIS — E559 Vitamin D deficiency, unspecified: Secondary | ICD-10-CM

## 2014-02-18 DIAGNOSIS — H6504 Acute serous otitis media, recurrent, right ear: Secondary | ICD-10-CM

## 2014-02-18 DIAGNOSIS — R519 Headache, unspecified: Secondary | ICD-10-CM

## 2014-02-18 DIAGNOSIS — I10 Essential (primary) hypertension: Secondary | ICD-10-CM

## 2014-02-18 LAB — COMPREHENSIVE METABOLIC PANEL
ALT (GPT): 13 U/L (ref 7–33)
AST (GOT): 16 U/L (ref 9–38)
Albumin: 4.1 g/dL (ref 3.5–5.2)
Alkaline Phosphatase (Total): 68 U/L (ref 31–132)
Anion Gap: 6 (ref 4–12)
Bilirubin (Total): 0.5 mg/dL (ref 0.2–1.3)
Calcium: 9.2 mg/dL (ref 8.9–10.2)
Carbon Dioxide, Total: 27 mEq/L (ref 22–32)
Chloride: 104 mEq/L (ref 98–108)
Creatinine: 0.82 mg/dL (ref 0.38–1.02)
GFR, Calc, African American: >60 mL/min (ref >59)
GFR, Calc, European American: >60 mL/min (ref >59)
Glucose: 101 mg/dL (ref 62–125)
Potassium: 4.1 mEq/L (ref 3.6–5.2)
Protein (Total): 6.7 g/dL (ref 6.0–8.2)
Sodium: 137 mEq/L (ref 135–145)
Urea Nitrogen: 15 mg/dL (ref 8–21)

## 2014-02-18 LAB — CBC, DIFF
% Basophils: 0 %
% Eosinophils: 3 %
% Immature Granulocytes: 0 %
% Lymphocytes: 26 %
% Monocytes: 5 %
% Neutrophils: 66 %
Absolute Eosinophil Count: 0.3 10*3/uL (ref 0.00–0.50)
Absolute Lymphocyte Count: 2.96 10*3/uL (ref 1.00–4.80)
Basophils: 0.05 10*3/uL (ref 0.00–0.20)
Hematocrit: 44 % (ref 36–45)
Hemoglobin: 14.3 g/dL (ref 11.5–15.5)
Immature Granulocytes: 0.04 10*3/uL (ref 0.00–0.05)
MCH: 26 pg — ABNORMAL LOW (ref 27.3–33.6)
MCHC: 32.3 g/dL (ref 32.2–36.5)
MCV: 80 fL — ABNORMAL LOW (ref 81–98)
Monocytes: 0.54 10*3/uL (ref 0.00–0.80)
Neutrophils: 7.61 10*3/uL — ABNORMAL HIGH (ref 1.80–7.00)
Platelet Count: 300 10*3/uL (ref 150–400)
RBC: 5.51 mil/uL — ABNORMAL HIGH (ref 3.80–5.00)
RDW-CV: 16.4 % — ABNORMAL HIGH (ref 11.6–14.4)
WBC: 11.5 10*3/uL — ABNORMAL HIGH (ref 4.3–10.0)

## 2014-02-18 LAB — C_REACTIVE PROTEIN: C_Reactive Protein: 10.8 mg/L — ABNORMAL HIGH (ref 0.0–10.0)

## 2014-02-18 LAB — SED RATE: Erythrocyte Sedimentation Rate: 9 mm/hr (ref 0–20)

## 2014-02-18 MED ORDER — AZITHROMYCIN 250 MG OR TABS
ORAL_TABLET | ORAL | Status: DC
Start: 2014-02-18 — End: 2014-05-21

## 2014-02-18 NOTE — Progress Notes (Signed)
DATE: 02/18/2014     PHYSICIAN NOTE:  Patient Name: Caitlin Wiley  Medical Record Number: [id number]  Visit Date/time: 02/18/2014     Date of Birth: 1950/04/17    CHIEF COMPLAINT:    Caitlin Wiley is a 64 year old female who is here today for review of multiple medical problems.    PROBLEMS TO BE ADDRESSED ON TODAYS VISIT:  (R51) Headache, unspecified headache type  (primary encounter diagnosis)  (J01.01) Acute recurrent maxillary sinusitis  (H65.04) Recurrent acute serous otitis media of right ear  (E55.9) Low vitamin D level  (I10) Essential hypertension  (R22.0) Cheek mass, right    HPI:  History provided by patient.    Caitlin Wiley is a 64 year old female who is here today for review of multiple medical problems. The patient reports headaches to the R temple region, onset Thursday afternoon. The headache is describes as a "pulsating" sensation, which at maximum is rated as a 3/10 on the pain scale. The headaches are intermittent, and last up to twenty seconds before spontaneously resolving. She has experienced two headache episodes today, though she states that they were not as severe compared to prior episodes. Denies pain that is exacerbated upon palpation.     The patient also reports some sinus pressure under the R orbit, which began recently. She also states that her "tears from the R eye seem thicker" as of recently. Denies having her R eyelids sticking together. She is currently taking Singulair.     She does report a R cheek mass, which she mentioned during her visit with myself on 11/29/13. I did provide her with a referral to Dr. Rexene Edison of general surgery at the time.     The patient has a history of a low vitamin D level, and she is requesting for a vitamin D level to be drawn today.      PROBLEM LIST:  Patient Active Problem List   Diagnosis   . Asthma   . Hypercholesterolemia   . Hx of colonic polyps--Dr Read--03/21/10  cleared for 5 years   . Edema   . Sleep apnea   . HTN  (hypertension)   . Menopausal state   . Rash   . Pulmonary nodule   . Grief reaction   . Strain of thoracic spine   . OA (osteoarthritis)   . GERD (gastroesophageal reflux disease)   . Palpitations       SURGICAL HISTORY:  Past Surgical History   Procedure Laterality Date   . Colonoscopy flx dx w/collj spec when pfrmd  11/2005   . Surgical hx other  08/1999     knee surgery       MEDICATIONS:  Current Outpatient Prescriptions   Medication Sig Dispense Refill   . Albuterol Sulfate HFA (PROAIR HFA) 108 (90 BASE) MCG/ACT Inhalation Aero Soln Inhale 1-2 puffs by mouth 4 times a day as needed (SOB). 1 Inhaler 1   . Ascorbic Acid (VITAMIN C OR) 500 mg     . ASPIRIN OR 81 mg     . Azithromycin 250 MG Oral Tab Take 2 tablets by mouth today, then take 1 tablet daily for 4 more days for infection, until all medication is taken. 6 tablet 0   . CALCIUM OR 600 mg     . CLONAZEPAM OR 0.5 mg q 12 h prn anxiety     . Clotrimazole-Betamethasone 1-0.05 % External Cream APPLY TO AFFECTED AREA(S) TWO TIMES DAILY 30 g 0   .  FISH OIL  1000 mg     . Flaxseed, Linseed, (FLAXSEED OIL) 1200 MG Oral Cap None Entered     . Fluticasone Propionate  HFA (FLOVENT HFA) 110 MCG/ACT Inhalation Aerosol INHALE TWO PUFFS BY MOUTH TWICE DAILY - RINSE MOUTH AFTER USE 1 Inhaler 3   . Fluticasone Propionate 50 MCG/ACT Nasal Suspension Spray 2 sprays into each nostril daily. 1 Inhaler 3   . GLUCOSAMINE CHONDROITIN COMPLX OR      . Lansoprazole 30 MG Oral CAPSULE DELAYED RELEASE Take 1 capsule (30 mg) by mouth daily. 32 capsule 6   . Magnesium 400 MG Oral Cap Take 400 mg by mouth.     . Montelukast Sodium 10 MG Oral Tab Take 1 tablet (10 mg) by mouth daily. 30 tablet 3   . Multiple Vitamins-Minerals (MULTIVITAMIN OR) no iron     . Probiotic Product (PROBIOTIC DAILY OR)      . Ranitidine HCl 150 MG Oral Tab Take 1 tablet (150 mg) by mouth at bedtime. 30 tablet 3   . Sotalol HCl 80 MG Oral Tab Take 1 tablet (80 mg) by mouth 2 times a day. 60 tablet 12   .  Triamterene-HCTZ 37.5-25 MG Oral Tab Take 1 tablet by mouth daily. 34 tablet 6   . Warfarin Sodium (COUMADIN) 5 MG Oral Tab Take 1 tablet (5 mg) by mouth daily. 30 tablet 3     No current facility-administered medications for this visit.       Georgina Snell MD, have reviewed the medication list in its entirety and agree with all contents.     ALLERGIES:  Review of patient's allergies indicates:  Allergies   Allergen Reactions   . Bee Venom    . Cats [Animals]    . Ciprofloxacin      Joint aching   . Dust Mite Extract      DUST   . Pollen Extract      POLLEN   . Sulfa Antibiotics        FAMILY HISTORY:  Family History   Problem Relation Age of Onset   . Arthritis Mother    . Breast Cancer Mother      & tongue cancer   . Mental Illness Mother    . Stroke Mother      possible   . Hypertension Mother    . Anesthesia Problems Mother    . Arthritis Father    . Cancer Father    . Diabetes Father    . Colon Polyps Father    . Heart Disease Father      torn valve 01/1999   . Hypertension Father    . Stroke Father    . Asthma       and/or allergies - grandfather   . Cancer       grandparents   . Heart Disease Maternal Grandfather    . Heart Disease Paternal Grandfather    . Tuberculosis       great-grandmother   . Arthritis       grandmother   . Breast Cancer       grandmother   . Mental Illness Brother    . Lupus Aunt/Uncle      great-aunt       SOCIAL HISTORY:  History     Social History   . Marital Status: N/A     Spouse Name: N/A     Number of Children: N/A   . Years of Education: N/A  Occupational History   . Not on file.     Social History Main Topics   . Smoking status: Never Smoker    . Smokeless tobacco: Never Used   . Alcohol Use: Yes   . Drug Use: Not on file   . Sexual Activity: Not on file     Other Topics Concern   . 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 Rare exercise.          REVIEW OF SYSTEMS:    CONSTITUTIONAL: Denies, fatigue,  unexpected weight loss, unexpected weight gain  NEUROLOGIC: Denies tremors. Pt reports headache   OPHTHALMIC: Denies, blurry vision, diplopia, eye pain. Pt reports "thicker tears from the R eye"  ENT: H/o R cheek mass. Pt reports R sinus pressure   RESPIRATORY: Denies, dyspnea on exertion, dyspnea at rest, cough, wheezing  CARDIOVASCULAR: H/o HTN  GI: Denies, change in appetite, dysphagia, nausea, vomiting, dyspepsia, abdominal pain, diarrhea, constipation, hematochezia, melena  BREAST: Denies, breast pain, breast nipple discharge, history of abnormal mammogram, history of breast mass  GENITOURINARY: Denies, dysuria, hematuria, urinary incontinence, urinary urgency, vaginal discharge, abnormal vaginal bleeding, pelvic pain  INTEGUMENTARY: H/o R cheek mass  RHEUMATOLOGIC/MSK: Denies, joint swelling, joint stiffness, any joint or arthritic problems  ENDOCRINE: H/o low vitamin D level  HEME/LYMPH:  Denies, Easy bruising, Lumps under arms or around neck and Anemia  PSYCHOSOCIAL: Denies and any psychological problems  HIV RISK: Denies, multiple sexual partners, history of a sexually transmitted disease, transfusion between 1978 and 0981 and use of illicit drugs by injection    PHYSICAL EXAM:  BP 135/76 mmHg  Pulse 60  Ht 5' 6.5" (1.689 m)  Wt 311 lb (141.069 kg)  BMI 49.45 kg/m2  Physical Examination-female: Vital Signs as noted above.    General Appearance and Mental Status: Well Developed/Well Nourished  [ ]  Slender  [ ]  Average  [ ]  Heavy [ x] Obese  [ x] Appropriate Affect, Oriented to Person, Place, Time    Skin: Warm, dry to touch. There is a less than 1 cm in size lesion to the R cheek which seems to have decreased in size since the previous visit.    Head: Normocephalic, atraumatic. Non-tender upon palpation.     Eyes: EOMI, PERLA    Ears: Slight erythema of the R TM. L TM is clear.     Nose: Septum intact, no lesions, pink mucosa and turbinates, no discharge    Pharynx: Clear    Neck: Supple, without JVD  or  goiter    Chest: Clear     CV: Heart: RRR, no murmur, no rub, no S3, S4 noted    Rectal:  deferred    Abdomen: Soft, bowel sounds present, without masses or tenderness noted, no hepatosplenomegaly noted.    Extrem: no edema    MSK: normal gait and station, no clubbing, no cyanosis;  upper and lower extremities show no defects in symmetry, crepitance, tenderness, masses, or effusions;  normal range of motion without dislocation, subluxation or laxity; normal muscle tone and strength.    Neuro: intact      LAB RESULTS:  pending    ASSESSMENT AND PLAN:  Pertinent Labs & Imaging studies reviewed. (See chart for details)  Prior EMR records reviewed in EPIC as available and clinically relevant.    1. Headache, unspecified headache type  The patient reports headaches to  the R temple region, onset Thursday afternoon. The headache is describes as a "pulsating" sensation, which at maximum is rated as a 3/10 on the pain scale. The headaches are intermittent, and last up to twenty seconds before spontaneously resolving. Denies pain that is exacerbated upon palpation. She is non-tender on examination today. A CRP and sed rate will be ordered to rule out temporal arteritis. She is to see Dr. Cameron Proud or associate of neurology, for further evaluation. The patient will let me know if her symptoms persist or worsen. The patient agrees, no further questions at this time.   - CRP, HIGH SENSITIVITY  - SED RATE  - REFERRAL TO NEUROLOGY, Dr. Cameron Proud or associate     2. Acute recurrent maxillary sinusitis  She reports R sinus pressure under the orbital region. I will treat her for sinusitis with a course of Azithromycin. She will let me know if her symptoms persist or worsen. The patient agrees, no further questions at this time.   - Azithromycin 250 MG Oral Tab; Take 2 tablets by mouth today, then take 1 tablet daily for 4 more days for infection, until all medication is taken.  Dispense: 6 tablet; Refill: 0    3. Recurrent acute  serous otitis media of right ear  The R TM is erythematous on examination. I will treat the patient for R otitis media with a course of Azithromycin.   - Azithromycin 250 MG Oral Tab; Take 2 tablets by mouth today, then take 1 tablet daily for 4 more days for infection, until all medication is taken.  Dispense: 6 tablet; Refill: 0    4. Low vitamin D level  She requests for a vitamin D level to be drawn, as she has a history of low vitamin D level:   - VITAMIN D (25 HYDROXY)    5. Essential hypertension  - COMPREHENSIVE METABOLIC PANEL  - CBC, DIFF    6. Cheek mass, right  She reports a R cheek mass. There is a less than 1 cm sized lesion to the R cheek. I already provided her with a referral to Dr. Rexene Edison of general surgery, and she can schedule an appointment with Dr. Rexene Edison for further evaluation.     Further treatment dependant on the results of the tests.    Seek immediate attention if symptoms are worsening.    Call if symptoms are not resolving with therapy.    Portions of this chart were written by Dineen Kid, Medical Scribe with oversight by Ricki Miller, MD 02/18/2014 3:28 PM     I, Ricki Miller, have reviewed the information recorded by the scribe for accuracy and agree with its contents.    This medical record has been electronically generated with voice recognition software. Every attempt is made to assure accuracy.   However. it may contain errors related to this process.

## 2014-02-18 NOTE — Progress Notes (Signed)
I, Ricki Miller, MD interviewed and examined the patient while overseeing the documentation performed by my Medical Scribe, Dineen Kid. I have reviewed and revised as necessary the scribe's note and agree with the documented findings and plan of care. Please refer to the scribe's note below for further detailed information regarding the patient encounter and exam.  02/18/2014. 6:15 PM.

## 2014-02-18 NOTE — Patient Instructions (Signed)
Seek immediate attention if symptoms are worsening.    Call if symptoms are not resolving with therapy.    A letter will be coming within the next two weeks reviewing the lab results.

## 2014-02-19 ENCOUNTER — Telehealth (HOSPITAL_BASED_OUTPATIENT_CLINIC_OR_DEPARTMENT_OTHER): Payer: Self-pay

## 2014-02-19 ENCOUNTER — Encounter (INDEPENDENT_AMBULATORY_CARE_PROVIDER_SITE_OTHER): Payer: Self-pay

## 2014-02-19 NOTE — Telephone Encounter (Signed)
Started new abx (azithromycin) we will check her INR Friday. Also discussed calling office for any new medications and sick days.

## 2014-02-20 ENCOUNTER — Ambulatory Visit: Payer: No Typology Code available for payment source | Admitting: Registered"

## 2014-02-20 VITALS — Wt 311.8 lb

## 2014-02-20 DIAGNOSIS — E7800 Pure hypercholesterolemia, unspecified: Secondary | ICD-10-CM

## 2014-02-20 LAB — VITAMIN D (25 HYDROXY)
Vit D (25_Hydroxy) Total: 40.3 ng/mL (ref 20.1–50.0)
Vitamin D2 (25_Hydroxy): 1.5 ng/mL
Vitamin D3 (25_Hydroxy): 38.8 ng/mL

## 2014-02-20 NOTE — Progress Notes (Signed)
[facility]     Note type:  Reassessment    Chief Complaint   Patient presents with   . Hyperlipidemia   . Weight Problem       Direct time with patient: 30 mins        Assessment    (E78.0) Hypercholesterolemia  (primary encounter diagnosis)  (E66.01,  Z68.43) Morbid obesity with BMI of 50.0-59.9, adult  (I48.0) Paroxysmal atrial fibrillation    Wt 311 lb 12.8 oz (141.432 kg)    Body mass index is 49.58 kg/(m^2).      Results for orders placed or performed in visit on 10/03/13   COMPREHENSIVE METABOLIC PANEL   Result Value Ref Range    Sodium 134 (L) 135 - 145 mEq/L    Potassium 3.3 (L) 3.6 - 5.2 mEq/L    Chloride 102 98 - 108 mEq/L    Carbon Dioxide, Total 23 22 - 32 mEq/L    Anion Gap 9 4 - 12    Glucose 102 62 - 125 mg/dL    Urea Nitrogen 26 (H) 8 - 21 mg/dL    Creatinine 0.96 0.38 - 1.02 mg/dL    Protein (Total) 6.3 6.0 - 8.2 g/dL    Albumin 3.9 3.5 - 5.2 g/dL    Bilirubin (Total) 0.6 0.2 - 1.3 mg/dL    Calcium 8.8 (L) 8.9 - 10.2 mg/dL    AST (GOT) 18 9 - 38 U/L    Alkaline Phosphatase (Total) 76 31 - 132 U/L    ALT (GPT) 19 7 - 33 U/L    GFR, Calc, European American 59 (L) >59 mL/min    GFR, Calc, African American >60 >59 mL/min    GFR, Information       Calculated GFR in mL/min/1.73 m2 by MDRD equation.  Inaccurate with changing renal function.  See http://[email or web address]        Results for orders placed or performed in visit on 07/31/12   LIPID PANEL   Result Value Ref Range    Cholesterol (Total) 201 (H) <200 mg/dL    Triglyceride 111 <150 mg/dL    Cholesterol (HDL) 60 >40 mg/dL    Cholesterol (LDL) 119 <130 mg/dL    Non-HDL Cholesterol 141 0 - 159 mg/dL    Cholesterol/HDL Ratio 3.4     Lipid Panel, Additional Info.         (NOTE)  For complete information to help interpret the lipid   panel, please   use the National Cholesterol Education Program (NCEP)   guideline based   reference range comments found here:  http://web.labmed.[employer].nl            Results for orders placed or performed in visit on 09/25/13   THYROID STIMULATING HORMONE   Result Value Ref Range    Thyroid Stimulating Hormone 3.083 0.400 - 5.000 uIU/mL       Current Outpatient Prescriptions   Medication Sig Dispense Refill   . Albuterol Sulfate HFA (PROAIR HFA) 108 (90 BASE) MCG/ACT Inhalation Aero Soln Inhale 1-2 puffs by mouth 4 times a day as needed (SOB). 1 Inhaler 1   . Ascorbic Acid (VITAMIN C OR) 500 mg     . ASPIRIN OR 81 mg     . Azithromycin 250 MG Oral Tab Take 2 tablets by mouth today, then take 1 tablet daily for 4 more days for infection, until all medication is taken. 6 tablet 0   . CALCIUM OR 600 mg     . CLONAZEPAM OR 0.5  mg q 12 h prn anxiety     . Clotrimazole-Betamethasone 1-0.05 % External Cream APPLY TO AFFECTED AREA(S) TWO TIMES DAILY 30 g 0   . FISH OIL  1000 mg     . Flaxseed, Linseed, (FLAXSEED OIL) 1200 MG Oral Cap None Entered     . Fluticasone Propionate  HFA (FLOVENT HFA) 110 MCG/ACT Inhalation Aerosol INHALE TWO PUFFS BY MOUTH TWICE DAILY - RINSE MOUTH AFTER USE 1 Inhaler 3   . Fluticasone Propionate 50 MCG/ACT Nasal Suspension Spray 2 sprays into each nostril daily. 1 Inhaler 3   . GLUCOSAMINE CHONDROITIN COMPLX OR      . Lansoprazole 30 MG Oral CAPSULE DELAYED RELEASE Take 1 capsule (30 mg) by mouth daily. 32 capsule 6   . Magnesium 400 MG Oral Cap Take 400 mg by mouth.     . Montelukast Sodium 10 MG Oral Tab Take 1 tablet (10 mg) by mouth daily. 30 tablet 3   . Multiple Vitamins-Minerals (MULTIVITAMIN OR) no iron     . Probiotic Product (PROBIOTIC DAILY OR)      . Ranitidine HCl 150 MG Oral Tab Take 1 tablet (150 mg) by mouth at bedtime. 30 tablet 3   . Sotalol HCl 80 MG Oral Tab Take 1 tablet (80 mg) by mouth 2 times a day. 60 tablet 12   . Triamterene-HCTZ 37.5-25 MG Oral Tab Take 1 tablet by mouth daily. 34 tablet 6   . Warfarin Sodium (COUMADIN) 5 MG Oral Tab Take 1 tablet (5 mg) by mouth daily. 30 tablet 3     No current facility-administered  medications for this visit.       Vitamins/Minerals/Herbal/Supplements: Tumeric, magnesium, multivitamin, glucosamine, chondroitin, flax oil, vitamin D, fiber supplement, Emergen-C, probiotic        History   Substance Use Topics   . Smoking status: Never Smoker    . Smokeless tobacco: Never Used   . Alcohol Use: Yes       Family History   Problem Relation Age of Onset   . Arthritis Mother    . Breast Cancer Mother      & tongue cancer   . Mental Illness Mother    . Stroke Mother      possible   . Hypertension Mother    . Anesthesia Problems Mother    . Arthritis Father    . Cancer Father    . Diabetes Father    . Colon Polyps Father    . Heart Disease Father      torn valve 01/1999   . Hypertension Father    . Stroke Father    . Asthma       and/or allergies - grandfather   . Cancer       grandparents   . Heart Disease Maternal Grandfather    . Heart Disease Paternal Grandfather    . Tuberculosis       great-grandmother   . Arthritis       grandmother   . Breast Cancer       grandmother   . Mental Illness Brother    . Lupus Aunt/Uncle      great-aunt       Activity/Exercise:  Not much    Diet History:   Food intolerance/allergies: possible sensitivity to sulfites, more joint pain with potato and tomatoes  Diet recall: Reviewed with patient     Information from Patient: Not able to be as active as she had hoped.  Has had  fewer food cravings.  Finds when she eats too much she feels full and uncomfortable.  Has been more selective about her food choices.  Is drinking Chai tea when she feels like snacking.  Says it has helped her control her intake.        Nutrition Diagnosis: Food and nut related knowledge deficit  related to various health concerns and weight evidenced by comments      Intervention  Medical Nutrition Therapy: for weight control to improve heart health and possibly control atrial fibrillation.  Discussed patient's changes and challenges. Discussed what was helping her be successful.  She continues to  work on portion control and better balancing her plate. Seems to be more aware of her feelings of fullness.  Has been trying to control her snacking.  Discussed her challenges and possible ideas to work with them.  Continue to increase physical activity as tolerated.  Walk as able.  Goal is to continue working on weight loss.      Patient stated goals: Weight loss, control excema  Nutrition topics taught: Diet:   anti-inflammatory, vitamin K content of foods, weight control tips  Education Materials Provided: As appropriate to medical nutrition therapy    Monitoring/Evaluation: weigh at clinic visits     Plan/Recommendations: Implement some of the changes discussed.  Plan to continue work on weight control.     Follow up: In 1 month(s)      Ishmael Holter, RDN, CD, CDE  Registered Dietitian/Nutritionist  Certified Diabetes Educator

## 2014-02-21 ENCOUNTER — Other Ambulatory Visit (INDEPENDENT_AMBULATORY_CARE_PROVIDER_SITE_OTHER): Payer: Self-pay

## 2014-02-21 DIAGNOSIS — J4520 Mild intermittent asthma, uncomplicated: Secondary | ICD-10-CM

## 2014-02-21 MED ORDER — MONTELUKAST SODIUM 10 MG OR TABS
10.0000 mg | ORAL_TABLET | Freq: Every day | ORAL | Status: DC
Start: 2014-02-21 — End: 2014-09-02

## 2014-02-21 NOTE — Telephone Encounter (Signed)
Refill Request    Last visit with PCP: 02/18/14  Next visit: none  Labs: 02/18/14    Last refill: 10/17/13 # 30 + 3  Letter Sent: NO  Diagnosis: Unspecified asthma(493.90)

## 2014-02-22 ENCOUNTER — Ambulatory Visit: Payer: No Typology Code available for payment source

## 2014-02-22 DIAGNOSIS — I4891 Unspecified atrial fibrillation: Secondary | ICD-10-CM | POA: Insufficient documentation

## 2014-02-22 LAB — PR PROTHROMBIN TIME, ONSITE: prothrombin INR: 1.9

## 2014-02-22 NOTE — Progress Notes (Signed)
Continue  5mg  x 7 recheck in three to four weeks.     Reviewed per MD protocol.    Hayden Rasmussen, RN

## 2014-02-22 NOTE — Progress Notes (Signed)
Reviewed and agree.

## 2014-02-28 ENCOUNTER — Ambulatory Visit: Payer: No Typology Code available for payment source

## 2014-02-28 ENCOUNTER — Encounter (HOSPITAL_BASED_OUTPATIENT_CLINIC_OR_DEPARTMENT_OTHER): Payer: No Typology Code available for payment source

## 2014-02-28 DIAGNOSIS — I4891 Unspecified atrial fibrillation: Secondary | ICD-10-CM | POA: Insufficient documentation

## 2014-02-28 LAB — PR PROTHROMBIN TIME, ONSITE: prothrombin INR: 1.8

## 2014-02-28 NOTE — Progress Notes (Signed)
Change dosing to 7.5 mg X 1 day a week and 5mg  X 6 days a week.       Recheck in 2 weeks.    Reviewed per MD protocol.    Hayden Rasmussen, RN

## 2014-03-06 ENCOUNTER — Ambulatory Visit (HOSPITAL_BASED_OUTPATIENT_CLINIC_OR_DEPARTMENT_OTHER): Payer: No Typology Code available for payment source

## 2014-03-11 ENCOUNTER — Ambulatory Visit (HOSPITAL_BASED_OUTPATIENT_CLINIC_OR_DEPARTMENT_OTHER): Payer: No Typology Code available for payment source

## 2014-03-11 ENCOUNTER — Ambulatory Visit: Payer: No Typology Code available for payment source

## 2014-03-11 ENCOUNTER — Other Ambulatory Visit (HOSPITAL_BASED_OUTPATIENT_CLINIC_OR_DEPARTMENT_OTHER): Payer: Self-pay

## 2014-03-11 VITALS — BP 122/68 | HR 88 | Resp 20 | Wt 314.0 lb

## 2014-03-11 DIAGNOSIS — I480 Paroxysmal atrial fibrillation: Secondary | ICD-10-CM

## 2014-03-11 DIAGNOSIS — I4891 Unspecified atrial fibrillation: Secondary | ICD-10-CM

## 2014-03-11 LAB — PR PROTHROMBIN TIME, ONSITE: prothrombin INR: 1.9

## 2014-03-11 MED ORDER — WARFARIN SODIUM 5 MG OR TABS
ORAL_TABLET | ORAL | Status: DC
Start: 2014-03-11 — End: 2014-07-01

## 2014-03-11 NOTE — Progress Notes (Signed)
Change dosing to 7.5 mg X 2 day a week and 5mg  X 5 days a week.     Reviewed per MD protocol.    Hayden Rasmussen, RN

## 2014-03-11 NOTE — Patient Instructions (Signed)
Heart is doing fine    See me in 6 months.

## 2014-03-11 NOTE — Progress Notes (Signed)
CARDIOLOGY PROGRESS NOTE    PATIENT:  Caitlin Wiley  DATE OF BIRTH:  1950/05/28  DATE OF SERVICE:  03/11/2014   PRIMARY CARE PHYSICIAN:   Salley Slaughter, MD (General)    PROBLEM LIST:    Patient Active Problem List   Diagnosis   . Asthma   . Hypercholesterolemia   . Hx of colonic polyps--Dr Read--03/21/10  cleared for 5 years   . Edema   . Sleep apnea   . HTN (hypertension)   . Menopausal state   . Rash   . Pulmonary nodule   . Grief reaction   . Strain of thoracic spine   . OA (osteoarthritis)   . GERD (gastroesophageal reflux disease)   . Palpitations       HISTORY OF PRESENT ILLNESS:   The patient is seen today in follow up of atrial fibrillation.  She believes that she's having increased forgetfulness and wonders if it is from the medication.  She is on sotalol which has a beta-blocking component.  She is not willing to abandoned the drug at this time.  She remained clinically in sinus rhythm and ECG shows underlying sinus rhythm.  QT interval is 444 and QTC is 446.  No evidence of ischemia identified.  She is using her sleep apnea device.      REVIEW OF SYSTEMS:   The complete review of systems is updated.    MEDICATIONS:    Current Outpatient Prescriptions   Medication Sig Dispense Refill   . Albuterol Sulfate HFA (PROAIR HFA) 108 (90 BASE) MCG/ACT Inhalation Aero Soln Inhale 1-2 puffs by mouth 4 times a day as needed (SOB). 1 Inhaler 1   . Ascorbic Acid (VITAMIN C OR) 500 mg     . ASPIRIN OR 81 mg     . Azithromycin 250 MG Oral Tab Take 2 tablets by mouth today, then take 1 tablet daily for 4 more days for infection, until all medication is taken. 6 tablet 0   . CALCIUM OR 600 mg     . CLONAZEPAM OR 0.5 mg q 12 h prn anxiety     . Clotrimazole-Betamethasone 1-0.05 % External Cream APPLY TO AFFECTED AREA(S) TWO TIMES DAILY 30 g 0   . FISH OIL  1000 mg     . Flaxseed, Linseed, (FLAXSEED OIL) 1200 MG Oral Cap None Entered     . Fluticasone Propionate  HFA (FLOVENT HFA) 110 MCG/ACT Inhalation Aerosol INHALE  TWO PUFFS BY MOUTH TWICE DAILY - RINSE MOUTH AFTER USE 1 Inhaler 3   . Fluticasone Propionate 50 MCG/ACT Nasal Suspension Spray 2 sprays into each nostril daily. 1 Inhaler 3   . GLUCOSAMINE CHONDROITIN COMPLX OR      . Lansoprazole 30 MG Oral CAPSULE DELAYED RELEASE Take 1 capsule (30 mg) by mouth daily. 32 capsule 6   . Magnesium 400 MG Oral Cap Take 400 mg by mouth.     . Montelukast Sodium 10 MG Oral Tab Take 1 tablet (10 mg) by mouth daily. 30 tablet 6   . Multiple Vitamins-Minerals (MULTIVITAMIN OR) no iron     . Probiotic Product (PROBIOTIC DAILY OR)      . Ranitidine HCl 150 MG Oral Tab Take 1 tablet (150 mg) by mouth at bedtime. 30 tablet 3   . Sotalol HCl 80 MG Oral Tab Take 1 tablet (80 mg) by mouth 2 times a day. 60 tablet 12   . Triamterene-HCTZ 37.5-25 MG Oral Tab Take 1 tablet by mouth  daily. 34 tablet 6   . Warfarin Sodium (COUMADIN) 5 MG Oral Tab Take 1 tablet (5 mg) by mouth daily. 30 tablet 3     No current facility-administered medications for this visit.       PHYSICAL EXAM:   On exam today, she looks well.  Blood pressure 122/68, pulse 88, resp. rate 20, weight 314 lb (142.429 kg), SpO2 98 %.  Weight is as noted in flow sheet.  Pulse is regular. JVD is less than five. The carotid upstrokes are normal and equal bilaterally without bruits. The apex beat is not palpable. Heart sounds S1 and S2 are normal. There are no added sounds and no murmurs. The lungs are clear. There is no lower extremity edema.  Some puffiness and legs but not true edema    IMPRESSION:  Remains in sinus rhythm on sotalol treatment is also on Coumadin treatment.  See me again in 6 months    [name], MD, [facility]  JP/jm    CC:  Salley Slaughter, MD (General)           Patient Care Team:  Elizebeth Brooking Ashok Norris, MD as PCP - General ([place])  Laurena Spies, MD ([place])

## 2014-03-18 NOTE — Progress Notes (Signed)
Reviewed and agree.

## 2014-03-20 ENCOUNTER — Ambulatory Visit: Payer: No Typology Code available for payment source | Admitting: Registered"

## 2014-03-20 VITALS — Wt 305.0 lb

## 2014-03-20 DIAGNOSIS — E7800 Pure hypercholesterolemia, unspecified: Secondary | ICD-10-CM

## 2014-03-20 NOTE — Progress Notes (Signed)
[facility] Clinic     Note type:  Reassessment    Chief Complaint   Patient presents with   . Hyperlipidemia   . Weight Problem       Direct time with patient: 30 mins        Assessment    (E78.0) Hypercholesterolemia  (primary encounter diagnosis)  (E66.01,  Z68.43) Morbid obesity with BMI of 50.0-59.9, adult  (I48.0) Paroxysmal atrial fibrillation    Wt 305 lb (138.347 kg)    Body mass index is 48.5 kg/(m^2).      Results for orders placed or performed in visit on 10/03/13   COMPREHENSIVE METABOLIC PANEL   Result Value Ref Range    Sodium 134 (L) 135 - 145 mEq/L    Potassium 3.3 (L) 3.6 - 5.2 mEq/L    Chloride 102 98 - 108 mEq/L    Carbon Dioxide, Total 23 22 - 32 mEq/L    Anion Gap 9 4 - 12    Glucose 102 62 - 125 mg/dL    Urea Nitrogen 26 (H) 8 - 21 mg/dL    Creatinine 0.96 0.38 - 1.02 mg/dL    Protein (Total) 6.3 6.0 - 8.2 g/dL    Albumin 3.9 3.5 - 5.2 g/dL    Bilirubin (Total) 0.6 0.2 - 1.3 mg/dL    Calcium 8.8 (L) 8.9 - 10.2 mg/dL    AST (GOT) 18 9 - 38 U/L    Alkaline Phosphatase (Total) 76 31 - 132 U/L    ALT (GPT) 19 7 - 33 U/L    GFR, Calc, European American 59 (L) >59 mL/min    GFR, Calc, African American >60 >59 mL/min    GFR, Information       Calculated GFR in mL/min/1.73 m2 by MDRD equation.  Inaccurate with changing renal function.  See http://[email or web address]        Results for orders placed or performed in visit on 07/31/12   LIPID PANEL   Result Value Ref Range    Cholesterol (Total) 201 (H) <200 mg/dL    Triglyceride 111 <150 mg/dL    Cholesterol (HDL) 60 >40 mg/dL    Cholesterol (LDL) 119 <130 mg/dL    Non-HDL Cholesterol 141 0 - 159 mg/dL    Cholesterol/HDL Ratio 3.4     Lipid Panel, Additional Info.         (NOTE)  For complete information to help interpret the lipid   panel, please   use the National Cholesterol Education Program (NCEP)   guideline based   reference range comments found here:  http://web.labmed.[employer].nl       Results  for orders placed or performed in visit on 09/25/13   THYROID STIMULATING HORMONE   Result Value Ref Range    Thyroid Stimulating Hormone 3.083 0.400 - 5.000 uIU/mL       Current Outpatient Prescriptions   Medication Sig Dispense Refill   . Albuterol Sulfate HFA (PROAIR HFA) 108 (90 BASE) MCG/ACT Inhalation Aero Soln Inhale 1-2 puffs by mouth 4 times a day as needed (SOB). 1 Inhaler 1   . Ascorbic Acid (VITAMIN C OR) 500 mg     . ASPIRIN OR 81 mg     . Azithromycin 250 MG Oral Tab Take 2 tablets by mouth today, then take 1 tablet daily for 4 more days for infection, until all medication is taken. 6 tablet 0   . CALCIUM OR 600 mg     . CLONAZEPAM OR 0.5 mg q 12 h prn anxiety     .  Clotrimazole-Betamethasone 1-0.05 % External Cream APPLY TO AFFECTED AREA(S) TWO TIMES DAILY 30 g 0   . FISH OIL  1000 mg     . Flaxseed, Linseed, (FLAXSEED OIL) 1200 MG Oral Cap None Entered     . Fluticasone Propionate  HFA (FLOVENT HFA) 110 MCG/ACT Inhalation Aerosol INHALE TWO PUFFS BY MOUTH TWICE DAILY - RINSE MOUTH AFTER USE 1 Inhaler 3   . Fluticasone Propionate 50 MCG/ACT Nasal Suspension Spray 2 sprays into each nostril daily. 1 Inhaler 3   . GLUCOSAMINE CHONDROITIN COMPLX OR      . Lansoprazole 30 MG Oral CAPSULE DELAYED RELEASE Take 1 capsule (30 mg) by mouth daily. 32 capsule 6   . Magnesium 400 MG Oral Cap Take 400 mg by mouth.     . Montelukast Sodium 10 MG Oral Tab Take 1 tablet (10 mg) by mouth daily. 30 tablet 6   . Multiple Vitamins-Minerals (MULTIVITAMIN OR) no iron     . Probiotic Product (PROBIOTIC DAILY OR)      . Ranitidine HCl 150 MG Oral Tab Take 1 tablet (150 mg) by mouth at bedtime. 30 tablet 3   . Sotalol HCl 80 MG Oral Tab Take 1 tablet (80 mg) by mouth 2 times a day. 60 tablet 12   . Triamterene-HCTZ 37.5-25 MG Oral Tab Take 1 tablet by mouth daily. 34 tablet 6   . Warfarin Sodium (COUMADIN) 5 MG Oral Tab 7.5 mg X 2 days a week and 5mg  X 5 days a week or as instructed by protime clinic. 110 tablet 0     No  current facility-administered medications for this visit.       Vitamins/Minerals/Herbal/Supplements: Tumeric, magnesium, multivitamin, glucosamine, chondroitin, flax oil, vitamin D, fiber supplement, Emergen-C, probiotic        History   Substance Use Topics   . Smoking status: Never Smoker    . Smokeless tobacco: Never Used   . Alcohol Use: Yes       Family History   Problem Relation Age of Onset   . Arthritis Mother    . Breast Cancer Mother      & tongue cancer   . Mental Illness Mother    . Stroke Mother      possible   . Hypertension Mother    . Anesthesia Problems Mother    . Arthritis Father    . Cancer Father    . Diabetes Father    . Colon Polyps Father    . Heart Disease Father      torn valve 01/1999   . Hypertension Father    . Stroke Father    . Asthma       and/or allergies - grandfather   . Cancer       grandparents   . Heart Disease Maternal Grandfather    . Heart Disease Paternal Grandfather    . Tuberculosis       great-grandmother   . Arthritis       grandmother   . Breast Cancer       grandmother   . Mental Illness Brother    . Lupus Aunt/Uncle      great-aunt       Activity/Exercise:  Not much-only adult daily living activities    Diet History:   Food intolerance/allergies: possible sensitivity to sulfites, more joint pain with potato and tomatoes  Diet recall: Reviewed with patient     Information from Patient: Issues with gastroenteritis the past week.  Diarrhea,  gas, constipation, pain, diarrhea.  Says she is feeling better, but eating much less than usual.          Nutrition Diagnosis: Food and nut related knowledge deficit  related to various health concerns and weight evidenced by comments      Intervention  Medical Nutrition Therapy: for weight control to improve heart health and possibly control atrial fibrillation.  Discussed patient's changes and challenges. Weight is down almost 7 pounds in past month.  Weight loss is probably partly due to greatly reduced intake with stomach issues.   Encouraged her to resume healthy foods as tolerated and to practice portion control.  Discussed physical activity.  Doesn't think she could walk, but is open to doing chair exercise.  Says she has recorded Sit and Be Fit programs but has not viewed them.  Encouraged her to stretch with the program.    Patient stated goals: Weight loss, control excema  Nutrition topics taught: Diet:   anti-inflammatory, vitamin K content of foods, weight control tips, diet tips to manage diarrhea  Education Materials Provided: As appropriate to medical nutrition therapy    Monitoring/Evaluation: weigh at clinic visits     Plan/Recommendations: Implement some of the changes discussed.  Plan to continue work on weight control.     Follow up: In 1 month(s)      Ishmael Holter, RDN, CD, CDE  Registered Dietitian/Nutritionist  Certified Diabetes Educator

## 2014-03-25 ENCOUNTER — Ambulatory Visit: Payer: No Typology Code available for payment source

## 2014-03-25 DIAGNOSIS — I4891 Unspecified atrial fibrillation: Secondary | ICD-10-CM | POA: Insufficient documentation

## 2014-03-25 LAB — PR PROTHROMBIN TIME, ONSITE: prothrombin INR: 2.8

## 2014-03-25 NOTE — Progress Notes (Signed)
7.5 mg X 2 day a week and 5mg  X 5 days a week.     Reviewed per MD protocol.    Hayden Rasmussen, RN

## 2014-03-27 ENCOUNTER — Telehealth (HOSPITAL_BASED_OUTPATIENT_CLINIC_OR_DEPARTMENT_OTHER): Payer: Self-pay

## 2014-03-27 NOTE — Telephone Encounter (Signed)
Patient has underlying atrial fibrillation.  She can be off Coumadin 3-5 days prior to the procedure and then restart according to the endoscopist.

## 2014-03-27 NOTE — Telephone Encounter (Signed)
Colonoscopy and possible biopsy on March 16th, patient is wondering if she can stop coumadin March 12th through the 16th and resume asap per her GI MD.     Routed to Dr. Ivan Croft for review.

## 2014-03-28 ENCOUNTER — Encounter (INDEPENDENT_AMBULATORY_CARE_PROVIDER_SITE_OTHER): Payer: Self-pay | Admitting: Neurology

## 2014-03-28 ENCOUNTER — Ambulatory Visit (INDEPENDENT_AMBULATORY_CARE_PROVIDER_SITE_OTHER): Payer: No Typology Code available for payment source | Admitting: Neurology

## 2014-03-28 VITALS — BP 116/74 | HR 54 | Ht 66.5 in | Wt 300.0 lb

## 2014-03-28 DIAGNOSIS — G441 Vascular headache, not elsewhere classified: Secondary | ICD-10-CM

## 2014-03-28 NOTE — Patient Instructions (Signed)
If head pain becomes more frequent, consider taking Magnesium 500 mg in am or Ginger root 550 mg in am to prevent symptoms

## 2014-03-31 NOTE — Progress Notes (Signed)
[name], [name]          [id number]          03/28/2014    Dear Dr. Elizebeth Brooking, thank you for referring your patient for neurological consultation.    IDENTIFICATION AND CHIEF COMPLAINT     [name] is a middle-aged lady with new onset of right temporal headaches.    HISTORY OF PRESENT ILLNESS     Per patient statement, she has been experiencing recurrent episodes of jolts of pain localized to the right temple.  Symptoms occur suddenly, described as sharp, stabbing, short-lasting pain.  There are no associated focal neurological complaints.  The patient denies any visual changes.  She denies any jaw pain or jaw claudication.  No neck pain.  Onset of symptoms was not related to any injury or illness.  There are no specific contributing factors.  No associated sensitivity to light, heat, smell, sound, dizziness, or vertigo.  No nausea.  The patient denies any arthralgias, myalgias.  Denies fever, chills.    REVIEW OF SYSTEMS     Please refer to history of present illness, all other systems reviewed and negative.    Past Medical History    Past Medical History   Diagnosis Date   . Muscle cramps 04/2011   . Leukocytosis    . Osteoarthritis      chronic   . GERD (gastroesophageal reflux disease)    . Anxiety    . Cervical disc disease    . Pain of toe of right foot 02/2010     5th toe       Past Surgical History    Past Surgical History   Procedure Laterality Date   . Colonoscopy flx dx w/collj spec when pfrmd  11/2005   . Surgical hx other  08/1999     knee surgery       Family History: per EMR documentation, reviewed with the patient.    Family History   Problem Relation Age of Onset   . Arthritis Mother    . Breast Cancer Mother      & tongue cancer   . Mental Illness Mother    . Stroke Mother      possible   . Hypertension Mother    . Anesthesia Problems Mother    . Arthritis Father    . Cancer Father    . Diabetes Father    . Colon Polyps Father    . Heart Disease Father      torn valve 01/1999   .  Hypertension Father    . Stroke Father    . Asthma       and/or allergies - grandfather   . Cancer       grandparents   . Heart Disease Maternal Grandfather    . Heart Disease Paternal Grandfather    . Tuberculosis       great-grandmother   . Arthritis       grandmother   . Breast Cancer       grandmother   . Mental Illness Brother    . Lupus Aunt/Uncle      great-aunt       Allergies    Review of patient's allergies indicates:  Allergies   Allergen Reactions   . Bee Venom    . Cats [Animals]    . Ciprofloxacin      Joint aching   . Dust Mite Extract      DUST   . Pollen  Extract      POLLEN   . Sulfa Antibiotics        Current Medications    Current Outpatient Prescriptions   Medication Sig Dispense Refill   . Albuterol Sulfate HFA (PROAIR HFA) 108 (90 BASE) MCG/ACT Inhalation Aero Soln Inhale 1-2 puffs by mouth 4 times a day as needed (SOB). 1 Inhaler 1   . Ascorbic Acid (VITAMIN C OR) 500 mg     . ASPIRIN OR 81 mg     . Azithromycin 250 MG Oral Tab Take 2 tablets by mouth today, then take 1 tablet daily for 4 more days for infection, until all medication is taken. 6 tablet 0   . CALCIUM OR 600 mg     . CLONAZEPAM OR 0.5 mg q 12 h prn anxiety     . Clotrimazole-Betamethasone 1-0.05 % External Cream APPLY TO AFFECTED AREA(S) TWO TIMES DAILY 30 g 0   . FISH OIL  1000 mg     . Flaxseed, Linseed, (FLAXSEED OIL) 1200 MG Oral Cap None Entered     . Fluticasone Propionate  HFA (FLOVENT HFA) 110 MCG/ACT Inhalation Aerosol INHALE TWO PUFFS BY MOUTH TWICE DAILY - RINSE MOUTH AFTER USE 1 Inhaler 3   . Fluticasone Propionate 50 MCG/ACT Nasal Suspension Spray 2 sprays into each nostril daily. 1 Inhaler 3   . GLUCOSAMINE CHONDROITIN COMPLX OR      . Lansoprazole 30 MG Oral CAPSULE DELAYED RELEASE Take 1 capsule (30 mg) by mouth daily. 32 capsule 6   . Magnesium 400 MG Oral Cap Take 400 mg by mouth.     . Montelukast Sodium 10 MG Oral Tab Take 1 tablet (10 mg) by mouth daily. 30 tablet 6   . Multiple Vitamins-Minerals  (MULTIVITAMIN OR) no iron     . Probiotic Product (PROBIOTIC DAILY OR)      . Ranitidine HCl 150 MG Oral Tab Take 1 tablet (150 mg) by mouth at bedtime. 30 tablet 3   . Sotalol HCl 80 MG Oral Tab Take 1 tablet (80 mg) by mouth 2 times a day. 60 tablet 12   . Triamterene-HCTZ 37.5-25 MG Oral Tab Take 1 tablet by mouth daily. 34 tablet 6   . Warfarin Sodium (COUMADIN) 5 MG Oral Tab 7.5 mg X 2 days a week and 2m X 5 days a week or as instructed by protime clinic. 110 tablet 0     No current facility-administered medications for this visit.     Blood pressure 116/74, pulse 54, height 5' 6.5" (1.689 m), weight 300 lb (136.079 kg).     Exam:  GENERAL: Awake, alert, oriented , in no apparent distress.  HEENT: Normocephalic, atraumatic. FROM.  EYES: sclera none icteric  RESPIRATORY: Breathing comfortably on room air.  CARDIOVASCULAR: Regular rate and rhythm.  ABDOMEN: nondistended.  SKIN: no rash.  Neuro Examination  Mental Status:normal orientation, attention, concentration and language  Cranial Nerves: II-XII grossly intact, good functional vision, grossly appropriate visual fields, EOMI, appropriate pupillary responses, face symmetrical, functional hearing, palate elevates symmetrically, sternocleidomastoids strong and no nystagmus  Motor:    Tone: normal    Atrophy/Fasiculations: absent    Pronator Drift: absent    Strength:5/5 in all muscle groups of the upper and lower extremities   Reflexes: present and symmetrical  Coordination: finger to nose, fine finger movements and rapid alternating movements were intact  Station and Gait:Romberg, tandem and gait were intact  Sensory: normal  Plantar stimulation revealed bilaterally downgoing toes.  IMPRESSION      ICD-10-CM ICD-9-CM    1. 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  I have discussed the condition with the patient at length and in detail.  I have reviewed recent  tests that included normal ESR and C-reactive protein.  The patient was advised to undergo brain MRI imaging.  I have discussed with patient preventative measures that can help reduce frequency and intensity of the idiopathic stabbing headaches.  The patient was advised to consider taking daily magnesium 500 mg twice a day.  She will follow up with me after the test or sooner if necessary.     Discussed with the patient and all questioned fully answered. She will call me if any problems arise.    Total time spend  60  minutes, over 50 % of the time was spend on face to face counseling and coordinating of care.

## 2014-04-04 ENCOUNTER — Encounter (INDEPENDENT_AMBULATORY_CARE_PROVIDER_SITE_OTHER): Payer: Self-pay

## 2014-04-08 ENCOUNTER — Ambulatory Visit: Payer: No Typology Code available for payment source

## 2014-04-08 DIAGNOSIS — I4891 Unspecified atrial fibrillation: Secondary | ICD-10-CM | POA: Insufficient documentation

## 2014-04-08 LAB — PR PROTHROMBIN TIME, ONSITE: prothrombin INR: 1.4

## 2014-04-08 NOTE — Progress Notes (Signed)
7.5 mg X 2 day a week and 5mg  X 5 days a week. Recheck in one week.       Just resumed coumadin after colonoscopy.     Reviewed per MD protocol.    Hayden Rasmussen, RN       Routed to Dr. Ivan Croft.

## 2014-04-09 NOTE — Progress Notes (Signed)
Reviewed and agree.

## 2014-04-12 ENCOUNTER — Telehealth (INDEPENDENT_AMBULATORY_CARE_PROVIDER_SITE_OTHER): Payer: Self-pay | Admitting: Neurology

## 2014-04-12 NOTE — Telephone Encounter (Signed)
CONFIRMED PHONE NUMBER: [phone]  CALLERS FIRST AND LAST NAME: Caitlin Wiley  FACILITY NAME: na TITLE: na  CALLERS RELATIONSHIP:Self  RETURN CALL: Detailed message on voicemail only         SUBJECT: Appointment Request   REASON FOR REQUEST: MRI follow up    REQUEST APPOINTMENT WITH: [place]: MRI follow up  REFERRING PROVIDER: Quentin Cornwall  REQUESTED DATE: please call  REQUESTED TIME: please call  UNABLE TO APPOINT: Other: Clinic to schedule but they are closed Friday

## 2014-04-15 NOTE — Telephone Encounter (Signed)
Scheduled follow up for review MRI.

## 2014-04-16 ENCOUNTER — Ambulatory Visit (HOSPITAL_BASED_OUTPATIENT_CLINIC_OR_DEPARTMENT_OTHER): Payer: No Typology Code available for payment source

## 2014-04-17 ENCOUNTER — Telehealth (INDEPENDENT_AMBULATORY_CARE_PROVIDER_SITE_OTHER): Payer: Self-pay

## 2014-04-17 ENCOUNTER — Encounter (INDEPENDENT_AMBULATORY_CARE_PROVIDER_SITE_OTHER): Payer: Self-pay

## 2014-04-17 ENCOUNTER — Ambulatory Visit (INDEPENDENT_AMBULATORY_CARE_PROVIDER_SITE_OTHER): Payer: No Typology Code available for payment source

## 2014-04-17 VITALS — BP 130/90 | HR 65 | Temp 98.6°F | Ht 66.5 in | Wt 301.0 lb

## 2014-04-17 DIAGNOSIS — J029 Acute pharyngitis, unspecified: Secondary | ICD-10-CM

## 2014-04-17 DIAGNOSIS — I480 Paroxysmal atrial fibrillation: Secondary | ICD-10-CM | POA: Insufficient documentation

## 2014-04-17 DIAGNOSIS — I482 Chronic atrial fibrillation, unspecified: Secondary | ICD-10-CM

## 2014-04-17 LAB — PR PROTHROMBIN TIME, ONSITE: prothrombin INR: 1.9

## 2014-04-17 MED ORDER — CEFDINIR 300 MG OR CAPS
300.0000 mg | ORAL_CAPSULE | Freq: Two times a day (BID) | ORAL | Status: DC
Start: 2014-04-17 — End: 2014-05-21

## 2014-04-17 NOTE — Telephone Encounter (Signed)
She feels much worse, now has a white patch in the back of her throat.  She will be seen today.

## 2014-04-17 NOTE — Telephone Encounter (Signed)
Caitlin Wiley would like Dr Lemmie Evens to call her -"feels horrible" and would like to talk to you first-maybe avoid an appmt

## 2014-04-17 NOTE — Progress Notes (Signed)
I, Ricki Miller, MD interviewed and examined the patient while overseeing the documentation performed by my Medical Scribe, Dineen Kid. I have reviewed and revised as necessary the scribe's note and agree with the documented findings and plan of care. Please refer to the scribe's note below for further detailed information regarding the patient encounter and exam.  04/17/2014. 1:38 PM.

## 2014-04-17 NOTE — Telephone Encounter (Signed)
CONFIRMED PHONE NUMBER: [phone]  CALLERS FIRST AND LAST NAME: [name]    FACILITY NAME: n/a TITLE: n/a  CALLERS RELATIONSHIP:Self  RETURN CALL: General message on voicemail only     [facility] only) Texting is an option for this clinic. If you would like [place] to use this option, which mobile phone number should we text to if we are unable to reach you?    SUBJECT: Appointment Request   REASON FOR REQUEST: same day appointment     REQUEST APPOINTMENT WITH: Dr. Elizebeth Brooking  SYMPTOMS: cold,congestion, sore throat, patient is losing her voice. Patient advised she went to an urgent care clinic a few days prior, was tested for strep throat, test came back negative  REFERRING PROVIDER: n/a  REQUESTED DATE: 04/17/2014  REQUESTED TIME: n/a  UNABLE TO APPOINT: Appointment length unavailable

## 2014-04-17 NOTE — Progress Notes (Signed)
DATE: 04/17/2014      PHYSICIAN NOTE:  Patient Name: Caitlin Wiley  Medical Record Number: [id number]  Visit Date/time: 04/17/2014     Date of Birth: 1950/07/25    CHIEF COMPLAINT:    Caitlin Wiley is a 64 year old female who is here today due to a sore throat.     PROBLEMS TO BE ADDRESSED ON TODAYS VISIT:  (J02.9) Exudative pharyngitis  (primary encounter diagnosis)  (I48.2) Chronic atrial fibrillation    HPI:  History provided by patient.    Caitlin Wiley is a 64 year old female who is here today due to a sore throat, onset Sunday. She did go to a walk in clinic on Sunday, where a rapid strep test was performed, and was negative. The patient states that the sore throat has been worsening since then. She does note an oral exudate. Associated symptoms include some R lymphadenopathy and a slight cough.       Past Medical History:  As cited in EHR, updated and reviewed by myself on 04/17/2014    Family History:  As cited in EHR, updated and reviewed by myself on 04/17/2014    Medications/Allergies:  As cited in EHR, updated and reviewed by myself on 04/17/2014      REVIEW OF SYSTEMS:    ENT: pt reports sore throat, lymphadenopathy   RESPIRATORY: Denies, dyspnea. Pt reports slight cough   CARDIO: H/o chronic atrial fibrillation    PHYSICAL EXAM:  BP 130/90 mmHg  Pulse 65  Temp(Src) 98.6 F (37 C) (Tympanic)  Ht 5' 6.5" (1.689 m)  Wt 301 lb (136.533 kg)  BMI 47.86 kg/m2  Physical Exam:  Vital signs as reported above.    Head: Normocephalic, atraumatic    Eyes: EOMI, PERLA    Ears: TM's clear bilaterally    Nose: Septum intact, no lesions, pink mucosa and turbinates, no discharge    Pharynx: There is a white exudate to the R posterior pharynx. The pharynx is erythematous. R submandibular node    Neck: Supple, without JVD  or goiter. R submandibular node     Chest: Clear    CV: Heart: RRR, no murmur, rub, S3, S4 noted    Extrem: no edema      ASSESSMENT AND PLAN:  Pertinent Labs & Imaging studies  reviewed. (See chart for details)  Prior EMR records reviewed in EPIC as available and clinically relevant.    1. Exudative pharyngitis  The patient reports a worsening sore throat. There is lymphadenopathy to the R submandibular region on examination, along with a white exudate and erythema to the pharynx. I will empirically treat the patient for bacterial pharyngitis with a course of Omnicef. She is to let me know if her symptoms persist or worsen. The patient is agreeable to this plan, no further questions at this time.   - Cefdinir 300 MG Oral Cap; Take 1 capsule (300 mg) by mouth every 12 hours.  Dispense: 14 capsule; Refill: 0    2. Chronic atrial fibrillation  As I am providing her with an abx, she is to have a ProTime today. She is then to have a repeat ProTime on Monday or Tuesday.   - PROTHROMBIN TIME, ONSITE    This appointment took 15 minutes and more than 50% was spent in counseling and/or coordination of care     Further treatment dependant on the results of the tests.    Seek immediate attention if symptoms are worsening.  Call if symptoms are not resolving with therapy.      Portions of this chart were written by Dineen Kid, Medical Scribe with oversight by Ricki Miller, MD 04/17/2014 12:28 PM     I, Ricki Miller, have reviewed the information recorded by the scribe for accuracy and agree with its contents.    This medical record has been electronically generated with voice recognition software. Every attempt is made to assure accuracy.   However. it may contain errors related to this process.

## 2014-04-17 NOTE — Patient Instructions (Addendum)
Seek immediate attention if symptoms are worsening.    Call if symptoms are not resolving with therapy.      Self-Care for Sore Throats  Sore throats occur for many reasons, such as colds, allergies, and infections caused by viruses or bacteria. In any case, your throat becomes red and sore. Your goal for self-care is to reduce your discomfort while giving your throat a chance to heal.    Moisten and Soothe Your Throat   Try a sip of water first thing after waking up.   Keep your throat moist by drinking6 or more glasses of clear liquids every day.   Run a cool-air humidifier in your room overnight.   Avoid cigarette smoke.   Suck on throat lozenges, cough drops, hard candy, ice chips, or frozen fruit-juice bars. Use the sugar-free versions if your diet or medical condition require them.  Gargle to Ease Irritation  Gargling every hour or2 can ease irritation. Try gargling with1 of these solutions:   1/4teaspoon of salt in1/2 cup of warm water   An over-the-counter anesthetic gargle  Use Medication for More Relief  Over-the-counter medication can reduce sore throat symptoms. Ask your pharmacist if you have questions about which medication to use:   Ease pain with anesthetic sprays. Aspirin or an aspirin substitute also helps. Remember, never give aspirin to anyone 53 or younger, or if you are alreadytaking blood thinners.   For sore throats caused by allergies, try antihistamines to block the allergic reaction.   Remember: unless a sore throat is caused by a bacterial infection, antibiotics won't help you.  Prevent Future Sore Throats   Stop smoking or reduce contact with secondhand smoke. Smoke irritates the tender throat lining.   Limit contact with pets and with allergy-causing substances, such as pollen and mold.   When you're around someone with a sore throat or cold, wash your hands frequently to keep viruses or bacteria from spreading.   Don't strain your vocal cords.     [address] [employer]  [place], [place] [address]. All rights reserved. This information is not intended as a substitute for professional medical care. Always follow your healthcare professional's instructions.

## 2014-04-17 NOTE — Progress Notes (Signed)
I, Ricki Miller, MD interviewed and examined the patient while overseeing the documentation performed by my Medical Scribe, Dineen Kid. I have reviewed and revised as necessary the scribe's note and agree with the documented findings and plan of care. Please refer to the scribe's note below for further detailed information regarding the patient encounter and exam.  04/17/2014. 1:39 PM.

## 2014-04-18 ENCOUNTER — Telehealth (HOSPITAL_BASED_OUTPATIENT_CLINIC_OR_DEPARTMENT_OTHER): Payer: Self-pay

## 2014-04-18 ENCOUNTER — Ambulatory Visit (HOSPITAL_BASED_OUTPATIENT_CLINIC_OR_DEPARTMENT_OTHER): Payer: No Typology Code available for payment source

## 2014-04-18 NOTE — Telephone Encounter (Signed)
7.5 mg X 2 day a week and 5mg  X 5 days a week. Recheck Monday because ABX started.     Reviewed per MD protocol.    Hayden Rasmussen, RN

## 2014-04-19 NOTE — Telephone Encounter (Signed)
Review and agree

## 2014-04-22 ENCOUNTER — Ambulatory Visit: Payer: No Typology Code available for payment source

## 2014-04-22 DIAGNOSIS — I480 Paroxysmal atrial fibrillation: Secondary | ICD-10-CM | POA: Insufficient documentation

## 2014-04-22 LAB — PR PROTHROMBIN TIME, ONSITE: prothrombin INR: 2.1

## 2014-04-22 NOTE — Progress Notes (Signed)
No change.  Barb Crane,  RN  [facility]

## 2014-04-24 ENCOUNTER — Ambulatory Visit: Payer: No Typology Code available for payment source | Admitting: Registered"

## 2014-04-24 VITALS — Wt 306.0 lb

## 2014-04-24 DIAGNOSIS — E7800 Pure hypercholesterolemia, unspecified: Secondary | ICD-10-CM

## 2014-04-24 NOTE — Progress Notes (Signed)
N[facility] Clinic     Note type:  Reassessment    Chief Complaint   Patient presents with   . Hyperlipidemia   . Weight Problem       Direct time with patient: 30 mins        Assessment    (E78.0) Hypercholesterolemia  (primary encounter diagnosis)  (E66.01,  Z68.43) Morbid obesity with BMI of 50.0-59.9, adult  (I48.0) Paroxysmal atrial fibrillation    Wt 306 lb (138.801 kg)    Body mass index is 48.66 kg/(m^2).      Results for orders placed or performed in visit on 10/03/13   COMPREHENSIVE METABOLIC PANEL   Result Value Ref Range    Sodium 134 (L) 135 - 145 mEq/L    Potassium 3.3 (L) 3.6 - 5.2 mEq/L    Chloride 102 98 - 108 mEq/L    Carbon Dioxide, Total 23 22 - 32 mEq/L    Anion Gap 9 4 - 12    Glucose 102 62 - 125 mg/dL    Urea Nitrogen 26 (H) 8 - 21 mg/dL    Creatinine 0.96 0.38 - 1.02 mg/dL    Protein (Total) 6.3 6.0 - 8.2 g/dL    Albumin 3.9 3.5 - 5.2 g/dL    Bilirubin (Total) 0.6 0.2 - 1.3 mg/dL    Calcium 8.8 (L) 8.9 - 10.2 mg/dL    AST (GOT) 18 9 - 38 U/L    Alkaline Phosphatase (Total) 76 31 - 132 U/L    ALT (GPT) 19 7 - 33 U/L    GFR, Calc, European American 59 (L) >59 mL/min    GFR, Calc, African American >60 >59 mL/min    GFR, Information       Calculated GFR in mL/min/1.73 m2 by MDRD equation.  Inaccurate with changing renal function.  See http://depts.YourCloudFront.fr.html        Results for orders placed or performed in visit on 07/31/12   LIPID PANEL   Result Value Ref Range    Cholesterol (Total) 201 (H) <200 mg/dL    Triglyceride 111 <150 mg/dL    Cholesterol (HDL) 60 >40 mg/dL    Cholesterol (LDL) 119 <130 mg/dL    Non-HDL Cholesterol 141 0 - 159 mg/dL    Cholesterol/HDL Ratio 3.4     Lipid Panel, Additional Info.         (NOTE)  For complete information to help interpret the lipid   panel, please   use the National Cholesterol Education Program (NCEP)   guideline based   reference range comments found here:  http://web.labmed.SeekArcade.nl              Results for orders placed or performed in visit on 09/25/13   THYROID STIMULATING HORMONE   Result Value Ref Range    Thyroid Stimulating Hormone 3.083 0.400 - 5.000 uIU/mL       Current Outpatient Prescriptions   Medication Sig Dispense Refill   . Albuterol Sulfate HFA (PROAIR HFA) 108 (90 BASE) MCG/ACT Inhalation Aero Soln Inhale 1-2 puffs by mouth 4 times a day as needed (SOB). 1 Inhaler 1   . Ascorbic Acid (VITAMIN C OR) 500 mg     . ASPIRIN OR 81 mg     . Azithromycin 250 MG Oral Tab Take 2 tablets by mouth today, then take 1 tablet daily for 4 more days for infection, until all medication is taken. 6 tablet 0   . CALCIUM OR 600 mg     . Cefdinir 300 MG  Oral Cap Take 1 capsule (300 mg) by mouth every 12 hours. 14 capsule 0   . CLONAZEPAM OR 0.5 mg q 12 h prn anxiety     . Clotrimazole-Betamethasone 1-0.05 % External Cream APPLY TO AFFECTED AREA(S) TWO TIMES DAILY 30 g 0   . FISH OIL  1000 mg     . Flaxseed, Linseed, (FLAXSEED OIL) 1200 MG Oral Cap None Entered     . Fluticasone Propionate  HFA (FLOVENT HFA) 110 MCG/ACT Inhalation Aerosol INHALE TWO PUFFS BY MOUTH TWICE DAILY - RINSE MOUTH AFTER USE 1 Inhaler 3   . Fluticasone Propionate 50 MCG/ACT Nasal Suspension Spray 2 sprays into each nostril daily. 1 Inhaler 3   . GLUCOSAMINE CHONDROITIN COMPLX OR      . Lansoprazole 30 MG Oral CAPSULE DELAYED RELEASE Take 1 capsule (30 mg) by mouth daily. 32 capsule 6   . Magnesium 400 MG Oral Cap Take 400 mg by mouth.     . Montelukast Sodium 10 MG Oral Tab Take 1 tablet (10 mg) by mouth daily. 30 tablet 6   . Multiple Vitamins-Minerals (MULTIVITAMIN OR) no iron     . Probiotic Product (PROBIOTIC DAILY OR)      . Ranitidine HCl 150 MG Oral Tab Take 1 tablet (150 mg) by mouth at bedtime. 30 tablet 3   . Sotalol HCl 80 MG Oral Tab Take 1 tablet (80 mg) by mouth 2 times a day. 60 tablet 12   . Triamterene-HCTZ 37.5-25 MG Oral Tab Take 1 tablet by mouth daily. 34 tablet 6   . Warfarin Sodium (COUMADIN) 5 MG Oral Tab  7.5 mg X 2 days a week and 5mg  X 5 days a week or as instructed by protime clinic. 110 tablet 0     No current facility-administered medications for this visit.       Vitamins/Minerals/Herbal/Supplements: Tumeric, magnesium, multivitamin, glucosamine, chondroitin, flax oil, vitamin D, fiber supplement, Emergen-C, probiotic        History   Substance Use Topics   . Smoking status: Never Smoker    . Smokeless tobacco: Never Used   . Alcohol Use: 0.0 oz/week     0 Standard drinks or equivalent per week       Family History   Problem Relation Age of Onset   . Arthritis Mother    . Breast Cancer Mother      & tongue cancer   . Mental Illness Mother    . Stroke Mother      possible   . Hypertension Mother    . Anesthesia Problems Mother    . Arthritis Father    . Cancer Father    . Diabetes Father    . Colon Polyps Father    . Heart Disease Father      torn valve 01/1999   . Hypertension Father    . Stroke Father    . Asthma       and/or allergies - grandfather   . Cancer       grandparents   . Heart Disease Maternal Grandfather    . Heart Disease Paternal Grandfather    . Tuberculosis       great-grandmother   . Arthritis       grandmother   . Breast Cancer       grandmother   . Mental Illness Brother    . Lupus Aunt/Uncle      great-aunt       Activity/Exercise:  Not much-only adult  daily living activities    Diet History:   Food intolerance/allergies: possible sensitivity to sulfites, more joint pain with potato and tomatoes  Diet recall: Reviewed with patient     Information from Patient: Had to prepare for colonoscopy, then was ill for 10 days.  Now back to eating again.  Says she was down on her scale earlier in the week, but had salty foods yesterday.  Was on antibiotics during her illness.  Wants to use probiotics to help restore healthy gut microbes.         Nutrition Diagnosis: Food and nut related knowledge deficit  related to various health concerns and weight evidenced by comments      Intervention  Medical  Nutrition Therapy: for weight control to improve heart health and possibly control atrial fibrillation.  Discussed patient's changes and challenges. Weight is up a pound from last month.  Encouraged her to be as active as she is able now that she is feeling better.  Discussed portion control and resuming healthy eating.  Recommend using probiotics, probiotic containing foods like yogurt and kefir and prebiotic foods like soluble fiber containing vegetables.    Patient stated goals: Weight loss, control excema  Nutrition topics taught: Diet:   anti-inflammatory, weight control tips, probiotics  Education Materials Provided: As appropriate to medical nutrition therapy    Monitoring/Evaluation: weigh at clinic visits     Plan/Recommendations: Implement some of the changes discussed.  Plan to continue work on weight control.     Follow up: In 1 month(s)      Ishmael Holter, RDN, CD, CDE  Registered Dietitian/Nutritionist  Certified Diabetes Educator

## 2014-04-29 ENCOUNTER — Ambulatory Visit (HOSPITAL_BASED_OUTPATIENT_CLINIC_OR_DEPARTMENT_OTHER): Payer: No Typology Code available for payment source

## 2014-04-30 ENCOUNTER — Ambulatory Visit: Payer: No Typology Code available for payment source

## 2014-05-02 ENCOUNTER — Ambulatory Visit (INDEPENDENT_AMBULATORY_CARE_PROVIDER_SITE_OTHER): Payer: No Typology Code available for payment source | Admitting: Neurology

## 2014-05-02 VITALS — BP 140/83 | HR 62 | Wt 306.0 lb

## 2014-05-02 DIAGNOSIS — G4485 Primary stabbing headache: Secondary | ICD-10-CM

## 2014-05-06 ENCOUNTER — Ambulatory Visit (HOSPITAL_BASED_OUTPATIENT_CLINIC_OR_DEPARTMENT_OTHER): Payer: No Typology Code available for payment source

## 2014-05-20 ENCOUNTER — Encounter (INDEPENDENT_AMBULATORY_CARE_PROVIDER_SITE_OTHER): Payer: Self-pay

## 2014-05-20 DIAGNOSIS — N61 Mastitis without abscess: Secondary | ICD-10-CM

## 2014-05-20 MED ORDER — CEPHALEXIN 500 MG OR CAPS
500.0000 mg | ORAL_CAPSULE | Freq: Four times a day (QID) | ORAL | Status: DC
Start: 2014-05-20 — End: 2014-05-21

## 2014-05-20 NOTE — Telephone Encounter (Signed)
I sent the patient a message on the care that she should check her cell phone.  I left messages on both the home phone and cell phone stating that for any type of urgent appointment she should always call, as he care messages are a slower process.  I'm concerned about the possibility of mastitis of the breast by her description, and taken the liberty of calling in a prescription for Keflex 500 mg 4 times a day.  2 days supplies issue.  She does have an appointment with me tomorrow and we will further discuss the situation.

## 2014-05-21 ENCOUNTER — Encounter (INDEPENDENT_AMBULATORY_CARE_PROVIDER_SITE_OTHER): Payer: Self-pay

## 2014-05-21 ENCOUNTER — Ambulatory Visit: Payer: No Typology Code available for payment source

## 2014-05-21 ENCOUNTER — Ambulatory Visit (INDEPENDENT_AMBULATORY_CARE_PROVIDER_SITE_OTHER): Payer: No Typology Code available for payment source

## 2014-05-21 VITALS — BP 110/80 | HR 64 | Ht 66.5 in | Wt 301.0 lb

## 2014-05-21 DIAGNOSIS — N644 Mastodynia: Secondary | ICD-10-CM

## 2014-05-21 DIAGNOSIS — I4891 Unspecified atrial fibrillation: Secondary | ICD-10-CM | POA: Insufficient documentation

## 2014-05-21 LAB — PR PROTHROMBIN TIME, ONSITE: prothrombin INR: 2.7

## 2014-05-21 MED ORDER — CEFDINIR 300 MG OR CAPS
300.0000 mg | ORAL_CAPSULE | Freq: Two times a day (BID) | ORAL | Status: DC
Start: 2014-05-21 — End: 2015-05-30

## 2014-05-21 NOTE — Progress Notes (Signed)
Instructed patient to continue Warfarin 7.5 mg x 2 days a week (T,Th) and 5 mg x 5 days a week and re check INR in 3-4 weeks.  Patient verbalized understanding of the plan.  Reviewed per MD protocol.    [place], RN

## 2014-05-21 NOTE — Patient Instructions (Signed)
Seek immediate attention if symptoms are worsening.    Call if symptoms are not resolving with therapy.

## 2014-05-21 NOTE — Progress Notes (Signed)
DATE: 05/21/2014     PHYSICIAN NOTE:  Patient Name: Caitlin Wiley  Medical Record Number: [id number]  Visit Date/time: 05/21/2014     Date of Birth: February 07, 1950    CHIEF COMPLAINT:    Caitlin Wiley is a 64 year old female who is here today for review of multiple medical problems.    PROBLEMS TO BE ADDRESSED ON TODAYS VISIT:  (N64.4) Breast tenderness in female, right  (primary encounter diagnosis)    HPI:  History provided by patient.    Caitlin Wiley is a 64 year old female who is here today for review of multiple medical problems. The patient reports R sided breast pain, which began on Friday. She states that there was an erythematous area to the R breast, adjacent to the nipple, which was the size of a quarter. Associated symptoms included edema, warmth, and a "full" sensation to the R breast. Denies fluid drainage from the R nipple. Since onset, the patient does state that the symptoms have improved, though she does state that the R breast is still tender. On 05/20/14, I did call in a two day course of Keflex 500 mg 4 times a day for the patient. She has not picked up this prescription yet. Mammogram performed on 11/30/13, resulting in no mammographic features of malignancy. Prior to onset of her symptoms, she states that she may have injured her R breast on a seatbelt in the car.       Past Medical History:  As cited in EHR, updated and reviewed by myself on 05/21/2014    Family History:  As cited in EHR, updated and reviewed by myself on 05/21/2014    Medications/Allergies:  As cited in EHR, updated and reviewed by myself on 05/21/2014    REVIEW OF SYSTEMS:    BREAST: pt reports R breast pain, warmth, edema, and erythema     PHYSICAL EXAM:  BP 110/80 mmHg  Pulse 64  Ht 5' 6.5" (1.689 m)  Wt 301 lb (136.533 kg)  BMI 47.86 kg/m2  Physical Examination-female: Vital Signs as noted above.    General Appearance and Mental Status: Well Developed/Well Nourished  [ ]  Slender  [ ]  Average  [ ]  Heavy [x ]  Obese  [x ] Appropriate Affect, Oriented to Person, Place, Time      Skin: Warm, dry to touch    Head: Normocephalic, atraumatic    Eyes: EOMI, PERLA    Ears: TM's clear bilaterally    Nose: Septum intact, no lesions, pink mucosa and turbinates, no discharge    Pharynx: Clear    Neck: Supple, without JVD  or goiter    Chest: Clear     CV: Heart: RRR, no murmur, no rub, no S3, S4 noted    Breasts: 3 cm inferior to the R nipple and just medial to it, there is an area of tenderness and slightly firmer tissue. No discrete mass is felt. The patient states that this area was red and warm to touch yesterday.  A female, Judson Roch, was present    Extrem: no edema      ASSESSMENT AND PLAN:  Pertinent Labs & Imaging studies reviewed. (See chart for details)  Prior EMR records reviewed in EPIC as available and clinically relevant.    1. Breast tenderness in female, right  The patient reports R breast pain, erythema, edema, and warmth. The symptoms began on Friday. I will order a bilateral diagnostic mammogram and a R breast US to rule out  malignancy. I will have the patient see a breast specialist for further evaluation regarding her pain. I will cover her for possible mastitis with a course of Cefdinir 300 mg bid #14. She is not to take the Keflex. The patient is to let me know if her symptoms persist or worsen. The patient agrees, no further questions at this time.   - Elsa, Dr. Rexene Edison or Dr. Garnet Koyanagi  - Cefdinir 300 MG Oral Cap; Take 1 capsule (300 mg) by mouth every 12 hours.  Dispense: 14 capsule; Refill: 0  - MAMMOGRAM, BOTH BREASTS DIAGNOSTIC  - US EXAM, BREAST(S)    This appointment took 15 minutes and more than 50% was spent in counseling and/or coordination of care     Further treatment dependant on the results of the tests.    Seek immediate attention if symptoms are worsening.    Call if symptoms are not resolving with therapy.    Portions of this chart were written by Dineen Kid, Medical Scribe  with oversight by Ricki Miller, MD 05/21/2014 11:15 AM     I, Ricki Miller, have reviewed the information recorded by the scribe for accuracy and agree with its contents.    This medical record has been electronically generated with voice recognition software. Every attempt is made to assure accuracy.   However. it may contain errors related to this process.

## 2014-05-21 NOTE — Progress Notes (Signed)
I, Ricki Miller, MD interviewed and examined the patient while overseeing the documentation performed by my Medical Scribe, Dineen Kid. I have reviewed and revised as necessary the scribe's note and agree with the documented findings and plan of care. Please refer to the scribe's note below for further detailed information regarding the patient encounter and exam.  05/21/2014. 1:01 PM.

## 2014-05-30 NOTE — Progress Notes (Signed)
AVAREY, YAEGER          [id number]          05/02/2014    IDENTIFICATION AND CHIEF COMPLAINT     [name] is a middle-aged lady with new onset of right temporal headaches.    Interim history:    Per patient statement, she continues to experience recurrent episodes of jolts of pain localized to the right temple.   Symptoms are less frequent and not associated with any other neurological complaints.    REVIEW OF SYSTEMS     Please refer to history of present illness, all other systems reviewed and negative.    Past Medical History    Past Medical History   Diagnosis Date   . Muscle cramps 04/2011   . Leukocytosis    . Osteoarthritis      chronic   . GERD (gastroesophageal reflux disease)    . Anxiety    . Cervical disc disease    . Pain of toe of right foot 02/2010     5th toe       Past Surgical History    Past Surgical History   Procedure Laterality Date   . Colonoscopy flx dx w/collj spec when pfrmd  11/2005   . Surgical hx other  08/1999     knee surgery       Family History: per EMR documentation, reviewed with the patient.    Family History   Problem Relation Age of Onset   . Arthritis Mother    . Breast Cancer Mother      & tongue cancer   . Mental Illness Mother    . Stroke Mother      possible   . Hypertension Mother    . Anesthesia Problems Mother    . Arthritis Father    . Cancer Father    . Diabetes Father    . Colon Polyps Father    . Heart Disease Father      torn valve 01/1999   . Hypertension Father    . Stroke Father    . Asthma       and/or allergies - grandfather   . Cancer       grandparents   . Heart Disease Maternal Grandfather    . Heart Disease Paternal Grandfather    . Tuberculosis       great-grandmother   . Arthritis       grandmother   . Breast Cancer       grandmother   . Mental Illness Brother    . Lupus Aunt/Uncle      great-aunt       Allergies    Review of patient's allergies indicates:  Allergies   Allergen Reactions   . Bee Venom    . Cats [Animals]    .  Ciprofloxacin      Joint aching   . Dust Mite Extract      DUST   . Pollen Extract      POLLEN   . Sulfa Antibiotics        Current Medications    Current Outpatient Prescriptions   Medication Sig Dispense Refill   . Albuterol Sulfate HFA (PROAIR HFA) 108 (90 BASE) MCG/ACT Inhalation Aero Soln Inhale 1-2 puffs by mouth 4 times a day as needed (SOB). 1 Inhaler 1   . Ascorbic Acid (VITAMIN C OR) 500 mg     . ASPIRIN OR 81 mg     .  CALCIUM OR 600 mg     . Cefdinir 300 MG Oral Cap Take 1 capsule (300 mg) by mouth every 12 hours. 14 capsule 0   . Clotrimazole-Betamethasone 1-0.05 % External Cream APPLY TO AFFECTED AREA(S) TWO TIMES DAILY 30 g 0   . Flaxseed, Linseed, (FLAXSEED OIL) 1200 MG Oral Cap None Entered     . Fluticasone Propionate  HFA (FLOVENT HFA) 110 MCG/ACT Inhalation Aerosol INHALE TWO PUFFS BY MOUTH TWICE DAILY - RINSE MOUTH AFTER USE 1 Inhaler 3   . Fluticasone Propionate 50 MCG/ACT Nasal Suspension Spray 2 sprays into each nostril daily. 1 Inhaler 3   . GLUCOSAMINE CHONDROITIN COMPLX OR      . Lansoprazole 30 MG Oral CAPSULE DELAYED RELEASE Take 1 capsule (30 mg) by mouth daily. 32 capsule 6   . Magnesium 400 MG Oral Cap Take 400 mg by mouth.     . Montelukast Sodium 10 MG Oral Tab Take 1 tablet (10 mg) by mouth daily. 30 tablet 6   . Multiple Vitamins-Minerals (MULTIVITAMIN OR) no iron     . Probiotic Product (PROBIOTIC DAILY OR)      . Sotalol HCl 80 MG Oral Tab Take 1 tablet (80 mg) by mouth 2 times a day. 60 tablet 12   . Triamterene-HCTZ 37.5-25 MG Oral Tab Take 1 tablet by mouth daily. 34 tablet 6   . Warfarin Sodium (COUMADIN) 5 MG Oral Tab 7.5 mg X 2 days a week and 5mg  X 5 days a week or as instructed by protime clinic. 110 tablet 0     No current facility-administered medications for this visit.     Blood pressure 140/83, pulse 62, weight 306 lb (138.801 kg).     Exam:  GENERAL: Awake, alert, oriented , in no apparent distress.  HEENT: Normocephalic, atraumatic. FROM.  EYES: sclera none  icteric  RESPIRATORY: Breathing comfortably on room air.  ABDOMEN: nondistended.  SKIN: no rash.  Neuro: speech normal, mental status intact, thoughts coherent, Cranial nerves 2-12 grossly intact to observation, no focal deficits, gait normal.    Diagnostic workup reviewed:    I have reviewed with the patient brain MRI imaging and demonstrated normal findings.  Study was completed on 04/10/14.      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  I explained long-term prognosis and treatment options.     Discussed with the patient and all questioned fully answered. She will call me if any problems arise.    Total time spend  25  minutes, over 50 % of the time was spend on face to face counseling and coordinating of care.

## 2014-05-31 ENCOUNTER — Encounter (INDEPENDENT_AMBULATORY_CARE_PROVIDER_SITE_OTHER): Payer: Self-pay

## 2014-06-05 ENCOUNTER — Ambulatory Visit: Payer: No Typology Code available for payment source | Admitting: Registered"

## 2014-06-05 ENCOUNTER — Encounter (INDEPENDENT_AMBULATORY_CARE_PROVIDER_SITE_OTHER): Payer: Self-pay

## 2014-06-05 VITALS — Wt 300.4 lb

## 2014-06-05 DIAGNOSIS — E7800 Pure hypercholesterolemia, unspecified: Secondary | ICD-10-CM

## 2014-06-05 DIAGNOSIS — I10 Essential (primary) hypertension: Secondary | ICD-10-CM | POA: Insufficient documentation

## 2014-06-05 NOTE — Progress Notes (Signed)
[facility]     Note type:  Reassessment    Chief Complaint   Patient presents with   . Hyperlipidemia   . Atrial Fibrillation   . Weight Problem   . Blood Pressure       Direct time with patient: 30 mins        Assessment    (E78.0) Hypercholesterolemia  (primary encounter diagnosis)  (E66.01,  Z68.43) Morbid obesity with BMI of 50.0-59.9, adult  (I48.0) Paroxysmal atrial fibrillation    Wt 300 lb 6.4 oz (136.261 kg)    Body mass index is 47.77 kg/(m^2).      Results for orders placed or performed in visit on 10/03/13   COMPREHENSIVE METABOLIC PANEL   Result Value Ref Range    Sodium 134 (L) 135 - 145 mEq/L    Potassium 3.3 (L) 3.6 - 5.2 mEq/L    Chloride 102 98 - 108 mEq/L    Carbon Dioxide, Total 23 22 - 32 mEq/L    Anion Gap 9 4 - 12    Glucose 102 62 - 125 mg/dL    Urea Nitrogen 26 (H) 8 - 21 mg/dL    Creatinine 0.96 0.38 - 1.02 mg/dL    Protein (Total) 6.3 6.0 - 8.2 g/dL    Albumin 3.9 3.5 - 5.2 g/dL    Bilirubin (Total) 0.6 0.2 - 1.3 mg/dL    Calcium 8.8 (L) 8.9 - 10.2 mg/dL    AST (GOT) 18 9 - 38 U/L    Alkaline Phosphatase (Total) 76 31 - 132 U/L    ALT (GPT) 19 7 - 33 U/L    GFR, Calc, European American 59 (L) >59 mL/min    GFR, Calc, African American >60 >59 mL/min    GFR, Information       Calculated GFR in mL/min/1.73 m2 by MDRD equation.  Inaccurate with changing renal function.  See http://depts.[email or web address].html        Results for orders placed or performed in visit on 07/31/12   LIPID PANEL   Result Value Ref Range    Cholesterol (Total) 201 (H) <200 mg/dL    Triglyceride 111 <150 mg/dL    Cholesterol (HDL) 60 >40 mg/dL    Cholesterol (LDL) 119 <130 mg/dL    Non-HDL Cholesterol 141 0 - 159 mg/dL    Cholesterol/HDL Ratio 3.4     Lipid Panel, Additional Info.         (NOTE)  For complete information to help interpret the lipid   panel, please   use the National Cholesterol Education Program (NCEP)   guideline based   reference range comments found  here:  http://web.labmed.[employer].nl       Results for orders placed or performed in visit on 09/25/13   THYROID STIMULATING HORMONE   Result Value Ref Range    Thyroid Stimulating Hormone 3.083 0.400 - 5.000 uIU/mL       Current Outpatient Prescriptions   Medication Sig Dispense Refill   . Albuterol Sulfate HFA (PROAIR HFA) 108 (90 BASE) MCG/ACT Inhalation Aero Soln Inhale 1-2 puffs by mouth 4 times a day as needed (SOB). 1 Inhaler 1   . Ascorbic Acid (VITAMIN C OR) 500 mg     . ASPIRIN OR 81 mg     . CALCIUM OR 600 mg     . Cefdinir 300 MG Oral Cap Take 1 capsule (300 mg) by mouth every 12 hours. 14 capsule 0   . Clotrimazole-Betamethasone 1-0.05 % External Cream APPLY TO AFFECTED AREA(S)  TWO TIMES DAILY 30 g 0   . Flaxseed, Linseed, (FLAXSEED OIL) 1200 MG Oral Cap None Entered     . Fluticasone Propionate  HFA (FLOVENT HFA) 110 MCG/ACT Inhalation Aerosol INHALE TWO PUFFS BY MOUTH TWICE DAILY - RINSE MOUTH AFTER USE 1 Inhaler 3   . Fluticasone Propionate 50 MCG/ACT Nasal Suspension Spray 2 sprays into each nostril daily. 1 Inhaler 3   . GLUCOSAMINE CHONDROITIN COMPLX OR      . Lansoprazole 30 MG Oral CAPSULE DELAYED RELEASE Take 1 capsule (30 mg) by mouth daily. 32 capsule 6   . Magnesium 400 MG Oral Cap Take 400 mg by mouth.     . Montelukast Sodium 10 MG Oral Tab Take 1 tablet (10 mg) by mouth daily. 30 tablet 6   . Multiple Vitamins-Minerals (MULTIVITAMIN OR) no iron     . Probiotic Product (PROBIOTIC DAILY OR)      . Sotalol HCl 80 MG Oral Tab Take 1 tablet (80 mg) by mouth 2 times a day. 60 tablet 12   . Triamterene-HCTZ 37.5-25 MG Oral Tab Take 1 tablet by mouth daily. 34 tablet 6   . Warfarin Sodium (COUMADIN) 5 MG Oral Tab 7.5 mg X 2 days a week and 5mg  X 5 days a week or as instructed by protime clinic. 110 tablet 0     No current facility-administered medications for this visit.       Vitamins/Minerals/Herbal/Supplements: Tumeric, magnesium, multivitamin, glucosamine,  chondroitin, flax oil, vitamin D, fiber supplement, Emergen-C, probiotic        History   Substance Use Topics   . Smoking status: Never Smoker    . Smokeless tobacco: Never Used   . Alcohol Use: 0.0 oz/week     0 Standard drinks or equivalent per week       Family History   Problem Relation Age of Onset   . Arthritis Mother    . Breast Cancer Mother      & tongue cancer   . Mental Illness Mother    . Stroke Mother      possible   . Hypertension Mother    . Anesthesia Problems Mother    . Arthritis Father    . Cancer Father    . Diabetes Father    . Colon Polyps Father    . Heart Disease Father      torn valve 01/1999   . Hypertension Father    . Stroke Father    . Asthma       and/or allergies - grandfather   . Cancer       grandparents   . Heart Disease Maternal Grandfather    . Heart Disease Paternal Grandfather    . Tuberculosis       great-grandmother   . Arthritis       grandmother   . Breast Cancer       grandmother   . Mental Illness Brother    . Lupus Aunt/Uncle      great-aunt       Activity/Exercise:  Not much-only adult daily living activities    Diet History:   Food intolerance/allergies: possible sensitivity to sulfites, more joint pain with potato and tomatoes  Diet recall: Reviewed with patient     Information from Patient: Was in bed part of the month due to knee and back issues.  During that time it was difficult to stand and cook.  Now feeling better.  Had more appetite yesterday.  Says current  weight is the lowest she has been in 10 years.  Has been practicing her physical therapy exercises.  Didn't try Sit and Be Fit.  Has cleaned out her pantry.  Is keeping better foods in the house.  Has noticed she feels better when she eats less dairy.        Nutrition Diagnosis: Food and nut related knowledge deficit  related to various health concerns and weight evidenced by comments      Intervention  Medical Nutrition Therapy: for weight control to improve heart health and possibly control atrial  fibrillation.  Discussed patient's changes and challenges. Weight is down almost 6 pounds from last month.  Encouraged her to be more active now that she is feeling better.  Continue to practice PT exercises and try Sit and Be Fit. Commended her for her weight loss and for removing less healthy foods from her house.  Discussed portion control and resuming healthy eating.  Is now using probiotics.  May need to avoid dairy products.    Patient stated goals: Weight loss, control excema  Nutrition topics taught: Diet:   anti-inflammatory, weight control tips, probiotics  Education Materials Provided: As appropriate to medical nutrition therapy    Monitoring/Evaluation: weigh at clinic visits     Plan/Recommendations: Implement some of the changes discussed.  Plan to continue work on weight control.     Follow up: In 1 month(s)      Ishmael Holter, RDN, CD, CDE  Registered Dietitian/Nutritionist  Certified Diabetes Educator

## 2014-06-11 ENCOUNTER — Ambulatory Visit: Payer: No Typology Code available for payment source

## 2014-06-11 DIAGNOSIS — I4891 Unspecified atrial fibrillation: Secondary | ICD-10-CM | POA: Insufficient documentation

## 2014-06-11 LAB — PR PROTHROMBIN TIME, ONSITE: prothrombin INR: 2.3

## 2014-06-12 NOTE — Progress Notes (Signed)
No change in Warfarin dose.  Barb Crane,  RN  [place] [facility] [place]

## 2014-06-14 ENCOUNTER — Other Ambulatory Visit (INDEPENDENT_AMBULATORY_CARE_PROVIDER_SITE_OTHER): Payer: Self-pay

## 2014-06-14 DIAGNOSIS — K219 Gastro-esophageal reflux disease without esophagitis: Secondary | ICD-10-CM

## 2014-06-14 MED ORDER — LANSOPRAZOLE 30 MG OR CPDR
30.0000 mg | DELAYED_RELEASE_CAPSULE | Freq: Every day | ORAL | Status: DC
Start: 2014-06-14 — End: 2015-02-10

## 2014-06-14 NOTE — Telephone Encounter (Signed)
Refill Request    Last visit with PCP: 05/21/14  Next visit: none  Labs: 02/18/14    Last refill: 10/18/13 # 32 + 6  Letter Sent: NO  Diagnosis: Gastroesophageal reflux disease, esophagitis presence not specified

## 2014-07-01 ENCOUNTER — Other Ambulatory Visit (HOSPITAL_BASED_OUTPATIENT_CLINIC_OR_DEPARTMENT_OTHER): Payer: Self-pay

## 2014-07-01 DIAGNOSIS — I480 Paroxysmal atrial fibrillation: Secondary | ICD-10-CM

## 2014-07-01 MED ORDER — WARFARIN SODIUM 5 MG OR TABS
ORAL_TABLET | ORAL | Status: DC
Start: 2014-07-01 — End: 2014-10-03

## 2014-07-03 ENCOUNTER — Ambulatory Visit: Payer: No Typology Code available for payment source | Admitting: Registered"

## 2014-07-03 VITALS — Wt 300.8 lb

## 2014-07-03 DIAGNOSIS — I10 Essential (primary) hypertension: Secondary | ICD-10-CM

## 2014-07-03 DIAGNOSIS — I482 Chronic atrial fibrillation, unspecified: Secondary | ICD-10-CM

## 2014-07-03 DIAGNOSIS — E7800 Pure hypercholesterolemia, unspecified: Secondary | ICD-10-CM

## 2014-07-03 NOTE — Progress Notes (Signed)
[facility]     Note type:  Reassessment    Chief Complaint   Patient presents with   . Weight Problem   . Hyperlipidemia   . Blood Pressure   . Atrial Fibrillation       Direct time with patient: 30 mins        Assessment    (E78.0) Hypercholesterolemia  (primary encounter diagnosis)  (E66.01,  Z68.43) Morbid obesity with BMI of 40.0-49.9, adult  (I48.0) Paroxysmal atrial fibrillation    Wt 300 lb 12.8 oz (136.442 kg)    Body mass index is 47.83 kg/(m^2).      Results for orders placed or performed in visit on 10/03/13   COMPREHENSIVE METABOLIC PANEL   Result Value Ref Range    Sodium 134 (L) 135 - 145 mEq/L    Potassium 3.3 (L) 3.6 - 5.2 mEq/L    Chloride 102 98 - 108 mEq/L    Carbon Dioxide, Total 23 22 - 32 mEq/L    Anion Gap 9 4 - 12    Glucose 102 62 - 125 mg/dL    Urea Nitrogen 26 (H) 8 - 21 mg/dL    Creatinine 0.96 0.38 - 1.02 mg/dL    Protein (Total) 6.3 6.0 - 8.2 g/dL    Albumin 3.9 3.5 - 5.2 g/dL    Bilirubin (Total) 0.6 0.2 - 1.3 mg/dL    Calcium 8.8 (L) 8.9 - 10.2 mg/dL    AST (GOT) 18 9 - 38 U/L    Alkaline Phosphatase (Total) 76 31 - 132 U/L    ALT (GPT) 19 7 - 33 U/L    GFR, Calc, European American 59 (L) >59 mL/min    GFR, Calc, African American >60 >59 mL/min    GFR, Information       Calculated GFR in mL/min/1.73 m2 by MDRD equation.  Inaccurate with changing renal function.  See http://depts.[email or web address]        Results for orders placed or performed in visit on 07/31/12   LIPID PANEL   Result Value Ref Range    Cholesterol (Total) 201 (H) <200 mg/dL    Triglyceride 111 <150 mg/dL    Cholesterol (HDL) 60 >40 mg/dL    Cholesterol (LDL) 119 <130 mg/dL    Non-HDL Cholesterol 141 0 - 159 mg/dL    Cholesterol/HDL Ratio 3.4     Lipid Panel, Additional Info.         (NOTE)  For complete information to help interpret the lipid   panel, please   use the National Cholesterol Education Program (NCEP)   guideline based   reference range comments found  here:  http://web.labmed.[facility].nl       Results for orders placed or performed in visit on 09/25/13   THYROID STIMULATING HORMONE   Result Value Ref Range    Thyroid Stimulating Hormone 3.083 0.400 - 5.000 uIU/mL       Current Outpatient Prescriptions   Medication Sig Dispense Refill   . Albuterol Sulfate HFA (PROAIR HFA) 108 (90 BASE) MCG/ACT Inhalation Aero Soln Inhale 1-2 puffs by mouth 4 times a day as needed (SOB). 1 Inhaler 1   . Ascorbic Acid (VITAMIN C OR) 500 mg     . ASPIRIN OR 81 mg     . CALCIUM OR 600 mg     . Cefdinir 300 MG Oral Cap Take 1 capsule (300 mg) by mouth every 12 hours. 14 capsule 0   . Clotrimazole-Betamethasone 1-0.05 % External Cream APPLY TO AFFECTED AREA(S)  TWO TIMES DAILY 30 g 0   . Flaxseed, Linseed, (FLAXSEED OIL) 1200 MG Oral Cap None Entered     . Fluticasone Propionate  HFA (FLOVENT HFA) 110 MCG/ACT Inhalation Aerosol INHALE TWO PUFFS BY MOUTH TWICE DAILY - RINSE MOUTH AFTER USE 1 Inhaler 3   . Fluticasone Propionate 50 MCG/ACT Nasal Suspension Spray 2 sprays into each nostril daily. 1 Inhaler 3   . GLUCOSAMINE CHONDROITIN COMPLX OR      . Lansoprazole 30 MG Oral CAPSULE DELAYED RELEASE Take 1 capsule (30 mg) by mouth daily. 32 capsule 6   . Magnesium 400 MG Oral Cap Take 400 mg by mouth.     . Montelukast Sodium 10 MG Oral Tab Take 1 tablet (10 mg) by mouth daily. 30 tablet 6   . Multiple Vitamins-Minerals (MULTIVITAMIN OR) no iron     . Probiotic Product (PROBIOTIC DAILY OR)      . Sotalol HCl 80 MG Oral Tab Take 1 tablet (80 mg) by mouth 2 times a day. 60 tablet 12   . Triamterene-HCTZ 37.5-25 MG Oral Tab Take 1 tablet by mouth daily. 34 tablet 6   . Warfarin Sodium (COUMADIN) 5 MG Oral Tab Take 7.5mg  X 2 days a week (T,Th) and 5mg  X 5 days a week or as instructed by your physician. 110 tablet 0     No current facility-administered medications for this visit.       Vitamins/Minerals/Herbal/Supplements: Tumeric, magnesium, multivitamin, glucosamine,  chondroitin, flax oil, vitamin D, fiber supplement, Emergen-C, probiotic        History   Substance Use Topics   . Smoking status: Never Smoker    . Smokeless tobacco: Never Used   . Alcohol Use: 0.0 oz/week     0 Standard drinks or equivalent per week       Family History   Problem Relation Age of Onset   . Arthritis Mother    . Breast Cancer Mother      & tongue cancer   . Mental Illness Mother    . Stroke Mother      possible   . Hypertension Mother    . Anesthesia Problems Mother    . Arthritis Father    . Cancer Father    . Diabetes Father    . Colon Polyps Father    . Heart Disease Father      torn valve 01/1999   . Hypertension Father    . Stroke Father    . Asthma       and/or allergies - grandfather   . Cancer       grandparents   . Heart Disease Maternal Grandfather    . Heart Disease Paternal Grandfather    . Tuberculosis       great-grandmother   . Arthritis       grandmother   . Breast Cancer       grandmother   . Mental Illness Brother    . Lupus Aunt/Uncle      great-aunt       Activity/Exercise:  Doing a small amount of walking for exercise.  Likes to walk at [employer].  Some gardening.  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    Diet History:   Food intolerance/allergies: possible sensitivity to sulfites, more joint pain with potato and tomatoes  Diet recall: Reviewed with patient     Information from Patient: Disappointed at lack of weight loss on clinic scale.  Thinks her weight was in the 320's  at this time last year.  Notes clothing fits better, is feeling better and finds it easier to walk.  Says food is moving better through her gut since she made dietary changes.  Tried Sit and Be Fit.  "Felt stupid" exercising with the program, but thinks she will continue with it as she needs the movement.  Has purchased some of the equipment (stretch band, exercise ball) used in the program and has recorded about 20 sessions.        Nutrition Diagnosis: Food and nut related knowledge deficit  related to various health  concerns and weight evidenced by comments      Intervention  Medical Nutrition Therapy: for weight control to improve heart health and possibly control atrial fibrillation.  Discussed patient's changes and challenges. Weight is about the same as last month.  Commended her for her weight loss over the past year.  Discussed the changes she has noticed, including clothing fitting better, feeling better and walking better.  Encouraged her to be more active now that she is feeling better and able to walk more.  Continue to practice PT and Sit and Be Fit exercises.  Discussed weight loss pattern.  Patient seems to lose one month, maintains the next month, then drops several pounds.  Encouraged her to focus her other health improvements and to continue with her healthy eating.  Increase intake of fluids.  Dietary changes have improved the regularity of her bowel movements.  Continue with portion control/    Patient stated goals: Weight loss, control excema  Nutrition topics taught: Diet:   anti-inflammatory, weight control tips, probiotics  Education Materials Provided: As appropriate to medical nutrition therapy    Monitoring/Evaluation: weigh at clinic visits     Plan/Recommendations: Implement some of the changes discussed.  Plan to continue work on weight control.     Follow up: In 1 month(s)      Ishmael Holter, RDN, CD, CDE  Registered Dietitian/Nutritionist  Certified Diabetes Educator

## 2014-07-12 ENCOUNTER — Ambulatory Visit: Payer: No Typology Code available for payment source

## 2014-07-12 DIAGNOSIS — I4891 Unspecified atrial fibrillation: Secondary | ICD-10-CM | POA: Insufficient documentation

## 2014-07-12 LAB — PR PROTHROMBIN TIME, ONSITE: prothrombin INR: 2.5

## 2014-07-12 NOTE — Progress Notes (Signed)
Instructed patient to continue Warfarin 7.5 mg x 2 days a week (T,Th) and 5 mg x 5 days a week and re check INR in four weeks.  Patient verbalized understanding of the plan.  Reviewed per MD protocol.    [place], RN

## 2014-07-14 ENCOUNTER — Other Ambulatory Visit (INDEPENDENT_AMBULATORY_CARE_PROVIDER_SITE_OTHER): Payer: Self-pay | Admitting: Internal Medicine

## 2014-07-14 DIAGNOSIS — R600 Localized edema: Secondary | ICD-10-CM

## 2014-07-14 DIAGNOSIS — R0602 Shortness of breath: Secondary | ICD-10-CM

## 2014-07-15 MED ORDER — VENTOLIN HFA 108 (90 BASE) MCG/ACT IN AERS
INHALATION_SPRAY | RESPIRATORY_TRACT | Status: DC
Start: 2014-07-15 — End: 2015-01-28

## 2014-07-15 MED ORDER — TRIAMTERENE-HCTZ 37.5-25 MG OR TABS
1.0000 | ORAL_TABLET | Freq: Every day | ORAL | Status: DC
Start: 2014-07-15 — End: 2014-09-02

## 2014-07-15 NOTE — Telephone Encounter (Signed)
Refill Request    Last visit with PCP: 05/16 Breast  Next visit: ?  Labs: 02/16    Last refill:  09/15  Letter Sent: NO  Diagnosis:  J45.20

## 2014-08-07 ENCOUNTER — Ambulatory Visit (HOSPITAL_BASED_OUTPATIENT_CLINIC_OR_DEPARTMENT_OTHER): Payer: No Typology Code available for payment source | Admitting: Registered"

## 2014-08-13 ENCOUNTER — Ambulatory Visit: Payer: No Typology Code available for payment source

## 2014-08-13 DIAGNOSIS — I4891 Unspecified atrial fibrillation: Secondary | ICD-10-CM | POA: Insufficient documentation

## 2014-08-13 LAB — PR PROTHROMBIN TIME, ONSITE: prothrombin INR: 2.4

## 2014-08-13 NOTE — Progress Notes (Signed)
Instructed patient to continue Warfarin 7.5 mg x 2 days (T,Th) and 5 mg x 5 days a week and re check INR in four weeks.  Reviewed per MD protocol.    [place], RN

## 2014-09-02 ENCOUNTER — Other Ambulatory Visit (INDEPENDENT_AMBULATORY_CARE_PROVIDER_SITE_OTHER): Payer: Self-pay | Admitting: Internal Medicine

## 2014-09-02 DIAGNOSIS — R600 Localized edema: Secondary | ICD-10-CM

## 2014-09-02 DIAGNOSIS — J4520 Mild intermittent asthma, uncomplicated: Secondary | ICD-10-CM

## 2014-09-02 NOTE — Telephone Encounter (Signed)
Refill Request    Last visit with PCP: 5/16  breast  Next visit: ?  Labs: 2/16    Last refill: 2/16  Letter Sent: YES  Diagnosis:   J45.20  Asthma  htn   I10

## 2014-09-03 MED ORDER — TRIAMTERENE-HCTZ 37.5-25 MG OR TABS
ORAL_TABLET | ORAL | Status: DC
Start: 2014-09-03 — End: 2014-10-31

## 2014-09-03 MED ORDER — MONTELUKAST SODIUM 10 MG OR TABS
10.0000 mg | ORAL_TABLET | Freq: Every day | ORAL | Status: DC
Start: 2014-09-03 — End: 2014-10-31

## 2014-09-09 ENCOUNTER — Ambulatory Visit: Payer: No Typology Code available for payment source

## 2014-09-09 ENCOUNTER — Ambulatory Visit (HOSPITAL_BASED_OUTPATIENT_CLINIC_OR_DEPARTMENT_OTHER): Payer: No Typology Code available for payment source

## 2014-09-09 ENCOUNTER — Encounter (HOSPITAL_BASED_OUTPATIENT_CLINIC_OR_DEPARTMENT_OTHER): Payer: Self-pay

## 2014-09-09 VITALS — BP 112/72 | HR 71 | Wt 300.4 lb

## 2014-09-09 DIAGNOSIS — I482 Chronic atrial fibrillation, unspecified: Secondary | ICD-10-CM

## 2014-09-09 LAB — PR PROTHROMBIN TIME, ONSITE: prothrombin INR: 2.6

## 2014-09-09 NOTE — Progress Notes (Signed)
I have reviewed the patient's medical history in detail and updated the computerized patient record.

## 2014-09-09 NOTE — Progress Notes (Signed)
Continue 7.5 mg X 2 days a week (T,Th) and 5mg  X 5 days and re check INR in 4 weeks.

## 2014-09-09 NOTE — Patient Instructions (Signed)
Continue 7.5 mg X 2 days a week (T,Th) and 5mg  X 5 days and re check INR in 4 weeks.

## 2014-09-09 NOTE — Progress Notes (Signed)
CARDIOLOGY PROGRESS NOTE    PATIENT:  Caitlin Wiley  DATE OF BIRTH:  Apr 27, 1950  DATE OF SERVICE:  09/09/2014   PRIMARY CARE PHYSICIAN:   Salley Slaughter, MD (General)    PROBLEM LIST:    Patient Active Problem List   Diagnosis   . Asthma   . Hypercholesterolemia   . Hx of colonic polyps--Dr Read--03/21/10  cleared for 5 years   . Edema   . Sleep apnea   . HTN (hypertension)   . Menopausal state   . Rash   . Pulmonary nodule   . Grief reaction   . Strain of thoracic spine   . OA (osteoarthritis)   . GERD (gastroesophageal reflux disease)   . Palpitations   . Chronic atrial fibrillation (HCC)       HISTORY OF PRESENT ILLNESS:   The patient is seen today in follow up of atrial fibrillation.  She is discouraged that her weight loss has not been more productive.  She remains 300 pounds on my scale.  She no longer sees the dietitian.  She's not having problems with the atrial fibrillation rate is well-controlled at 71 today.  She says she treats her sleep apnea.      REVIEW OF SYSTEMS:   The complete review of systems is updated.    MEDICATIONS:    Current Outpatient Prescriptions   Medication Sig Dispense Refill   . Ascorbic Acid (VITAMIN C OR) 500 mg     . ASPIRIN OR 81 mg     . CALCIUM OR 600 mg     . Cefdinir 300 MG Oral Cap Take 1 capsule (300 mg) by mouth every 12 hours. 14 capsule 0   . Clotrimazole-Betamethasone 1-0.05 % External Cream APPLY TO AFFECTED AREA(S) TWO TIMES DAILY 30 g 0   . Flaxseed, Linseed, (FLAXSEED OIL) 1200 MG Oral Cap None Entered     . Fluticasone Propionate  HFA (FLOVENT HFA) 110 MCG/ACT Inhalation Aerosol INHALE TWO PUFFS BY MOUTH TWICE DAILY - RINSE MOUTH AFTER USE 1 Inhaler 3   . Fluticasone Propionate 50 MCG/ACT Nasal Suspension Spray 2 sprays into each nostril daily. 1 Inhaler 3   . GLUCOSAMINE CHONDROITIN COMPLX OR      . Lansoprazole 30 MG Oral CAPSULE DELAYED RELEASE Take 1 capsule (30 mg) by mouth daily. 32 capsule 6   . Magnesium 400 MG Oral Cap Take 400 mg by mouth.     .  Montelukast Sodium 10 MG Oral Tab Take 1 tablet (10 mg) by mouth daily. 34 tablet 1   . Multiple Vitamins-Minerals (MULTIVITAMIN OR) no iron     . Probiotic Product (PROBIOTIC DAILY OR)      . Sotalol HCl 80 MG Oral Tab Take 1 tablet (80 mg) by mouth 2 times a day. 60 tablet 12   . Triamterene-HCTZ 37.5-25 MG Oral Tab TAKE ONE TABLET BY MOUTH ONCE DAILY 34 tablet 1   . VENTOLIN HFA 108 (90 BASE) MCG/ACT Inhalation Aero Soln INHALE 1-2 PUFFS FOUR TIMES DAILY AS NEEDED FOR SHORTNESS OF BREATH 18 g 0   . Warfarin Sodium (COUMADIN) 5 MG Oral Tab Take 7.5mg  X 2 days a week (T,Th) and 5mg  X 5 days a week or as instructed by your physician. 110 tablet 0     No current facility-administered medications for this visit.       PHYSICAL EXAM:   On exam today, she looks well.  Blood pressure 112/72, pulse 71, weight 300 lb 6.4  oz (136.261 kg), SpO2 96 %.  Weight is as noted in flow sheet.  Pulse is irregular. JVD is less than five. The carotid upstrokes are normal and equal bilaterally without bruits. The apex beat is not palpable. Heart sounds S1 and S2 are normal. There are no added sounds and no murmurs. The lungs are clear. There is no lower extremity edema.     IMPRESSION: Stable atrial fibrillation.  Encourage weight loss.  See me again in one year.    [name], MD, [facility]  JP/jm    CC:  Salley Slaughter, MD (General)           Patient Care Team:  Hagedorn, Ashok Norris, MD as PCP - General ([place])  Laurena Spies, MD (CARDIOLOGY MEDICARE)  Ronalee Red, RD as Dietitian ([place])

## 2014-09-09 NOTE — Patient Instructions (Signed)
Stay on the medications    See me in one month

## 2014-10-01 ENCOUNTER — Ambulatory Visit: Payer: No Typology Code available for payment source

## 2014-10-01 DIAGNOSIS — I482 Chronic atrial fibrillation, unspecified: Secondary | ICD-10-CM

## 2014-10-01 LAB — PR PROTHROMBIN TIME, ONSITE: prothrombin INR: 1.8

## 2014-10-01 NOTE — Patient Instructions (Signed)
Extra 2.5mg  tonight. Continue 7.5 mg X 2 days a week (T,Th) and 5mg  X 5 days and re check INR in 2 weeks.

## 2014-10-01 NOTE — Progress Notes (Signed)
Agree 

## 2014-10-01 NOTE — Progress Notes (Signed)
Extra 2.5mg  tonight. Continue 7.5 mg X 2 days a week (T,Th) and 5mg  X 5 days and re check INR in 2 weeks.

## 2014-10-03 ENCOUNTER — Other Ambulatory Visit (HOSPITAL_BASED_OUTPATIENT_CLINIC_OR_DEPARTMENT_OTHER): Payer: Self-pay

## 2014-10-03 DIAGNOSIS — I480 Paroxysmal atrial fibrillation: Secondary | ICD-10-CM

## 2014-10-03 MED ORDER — WARFARIN SODIUM 5 MG OR TABS
ORAL_TABLET | ORAL | Status: DC
Start: 2014-10-03 — End: 2014-12-27

## 2014-10-03 MED ORDER — SOTALOL HCL 80 MG OR TABS
80.0000 mg | ORAL_TABLET | Freq: Two times a day (BID) | ORAL | Status: DC
Start: 2014-10-03 — End: 2015-02-07

## 2014-10-15 ENCOUNTER — Encounter (HOSPITAL_BASED_OUTPATIENT_CLINIC_OR_DEPARTMENT_OTHER): Payer: No Typology Code available for payment source

## 2014-10-15 ENCOUNTER — Ambulatory Visit: Payer: No Typology Code available for payment source | Admitting: Unknown Physician Specialty

## 2014-10-15 DIAGNOSIS — I482 Chronic atrial fibrillation, unspecified: Secondary | ICD-10-CM

## 2014-10-15 LAB — PR PROTHROMBIN TIME, ONSITE: prothrombin INR: 2.2

## 2014-10-29 NOTE — Progress Notes (Signed)
Continue 7.5 mg X 2 days a week (T,Th) and 5mg  X 5 days and re check INR in 3 weeks.  Reviewed per MD protocol.    [place], RN

## 2014-10-31 ENCOUNTER — Other Ambulatory Visit: Payer: Self-pay

## 2014-10-31 DIAGNOSIS — J4520 Mild intermittent asthma, uncomplicated: Secondary | ICD-10-CM

## 2014-10-31 DIAGNOSIS — R600 Localized edema: Secondary | ICD-10-CM

## 2014-10-31 NOTE — Telephone Encounter (Signed)
The patient last received this medication at the requesting pharmacy on 10/02/14

## 2014-11-01 MED ORDER — TRIAMTERENE-HCTZ 37.5-25 MG OR TABS
1.0000 | ORAL_TABLET | Freq: Every day | ORAL | Status: DC
Start: 2014-11-01 — End: 2015-01-31

## 2014-11-01 MED ORDER — MONTELUKAST SODIUM 10 MG OR TABS
10.0000 mg | ORAL_TABLET | Freq: Every day | ORAL | Status: DC
Start: 2014-11-01 — End: 2015-01-31

## 2014-11-05 ENCOUNTER — Ambulatory Visit: Payer: No Typology Code available for payment source

## 2014-11-05 DIAGNOSIS — I482 Chronic atrial fibrillation, unspecified: Secondary | ICD-10-CM

## 2014-11-05 LAB — PR PROTHROMBIN TIME, ONSITE: prothrombin INR: 2.3

## 2014-11-05 NOTE — Progress Notes (Signed)
Continue 7.5 mg X 2 days a week (T,Th) and 5mg  X 5 days and re check INR in 4 weeks.      Reviewed per MD protocol.    Hayden Rasmussen, RN

## 2014-12-03 ENCOUNTER — Ambulatory Visit: Payer: No Typology Code available for payment source

## 2014-12-03 DIAGNOSIS — I482 Chronic atrial fibrillation, unspecified: Secondary | ICD-10-CM

## 2014-12-03 LAB — PR PROTHROMBIN TIME, ONSITE: prothrombin INR: 2.2

## 2014-12-03 NOTE — Progress Notes (Signed)
Continue 7.5 mg X 2 days a week (T,Th) and 5mg X 5 days and re check INR in 4 weeks.

## 2014-12-27 ENCOUNTER — Other Ambulatory Visit (HOSPITAL_BASED_OUTPATIENT_CLINIC_OR_DEPARTMENT_OTHER): Payer: Self-pay | Admitting: Internal Medicine

## 2014-12-27 DIAGNOSIS — I480 Paroxysmal atrial fibrillation: Secondary | ICD-10-CM

## 2014-12-30 MED ORDER — WARFARIN SODIUM 5 MG OR TABS
ORAL_TABLET | ORAL | Status: DC
Start: 2014-12-30 — End: 2015-03-05

## 2015-01-02 ENCOUNTER — Ambulatory Visit: Payer: No Typology Code available for payment source | Admitting: Unknown Physician Specialty

## 2015-01-02 DIAGNOSIS — I482 Chronic atrial fibrillation, unspecified: Secondary | ICD-10-CM

## 2015-01-02 LAB — PR PROTHROMBIN TIME, ONSITE: prothrombin INR: 3.1

## 2015-01-02 NOTE — Progress Notes (Signed)
Has been eating less greens.  Will go back to eating greens as usual, continue 7.5 mg X 2 days a week (T,Th) and 5mg  X 5 days and re check INR in 2-3 weeks.  Reviewed per MD protocol.  Routing to Dr. Ivan Croft.     [place], RN

## 2015-01-03 NOTE — Progress Notes (Signed)
Reviewed and agree.

## 2015-01-15 ENCOUNTER — Ambulatory Visit: Payer: No Typology Code available for payment source | Admitting: Unknown Physician Specialty

## 2015-01-15 DIAGNOSIS — I482 Chronic atrial fibrillation, unspecified: Secondary | ICD-10-CM

## 2015-01-15 LAB — PR PROTHROMBIN TIME, ONSITE: prothrombin INR: 2.4

## 2015-01-15 NOTE — Progress Notes (Signed)
Continue 7.5 mg X 2 days a week (T,Th) and 5mg  X 5 days and re check INR in 3-4 weeks.  Reviewed per MD protocol.    [place], RN

## 2015-01-28 ENCOUNTER — Other Ambulatory Visit (INDEPENDENT_AMBULATORY_CARE_PROVIDER_SITE_OTHER): Payer: Self-pay | Admitting: Internal Medicine

## 2015-01-28 DIAGNOSIS — R0602 Shortness of breath: Secondary | ICD-10-CM

## 2015-01-29 ENCOUNTER — Other Ambulatory Visit: Payer: Self-pay

## 2015-01-29 DIAGNOSIS — J4520 Mild intermittent asthma, uncomplicated: Secondary | ICD-10-CM

## 2015-01-29 MED ORDER — VENTOLIN HFA 108 (90 BASE) MCG/ACT IN AERS
INHALATION_SPRAY | RESPIRATORY_TRACT | Status: DC
Start: 2015-01-29 — End: 2015-02-05

## 2015-01-29 NOTE — Telephone Encounter (Signed)
The patient last received this medication at the requesting pharmacy on               07/14/14

## 2015-01-29 NOTE — Telephone Encounter (Signed)
Patient last seen > 1 year ago for [facility]; routine labs done 02/18/14   - Due for Wellness Exam  One refill authorized.   Front Desk/ PSR- Please contact pt to schedule

## 2015-01-30 MED ORDER — FLUTICASONE PROPIONATE HFA 110 MCG/ACT IN AERO
2.0000 | INHALATION_SPRAY | Freq: Two times a day (BID) | RESPIRATORY_TRACT | Status: DC
Start: 2015-01-30 — End: 2015-05-05

## 2015-01-31 ENCOUNTER — Other Ambulatory Visit: Payer: Self-pay

## 2015-01-31 DIAGNOSIS — J4520 Mild intermittent asthma, uncomplicated: Secondary | ICD-10-CM

## 2015-01-31 DIAGNOSIS — R600 Localized edema: Secondary | ICD-10-CM

## 2015-01-31 MED ORDER — TRIAMTERENE-HCTZ 37.5-25 MG OR TABS
1.0000 | ORAL_TABLET | Freq: Every day | ORAL | Status: DC
Start: 2015-01-31 — End: 2015-03-11

## 2015-01-31 MED ORDER — MONTELUKAST SODIUM 10 MG OR TABS
10.0000 mg | ORAL_TABLET | Freq: Every day | ORAL | Status: DC
Start: 2015-01-31 — End: 2015-03-11

## 2015-01-31 NOTE — Telephone Encounter (Signed)
The patient last received this medication at the requesting pharmacy on 10/02/14

## 2015-02-04 ENCOUNTER — Other Ambulatory Visit (HOSPITAL_BASED_OUTPATIENT_CLINIC_OR_DEPARTMENT_OTHER): Payer: Self-pay | Admitting: Internal Medicine

## 2015-02-04 NOTE — Telephone Encounter (Signed)
Received refill request for sotalol. Pt still has 9 months on active script. Refused at this time.

## 2015-02-05 ENCOUNTER — Ambulatory Visit: Payer: No Typology Code available for payment source

## 2015-02-05 ENCOUNTER — Other Ambulatory Visit (INDEPENDENT_AMBULATORY_CARE_PROVIDER_SITE_OTHER): Payer: Self-pay | Admitting: Internal Medicine

## 2015-02-05 DIAGNOSIS — I482 Chronic atrial fibrillation, unspecified: Secondary | ICD-10-CM

## 2015-02-05 DIAGNOSIS — R0602 Shortness of breath: Secondary | ICD-10-CM

## 2015-02-05 LAB — PR PROTHROMBIN TIME, ONSITE: prothrombin INR: 2.5

## 2015-02-05 NOTE — Progress Notes (Signed)
Continue 7.5 mg X 2 days a week (T,Th) and 5mg X 5 days and re check INR in 4 weeks.      Reviewed per MD protocol.    Courtney S Lee, RN

## 2015-02-06 MED ORDER — VENTOLIN HFA 108 (90 BASE) MCG/ACT IN AERS
INHALATION_SPRAY | RESPIRATORY_TRACT | Status: DC
Start: 2015-02-06 — End: 2015-05-05

## 2015-02-07 ENCOUNTER — Other Ambulatory Visit (HOSPITAL_BASED_OUTPATIENT_CLINIC_OR_DEPARTMENT_OTHER): Payer: Self-pay

## 2015-02-07 DIAGNOSIS — I480 Paroxysmal atrial fibrillation: Secondary | ICD-10-CM

## 2015-02-09 MED ORDER — SOTALOL HCL 80 MG OR TABS
80.0000 mg | ORAL_TABLET | Freq: Two times a day (BID) | ORAL | Status: DC
Start: 2015-02-09 — End: 2015-03-05

## 2015-02-10 ENCOUNTER — Other Ambulatory Visit: Payer: Self-pay

## 2015-02-10 DIAGNOSIS — K219 Gastro-esophageal reflux disease without esophagitis: Secondary | ICD-10-CM

## 2015-02-10 NOTE — Telephone Encounter (Signed)
The patient last received this medication at the requesting pharmacy on               02/14/14

## 2015-02-11 MED ORDER — LANSOPRAZOLE 30 MG OR CPDR
30.0000 mg | DELAYED_RELEASE_CAPSULE | Freq: Every day | ORAL | Status: DC
Start: 2015-02-11 — End: 2015-05-05

## 2015-03-05 ENCOUNTER — Encounter (HOSPITAL_BASED_OUTPATIENT_CLINIC_OR_DEPARTMENT_OTHER): Payer: Self-pay

## 2015-03-05 ENCOUNTER — Ambulatory Visit (HOSPITAL_BASED_OUTPATIENT_CLINIC_OR_DEPARTMENT_OTHER): Payer: No Typology Code available for payment source | Admitting: Registered"

## 2015-03-05 ENCOUNTER — Ambulatory Visit: Payer: No Typology Code available for payment source

## 2015-03-05 ENCOUNTER — Ambulatory Visit (HOSPITAL_BASED_OUTPATIENT_CLINIC_OR_DEPARTMENT_OTHER): Payer: No Typology Code available for payment source

## 2015-03-05 VITALS — BP 120/84 | HR 80 | Wt 320.2 lb

## 2015-03-05 DIAGNOSIS — I480 Paroxysmal atrial fibrillation: Secondary | ICD-10-CM | POA: Insufficient documentation

## 2015-03-05 DIAGNOSIS — I482 Chronic atrial fibrillation, unspecified: Secondary | ICD-10-CM

## 2015-03-05 LAB — PR PROTHROMBIN TIME, ONSITE: prothrombin INR: 2.1

## 2015-03-05 MED ORDER — SOTALOL HCL 80 MG OR TABS
80.0000 mg | ORAL_TABLET | Freq: Two times a day (BID) | ORAL | Status: DC
Start: 2015-03-05 — End: 2016-03-05

## 2015-03-05 MED ORDER — WARFARIN SODIUM 5 MG OR TABS
ORAL_TABLET | ORAL | Status: DC
Start: 2015-03-05 — End: 2015-06-12

## 2015-03-05 NOTE — Progress Notes (Signed)
Continue 7.5 mg X 2 days a week (T,Th) and 5mg X 5 days and re check INR in 4 weeks.      Reviewed per MD protocol.    Courtney S Lee, RN

## 2015-03-05 NOTE — Progress Notes (Signed)
Cardiovascular Clinical Evaluation Note    Primary Care Provider: Corey Harold, MD    Referring Provider: No ref. provider found    DOB:  1950/12/29    CHIEF COMPLAINT: Follow up of atrial fibrillation    HISTORY OF PRESENT ILLNESS: She continues to gain weight mostly due to not following her diet.  She's now gained 20 pounds in 6 months.  She's remained in sinus rhythm.  She remains on sotalol and Coumadin.    MEDICATIONS:    Current Outpatient Prescriptions   Medication Sig Dispense Refill   . Ascorbic Acid (VITAMIN C OR) 500 mg     . ASPIRIN OR 81 mg     . CALCIUM OR 600 mg     . Cefdinir 300 MG Oral Cap Take 1 capsule (300 mg) by mouth every 12 hours. 14 capsule 0   . Clotrimazole-Betamethasone 1-0.05 % External Cream APPLY TO AFFECTED AREA(S) TWO TIMES DAILY 30 g 0   . Flaxseed, Linseed, (FLAXSEED OIL) 1200 MG Oral Cap None Entered     . Fluticasone Propionate 50 MCG/ACT Nasal Suspension Spray 2 sprays into each nostril daily. 1 Inhaler 3   . Fluticasone Propionate HFA (FLOVENT HFA) 110 MCG/ACT Inhalation Aerosol Inhale 2 puffs by mouth 2 times a day. - RINSE MOUTH AFTER USE 1 Inhaler 0   . GLUCOSAMINE CHONDROITIN COMPLX OR      . Lansoprazole 30 MG Oral CAPSULE DELAYED RELEASE Take 1 capsule (30 mg) by mouth daily. Please schedule follow up appt with your PCP 90 capsule 0   . Magnesium 400 MG Oral Cap Take 400 mg by mouth.     . Montelukast Sodium 10 MG Oral Tab Take 1 tablet (10 mg) by mouth daily. Please make appointment 34 tablet 0   . Multiple Vitamins-Minerals (MULTIVITAMIN OR) no iron     . Probiotic Product (PROBIOTIC DAILY OR)      . Sotalol HCl 80 MG Oral Tab Take 1 tablet (80 mg) by mouth 2 times a day. 180 tablet 3   . Triamterene-HCTZ 37.5-25 MG Oral Tab Take 1 tablet by mouth daily. Please make appointment 34 tablet 0   . VENTOLIN HFA 108 (90 BASE) MCG/ACT Inhalation Aero Soln INHALE 1-2 PUFFS FOUR TIMES DAILY AS NEEDED FOR SHORTNESS OF BREATH **MUST CALL MD FOR APPOINTMENT** 18 g 0   .  Warfarin Sodium (COUMADIN) 5 MG Oral Tab Take 7.5mg  X 2 days a week (T,Th) and 5mg  X 5 days a week or as instructed by your physician. 110 tablet 0     No current facility-administered medications for this visit.       ALLERGIES:  Bee venom; Cats; Ciprofloxacin; Dust mite extract; Pollen extract; and Sulfa antibiotics      PROBLEM LIST:  Patient Active Problem List    Diagnosis Date Noted   . Chronic atrial fibrillation ([place]) [I48.2] 04/17/2014   . GERD (gastroesophageal reflux disease) [K21.9] 11/08/2012   . Palpitations [R00.2] 08/21/2012   . OA (osteoarthritis) [M19.90] 07/28/2012   . Asthma [J45.909] 07/24/2012   . Hypercholesterolemia [E78.00] 07/24/2012   . Hx of colonic polyps--Dr Read--03/21/10  cleared for 5 years [Z86.010] 07/24/2012     Colonoscopy 01/2005     . Edema [R60.9] 07/24/2012   . Sleep apnea [G47.30] 07/24/2012   . HTN (hypertension) [I10] 07/24/2012   . Menopausal state [N95.1] 07/24/2012   . Rash [R21] 07/24/2012   . Pulmonary nodule [R91.1] 07/24/2012   . Grief reaction [F43.20] 07/24/2012  05/11/2011     . Strain of thoracic spine [S29.019A] 07/24/2012       REVIEW OF SYSTEMS   CONSTITUTIONAL:  No fever, weight loss.  EYES:  No visual loss.  ENT:  No hearing loss, exudate.  CARDIOVASCULAR:  See HPI.  GI:  No melena, hematochezia, hematemesis.  SKIN:  No lesions.  GU:  No dysuria, hematuria.  MUSCULOSKELETAL:  No myalgias, no arthralgias.  NEURO:  No TIA or CVA symptoms.  ALLERGIC/IMMUNOLOGIC:  No hives or rash.  PSYCHIATRIC:  No suicidal ideations.    PHYSICAL EXAM    CONSTITUTIONAL: This is a well-nourished female in no distress.    BP 120/84 mmHg  Pulse 80  Wt 320 lb 3.2 oz (145.242 kg)  SpO2 94%  NEURO/PSYCH:  Oriented x 3 with normal affect.  EYES:  Conjunctivae pink, sclerae clear.  E/N/T:  Oral mucosa pink, dentition normal.  NECK:  Jugular venous pressure normal.  Thyroid is not enlarged.  RESPIRATORY:  Respiratory effort is normal.  Lungs are clear to percussion/auscultation.   CARDIOVASCULAR:  PMI at 5th ICS/MCL.  Regular rhythm.  S1, S2 normal.  No S3, S4, no murmur.  Carotid upstrokes are brisk, without bruits.  Abdominal aorta normal to palpation, no bruit.  Femoral pulses normal, no bruits.  Pedal pulses normal bilaterally.  Extremities mild edema.  GASTROINTESTINAL:  Abdomen without masses or tenderness.  No hepatosplenomegaly.  EXTREMITIES:  No clubbing or cyanosis.  MUSCULOSKELETAL:  Normal gait and muscle strength.  SKIN:  Warm and dry, no lesions.    IMPRESSION: Continues to struggle with weight.  Fortunately remains in sinus rhythm.  She is going to try to watch her weight better and I will see her again in 6 months for follow-up.        [name], MD [facility]  03/05/2015  11:38 AM    Electronically transcribed by voice recognition technology.  Although every effort was made to ensure accuracy, some transcription errors may occur.    CC:

## 2015-03-05 NOTE — Progress Notes (Signed)
Chart notes, labs, pharmacy, outside records and medications were obtained.

## 2015-03-11 ENCOUNTER — Other Ambulatory Visit: Payer: Self-pay

## 2015-03-11 DIAGNOSIS — J4520 Mild intermittent asthma, uncomplicated: Secondary | ICD-10-CM

## 2015-03-11 DIAGNOSIS — R600 Localized edema: Secondary | ICD-10-CM

## 2015-03-11 NOTE — Telephone Encounter (Signed)
The patient last received this medication at the requesting pharmacy on 01/28/15

## 2015-03-11 NOTE — Telephone Encounter (Signed)
The patient last received this medication at the requesting pharmacy on 03/09/15.

## 2015-03-12 MED ORDER — TRIAMTERENE-HCTZ 37.5-25 MG OR TABS
1.0000 | ORAL_TABLET | Freq: Every day | ORAL | Status: DC
Start: 2015-03-12 — End: 2015-04-14

## 2015-03-12 MED ORDER — MONTELUKAST SODIUM 10 MG OR TABS
10.0000 mg | ORAL_TABLET | Freq: Every day | ORAL | Status: DC
Start: 2015-03-12 — End: 2015-08-28

## 2015-03-12 NOTE — Telephone Encounter (Signed)
Refill Request    Last visit with PCP: 05/21/14  Next visit: Not scheduled  Labs: 03/05/15    Last refill: 01/31/15  Letter Sent: NO  Diagnosis: Asthma

## 2015-03-12 NOTE — Telephone Encounter (Signed)
Last addressed Asthma on 10/03/13  Last refilled 1 time on 01/31/15 for #34 tab +0RF     No future appt scheduled  OK to continue on Montelukast?

## 2015-04-02 ENCOUNTER — Ambulatory Visit: Payer: No Typology Code available for payment source

## 2015-04-02 ENCOUNTER — Ambulatory Visit (HOSPITAL_BASED_OUTPATIENT_CLINIC_OR_DEPARTMENT_OTHER): Payer: No Typology Code available for payment source | Admitting: Registered"

## 2015-04-02 DIAGNOSIS — I482 Chronic atrial fibrillation, unspecified: Secondary | ICD-10-CM

## 2015-04-02 LAB — PR PROTHROMBIN TIME, ONSITE: prothrombin INR: 2.3

## 2015-04-02 NOTE — Progress Notes (Signed)
Continue 7.5 mg X 2 days a week (T,Th) and 5mg X 5 days and re check INR in 4 weeks.

## 2015-04-14 ENCOUNTER — Other Ambulatory Visit: Payer: Self-pay

## 2015-04-14 DIAGNOSIS — R600 Localized edema: Secondary | ICD-10-CM

## 2015-04-14 NOTE — Telephone Encounter (Signed)
The patient last received this medication at the requesting pharmacy on 03/12/15

## 2015-04-15 MED ORDER — TRIAMTERENE-HCTZ 37.5-25 MG OR TABS
1.0000 | ORAL_TABLET | Freq: Every day | ORAL | Status: DC
Start: 2015-04-15 — End: 2015-06-10

## 2015-04-16 ENCOUNTER — Other Ambulatory Visit: Payer: Self-pay

## 2015-04-30 ENCOUNTER — Ambulatory Visit: Payer: No Typology Code available for payment source

## 2015-04-30 ENCOUNTER — Ambulatory Visit (INDEPENDENT_AMBULATORY_CARE_PROVIDER_SITE_OTHER): Payer: No Typology Code available for payment source

## 2015-04-30 ENCOUNTER — Encounter (INDEPENDENT_AMBULATORY_CARE_PROVIDER_SITE_OTHER): Payer: Self-pay

## 2015-04-30 VITALS — BP 120/90 | HR 60 | Ht 66.5 in | Wt 321.0 lb

## 2015-04-30 DIAGNOSIS — R350 Frequency of micturition: Secondary | ICD-10-CM | POA: Insufficient documentation

## 2015-04-30 DIAGNOSIS — I10 Essential (primary) hypertension: Secondary | ICD-10-CM | POA: Insufficient documentation

## 2015-04-30 DIAGNOSIS — Z86010 Personal history of colonic polyps: Secondary | ICD-10-CM

## 2015-04-30 DIAGNOSIS — I482 Chronic atrial fibrillation, unspecified: Secondary | ICD-10-CM

## 2015-04-30 DIAGNOSIS — M8949 Other hypertrophic osteoarthropathy, multiple sites: Secondary | ICD-10-CM

## 2015-04-30 DIAGNOSIS — G4733 Obstructive sleep apnea (adult) (pediatric): Secondary | ICD-10-CM

## 2015-04-30 DIAGNOSIS — R5383 Other fatigue: Secondary | ICD-10-CM

## 2015-04-30 DIAGNOSIS — S39012A Strain of muscle, fascia and tendon of lower back, initial encounter: Secondary | ICD-10-CM

## 2015-04-30 DIAGNOSIS — M79672 Pain in left foot: Secondary | ICD-10-CM

## 2015-04-30 DIAGNOSIS — S29019A Strain of muscle and tendon of unspecified wall of thorax, initial encounter: Secondary | ICD-10-CM

## 2015-04-30 DIAGNOSIS — K219 Gastro-esophageal reflux disease without esophagitis: Secondary | ICD-10-CM

## 2015-04-30 DIAGNOSIS — M159 Polyosteoarthritis, unspecified: Secondary | ICD-10-CM

## 2015-04-30 DIAGNOSIS — J4520 Mild intermittent asthma, uncomplicated: Secondary | ICD-10-CM

## 2015-04-30 DIAGNOSIS — E7800 Pure hypercholesterolemia, unspecified: Secondary | ICD-10-CM | POA: Insufficient documentation

## 2015-04-30 DIAGNOSIS — M150 Primary generalized (osteo)arthritis: Secondary | ICD-10-CM

## 2015-04-30 DIAGNOSIS — Z1239 Encounter for other screening for malignant neoplasm of breast: Secondary | ICD-10-CM

## 2015-04-30 DIAGNOSIS — Z780 Asymptomatic menopausal state: Secondary | ICD-10-CM

## 2015-04-30 LAB — COMPREHENSIVE METABOLIC PANEL
ALT (GPT): 16 U/L (ref 7–33)
AST (GOT): 19 U/L (ref 9–38)
Albumin: 4 g/dL (ref 3.5–5.2)
Alkaline Phosphatase (Total): 77 U/L (ref 31–132)
Anion Gap: 10 (ref 4–12)
Bilirubin (Total): 0.8 mg/dL (ref 0.2–1.3)
Calcium: 9.4 mg/dL (ref 8.9–10.2)
Carbon Dioxide, Total: 25 meq/L (ref 22–32)
Chloride: 104 meq/L (ref 98–108)
Creatinine: 0.77 mg/dL (ref 0.38–1.02)
GFR, Calc, African American: >60 mL/min/{1.73_m2}
GFR, Calc, European American: >60 mL/min/{1.73_m2}
Glucose: 100 mg/dL (ref 62–125)
Potassium: 4.1 meq/L (ref 3.6–5.2)
Protein (Total): 6.3 g/dL (ref 6.0–8.2)
Sodium: 139 meq/L (ref 135–145)
Urea Nitrogen: 16 mg/dL (ref 8–21)

## 2015-04-30 LAB — CBC, DIFF
% Basophils: 1 %
% Eosinophils: 3 %
% Immature Granulocytes: 1 %
% Lymphocytes: 25 %
% Monocytes: 5 %
% Neutrophils: 65 %
% Nucleated RBC: 0 %
Absolute Eosinophil Count: 0.32 10*3/uL (ref 0.00–0.50)
Absolute Lymphocyte Count: 2.51 10*3/uL (ref 1.00–4.80)
Basophils: 0.07 10*3/uL (ref 0.00–0.20)
Hematocrit: 45 % (ref 36–45)
Hemoglobin: 14.6 g/dL (ref 11.5–15.5)
Immature Granulocytes: 0.06 10*3/uL — ABNORMAL HIGH (ref 0.00–0.05)
MCH: 27.5 pg (ref 27.3–33.6)
MCHC: 32.5 g/dL (ref 32.2–36.5)
MCV: 85 fL (ref 81–98)
Monocytes: 0.55 10*3/uL (ref 0.00–0.80)
Neutrophils: 6.66 10*3/uL (ref 1.80–7.00)
Nucleated RBC: 0 10*3/uL
Platelet Count: 311 10*3/uL (ref 150–400)
RBC: 5.3 10*6/uL — ABNORMAL HIGH (ref 3.80–5.00)
RDW-CV: 14.4 % (ref 11.6–14.4)
WBC: 10.17 10*3/uL — ABNORMAL HIGH (ref 4.3–10.0)

## 2015-04-30 LAB — URINALYSIS MICROSCOPIC
Epith Cells_Renal/Trans,URN: NEGATIVE /HPF
Epith Cells_Squamous, URN: NEGATIVE /LPF
RBC, URN: NEGATIVE /HPF
WBC, URN: NEGATIVE /HPF

## 2015-04-30 LAB — LIPID PANEL
Cholesterol (LDL): 110 mg/dL (ref <130)
Cholesterol/HDL Ratio: 3
HDL Cholesterol: 66 mg/dL (ref >39)
Non-HDL Cholesterol: 134 mg/dL (ref 0–159)
Total Cholesterol: 200 mg/dL — ABNORMAL HIGH (ref <200)
Triglyceride: 121 mg/dL (ref <150)

## 2015-04-30 LAB — THYROID STIMULATING HORMONE: Thyroid Stimulating Hormone: 2.432 u[IU]/mL (ref 0.400–5.000)

## 2015-04-30 LAB — URINALYSIS MACROSCOPIC, POC
Bilirubin (Qual), URN: NEGATIVE
Glucose (Qual), URN: NEGATIVE mg/dL
Ketones, URN: NEGATIVE mg/dL
Leukocyte Esterase, URN: POSITIVE — AB
Nitrites POC, URN: NEGATIVE
Occult Blood, URN: NEGATIVE
Protein (Alb Semiquant), URN: NEGATIVE
Specific Gravity, URN: 1.02 (ref 1.005–1.030)
pH, URN: 7.5 (ref 5–8)

## 2015-04-30 NOTE — Progress Notes (Signed)
I, Ricki Miller, MD interviewed and examined the patient while overseeing the documentation performed by my Medical Scribe, Dorris Fetch. I have reviewed and revised as necessary the scribe's note and agree with the documented findings and plan of care. Please refer to the scribe's note below for further detailed information regarding the patient encounter and exam.  04/30/2015. 1:21 PM.

## 2015-04-30 NOTE — Patient Instructions (Signed)
I recommend that you obtain the shingles vaccine (Zostavax) at a local pharmacy as we do not carry it here in the clinic.     Seek immediate attention if symptoms are worsening. Call if symptoms are not resolving with therapy.    A letter will be coming within the next two weeks reviewing the lab results.    Best wishes,    Dr. Hagedorn

## 2015-04-30 NOTE — Progress Notes (Signed)
DATE: 04/30/2015     PHYSICIAN NOTE:  Patient Name: Caitlin Wiley  Medical Record Number: [id number]  Visit Date/time: 04/30/2015     Date of Birth: 04/08/1950    CHIEF COMPLAINT:    Caitlin Wiley is a 65 year old female who is here today for review of multiple medical problems.    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    Caitlin Wiley is a 65 year old female who is here today for review of multiple medical problems as reported above. Patient reports a sudden onset of left fifth toe pain. She can not recall any trauma that would cause the pain. She currently walks with a cane due to right knee pain. She also reports left low back pain after reaching for an object that was out of her reach. If she is lying on her right side, her left lower back will begin to spasm. Denies radiation into legs.    Patient's husband passed away about four years ago which has been very difficult for her. She is currently living in the same house that they bought together in their 41s.     Patient is due for mammogram, zostvax, and DEXA. Her pap smears are monitored by Dr. Harmon Pier.     PROBLEM LIST:  Patient Active Problem List   Diagnosis   . Asthma   . Hypercholesterolemia   . Hx of colonic polyps--Dr  Read--03/21/10  cleared for 5 years   . Edema   . Sleep apnea   . HTN (hypertension)   . Menopausal state   . Rash   . Pulmonary nodule   . Grief reaction   . Strain of thoracic spine   . OA (osteoarthritis)   . GERD (gastroesophageal reflux disease)   . Palpitations   . Chronic atrial fibrillation ([place])       SURGICAL HISTORY:  Past Surgical History   Procedure Laterality Date   . Colonoscopy flx dx w/collj spec when pfrmd  11/2005   . Surgical hx other  08/1999     knee surgery       MEDICATIONS:  Current Outpatient Prescriptions   Medication Sig Dispense Refill   . Ascorbic Acid (VITAMIN C OR) 500 mg     . ASPIRIN OR 81 mg     . CALCIUM OR 600 mg     . Cefdinir 300 MG Oral Cap Take 1 capsule (300 mg) by mouth every 12 hours. 14 capsule 0   . Clotrimazole-Betamethasone 1-0.05 % External Cream APPLY TO AFFECTED AREA(S) TWO TIMES DAILY 30 g 0   . Flaxseed, Linseed, (FLAXSEED OIL) 1200 MG Oral Cap None Entered     . Fluticasone Propionate 50 MCG/ACT Nasal Suspension Spray 2 sprays into each nostril daily. 1 Inhaler 3   . Fluticasone Propionate HFA (FLOVENT HFA) 110 MCG/ACT Inhalation Aerosol Inhale 2 puffs by mouth  2 times a day. - RINSE MOUTH AFTER USE 1 Inhaler 0   . GLUCOSAMINE CHONDROITIN COMPLX OR      . Lansoprazole 30 MG Oral CAPSULE DELAYED RELEASE Take 1 capsule (30 mg) by mouth daily. Please schedule follow up appt with your PCP 90 capsule 0   . Magnesium 400 MG Oral Cap Take 400 mg by mouth.     . Montelukast Sodium 10 MG Oral Tab Take 1 tablet (10 mg) by mouth daily. 34 tablet 5   . Multiple Vitamins-Minerals (MULTIVITAMIN OR) no iron     . Probiotic Product (PROBIOTIC DAILY OR)      . Sotalol HCl 80 MG Oral Tab Take 1 tablet (80 mg) by mouth 2 times a day. 180 tablet 3   . Triamterene-HCTZ 37.5-25 MG Oral Tab Take 1 tablet by mouth daily. 34 tablet 0   . VENTOLIN HFA 108 (90 BASE) MCG/ACT Inhalation Aero Soln INHALE 1-2 PUFFS FOUR TIMES DAILY AS NEEDED FOR SHORTNESS OF BREATH **MUST CALL MD FOR  APPOINTMENT** 18 g 0   . Warfarin Sodium (COUMADIN) 5 MG Oral Tab Take 7.5mg  X 2 days a week (T,Th) and 5mg  X 5 days a week or as instructed by your physician. 110 tablet 0     No current facility-administered medications for this visit.       Georgina Snell MD, have reviewed the medication list in its entirety and agree with all contents.     ALLERGIES:  Review of patient's allergies indicates:  Allergies   Allergen Reactions   . Bee Venom    . Cats [Animals]    . Ciprofloxacin      Joint aching   . Dust Mite Extract      DUST   . Pollen Extract      POLLEN   . Sulfa Antibiotics        FAMILY HISTORY:  Family History   Problem Relation Age of Onset   . Arthritis Mother    . Breast Cancer Mother      & tongue cancer   . Mental Illness Mother    . Stroke Mother      possible   . Hypertension Mother    . Anesthesia Problems Mother    . Arthritis Father    . Cancer Father    . Diabetes Father    . Colon Polyps Father    . Heart Disease Father      torn valve 01/1999   . Hypertension Father    . Stroke Father    . Asthma       and/or allergies - grandfather   . Cancer       grandparents   . Heart Disease Maternal Grandfather    . Heart Disease Paternal Grandfather    . Tuberculosis       great-grandmother   . Arthritis       grandmother   . Breast Cancer       grandmother   . Mental Illness Brother    . Lupus Aunt/Uncle      great-aunt       SOCIAL HISTORY:  Social History     Social History   . Marital Status: N/A     Spouse Name: N/A   . Number of Children: 0   . Years of Education: N/A     Occupational History   . Not on file.     Social History Main Topics   . Smoking status:  Never Smoker    . Smokeless tobacco: Never Used   . Alcohol Use: 0.0 oz/week     0 Standard drinks or equivalent per week   . Drug Use: No   . Sexual Activity: No     REVIEW OF SYSTEMS:    female    CONSTITUTIONAL: H/o sleep apnea  NEUROLOGIC: Denies, headaches and tremors  OPHTHALMIC: Denies, blurry vision, diplopia, eye pain  ENT: Denies,  hearing difficulties, nasal congestion and any ear nose or throat problems  RESPIRATORY: H/o asthma, right pulmonary nodule   CARDIOVASCULAR: H/o HTN, atrial fibrillation, palpitations   GI: H/o GERD  BREAST: Denies, breast pain, breast nipple discharge, history of abnormal mammogram, history of breast mass  GENITOURINARY: Postmenopausal   INTEGUMENTARY: Denies, skin rash, easy bruising, hair loss, changes in moles or new moles  RHEUMATOLOGIC/MSK: H/o osteoarthritis; Reports left fifth toe pain   ENDOCRINE: H/o hypercholesterolemia   HEME/LYMPH:  Denies, Easy bruising, Lumps under arms or around neck and Anemia  PSYCHOSOCIAL: Denies and any psychological problems      PHYSICAL EXAM:  BP 120/90 mmHg  Pulse 60  Ht 5' 6.5" (1.689 m)  Wt 321 lb (145.605 kg)  BMI 51.04 kg/m2  Physical Examination-female: Vital Signs as noted above.    General Appearance and Mental Status: Well Developed/Well Nourished  [ ]  Slender  [ ]  Average  [ ]  Heavy [x]  Obese  [x ] Appropriate Affect, Oriented to Person, Place, Time    Skin: Warm, dry to touch    Head: Normocephalic, atraumatic    Eyes: EOMI PERLA    Pharynx: Clear    Neck: Supple, without JVD  or goiter    Chest: Clear     CV: Heart: RRR, no murmur, no rub, no S3, S4 noted    Abd:  Soft.  BS present.  No masses, or tenderness noted.      Extrem: no edema    MSK: Tenderness over left fifth MTP joint on left foot, tenderness over lower thoracic and upper lumbar left paraspinous musculature     Neuro: intact         LAB RESULTS:  Pending.    ASSESSMENT AND PLAN:  Pertinent Labs & Imaging studies reviewed. (See chart for details)  Prior EMR records reviewed in EPIC as available and clinically relevant.    1. Chronic atrial fibrillation ([place])  She is currently anticoagulated with Warfarin.     2. Hypercholesterolemia  Patient has a h/o hypercholesterolemia. I ordered a fasting lipids and CMP for further evaluation.   - LIPID PANEL    3. Essential hypertension  Patient's blood  pressure today is 120/90. I ordered a CBC and CMP for further evaluation.   - CBC, DIFF  - COMPREHENSIVE METABOLIC PANEL    4. Gastroesophageal reflux disease without esophagitis  Controlled.     5. Obstructive sleep apnea syndrome  She is currently using a CPAP machine which provides her with relief.     6. Hx of colonic polyps--Dr Read--03/21/10  cleared for 5 years  Controlled.     7. Primary osteoarthritis involving multiple joints  Controlled.     8. Mild intermittent asthma without complication  Controlled.     9. Left foot pain at the 5th MTP joint  On examination, she has tenderness over left fifth MTP joint on left foot. I referred her to podiatry for further assessment.   - REFERRAL TO PODIATRY    10. Thoracic myofascial strain, initial encounter  Patient  reports left sided low back pain which onset after she was reaching for an object out of her reach. On examination, she has tenderness over the lower thoracic and upper lumbar left paraspinous musculature. I ordered a thoracolumbar xray for further evaluation. She would like to see if this resolves with time before we proceed.   - XR T-L SPINE 4 VW TOTAL 2 VW EACH    11. Lumbar strain, initial encounter  See #10.   - XR T-L SPINE 4 VW TOTAL 2 VW EACH    12. Postmenopausal  I ordered a DEXA scan for further evaluation.   - DEXA, AXIAL SKELETON    13. Urinary frequency  I ordered a UA for further evaluation.   - Urinalysis Macroscopic, POC    14. Other fatigue  I ordered a TSH for further evaluation.   - THYROID STIMULATING HORMONE    15. Screening for breast cancer  Patient is due for screening mammogram. I ordered one today to be done at [facility]          Further treatment dependant on the results of the tests.    Seek immediate attention if symptoms are worsening.    Call if symptoms are not resolving with therapy.      HEALTH MAINTENANCE:     Social History Narrative    [employer]  Technical sales engineer  Rare ETOH  Former  Smoker No social drug use. Rare exercise.         DXA completed on 07/31/12, resulting in normal bone density of the lumbar spine, and normal bone density at the L hip; ordered 04/30/15        Mammogram performed on 05/27/14, resulting in no mammographic features of malignancy         Colonoscopy performed on 04/03/14 with Dr. Estill Bamberg, resulting in moderate diverticulosis of the left side of the colon, long redundant colon, cleared for seven years         TDAP- 03/13/10    Zostavax-advised 04/30/15        PAP-monitored by Dr. Harmon Pier        HIV screening, Hepatitis C screening-done in 2014       Portions of this chart were written by Dorris Fetch, Medical Scribe with oversight by Ricki Miller, MD 04/30/2015 10:33 AM    I,  Ricki Miller, have reviewed the information recorded by the scribe for accuracy and agree with its contents.    This medical record has been electronically generated with voice recognition software. Every attempt is made to assure accuracy.   However. it may contain errors related to this process.

## 2015-05-01 ENCOUNTER — Encounter (INDEPENDENT_AMBULATORY_CARE_PROVIDER_SITE_OTHER): Payer: Self-pay

## 2015-05-02 LAB — URINE C/S: Colony Count: 5000

## 2015-05-03 ENCOUNTER — Other Ambulatory Visit (INDEPENDENT_AMBULATORY_CARE_PROVIDER_SITE_OTHER): Payer: Self-pay | Admitting: Internal Medicine

## 2015-05-03 DIAGNOSIS — R0602 Shortness of breath: Secondary | ICD-10-CM

## 2015-05-05 ENCOUNTER — Telehealth (INDEPENDENT_AMBULATORY_CARE_PROVIDER_SITE_OTHER): Payer: Self-pay

## 2015-05-05 ENCOUNTER — Other Ambulatory Visit: Payer: Self-pay

## 2015-05-05 DIAGNOSIS — J4520 Mild intermittent asthma, uncomplicated: Secondary | ICD-10-CM

## 2015-05-05 DIAGNOSIS — R350 Frequency of micturition: Secondary | ICD-10-CM

## 2015-05-05 DIAGNOSIS — K219 Gastro-esophageal reflux disease without esophagitis: Secondary | ICD-10-CM

## 2015-05-05 DIAGNOSIS — D72829 Elevated white blood cell count, unspecified: Secondary | ICD-10-CM

## 2015-05-05 MED ORDER — ALBUTEROL SULFATE HFA 108 (90 BASE) MCG/ACT IN AERS
1.0000 | INHALATION_SPRAY | Freq: Four times a day (QID) | RESPIRATORY_TRACT | Status: DC | PRN
Start: 2015-05-05 — End: 2015-11-19

## 2015-05-05 NOTE — Telephone Encounter (Addendum)
The patient last received Flovent at the requesting pharmacy on 02/05/15    The patient last received Lansoprazole at the requesting pharmacy on 04/01/15

## 2015-05-05 NOTE — Telephone Encounter (Signed)
Wylee Rivere, [phone], ok to leave detailed msg. Has Letter to speak with Dayna.      Unfortunately I didn't ask her which letter. There are 3 recent letters.

## 2015-05-06 ENCOUNTER — Other Ambulatory Visit (INDEPENDENT_AMBULATORY_CARE_PROVIDER_SITE_OTHER): Payer: No Typology Code available for payment source

## 2015-05-06 ENCOUNTER — Other Ambulatory Visit (INDEPENDENT_AMBULATORY_CARE_PROVIDER_SITE_OTHER): Payer: Self-pay

## 2015-05-06 ENCOUNTER — Encounter (INDEPENDENT_AMBULATORY_CARE_PROVIDER_SITE_OTHER): Payer: Self-pay

## 2015-05-06 ENCOUNTER — Ambulatory Visit: Payer: No Typology Code available for payment source

## 2015-05-06 DIAGNOSIS — R350 Frequency of micturition: Secondary | ICD-10-CM | POA: Insufficient documentation

## 2015-05-06 DIAGNOSIS — D72829 Elevated white blood cell count, unspecified: Secondary | ICD-10-CM | POA: Insufficient documentation

## 2015-05-06 DIAGNOSIS — I482 Chronic atrial fibrillation, unspecified: Secondary | ICD-10-CM

## 2015-05-06 LAB — URINALYSIS MACROSCOPIC, POC
Bilirubin (Qual), URN: NEGATIVE
Glucose (Qual), URN: NEGATIVE mg/dL
Ketones, URN: NEGATIVE mg/dL
Leukocyte Esterase, URN: POSITIVE — AB
Nitrites POC, URN: NEGATIVE
Occult Blood, URN: NEGATIVE
Protein (Alb Semiquant), URN: NEGATIVE
Specific Gravity, URN: 1.015 (ref 1.005–1.030)
pH, URN: 6.5 (ref 5–8)

## 2015-05-06 LAB — URINALYSIS MICROSCOPIC
Epith Cells_Renal/Trans,URN: NEGATIVE /HPF
RBC, URN: NEGATIVE /HPF
WBC, URN: NEGATIVE /HPF

## 2015-05-06 LAB — CBC, DIFF
% Basophils: 1 %
% Eosinophils: 3 %
% Immature Granulocytes: 1 %
% Lymphocytes: 21 %
% Monocytes: 5 %
% Neutrophils: 69 %
% Nucleated RBC: 0 %
Absolute Eosinophil Count: 0.35 10*3/uL (ref 0.00–0.50)
Absolute Lymphocyte Count: 2.44 10*3/uL (ref 1.00–4.80)
Basophils: 0.09 10*3/uL (ref 0.00–0.20)
Hematocrit: 46 % — ABNORMAL HIGH (ref 36–45)
Hemoglobin: 15 g/dL (ref 11.5–15.5)
Immature Granulocytes: 0.06 10*3/uL — ABNORMAL HIGH (ref 0.00–0.05)
MCH: 27.3 pg (ref 27.3–33.6)
MCHC: 32.3 g/dL (ref 32.2–36.5)
MCV: 84 fL (ref 81–98)
Monocytes: 0.61 10*3/uL (ref 0.00–0.80)
Neutrophils: 8.31 10*3/uL — ABNORMAL HIGH (ref 1.80–7.00)
Nucleated RBC: 0 10*3/uL
Platelet Count: 359 10*3/uL (ref 150–400)
RBC: 5.5 10*6/uL — ABNORMAL HIGH (ref 3.80–5.00)
RDW-CV: 14 % (ref 11.6–14.4)
WBC: 11.86 10*3/uL — ABNORMAL HIGH (ref 4.3–10.0)

## 2015-05-06 LAB — PR PROTHROMBIN TIME, ONSITE: prothrombin INR: 2.3

## 2015-05-06 MED ORDER — FLUTICASONE PROPIONATE HFA 110 MCG/ACT IN AERO
2.0000 | INHALATION_SPRAY | Freq: Two times a day (BID) | RESPIRATORY_TRACT | Status: DC
Start: 2015-05-06 — End: 2016-03-14

## 2015-05-06 MED ORDER — LANSOPRAZOLE 30 MG OR CPDR
30.0000 mg | DELAYED_RELEASE_CAPSULE | Freq: Every day | ORAL | Status: DC
Start: 2015-05-06 — End: 2015-11-03

## 2015-05-06 NOTE — Telephone Encounter (Signed)
Please sign for lab tests.

## 2015-05-06 NOTE — Progress Notes (Signed)
Continue 7.5 mg X 2 days a week (T,Th) and 5mg X 5 days and re check INR in 4 weeks.

## 2015-05-07 LAB — URINE C/S: Colony Count: 8000

## 2015-05-30 ENCOUNTER — Ambulatory Visit (INDEPENDENT_AMBULATORY_CARE_PROVIDER_SITE_OTHER): Payer: No Typology Code available for payment source | Admitting: Internal Medicine

## 2015-05-30 ENCOUNTER — Encounter (INDEPENDENT_AMBULATORY_CARE_PROVIDER_SITE_OTHER): Payer: No Typology Code available for payment source

## 2015-05-30 VITALS — BP 128/82 | HR 65 | Ht 66.5 in | Wt 321.0 lb

## 2015-05-30 DIAGNOSIS — M5412 Radiculopathy, cervical region: Secondary | ICD-10-CM | POA: Insufficient documentation

## 2015-05-30 DIAGNOSIS — E669 Obesity, unspecified: Secondary | ICD-10-CM

## 2015-05-30 DIAGNOSIS — I482 Chronic atrial fibrillation, unspecified: Secondary | ICD-10-CM

## 2015-05-30 MED ORDER — METHYLPREDNISOLONE 4 MG OR TBPK
ORAL_TABLET | ORAL | Status: DC
Start: 2015-05-30 — End: 2015-12-01

## 2015-05-30 NOTE — Progress Notes (Signed)
Jonna Coup, MD  Internal Medicine    PROGRESS NOTE         RE: Caitlin Wiley  MRN:  [id number]  DOB:  [birth year]      Date:  05/30/2015       I am seeing this patient with the following problems and followed by Dr Elizebeth Brooking     PROBLEMS:  1. Right shoulder area pain. C/o pain in her right shoulder area for the last week from above her scapula and down to her right shoulder and upper arm and sometimes in her right small finger. Previous Cervical Laminectomy by Dr Ernesto Rutherford in 08/2009.No numbness, tingling or weakness and worse if she reaches. No meds for this. Denies any injury.    ALLERGIES - Review of patient's allergies indicates:  Allergies   Allergen Reactions   . Bee Venom    . Cats [Animals]    . Ciprofloxacin      Joint aching   . Dust Mite Extract      DUST   . Pollen Extract      POLLEN   . Sulfa Antibiotics         MEDICATIONS -   Current Outpatient Prescriptions   Medication Sig Dispense Refill   . Albuterol Sulfate HFA (VENTOLIN HFA) 108 (90 Base) MCG/ACT Inhalation Aero Soln Inhale 1-2 puffs by mouth 4 times a day as needed for shortness of breath/wheezing. 18 g 2   . Ascorbic Acid (VITAMIN C OR) 500 mg     . ASPIRIN OR 81 mg     . Flaxseed, Linseed, (FLAXSEED OIL) 1200 MG Oral Cap None Entered     . Fluticasone Propionate 50 MCG/ACT Nasal Suspension Spray 2 sprays into each nostril daily. 1 Inhaler 3   . Fluticasone Propionate HFA (FLOVENT HFA) 110 MCG/ACT Inhalation Aerosol Inhale 2 puffs by mouth 2 times a day. - RINSE MOUTH AFTER USE 3 Inhaler 1   . GLUCOSAMINE CHONDROITIN COMPLX OR      . Lansoprazole 30 MG Oral CAPSULE DELAYED RELEASE Take 1 capsule (30 mg) by mouth daily. 90 capsule 1   . Magnesium 400 MG Oral Cap Take 400 mg by mouth.     . Montelukast Sodium 10 MG Oral Tab Take 1 tablet (10 mg) by mouth daily. 34 tablet 5   . Multiple Vitamins-Minerals (MULTIVITAMIN OR) no iron     . Probiotic Product (PROBIOTIC DAILY OR)      . Sotalol HCl 80 MG Oral Tab Take 1 tablet (80 mg) by mouth 2  times a day. 180 tablet 3   . Triamterene-HCTZ 37.5-25 MG Oral Tab Take 1 tablet by mouth daily. 34 tablet 0   . Warfarin Sodium (COUMADIN) 5 MG Oral Tab Take 7.5mg  X 2 days a week (T,Th) and 5mg  X 5 days a week or as instructed by your physician. 110 tablet 0     No current facility-administered medications for this visit.       FH/SH:  Reviewed in the EMR.    REVIEW OF SYSTEMS:  Constitutional: No fever, fatigue, or night sweats.   Respiratory: No cough, SOB, or wheezing.  Cardiovascular: No chest pain or palpitations.  All remaining systems have been reviewed and are negative    PHYSICAL EXAM:  BP: 128/82 mmHg  Pulse: 65  Height: 5' 6.5" (168.9 cm)  Weight: (!) 321 lb (145.605 kg)      CONSTITUTIONAL: No apparent distress. Well nourished and well developed.  HEAD / FACE: Normocephalic.  EYES: No exophthalmus, pupillary reaction is normal and EOM intact.  EARS: Hearing grossly intact.   NECK / THYROID: Supple, without adenopathy.  Decreased range of motion of neck left and right flexion and extension which reproduces her symptoms in her right arm  LYMPHATIC: No palpable cervical, supraclavicular adenopathy.   RESPIRATORY: Normal to inspection.  Lungs clear to auscultation and percussion.  CARDIOVASCULAR: Regular rhythm.  No murmurs, gallops, or rubs.  ABDOMEN: Soft, non-tender, without organomegaly or masses.  MUSCULOSKELETAL: Normal musculature. No skeletal tenderness or joint deformity.  EXTREMITIES:  Extremities appear normal.  No edema or cyanosis.  NEURO: Alert and oriented x 3.     LABORATORY DATA:  Reviewed labs from the EMR today.    XRAY REPORTS:  As noted in EMR.          IMPRESSION/PLAN:  1. Right cervical radiculopathy.  Needing further evaluation and treatment  See AVS  - REFERRAL TO PHYSICAL THERAPY  -Medrol dosepak    2. Chronic atrial fibrillation ([place]).  Stable  See above    3. Obesity, unspecified obesity severity, unspecified obesity type  See vital signs    Patient Instructions   I recommend  that you take a medrol dosepak as prescribed. I recommend that you make an apt to see a Physical Therapist as well about your condition.I recommend that you apply  ice to your neck up to 4 times daily until improved.I recommend that you make a follow up apt with Dr Elizebeth Brooking. I recommend that you call Dr Elizebeth Brooking or covering MD if problems in the interim.            Ezekiel Ina, MD    [facility] Internal Medicine  [address] [place], [place]  [phone]

## 2015-05-30 NOTE — Patient Instructions (Addendum)
I recommend that you take a medrol dosepak as prescribed. I recommend that you make an apt to see a Physical Therapist as well about your condition.I recommend that you apply  ice to your neck up to 4 times daily until improved.I recommend that you make a follow up apt with Dr Elizebeth Brooking. I recommend that you call Dr Elizebeth Brooking or covering MD if problems in the interim.

## 2015-06-05 ENCOUNTER — Ambulatory Visit: Payer: No Typology Code available for payment source

## 2015-06-05 DIAGNOSIS — I482 Chronic atrial fibrillation, unspecified: Secondary | ICD-10-CM

## 2015-06-05 LAB — PR PROTHROMBIN TIME, ONSITE: prothrombin INR: 2.4

## 2015-06-05 NOTE — Progress Notes (Signed)
Continue 7.5 mg X 2 days a week (T,Th) and 5mg X 5 days and re check INR in 4 weeks.

## 2015-06-10 ENCOUNTER — Other Ambulatory Visit: Payer: Self-pay

## 2015-06-10 DIAGNOSIS — R600 Localized edema: Secondary | ICD-10-CM

## 2015-06-10 MED ORDER — TRIAMTERENE-HCTZ 37.5-25 MG OR TABS
1.0000 | ORAL_TABLET | Freq: Every day | ORAL | 3 refills | Status: DC
Start: 2015-06-10 — End: 2016-05-23

## 2015-06-10 NOTE — Telephone Encounter (Signed)
The patient last received this medication at the requesting pharmacy on 05/27/15

## 2015-06-12 ENCOUNTER — Other Ambulatory Visit (HOSPITAL_BASED_OUTPATIENT_CLINIC_OR_DEPARTMENT_OTHER): Payer: Self-pay

## 2015-06-12 DIAGNOSIS — I480 Paroxysmal atrial fibrillation: Secondary | ICD-10-CM

## 2015-06-12 MED ORDER — WARFARIN SODIUM 5 MG OR TABS
ORAL_TABLET | ORAL | 0 refills | Status: DC
Start: 2015-06-12 — End: 2015-09-01

## 2015-06-17 ENCOUNTER — Ambulatory Visit (INDEPENDENT_AMBULATORY_CARE_PROVIDER_SITE_OTHER): Payer: No Typology Code available for payment source

## 2015-06-17 ENCOUNTER — Encounter (INDEPENDENT_AMBULATORY_CARE_PROVIDER_SITE_OTHER): Payer: Self-pay

## 2015-06-17 VITALS — BP 140/90 | HR 80 | Ht 66.5 in | Wt 324.0 lb

## 2015-06-17 DIAGNOSIS — S161XXD Strain of muscle, fascia and tendon at neck level, subsequent encounter: Secondary | ICD-10-CM

## 2015-06-17 DIAGNOSIS — M792 Neuralgia and neuritis, unspecified: Secondary | ICD-10-CM

## 2015-06-17 NOTE — Patient Instructions (Addendum)
Seek immediate attention if symptoms are worsening. Call if symptoms are not resolving with therapy.        Best wishes,    Dr. Hagedorn

## 2015-06-17 NOTE — Progress Notes (Signed)
DATE: 06/17/2015     PHYSICIAN NOTE:  Patient Name: Caitlin Wiley  Medical Record Number: [id number]  Visit Date/time: 06/17/2015     Date of Birth: Jan 02, 1951    CHIEF COMPLAINT:    Caitlin Wiley is a 65 year old female who is here today for review of right cervical pain.    PROBLEMS TO BE ADDRESSED ON TODAYS VISIT:  (S16.1XXD) Cervical strain, subsequent encounter  (primary encounter diagnosis)  (M79.2) Radicular pain in right arm    HPI:  History provided by patient.    Caitlin Wiley is a 65 year old female who is here today for review of right cervical pain. Patient was seen by Dr. Marolyn Hammock on 05/30/15 due to right cervical and arm pain. She was prescribed a Medrol dose pak and recommended that she apply ice up to four times daily. Today, she notes there is intermittent tingling in her arm and fingers. However, she notes improvement since attending physical therapy.       Past Medical History:  As cited in EHR, updated and reviewed by myself on 06/17/2015    Family History:  As cited in EHR, updated and reviewed by myself on 06/17/2015     Medications/Allergies:  As cited in EHR, updated and reviewed by myself on 06/17/2015    REVIEW OF SYSTEMS:  female    RHEUMATOLOGIC/MSK: H/o osteoarthritis; Reports right cervical and arm pain      PHYSICAL EXAM:  BP 140/90   Pulse 80   Ht 5' 6.5" (1.689 m)   Wt (!) 324 lb (147 kg)   BMI 51.51 kg/m   Physical Examination-female: Vital Signs as noted above.    General Appearance and Mental Status: Well Developed/Well Nourished  [ ]  Slender  [ ]  Average  [ ]  Heavy [x ] Obese  [x ] Appropriate Affect, Oriented to Person, Place, Time    Skin: Warm, dry to touch    Head: Normocephalic, atraumatic    Eyes: EOMI    Neck: Supple, without JVD  or goiter    Chest: Clear     CV: Heart: RRR, no murmur, no rub, no S3, S4 noted    Extrem: no edema    MSK: tenderness over right cervical paraspinous musculature     Neuro: intact       ASSESSMENT AND PLAN:  Pertinent  Labs & Imaging studies reviewed. (See chart for details)  Prior EMR records reviewed in EPIC as available and clinically relevant.    1. Cervical strain, subsequent encounter  Patient reports right cervical pain that intermittently radiates into her right shoulder and right arm. Occasionally she experiences a tingling/numbing sensation in her right fingers. On examination, she has tenderness over the right cervical paraspinous musculature. She has seen improvement with physical therapy. I referred her to neurology, Dr. Joie Bimler or associate, for further assessment and treatment.     2. Radicular pain in right arm  See #1.   - REFERRAL TO NEUROLOGY         This appointment took 15 minutes and more than 50% was spent in counseling and/or coordination of care.     Further treatment dependant on the results of the tests.    Seek immediate attention if symptoms are worsening.    Call if symptoms are not resolving with therapy.    Portions of this chart were written by Caitlin Wiley, Medical Scribe with oversight by Caitlin Miller, MD 06/17/2015 4:32 PM    I,  Caitlin Olden  Wiley, have reviewed the information recorded by the scribe for accuracy and agree with its contents.    This medical record has been electronically generated with voice recognition software. Every attempt is made to assure accuracy.   However. it may contain errors related to this process.

## 2015-06-18 ENCOUNTER — Encounter (INDEPENDENT_AMBULATORY_CARE_PROVIDER_SITE_OTHER): Payer: Self-pay

## 2015-07-03 ENCOUNTER — Ambulatory Visit: Payer: No Typology Code available for payment source

## 2015-07-03 DIAGNOSIS — I482 Chronic atrial fibrillation, unspecified: Secondary | ICD-10-CM

## 2015-07-03 LAB — PR PROTHROMBIN TIME, ONSITE: prothrombin INR: 2.6

## 2015-07-03 NOTE — Progress Notes (Signed)
Continue 7.5 mg X 2 days a week (T,Th) and 5mg X 5 days and re check INR in 4 weeks.      Reviewed per MD protocol.    Courtney S Lee, RN

## 2015-07-30 LAB — PROTHROMBIN TIME: prothrombin INR: 3

## 2015-07-31 ENCOUNTER — Encounter (HOSPITAL_BASED_OUTPATIENT_CLINIC_OR_DEPARTMENT_OTHER): Payer: No Typology Code available for payment source

## 2015-07-31 ENCOUNTER — Telehealth (HOSPITAL_BASED_OUTPATIENT_CLINIC_OR_DEPARTMENT_OTHER): Payer: Self-pay

## 2015-07-31 ENCOUNTER — Telehealth (INDEPENDENT_AMBULATORY_CARE_PROVIDER_SITE_OTHER): Payer: Self-pay | Admitting: Internal Medicine

## 2015-07-31 ENCOUNTER — Ambulatory Visit (INDEPENDENT_AMBULATORY_CARE_PROVIDER_SITE_OTHER): Payer: No Typology Code available for payment source | Admitting: Family Medicine

## 2015-07-31 VITALS — BP 110/65 | HR 76 | Temp 98.4°F | Resp 16

## 2015-07-31 DIAGNOSIS — W19XXXA Unspecified fall, initial encounter: Secondary | ICD-10-CM

## 2015-07-31 DIAGNOSIS — S8012XA Contusion of left lower leg, initial encounter: Secondary | ICD-10-CM

## 2015-07-31 NOTE — Progress Notes (Signed)
[facility]        SUBJECTIVE:  Caitlin Wiley is an 65 year old female who presents with   Chief Complaint   Patient presents with   . Leg Pain     fell on left knee last night-patient is on warf       Onset:  1 day ago, she tripped on her slipper and fell on her deck  Symptom/s:  Pain immediately on left upper leg, developed bruising in 2 hours and put a marker around it but today, it is a lot bigger. She spoke with the nurse of her cardiologist and was told to get an INR and xray.  She said she was able to stand immediately after the fall, no wight bearing difficulty although endorses that she has had bilateral knee issues due to arthritis.  Quality:  Aching and Dull  Severity: 7/10 tender to touch  Precipitating/Aggravating factor: palpation  Alleviating factors: rest  Treatment tried: elevation        Review of Systems   Constitutional: Negative for chills and fever.   Respiratory: Negative for shortness of breath.    Cardiovascular: Negative for chest pain and palpitations.   Musculoskeletal: Negative for gait problem and joint swelling.   Neurological: Negative for weakness and numbness.       I have  personally reviewed the past medical history and social history with the patient.        Review of patient's allergies indicates:  Allergies   Allergen Reactions   . Bee Venom    . Cats [Animals]    . Ciprofloxacin      Joint aching   . Dust Mite Extract      DUST   . Pollen Extract      POLLEN   . Sulfa Antibiotics        OBJECTIVE:  BP 110/65   Pulse 76   Temp 98.4 F (36.9 C) (Temporal)   Resp 16     Physical Exam   Constitutional: She is well-developed, well-nourished, and in no distress.   Musculoskeletal:        Left lower leg: She exhibits tenderness (diffusely on her left knee and leg), bony tenderness, swelling and edema.        Legs:     Results for orders placed or performed in visit on 07/31/15   PROTHROMBIN TIME (RAPID INR)   Result Value Ref Range    Prothrombin INR 3.1     Prothrombin Time Patient 37.0          Assessment/Plan:    1. Contusion of left leg, initial encounter     - XR TIBIA FIBULA 2 VW LEFT Reviewed with radiologist Dr. Quita Skye. No fracture seen (+) soft tissue swelling on prox tibia, with calcif on achilles. Will notify patient of any changes from final xray reading.   - PROTHROMBIN TIME (RAPID INR) slightly elevated, likely explaining the increased bruising. Patient said she will talk to her card's nurse regarding this and adjustment if needed.  - discussed also DVT due to immobility related to this injury, although unlikely considering her INR. If with increasing swelling, pain redness, follow up advised.  - Rest, ice, compression, elevation. Ace bandage applied.  - analgesics as needed. Patient has some at home. Cautioned about NSAIDs.    Patient expressed understanding of treatment options discussed, including benefits and risks, questions encouraged and all answered.

## 2015-07-31 NOTE — Telephone Encounter (Signed)
Caitlin Wiley [phone] ok to leave detailed msg.     Patient requesting Xray knee. Lives in [place],   [facility] is the closest [facility] facility to [place]?  Patient fell onto her knee yesterday (07/30/2015).

## 2015-07-31 NOTE — Progress Notes (Signed)
Xray findings discussed with patient while in clinic.

## 2015-07-31 NOTE — Telephone Encounter (Signed)
Apt to be seen today by me or any PCP here if needed.

## 2015-07-31 NOTE — Telephone Encounter (Signed)
Patient states knee is not broken, INR was 3.1. Asked patient to hold coumadin X 1 dose and than resume current dosing. Patient asked to take Tylenol for pain if needed.

## 2015-07-31 NOTE — Telephone Encounter (Signed)
Patient reports INR yesterday at [facility] 3.0. Patient fell on her knee. Patient is trying to get additional evaluation care today.     Patient will call with an update later on today.

## 2015-07-31 NOTE — Telephone Encounter (Signed)
Does pt need to be seen prior to ordering an xray? See below.

## 2015-07-31 NOTE — Patient Instructions (Signed)
Lower Extremity Contusion  You have a contusion (bruise) of a lower extremity (leg, knee, ankle, foot, or toe). Symptoms include pain, swelling, and skin discoloration. No bones are broken. This injury may take from a few days to a few weeks to heal. During that time, the bruise may change from reddish in color, to purple-blue, to green-yellow, to yellow-brown.  Home care   Unless another medicine was prescribed, you can take acetaminophen, ibuprofen, or naproxento control pain. (If you have chronic liver or kidney disease or ever had a stomach ulcer or gastrointestinal bleeding, talk with your doctor before using these medicines.)   Elevate the injured area to reduce pain and swelling.As much as possible, sit or lie down with the injured area raised about the level of your heart. This is especially important during the first 48 hours.   Ice the injured area to help reduce pain and swelling.Wrap a cold source (ice packor ice cubes in a plastic bag) in a thin towel. Apply to the bruised area for 20 minutes every 1 to 2 hours the first day. Continue this 3 to 4 times a day until the pain and swelling goes away.   If crutches have been advised, do not bear full weight on the injured leg until you can do so without pain. You may return to sports when you are able to put full weight and impact on the injured leg without pain.  Follow up  Follow up with your healthcare provider or our staff as advised. Call if you are not improving within the next1 to 2 weeks.  When to seek medical advice  Call your healthcare provider right away if any of these occur:   Increased pain or swelling   Foot or toes becomecold, blue, numb or tingly   Signs of infection:Warmth, drainage, or increased redness or pain around the injury   Inability to move the injured area   Frequent bruising for unknown reasons  Date Last Reviewed: 02/19/2015   2000-2017 [employer], [place] [address].  All rights reserved. This information is not intended as a substitute for professional medical care. Always follow your healthcare professional's instructions.

## 2015-08-02 ENCOUNTER — Ambulatory Visit: Payer: Self-pay

## 2015-08-02 ENCOUNTER — Encounter (INDEPENDENT_AMBULATORY_CARE_PROVIDER_SITE_OTHER): Payer: Self-pay | Admitting: Family Medicine

## 2015-08-02 ENCOUNTER — Ambulatory Visit (INDEPENDENT_AMBULATORY_CARE_PROVIDER_SITE_OTHER): Payer: No Typology Code available for payment source | Admitting: Family Medicine

## 2015-08-02 VITALS — BP 114/70 | HR 79 | Temp 96.3°F | Wt 332.2 lb

## 2015-08-02 DIAGNOSIS — S8012XD Contusion of left lower leg, subsequent encounter: Secondary | ICD-10-CM

## 2015-08-02 DIAGNOSIS — Z5181 Encounter for therapeutic drug level monitoring: Secondary | ICD-10-CM

## 2015-08-02 DIAGNOSIS — Z7901 Long term (current) use of anticoagulants: Secondary | ICD-10-CM

## 2015-08-02 DIAGNOSIS — W19XXXD Unspecified fall, subsequent encounter: Secondary | ICD-10-CM

## 2015-08-02 DIAGNOSIS — S8002XD Contusion of left knee, subsequent encounter: Secondary | ICD-10-CM

## 2015-08-02 DIAGNOSIS — Z6841 Body Mass Index (BMI) 40.0 and over, adult: Secondary | ICD-10-CM

## 2015-08-02 DIAGNOSIS — R609 Edema, unspecified: Secondary | ICD-10-CM

## 2015-08-02 LAB — PR PROTHROMBIN TIME, ONSITE
Prothrombin Time Patient: 29.4 s — ABNORMAL HIGH (ref 10.7–15.6)
prothrombin INR: 2.4

## 2015-08-02 NOTE — Telephone Encounter (Signed)
Has already gone to [facility]    Reason for Disposition  . Health Information question, no triage required and triager able to answer question    Protocols used: INFORMATION ONLY CALL-ADULT-AH

## 2015-08-02 NOTE — Progress Notes (Signed)
[place] CARE      Chief Complaint   Patient presents with   . Bleeding/Bruising     Blisters and bruising/blisters are a new symptom/LEFT lower leg/on warfarin/area of swelling has increased///hdcma       SUBJECTIVE:  Caitlin Wiley is an 65 year old female who presents with concerns regarding worsening bruising and blister formation on her left leg  She fell on the left knee 3 days ago  She was seen in urgent care the following day due to bruising and swelling  Xray was negative  INR 2 days ago was 3.1 and she was instructed to hold one dose  She has noted increasing swelling and bruising going down the leg    Review of Systems   Constitutional: Negative for fever.   Cardiovascular: Positive for leg swelling. Negative for claudication.   Musculoskeletal: Positive for falls.        Lower leg pain on the left   Neurological: Negative for tingling, sensory change and focal weakness.   Endo/Heme/Allergies: Bruises/bleeds easily.       I have  personally reviewed the medications, allergies, ROS with the patient.        Review of patient's allergies indicates:  Allergies   Allergen Reactions   . Bee Venom    . Cats [Animals]    . Ciprofloxacin      Joint aching   . Dust Mite Extract      DUST   . Pollen Extract      POLLEN   . Sulfa Antibiotics        OBJECTIVE:  BP 114/70   Pulse 79   Temp 96.3 F (35.7 C) (Temporal)   Wt (!) 332 lb 3.7 oz (150.7 kg)   SpO2 95%   BMI 52.82 kg/m   Physical Exam   Gen: A&O, NAD  Msk: tender to palpation just below the left knee, normal gait  Skin: she has 3 x small bullae that have formed just inferior to the site of the injury, bruising at the injury site is starting to yellow, the skin is warm but not erythematous, there is dependent bruising to the ankle  Ext: significant left lower leg     INR 2.4    A/P  1. Contusion of knee and lower leg, left, subsequent encounter  Suspect she has a deep hematoma  No evidence of infection  Continue to  elevate the leg, ice the site of injury  Do not pop bullae.  Can use antibiotic ointment and non-adherent dressing if they spontaneously pop  Follow up with PCP for re-check in 1-2 weeks, sooner if symptoms acutely worsen  - PROTHROMBIN TIME, ONSITE    2. Chronic anticoagulation  INR at goal.  She will follow up with her anticoagulation clinic next week  - PROTHROMBIN TIME, ONSITE    3. Dependent edema  Elevate leg frequently

## 2015-08-02 NOTE — Telephone Encounter (Signed)
Regarding: [facility] pt- pt fell a few days ago leg is still really swollen and blisters have formed   ----- Message from Estelle June sent at 08/02/2015 11:12 AM PDT -----  [facility] pt- pt fell a few days ago leg is still really swollen and blisters have formed

## 2015-08-04 ENCOUNTER — Telehealth (HOSPITAL_BASED_OUTPATIENT_CLINIC_OR_DEPARTMENT_OTHER): Payer: Self-pay

## 2015-08-04 NOTE — Telephone Encounter (Signed)
Called pt, bruise from fall continues to spread but she was seen by urgent care this weekend and was advised to RICE her leg. She did not mention any new symptoms except for fever, we discussed APAP is acceptable while on coumadin therapy. She will come in next week for follow on INR.

## 2015-08-04 NOTE — Telephone Encounter (Signed)
Pt called stating that she had a fall last week and would like to discuss her next INR instructions.     Routing to [place] ADVICE/REQUEST.    Thank you     Fransico Michael  [facility] MEDICINE  [facility]

## 2015-08-06 ENCOUNTER — Emergency Department
Admission: EM | Admit: 2015-08-06 | Discharge: 2015-08-06 | Disposition: A | Discharge status: Home / Self Care | Payer: No Typology Code available for payment source | Attending: Emergency Medicine | Admitting: Emergency Medicine

## 2015-08-06 ENCOUNTER — Encounter (INDEPENDENT_AMBULATORY_CARE_PROVIDER_SITE_OTHER): Payer: Self-pay

## 2015-08-06 DIAGNOSIS — Y9289 Other specified places as the place of occurrence of the external cause: Secondary | ICD-10-CM | POA: Insufficient documentation

## 2015-08-06 DIAGNOSIS — Z882 Allergy status to sulfonamides status: Secondary | ICD-10-CM | POA: Insufficient documentation

## 2015-08-06 DIAGNOSIS — Y9301 Activity, walking, marching and hiking: Secondary | ICD-10-CM | POA: Insufficient documentation

## 2015-08-06 DIAGNOSIS — S8012XA Contusion of left lower leg, initial encounter: Secondary | ICD-10-CM | POA: Insufficient documentation

## 2015-08-06 DIAGNOSIS — W19XXXA Unspecified fall, initial encounter: Secondary | ICD-10-CM | POA: Insufficient documentation

## 2015-08-06 DIAGNOSIS — Z9889 Other specified postprocedural states: Secondary | ICD-10-CM | POA: Insufficient documentation

## 2015-08-06 DIAGNOSIS — Z888 Allergy status to other drugs, medicaments and biological substances status: Secondary | ICD-10-CM | POA: Insufficient documentation

## 2015-08-06 DIAGNOSIS — I4891 Unspecified atrial fibrillation: Secondary | ICD-10-CM | POA: Insufficient documentation

## 2015-08-06 LAB — COMPREHENSIVE METABOLIC PANEL
ALT (GPT): 19 U/L (ref 7–33)
AST (GOT): 25 U/L (ref 9–38)
Albumin: 3.6 g/dL (ref 3.5–5.2)
Alkaline Phosphatase (Total): 75 U/L (ref 31–132)
Anion Gap: 12 (ref 4–12)
Bilirubin (Total): 1.6 mg/dL — ABNORMAL HIGH (ref 0.2–1.3)
Calcium: 8.9 mg/dL (ref 8.9–10.2)
Carbon Dioxide, Total: 29 meq/L (ref 22–32)
Chloride: 99 meq/L (ref 98–108)
Creatinine: 0.79 mg/dL (ref 0.38–1.02)
GFR, Calc, African American: >60 mL/min/{1.73_m2} (ref >59)
GFR, Calc, European American: >60 mL/min/{1.73_m2} (ref >59)
Glucose: 129 mg/dL — ABNORMAL HIGH (ref 62–125)
Potassium: 3.7 meq/L (ref 3.6–5.2)
Protein (Total): 6.7 g/dL (ref 6.0–8.2)
Sodium: 140 meq/L (ref 135–145)
Urea Nitrogen: 20 mg/dL (ref 8–21)

## 2015-08-06 LAB — CBC, DIFF
% Basophils: 1 %
% Eosinophils: 2 %
% Immature Granulocytes: 1 %
% Lymphocytes: 17 %
% Monocytes: 7 %
% Neutrophils: 72 %
% Nucleated RBC: 0 %
Absolute Eosinophil Count: 0.25 10*3/uL (ref 0.00–0.50)
Absolute Lymphocyte Count: 2.15 10*3/uL (ref 1.00–4.80)
Basophils: 0.07 10*3/uL (ref 0.00–0.20)
Hematocrit: 33 % — ABNORMAL LOW (ref 36–45)
Hemoglobin: 10.7 g/dL — ABNORMAL LOW (ref 11.5–15.5)
Immature Granulocytes: 0.18 10*3/uL — ABNORMAL HIGH (ref 0.00–0.05)
MCH: 28.4 pg (ref 27.3–33.6)
MCHC: 32.3 g/dL (ref 32.2–36.5)
MCV: 88 fL (ref 81–98)
Monocytes: 0.83 10*3/uL — ABNORMAL HIGH (ref 0.00–0.80)
Neutrophils: 9.11 10*3/uL — ABNORMAL HIGH (ref 1.80–7.00)
Nucleated RBC: 0.04 10*3/uL — ABNORMAL HIGH
Platelet Count: 338 10*3/uL (ref 150–400)
RBC: 3.77 10*6/uL — ABNORMAL LOW (ref 3.80–5.00)
RDW-CV: 15.5 % — ABNORMAL HIGH (ref 11.6–14.4)
WBC: 12.59 10*3/uL — ABNORMAL HIGH (ref 4.3–10.0)

## 2015-08-06 LAB — PROTHROMBIN TIME (NWH)
Prothrombin INR: 2.7 — ABNORMAL HIGH (ref 0.8–1.3)
Prothrombin Time Patient, NWH: 25.8 s — ABNORMAL HIGH (ref 8.9–12.1)

## 2015-08-06 NOTE — Telephone Encounter (Signed)
I spoke with the patient who states that her pain level has actually increased to a level VIII with weightbearing.  She has some blisterlike lesions with a new one forming yesterday.  Due to the severity of pain and the leg swelling I have recommended she go to the emergency room and I did call and presented her case to the triage nurse at [facility].

## 2015-08-07 ENCOUNTER — Telehealth (HOSPITAL_BASED_OUTPATIENT_CLINIC_OR_DEPARTMENT_OTHER): Payer: Self-pay

## 2015-08-07 NOTE — Telephone Encounter (Signed)
Pt seen at [facility] ED for hematoma (INR 2.7), she was dc home, she states the leg is not increasing in swelling from our last conversation. She was advised to hold her coumadin last night by ED and to hold it tonight by Dr Ivan Croft. She will be seen tomorrow for INR.

## 2015-08-08 ENCOUNTER — Ambulatory Visit: Payer: No Typology Code available for payment source

## 2015-08-08 ENCOUNTER — Ambulatory Visit (HOSPITAL_BASED_OUTPATIENT_CLINIC_OR_DEPARTMENT_OTHER): Payer: No Typology Code available for payment source

## 2015-08-08 VITALS — BP 124/72 | HR 72 | Wt 326.0 lb

## 2015-08-08 DIAGNOSIS — Z6841 Body Mass Index (BMI) 40.0 and over, adult: Secondary | ICD-10-CM

## 2015-08-08 DIAGNOSIS — I480 Paroxysmal atrial fibrillation: Secondary | ICD-10-CM | POA: Insufficient documentation

## 2015-08-08 NOTE — Progress Notes (Signed)
Cardiovascular Clinical Evaluation Note    Primary Care Provider: Corey Harold, MD    Referring Provider: No ref. provider found    DOB:  Jul 21, 1950    CHIEF COMPLAINT: Knee injury on Coumadin and hematoma    HISTORY OF PRESENT ILLNESS: The patient is on Coumadin for atrial fibrillation.  She fell on her left knee.  She had a lot of swelling and was totally seen in outpatient emergency room where an x-ray did not show fracture.  Phone conversation with me included stopping Coumadin and follow-up in my office.  She is remained in sinus rhythm.  She has a very impressive hematoma over the left lower leg which does not involve the knee joint.  She has good distal pulses.  She can move her toes without problem.  There is no sign of compartment syndrome.  She has a blood-filled blister on the lateral aspect which is 3 cm x 1.5 cm and a fluid-filled blister 1.5 x 2 cm near this blister as well.  She has significant ecchymosis going down to her toes.  The blood appears to be above the fascia.    MEDICATIONS:    Current Outpatient Prescriptions   Medication Sig Dispense Refill   . Albuterol Sulfate HFA (VENTOLIN HFA) 108 (90 Base) MCG/ACT Inhalation Aero Soln Inhale 1-2 puffs by mouth 4 times a day as needed for shortness of breath/wheezing. 18 g 2   . Ascorbic Acid (VITAMIN C OR) 500 mg     . ASPIRIN OR 81 mg     . Flaxseed, Linseed, (FLAXSEED OIL) 1200 MG Oral Cap None Entered     . Fluticasone Propionate 50 MCG/ACT Nasal Suspension Spray 2 sprays into each nostril daily. 1 Inhaler 3   . Fluticasone Propionate HFA (FLOVENT HFA) 110 MCG/ACT Inhalation Aerosol Inhale 2 puffs by mouth 2 times a day. - RINSE MOUTH AFTER USE 3 Inhaler 1   . GLUCOSAMINE CHONDROITIN COMPLX OR      . Lansoprazole 30 MG Oral CAPSULE DELAYED RELEASE Take 1 capsule (30 mg) by mouth daily. 90 capsule 1   . Magnesium 400 MG Oral Cap Take 400 mg by mouth.     . MethylPREDNISolone, Pak, 4 MG Oral Tablet Therapy Pack Take by mouth as directed on  package labeling for duration of 6 days. 21 tablet 0   . Montelukast Sodium 10 MG Oral Tab Take 1 tablet (10 mg) by mouth daily. 34 tablet 5   . Multiple Vitamins-Minerals (MULTIVITAMIN OR) no iron     . Probiotic Product (PROBIOTIC DAILY OR)      . Sotalol HCl 80 MG Oral Tab Take 1 tablet (80 mg) by mouth 2 times a day. 180 tablet 3   . Triamterene-HCTZ 37.5-25 MG Oral Tab Take 1 tablet by mouth daily. 90 tablet 3   . Warfarin Sodium (COUMADIN) 5 MG Oral Tab Take 7.5mg  X 2 days a week (T,Th) and 5mg  X 5 days a week or as instructed by your physician. 110 tablet 0     No current facility-administered medications for this visit.        ALLERGIES:  Bee venom; Cats [animals]; Ciprofloxacin; Dust mite extract; Pollen extract; and Sulfa antibiotics      PROBLEM LIST:  Patient Active Problem List    Diagnosis Date Noted   . Chronic anticoagulation [Z79.01] 08/02/2015   . Right cervical radiculopathy [M54.12] 05/30/2015   . Obesity [E66.9] 05/30/2015   . Chronic atrial fibrillation ([place]) [I48.2] 04/17/2014   .  GERD (gastroesophageal reflux disease) [K21.9] 11/08/2012   . Palpitations [R00.2] 08/21/2012   . OA (osteoarthritis) [M19.90] 07/28/2012   . Asthma [J45.909] 07/24/2012   . Hypercholesterolemia [E78.00] 07/24/2012   . Hx of colonic polyps--Dr Read--03/21/10  cleared for 5 years [Z86.010] 07/24/2012     Colonoscopy 01/2005     . Edema [R60.9] 07/24/2012   . Sleep apnea [G47.30] 07/24/2012   . HTN (hypertension) [I10] 07/24/2012   . Menopausal state [N95.1] 07/24/2012   . Rash [R21] 07/24/2012   . Pulmonary nodule [R91.1] 07/24/2012   . Grief reaction [F43.20] 07/24/2012     05/11/2011     . Strain of thoracic spine [S29.019A] 07/24/2012       REVIEW OF SYSTEMS   CONSTITUTIONAL:  No fever, weight loss.  EYES:  No visual loss.  ENT:  No hearing loss, exudate.  CARDIOVASCULAR:  See HPI.  GI:  No melena, hematochezia, hematemesis.  SKIN:  No lesions.  GU:  No dysuria, hematuria.  MUSCULOSKELETAL:  No myalgias, no  arthralgias.  NEURO:  No TIA or CVA symptoms.  ALLERGIC/IMMUNOLOGIC:  No hives or rash.  PSYCHIATRIC:  No suicidal ideations.    PHYSICAL EXAM    CONSTITUTIONAL: This is a well-nourished female in no distress.    BP 124/72   Pulse 72   Wt (!) 326 lb (147.9 kg)   SpO2 96%   BMI 51.83 kg/m   NEURO/PSYCH:  Oriented x 3 with normal affect.  EYES:  Conjunctivae pink, sclerae clear.  E/N/T:  Oral mucosa pink, dentition normal.  NECK:  Jugular venous pressure normal.  Thyroid is not enlarged.  RESPIRATORY:  Respiratory effort is normal.  Lungs are clear to percussion/auscultation.  CARDIOVASCULAR:  PMI at 5th ICS/MCL.  Regular rhythm.  S1, S2 normal.  No S3, S4, no murmur.  Carotid upstrokes are brisk, without bruits.  Abdominal aorta normal to palpation, no bruit.  Femoral pulses normal, no bruits.  Pedal pulses normal bilaterally.  Extremities description noted above.  GASTROINTESTINAL:  Abdomen without masses or tenderness.  No hepatosplenomegaly.  EXTREMITIES:  No clubbing or cyanosis.  MUSCULOSKELETAL:  Normal gait and muscle strength.  SKIN:  Warm and dry, no lesions.    IMPRESSION: Patient with significant epifascial hematoma and now off Coumadin.  I recommended stopping the Coumadin for one week.  We will see her next week for repeat evaluation.  If no further bleeding is identified and healing occurs normally would consider restarting Coumadin in one week.        [name], MD [place]  08/08/2015  4:27 PM    Electronically transcribed by voice recognition technology.  Although every effort was made to ensure accuracy, some transcription errors may occur.    CC:

## 2015-08-13 ENCOUNTER — Ambulatory Visit: Payer: No Typology Code available for payment source

## 2015-08-13 NOTE — Progress Notes (Signed)
Patient's swelling is improving on left leg and foot. Pulses are strong and foot is warm to touch. Top blister has opened and site was reviewed by Dr. Dorothyann Peng who is not concerned about infection. Have asked patient to remain off of coumadin until Friday when RN will recheck site and if appropriate resume coumadin. If any changes occur patient asked to call office.

## 2015-08-15 ENCOUNTER — Ambulatory Visit: Payer: No Typology Code available for payment source

## 2015-08-15 NOTE — Progress Notes (Signed)
Wound continues to improve. Pulses are strong and no signs or symptoms of numbness. Patient will resume coumadin tonight and normal dosing and return next week for a wound and protime check. Patient will call office if any concerns arise.

## 2015-08-18 ENCOUNTER — Ambulatory Visit: Payer: No Typology Code available for payment source

## 2015-08-18 ENCOUNTER — Ambulatory Visit (HOSPITAL_BASED_OUTPATIENT_CLINIC_OR_DEPARTMENT_OTHER): Payer: No Typology Code available for payment source

## 2015-08-18 DIAGNOSIS — I482 Chronic atrial fibrillation, unspecified: Secondary | ICD-10-CM

## 2015-08-18 LAB — PR PROTHROMBIN TIME, ONSITE: prothrombin INR: 1

## 2015-08-18 NOTE — Progress Notes (Signed)
INR 1.0 - restarted Warfarin on 7/28 - will continue current dose of Warfarin with a recheck on 8/7.    Left leg improving per pt report and from my review of previous notes.  Strong pedal pulses, warm with good capillary refill. Skin intact over previously blistered area.    Sallye Ober,  RN  [place]

## 2015-08-19 ENCOUNTER — Telehealth: Payer: Self-pay

## 2015-08-19 NOTE — Telephone Encounter (Signed)
Called patient to inform them of their health maintenance/care gap needs. Patient was unavailable.

## 2015-08-25 ENCOUNTER — Ambulatory Visit: Payer: No Typology Code available for payment source

## 2015-08-25 DIAGNOSIS — I482 Chronic atrial fibrillation, unspecified: Secondary | ICD-10-CM

## 2015-08-25 LAB — PR PROTHROMBIN TIME, ONSITE: prothrombin INR: 2

## 2015-08-25 NOTE — Progress Notes (Signed)
Continue 7.5 mg X 2 days a week (T,Th) and 5mg  X 5 days and re check INR in 2 weeks.

## 2015-08-28 ENCOUNTER — Other Ambulatory Visit: Payer: Self-pay

## 2015-08-28 DIAGNOSIS — J4520 Mild intermittent asthma, uncomplicated: Secondary | ICD-10-CM

## 2015-08-28 NOTE — Telephone Encounter (Signed)
The patient last received this medication at the requesting pharmacy on 08/24/15

## 2015-08-29 MED ORDER — MONTELUKAST SODIUM 10 MG OR TABS
10.0000 mg | ORAL_TABLET | Freq: Every day | ORAL | 5 refills | Status: DC
Start: 2015-08-29 — End: 2016-03-10

## 2015-09-01 ENCOUNTER — Other Ambulatory Visit (HOSPITAL_BASED_OUTPATIENT_CLINIC_OR_DEPARTMENT_OTHER): Payer: Self-pay

## 2015-09-01 DIAGNOSIS — I480 Paroxysmal atrial fibrillation: Secondary | ICD-10-CM

## 2015-09-01 MED ORDER — WARFARIN SODIUM 5 MG OR TABS
ORAL_TABLET | ORAL | 1 refills | Status: DC
Start: 2015-09-01 — End: 2015-11-19

## 2015-09-08 ENCOUNTER — Ambulatory Visit (HOSPITAL_BASED_OUTPATIENT_CLINIC_OR_DEPARTMENT_OTHER): Payer: No Typology Code available for payment source

## 2015-09-08 ENCOUNTER — Ambulatory Visit: Payer: No Typology Code available for payment source

## 2015-09-08 ENCOUNTER — Encounter (HOSPITAL_BASED_OUTPATIENT_CLINIC_OR_DEPARTMENT_OTHER): Payer: Self-pay

## 2015-09-08 VITALS — BP 112/74 | HR 86 | Wt 329.8 lb

## 2015-09-08 DIAGNOSIS — I482 Chronic atrial fibrillation, unspecified: Secondary | ICD-10-CM

## 2015-09-08 DIAGNOSIS — Z6841 Body Mass Index (BMI) 40.0 and over, adult: Secondary | ICD-10-CM

## 2015-09-08 DIAGNOSIS — I480 Paroxysmal atrial fibrillation: Secondary | ICD-10-CM | POA: Insufficient documentation

## 2015-09-08 LAB — PR PROTHROMBIN TIME, ONSITE: prothrombin INR: 2.7

## 2015-09-08 NOTE — Progress Notes (Signed)
Cardiovascular Clinical Evaluation Note    Primary Care Provider: Corey Harold, MD    Referring Provider: No ref. provider found    DOB:  Feb 07, 1950    CHIEF COMPLAINT: Follow-up of hematoma after fall.    HISTORY OF PRESENT ILLNESS: She is back on Coumadin tolerating a well.  The hematoma slowly resolving.  She has no new symptoms.  Clinically she is in sinus rhythm.    MEDICATIONS:    Current Outpatient Prescriptions   Medication Sig Dispense Refill   . Albuterol Sulfate HFA (VENTOLIN HFA) 108 (90 Base) MCG/ACT Inhalation Aero Soln Inhale 1-2 puffs by mouth 4 times a day as needed for shortness of breath/wheezing. 18 g 2   . Ascorbic Acid (VITAMIN C OR) 500 mg     . ASPIRIN OR 81 mg     . Flaxseed, Linseed, (FLAXSEED OIL) 1200 MG Oral Cap None Entered     . Fluticasone Propionate 50 MCG/ACT Nasal Suspension Spray 2 sprays into each nostril daily. 1 Inhaler 3   . Fluticasone Propionate HFA (FLOVENT HFA) 110 MCG/ACT Inhalation Aerosol Inhale 2 puffs by mouth 2 times a day. - RINSE MOUTH AFTER USE 3 Inhaler 1   . GLUCOSAMINE CHONDROITIN COMPLX OR      . Lansoprazole 30 MG Oral CAPSULE DELAYED RELEASE Take 1 capsule (30 mg) by mouth daily. 90 capsule 1   . Magnesium 400 MG Oral Cap Take 400 mg by mouth.     . MethylPREDNISolone, Pak, 4 MG Oral Tablet Therapy Pack Take by mouth as directed on package labeling for duration of 6 days. 21 tablet 0   . Montelukast Sodium 10 MG Oral Tab Take 1 tablet (10 mg) by mouth daily. 34 tablet 5   . Multiple Vitamins-Minerals (MULTIVITAMIN OR) no iron     . Probiotic Product (PROBIOTIC DAILY OR)      . Sotalol HCl 80 MG Oral Tab Take 1 tablet (80 mg) by mouth 2 times a day. 180 tablet 3   . Triamterene-HCTZ 37.5-25 MG Oral Tab Take 1 tablet by mouth daily. 90 tablet 3   . Warfarin Sodium (COUMADIN) 5 MG Oral Tab Take 7.5mg  X 2 days a week (T,Th) and 5mg  X 5 days a week or as instructed by your physician. 110 tablet 1     No current facility-administered medications for this  visit.        ALLERGIES:  Bee venom; Cats [animals]; Ciprofloxacin; Dust mite extract; Pollen extract; and Sulfa antibiotics      PROBLEM LIST:  Patient Active Problem List    Diagnosis Date Noted   . Chronic anticoagulation [Z79.01] 08/02/2015   . Right cervical radiculopathy [M54.12] 05/30/2015   . Obesity [E66.9] 05/30/2015   . Chronic atrial fibrillation ([place]) [I48.2] 04/17/2014   . GERD (gastroesophageal reflux disease) [K21.9] 11/08/2012   . Palpitations [R00.2] 08/21/2012   . OA (osteoarthritis) [M19.90] 07/28/2012   . Asthma [J45.909] 07/24/2012   . Hypercholesterolemia [E78.00] 07/24/2012   . Hx of colonic polyps--Dr Read--03/21/10  cleared for 5 years [Z86.010] 07/24/2012     Colonoscopy 01/2005     . Edema [R60.9] 07/24/2012   . Sleep apnea [G47.30] 07/24/2012   . HTN (hypertension) [I10] 07/24/2012   . Menopausal state [N95.1] 07/24/2012   . Rash [R21] 07/24/2012   . Pulmonary nodule [R91.1] 07/24/2012   . Grief reaction [F43.20] 07/24/2012     05/11/2011     . Strain of thoracic spine [S29.019A] 07/24/2012  REVIEW OF SYSTEMS   CONSTITUTIONAL:  No fever, weight loss.  EYES:  No visual loss.  ENT:  No hearing loss, exudate.  CARDIOVASCULAR:  See HPI.  GI:  No melena, hematochezia, hematemesis.  SKIN:  No lesions.  GU:  No dysuria, hematuria.  MUSCULOSKELETAL:  No myalgias, no arthralgias.  NEURO:  No TIA or CVA symptoms.  ALLERGIC/IMMUNOLOGIC:  No hives or rash.  PSYCHIATRIC:  No suicidal ideations.    PHYSICAL EXAM    CONSTITUTIONAL: This is a well-nourished female in no distress.    BP 112/74   Pulse 86   Wt (!) 329 lb 12.8 oz (149.6 kg)   SpO2 94%   BMI 52.43 kg/m   NEURO/PSYCH:  Oriented x 3 with normal affect.  EYES:  Conjunctivae pink, sclerae clear.  E/N/T:  Oral mucosa pink, dentition normal.  NECK:  Jugular venous pressure normal.  Thyroid is not enlarged.  RESPIRATORY:  Respiratory effort is normal.  Lungs are clear to percussion/auscultation.  CARDIOVASCULAR:  PMI at 5th ICS/MCL.  Regular  rhythm.  S1, S2 normal.  No S3, S4, no murmur.  Carotid upstrokes are brisk, without bruits.  Abdominal aorta normal to palpation, no bruit.  Femoral pulses normal, no bruits.  Pedal pulses normal bilaterally.  Extremities extremity on the hematoma site has improved dramatically.  She still has some skin discoloration and probably some trauma under the skin.  GASTROINTESTINAL:  Abdomen without masses or tenderness.  No hepatosplenomegaly.  EXTREMITIES:  No clubbing or cyanosis.  MUSCULOSKELETAL:  Normal gait and muscle strength.  SKIN:  Warm and dry, no lesions.    IMPRESSION: Back on Coumadin without worsening of hematoma.  Clinically in sinus rhythm.  Hematoma slowly resolving.      [name], MD [facility]  09/08/2015  4:09 PM    Electronically transcribed by voice recognition technology.  Although every effort was made to ensure accuracy, some transcription errors may occur.    CC:

## 2015-09-08 NOTE — Progress Notes (Signed)
INR 2.7 - will continue current dose of Warfarin which is 2.5 mg T/Th and 5 mg other days of the week.  Sallye Ober,  RN  [place]

## 2015-09-08 NOTE — Progress Notes (Signed)
Chart notes, labs, pharmacy, outside records and medications were obtained and reviewed.

## 2015-09-23 ENCOUNTER — Ambulatory Visit (HOSPITAL_BASED_OUTPATIENT_CLINIC_OR_DEPARTMENT_OTHER): Payer: No Typology Code available for payment source

## 2015-09-23 ENCOUNTER — Ambulatory Visit: Payer: No Typology Code available for payment source | Attending: Internal Medicine | Admitting: Internal Medicine

## 2015-09-23 ENCOUNTER — Encounter (HOSPITAL_BASED_OUTPATIENT_CLINIC_OR_DEPARTMENT_OTHER): Payer: Self-pay | Admitting: Internal Medicine

## 2015-09-23 VITALS — BP 110/62 | HR 118 | Ht 66.5 in | Wt 326.6 lb

## 2015-09-23 DIAGNOSIS — Z6841 Body Mass Index (BMI) 40.0 and over, adult: Secondary | ICD-10-CM

## 2015-09-23 DIAGNOSIS — I480 Paroxysmal atrial fibrillation: Secondary | ICD-10-CM | POA: Insufficient documentation

## 2015-09-23 DIAGNOSIS — E7800 Pure hypercholesterolemia, unspecified: Secondary | ICD-10-CM | POA: Insufficient documentation

## 2015-09-23 DIAGNOSIS — R9431 Abnormal electrocardiogram [ECG] [EKG]: Secondary | ICD-10-CM

## 2015-09-23 DIAGNOSIS — I4891 Unspecified atrial fibrillation: Secondary | ICD-10-CM

## 2015-09-23 DIAGNOSIS — I10 Essential (primary) hypertension: Secondary | ICD-10-CM | POA: Insufficient documentation

## 2015-09-23 DIAGNOSIS — Z7901 Long term (current) use of anticoagulants: Secondary | ICD-10-CM | POA: Insufficient documentation

## 2015-09-23 LAB — PR PROTHROMBIN TIME, ONSITE: prothrombin INR: 2.2

## 2015-09-23 NOTE — Progress Notes (Signed)
DATE:  09/23/2015    NAME: Caitlin Wiley  DOB:  [birth year]    Referring Provider:  No ref. provider found  Primary Care Provider:  Hagedorn, Caitlin Norris, MD    Caitlin Wiley is an 65 year old female who comes in for follow-up of atrial fibrillation.  The patient converted to atrial fibrillation 2 days ago.  With this, she has been increasingly fatigued and diaphoretic.  She denies pain, pressure or heaviness in her chest.  She does not feel particularly short of breath.  She denies paroxysmal nocturnal dyspnea or orthopnea, but sleeps with a CPAP mask in place.  She does feel her heart beating irregularly..  She has had no syncopal episodes.  She is tolerating her current medical regimen well.        PROBLEM LIST:    Patient Active Problem List    Diagnosis Date Noted   . Chronic anticoagulation 08/02/2015   . Right cervical radiculopathy 05/30/2015   . Obesity 05/30/2015   . Paroxysmal atrial fibrillation ([place]) 04/17/2014   . GERD (gastroesophageal reflux disease) 11/08/2012   . Palpitations 08/21/2012   . OA (osteoarthritis) 07/28/2012   . Asthma 07/24/2012   . Hypercholesterolemia 07/24/2012   . Hx of colonic polyps--Dr Read--03/21/10  cleared for 5 years 07/24/2012     Colonoscopy 01/2005     . Edema 07/24/2012   . Sleep apnea 07/24/2012   . HTN (hypertension) 07/24/2012   . Menopausal state 07/24/2012   . Rash 07/24/2012   . Pulmonary nodule 07/24/2012   . Grief reaction 07/24/2012     05/11/2011     . Strain of thoracic spine 07/24/2012         The patient has a past medical history of Anxiety; Cervical disc disease; GERD (gastroesophageal reflux disease); Leukocytosis; Muscle cramps (04/2011); Osteoarthritis; and Pain of toe of right foot (02/2010).  Past Surgical History:   Procedure Laterality Date   . COLONOSCOPY FLX DX W/COLLJ SPEC WHEN PFRMD  11/2005   . SURGICAL HX OTHER  08/1999    knee surgery       MEDICATION ALLERGIES AND INTOLERANCES:  Bee venom; Cats [animals]; Ciprofloxacin; Dust mite extract;  Pollen extract; and Sulfa antibiotics    MEDICATIONS:    Current Outpatient Prescriptions   Medication Sig Dispense Refill   . Albuterol Sulfate HFA (VENTOLIN HFA) 108 (90 Base) MCG/ACT Inhalation Aero Soln Inhale 1-2 puffs by mouth 4 times a day as needed for shortness of breath/wheezing. 18 g 2   . Ascorbic Acid (VITAMIN C OR) 500 mg     . ASPIRIN OR 81 mg     . Flaxseed, Linseed, (FLAXSEED OIL) 1200 MG Oral Cap None Entered     . Fluticasone Propionate 50 MCG/ACT Nasal Suspension Spray 2 sprays into each nostril daily. 1 Inhaler 3   . Fluticasone Propionate HFA (FLOVENT HFA) 110 MCG/ACT Inhalation Aerosol Inhale 2 puffs by mouth 2 times a day. - RINSE MOUTH AFTER USE 3 Inhaler 1   . GLUCOSAMINE CHONDROITIN COMPLX OR      . Lansoprazole 30 MG Oral CAPSULE DELAYED RELEASE Take 1 capsule (30 mg) by mouth daily. 90 capsule 1   . Magnesium 400 MG Oral Cap Take 400 mg by mouth.     . MethylPREDNISolone, Pak, 4 MG Oral Tablet Therapy Pack Take by mouth as directed on package labeling for duration of 6 days. 21 tablet 0   . Montelukast Sodium 10 MG Oral Tab Take 1 tablet (10  mg) by mouth daily. 34 tablet 5   . Multiple Vitamins-Minerals (MULTIVITAMIN OR) no iron     . Probiotic Product (PROBIOTIC DAILY OR)      . Sotalol HCl 80 MG Oral Tab Take 1 tablet (80 mg) by mouth 2 times a day. 180 tablet 3   . Triamterene-HCTZ 37.5-25 MG Oral Tab Take 1 tablet by mouth daily. 90 tablet 3   . Warfarin Sodium (COUMADIN) 5 MG Oral Tab Take 7.5mg  X 2 days a week (T,Th) and 5mg  X 5 days a week or as instructed by your physician. 110 tablet 1     No current facility-administered medications for this visit.      SOCIAL HISTORY:    Social History     Social History   . Marital status: N/A     Spouse name: N/A   . Number of children: 0   . Years of education: N/A     Occupational History   . Not on file.     Social History Main Topics   . Smoking status: Never Smoker   . Smokeless tobacco: Never Used   . Alcohol use 0.0 oz/week   . Drug  use: No   . Sexual activity: No     Other Topics Concern   . 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 Rare exercise.         DXA completed on 07/31/12, resulting in normal bone density of the lumbar spine, and normal bone density at the L hip; ordered 04/30/15        Mammogram performed on 05/27/14, resulting in no mammographic features of malignancy         Colonoscopy performed on 04/03/14 with Dr. Estill Bamberg, resulting in moderate diverticulosis of the left side of the colon, long redundant colon, cleared for seven years         TDAP- 03/13/10    Zostavax-advised 04/30/15        PAP-monitored by Dr. Harmon Pier        HIV screening, Hepatitis C screening-done in 2014       FAMILY HISTORY:    Family History   Problem Relation Age of Onset   . Arthritis Mother    . Breast Cancer Mother      & tongue cancer   . Mental Illness Mother    . Stroke Mother      possible   . Hypertension Mother    . Anesthesia Problems Mother    . Arthritis Father    . Cancer Father    . Diabetes Father    . Colon Polyps Father    . Heart Disease Father      torn valve 01/1999   . Hypertension Father    . Stroke Father    . Asthma Other      and/or allergies - grandfather   . Cancer Other      grandparents   . Tuberculosis Other      great-grandmother   . Arthritis Other      grandmother   . Breast Cancer Other      grandmother   . Heart Disease Maternal Grandfather    . Heart Disease Paternal Grandfather    . Mental Illness Brother    . Lupus Aunt/Uncle      great-aunt       REVIEW OF SYSTEMS:  Constitutional:  No fevers, weight loss.  Eyes: No visual loss.  ENT: No hearing loss, exudate.  Cardiovascular: See history of present illness.  Gastrointestinal: No melena, hematochezia or hematemesis.  Skin: No lesions.  Genitourinary: No dysuria, hematuria.  Musculoskeletal: No myalgias or arthralgias.  Neurological: No TIA or CVA symptoms.  Allergy/immunologic: No hives or rash.  Psychiatric:  No suicidal ideation.      PHYSICAL EXAMINATION reveals a well-appearing female, who is oriented and expresses a normal affect.  Vitals:  BP 110/62   Pulse (!) 118   Ht 5' 6.5" (1.689 m)   Wt (!) 326 lb 9.6 oz (148.1 kg)   SpO2 96%   BMI 51.93 kg/m .  Conjunctivae are clear.  Perioral mucosa is without cyanosis.  Jugular veins are flat.  Respirations are non-labored and lungs are clear to auscultation.  Cardiac exam shows no lifts or thrills.  Rhythm is Irregularly irregular, with no murmurs, rubs or gallops.  Carotid upstrokes are normal without bruits.  Abdomen is without palpable hepatomegaly or tenderness.  The abdominal aorta is without bruits or palpable enlargement.  Gait is labored.  Upper extremity digits show no cyanosis.  There is trace lower extremity edema.  Pedal pulses are 2+ bilaterally.  Skin is warm and dry.    The patient's EKG shows atrial fibrillation with a ventricular response of 118 BPM.  Her QTc interval is 500 ms.    IMPRESSION AND PLAN:  1.  Paroxysmal atrial fibrillation: The patient is back in atrial fibrillation.  She is chronically anticoagulated.  She'll have her pro time checked.  If this is within reasonable limits, she will be scheduled for cardioversion this Thursday.  As of cardioversion have been discussed with the patient.  Risks include: Burns, respiratory insufficiency, dysrhythmia, stroke and death.  The patient voices understanding these risks and consents to the procedure.  I have also discussed the possibility of pulmonary vein isolation in the future for her.  With her QTc interval, I would not increase her dose of sotalol.  2.  Essential hypertension: The patient's blood pressure is adequately controlled.  3.  Hyperlipidemia: The patient is currently not on lipid lowering therapy.    The patient will return for follow-up after her cardioversion.      Electronically signed by Marquis Buggy, MD, [facility]  Please note:  This document was created using speech  recognition technology and may contain wrong word or "sound-alike" substitutions.    cc: Salley Slaughter, [place] [address]  Patient Care Team:  Wiley, Caitlin Norris, MD as PCP - General (Internal Medicine)  Ivan Croft Katherine Basset, MD (Cardiology)  Ronalee Red, RD as Dietitian (Dietitian/Nutritionist)

## 2015-09-23 NOTE — Patient Instructions (Signed)
Schedule cardioversion

## 2015-09-23 NOTE — Progress Notes (Signed)
Continue 7.5 mg X 2 days a week (T,Th) and 5mg  X 5 days and re check INR in 3 weeks.

## 2015-09-23 NOTE — Progress Notes (Signed)
Pt scheduled cardioversion scheduled 09/25/15 1200. [id number], afib i48.0, INR 2.2. Pt counseled re show time and need for driver, etc.

## 2015-09-25 ENCOUNTER — Ambulatory Visit
Admission: RE | Admit: 2015-09-25 | Discharge: 2015-09-25 | Disposition: A | Discharge status: Home / Self Care | Payer: No Typology Code available for payment source | Attending: Internal Medicine | Admitting: Internal Medicine

## 2015-09-25 DIAGNOSIS — Z8249 Family history of ischemic heart disease and other diseases of the circulatory system: Secondary | ICD-10-CM | POA: Insufficient documentation

## 2015-09-25 DIAGNOSIS — E669 Obesity, unspecified: Secondary | ICD-10-CM | POA: Insufficient documentation

## 2015-09-25 DIAGNOSIS — Z7982 Long term (current) use of aspirin: Secondary | ICD-10-CM | POA: Insufficient documentation

## 2015-09-25 DIAGNOSIS — Z7901 Long term (current) use of anticoagulants: Secondary | ICD-10-CM | POA: Insufficient documentation

## 2015-09-25 DIAGNOSIS — Z79899 Other long term (current) drug therapy: Secondary | ICD-10-CM | POA: Insufficient documentation

## 2015-09-25 DIAGNOSIS — E7800 Pure hypercholesterolemia, unspecified: Secondary | ICD-10-CM | POA: Insufficient documentation

## 2015-09-25 DIAGNOSIS — J45909 Unspecified asthma, uncomplicated: Secondary | ICD-10-CM | POA: Insufficient documentation

## 2015-09-25 DIAGNOSIS — I10 Essential (primary) hypertension: Secondary | ICD-10-CM | POA: Insufficient documentation

## 2015-09-25 DIAGNOSIS — Z87891 Personal history of nicotine dependence: Secondary | ICD-10-CM | POA: Insufficient documentation

## 2015-09-25 DIAGNOSIS — Z6841 Body Mass Index (BMI) 40.0 and over, adult: Secondary | ICD-10-CM | POA: Insufficient documentation

## 2015-09-25 DIAGNOSIS — K219 Gastro-esophageal reflux disease without esophagitis: Secondary | ICD-10-CM | POA: Insufficient documentation

## 2015-09-25 DIAGNOSIS — I4891 Unspecified atrial fibrillation: Secondary | ICD-10-CM

## 2015-09-25 DIAGNOSIS — I480 Paroxysmal atrial fibrillation: Secondary | ICD-10-CM | POA: Insufficient documentation

## 2015-09-25 DIAGNOSIS — M329 Systemic lupus erythematosus, unspecified: Secondary | ICD-10-CM | POA: Insufficient documentation

## 2015-09-25 LAB — PARTIAL THROMBOPLASTIN TIME: Partial Thromboplastin Time: 34 s (ref 22–35)

## 2015-09-25 LAB — BASIC METABOLIC PANEL
Anion Gap: 11 (ref 4–12)
Calcium: 9.3 mg/dL (ref 8.9–10.2)
Carbon Dioxide, Total: 23 meq/L (ref 22–32)
Chloride: 105 meq/L (ref 98–108)
Creatinine: 1.1 mg/dL — ABNORMAL HIGH (ref 0.38–1.02)
GFR, Calc, African American: >60 mL/min/{1.73_m2} (ref >59)
GFR, Calc, European American: 50 mL/min/{1.73_m2} — ABNORMAL LOW (ref >59)
Glucose: 129 mg/dL — ABNORMAL HIGH (ref 62–125)
Potassium: 3.5 meq/L — ABNORMAL LOW (ref 3.6–5.2)
Sodium: 139 meq/L (ref 135–145)
Urea Nitrogen: 19 mg/dL (ref 8–21)

## 2015-09-25 LAB — PROTHROMBIN TIME (NWH)
Prothrombin INR: 2.8 — ABNORMAL HIGH (ref 0.8–1.3)
Prothrombin Time Patient, NWH: 28.3 s — ABNORMAL HIGH (ref 8.9–12.1)

## 2015-09-30 ENCOUNTER — Ambulatory Visit: Payer: No Typology Code available for payment source | Admitting: Unknown Physician Specialty

## 2015-09-30 DIAGNOSIS — I480 Paroxysmal atrial fibrillation: Secondary | ICD-10-CM | POA: Insufficient documentation

## 2015-09-30 LAB — PR PROTHROMBIN TIME, ONSITE: prothrombin INR: 3

## 2015-09-30 NOTE — Progress Notes (Signed)
Cardioverted on 09/25/15.  Patient states she feels okay.  Continue 7.5 mg X 2 days a week (T,Th) and 5mg  X 5 days and re check INR in one week when she will f/u with Dr. Ivan Croft.     [place], RN

## 2015-10-09 ENCOUNTER — Ambulatory Visit (HOSPITAL_BASED_OUTPATIENT_CLINIC_OR_DEPARTMENT_OTHER): Payer: No Typology Code available for payment source

## 2015-10-09 ENCOUNTER — Encounter (HOSPITAL_BASED_OUTPATIENT_CLINIC_OR_DEPARTMENT_OTHER): Payer: Self-pay

## 2015-10-09 ENCOUNTER — Ambulatory Visit: Payer: No Typology Code available for payment source

## 2015-10-09 VITALS — BP 104/78 | HR 80 | Wt 326.2 lb

## 2015-10-09 DIAGNOSIS — Z6841 Body Mass Index (BMI) 40.0 and over, adult: Secondary | ICD-10-CM

## 2015-10-09 DIAGNOSIS — I480 Paroxysmal atrial fibrillation: Secondary | ICD-10-CM | POA: Insufficient documentation

## 2015-10-09 LAB — PR PROTHROMBIN TIME, ONSITE: prothrombin INR: 2.8

## 2015-10-09 NOTE — Patient Instructions (Signed)
Try to reduce the weight     I will see you in three months. If you have more afib we will consider increasing the sotalol to 120 mg twice a day.

## 2015-10-09 NOTE — Progress Notes (Signed)
Continue 7.5 mg X 2 days a week (T,Th) and 5mg  X 5 days and re check INR in one month.     Reviewed per MD protocol.    Hayden Rasmussen, RN

## 2015-10-09 NOTE — Progress Notes (Signed)
Chart notes, labs, pharmacy, outside records and medications were obtained and reviewed.

## 2015-10-09 NOTE — Progress Notes (Signed)
Cardiovascular Clinical Evaluation Note    Primary Care Provider: Corey Harold, MD    Referring Provider: No ref. provider found    DOB:  06/24/1950    CHIEF COMPLAINT: Follow-up of episode of atrial fibrillation.  She was seen by Dr. Dorothyann Peng underwent cardioversion and is remained in sinus rhythm.  She notes a couple episodes of brief atrial fibrillation lasted less than a few minutes.  She knows that she is gained weight and was to try to lose the weight.    HISTORY OF PRESENT ILLNESS: At this point there are several options.  One would be to continue the current sotalol dose of 80 mg twice daily as well as anticoagulant therapy and have her work on a weight loss plan.  If this is unsuccessful would consider increasing sotalol dose to 120 mg twice daily in addition to a weight loss program.  The last option would be to consider A. fib ablation therapy although I think medical therapy is still a viable option.    MEDICATIONS:    Current Outpatient Prescriptions   Medication Sig Dispense Refill   . Albuterol Sulfate HFA (VENTOLIN HFA) 108 (90 Base) MCG/ACT Inhalation Aero Soln Inhale 1-2 puffs by mouth 4 times a day as needed for shortness of breath/wheezing. 18 g 2   . Ascorbic Acid (VITAMIN C OR) 500 mg     . ASPIRIN OR 81 mg     . Flaxseed, Linseed, (FLAXSEED OIL) 1200 MG Oral Cap None Entered     . Fluticasone Propionate 50 MCG/ACT Nasal Suspension Spray 2 sprays into each nostril daily. 1 Inhaler 3   . Fluticasone Propionate HFA (FLOVENT HFA) 110 MCG/ACT Inhalation Aerosol Inhale 2 puffs by mouth 2 times a day. - RINSE MOUTH AFTER USE 3 Inhaler 1   . GLUCOSAMINE CHONDROITIN COMPLX OR      . Lansoprazole 30 MG Oral CAPSULE DELAYED RELEASE Take 1 capsule (30 mg) by mouth daily. 90 capsule 1   . Magnesium 400 MG Oral Cap Take 400 mg by mouth.     . MethylPREDNISolone, Pak, 4 MG Oral Tablet Therapy Pack Take by mouth as directed on package labeling for duration of 6 days. 21 tablet 0   . Montelukast Sodium  10 MG Oral Tab Take 1 tablet (10 mg) by mouth daily. 34 tablet 5   . Multiple Vitamins-Minerals (MULTIVITAMIN OR) no iron     . Probiotic Product (PROBIOTIC DAILY OR)      . Sotalol HCl 80 MG Oral Tab Take 1 tablet (80 mg) by mouth 2 times a day. 180 tablet 3   . Triamterene-HCTZ 37.5-25 MG Oral Tab Take 1 tablet by mouth daily. 90 tablet 3   . Warfarin Sodium (COUMADIN) 5 MG Oral Tab Take 7.5mg  X 2 days a week (T,Th) and 5mg  X 5 days a week or as instructed by your physician. 110 tablet 1     No current facility-administered medications for this visit.        ALLERGIES:  Bee venom; Cats [animals]; Ciprofloxacin; Dust mite extract; Pollen extract; and Sulfa antibiotics      PROBLEM LIST:  Patient Active Problem List    Diagnosis Date Noted   . Chronic anticoagulation [Z79.01] 08/02/2015   . Right cervical radiculopathy [M54.12] 05/30/2015   . Obesity [E66.9] 05/30/2015   . Paroxysmal atrial fibrillation ([place]) [I48.0] 04/17/2014   . GERD (gastroesophageal reflux disease) [K21.9] 11/08/2012   . Palpitations [R00.2] 08/21/2012   . OA (osteoarthritis) [M19.90] 07/28/2012   .  Asthma [J45.909] 07/24/2012   . Hypercholesterolemia [E78.00] 07/24/2012   . Hx of colonic polyps--Dr Read--03/21/10  cleared for 5 years [Z86.010] 07/24/2012     Colonoscopy 01/2005     . Edema [R60.9] 07/24/2012   . Sleep apnea [G47.30] 07/24/2012   . HTN (hypertension) [I10] 07/24/2012   . Menopausal state [N95.1] 07/24/2012   . Rash [R21] 07/24/2012   . Pulmonary nodule [R91.1] 07/24/2012   . Grief reaction [F43.20] 07/24/2012     05/11/2011     . Strain of thoracic spine [S29.019A] 07/24/2012       REVIEW OF SYSTEMS   CONSTITUTIONAL:  No fever, weight loss.  EYES:  No visual loss.  ENT:  No hearing loss, exudate.  CARDIOVASCULAR:  See HPI.  GI:  No melena, hematochezia, hematemesis.  SKIN:  No lesions.  GU:  No dysuria, hematuria.  MUSCULOSKELETAL:  No myalgias, no arthralgias.  NEURO:  No TIA or CVA symptoms.  ALLERGIC/IMMUNOLOGIC:  No hives or  rash.  PSYCHIATRIC:  No suicidal ideations.    PHYSICAL EXAM    CONSTITUTIONAL: This is a well-nourished female in no distress.    BP 104/78   Pulse 80   Wt (!) 326 lb 3.2 oz (148 kg)   SpO2 93%   BMI 51.87 kg/m   NEURO/PSYCH:  Oriented x 3 with normal affect.  EYES:  Conjunctivae pink, sclerae clear.  E/N/T:  Oral mucosa pink, dentition normal.  NECK:  Jugular venous pressure normal.  Thyroid is not enlarged.  RESPIRATORY:  Respiratory effort is normal.  Lungs are clear to percussion/auscultation.  CARDIOVASCULAR:  PMI at 5th ICS/MCL.  Regular rhythm.  S1, S2 normal.  No S3, S4, no murmur.  Carotid upstrokes are brisk, without bruits.  Abdominal aorta normal to palpation, no bruit.  Femoral pulses normal, no bruits.  Pedal pulses normal bilaterally.  Extremities no edema.  GASTROINTESTINAL:  Abdomen without masses or tenderness.  No hepatosplenomegaly.  EXTREMITIES:  No clubbing or cyanosis.  MUSCULOSKELETAL:  Normal gait and muscle strength.  SKIN:  Warm and dry, no lesions.    IMPRESSION: Episode of atrial fibrillation now in sinus rhythm after cardioversion.  Continue sotalol at current dose for now and consider increasing to 120 mg twice daily.  We also discussed the possibility of doing atrial fibrillation ablation.        Caitlin Milo, MD [facility]  10/09/2015  3:10 PM    Electronically transcribed by voice recognition technology.  Although every effort was made to ensure accuracy, some transcription errors may occur.    CC:

## 2015-10-27 ENCOUNTER — Telehealth (HOSPITAL_BASED_OUTPATIENT_CLINIC_OR_DEPARTMENT_OTHER): Payer: Self-pay

## 2015-10-27 NOTE — Telephone Encounter (Signed)
Clearance form received, routing to Dr Ivan Croft for consideration.

## 2015-10-27 NOTE — Telephone Encounter (Signed)
endometrial biopsy 10/25 by Dr Burt Ek, Desma Mcgregor RN [phone]. Called MD office to get clearance form faxed to our office.

## 2015-10-29 NOTE — Telephone Encounter (Signed)
Spoke w pt and relayed that coumadin can be held 5 days prior to procedure and restarted at surgeon's rec. Faxed form to vm.

## 2015-11-03 ENCOUNTER — Telehealth (INDEPENDENT_AMBULATORY_CARE_PROVIDER_SITE_OTHER): Payer: Self-pay

## 2015-11-03 DIAGNOSIS — K219 Gastro-esophageal reflux disease without esophagitis: Secondary | ICD-10-CM

## 2015-11-03 MED ORDER — LANSOPRAZOLE 30 MG OR CPDR
30.0000 mg | DELAYED_RELEASE_CAPSULE | Freq: Every day | ORAL | 1 refills | Status: DC
Start: 2015-11-03 — End: 2016-08-20

## 2015-11-03 NOTE — Telephone Encounter (Signed)
Patient calling to request a refill for  Medication: Lansoprazole 30mg   1 daily  Preferred Pharmacy:   [facility] [id number] [place] [phone]  [place] [address]  Phone: [phone] Fax: [phone]    Patient wanting to be notified when done: NO  She is almost out of meds-please call in today or tomorrow

## 2015-11-06 ENCOUNTER — Telehealth (INDEPENDENT_AMBULATORY_CARE_PROVIDER_SITE_OTHER): Payer: Self-pay

## 2015-11-06 ENCOUNTER — Ambulatory Visit: Payer: No Typology Code available for payment source

## 2015-11-06 DIAGNOSIS — I480 Paroxysmal atrial fibrillation: Secondary | ICD-10-CM | POA: Insufficient documentation

## 2015-11-06 LAB — PR PROTHROMBIN TIME, ONSITE: prothrombin INR: 2.5

## 2015-11-06 NOTE — Telephone Encounter (Signed)
[facility] never received the refill auth from 11/03/15 for Lansoprazole. Phoned the refill to [facility] as stated under medications tab. Pt notified it was done.

## 2015-11-06 NOTE — Progress Notes (Signed)
Anticoag Treatment Plan  As of 11/06/2015    INR goal:   2.0-3.0   TTR:   85.7 % (2.2 y)   Today's INR:   2.5   Full instructions:   7.5 mg on Tue, Thu; 5 mg all other days   Weekly total:   40 mg   Next INR check:   12/07/2015   Target end date:            Anticoagulation Episode Summary     INR check location:       Send INR reminders to:   [facility] [place] CLINICAL SUPPORT STAFF POOL    Comments:               SUBJECTIVE    Patient Findings     Negatives:   Signs/symptoms of thrombosis, Signs/symptoms of bleeding, Laboratory test error suspected, Change in health, Change in alcohol use, Change in activity, Upcoming invasive procedure, Emergency department visit, Upcoming dental procedure, Missed doses, Extra doses, Change in medications, Change in diet/appetite, Hospital admission, Bruising, Other complaints          OBJECTIVE    Anticoag Visit Date 11/06/2015 10/09/2015 09/30/2015 09/23/2015 09/08/2015 08/25/2015 08/18/2015   INR Goal 2.0-3.0 2.0-3.0 2.0-3.0 2.0-3.0 2.0-3.0 2.0-3.0 2.0-3.0   Selected INR 2.5 2.8 3.0 2.2 2.7 2.0 1.0   INR Date 11/06/2015 10/09/2015 09/30/2015 09/23/2015 09/08/2015 08/25/2015 08/18/2015   Previous Week Total 40 mg 40 mg 40 mg 40 mg 40 mg 40 mg 40 mg   Pt Deviation No No No No No No No   Pt Findings Comments - - Cardioverted on 09/25/15.   - - - Bruising over left leg/foot improving.   Next Week Total 40 mg 40 mg 40 mg 40 mg 40 mg 40 mg 40 mg   Full Instructions 7.5 mg on Tue, Thu; 5 mg all other days 7.5 mg on Tue, Thu; 5 mg all other days 7.5 mg on Tue, Thu; 5 mg all other days 7.5 mg on Tue, Thu; 5 mg all other days 7.5 mg on Tue, Thu; 5 mg all other days 7.5 mg on Tue, Thu; 5 mg all other days 7.5 mg on Tue, Thu; 5 mg all other days   Date of Next INR 12/07/2015 11/08/2015 10/09/2015 10/01/2015 09/29/2015 09/08/2015 08/25/2015       ASSESSMENT    Therapeutic INR in stable patient and without complications.     PLAN    1.  Continue Warfarin  .  As of 11/06/2015    Maintenance plan:   7.5 mg on Tue, Thu; 5 mg  all other days   Full instructions:   7.5 mg on Tue, Thu; 5 mg all other days   Weekly total:   40 mg   Next INR check:   12/07/2015         Anticoagulation Episode Summary     Comments:             2. Jeneanne Lozzi acknowledged understanding of this plan      PATIENT INSTRUCTIONS    October 2017 Details    Sun Molli Knock Tue Wed Thu Fri Sat     1               2               3                4  5               6               7                 8               9               10               11               12               13               14                 15               16               17               18               19       7.5 mg   See details      20      5 mg         21      5 mg           22      5 mg         23      5 mg         24      7.5 mg         25      5 mg         26      7.5 mg         27      5 mg         28      5 mg           29      5 mg         30      5 mg         31      7.5 mg              Date Details   10/19 This INR check                 November 2017 Details    Sun Mon Tue Wed Thu Fri Sat        1      5 mg         2      7.5 mg         3      5 mg         4      5 mg           5      5 mg         6      5 mg         7      7.5 mg         8      5 mg  9      7.5 mg         10      5 mg         11      5 mg           12      5 mg         13      5 mg         14      7.5 mg         15      5 mg         16      7.5 mg         17      5 mg         18      5 mg           19      5 mg         20               21               22               23               24               25                 26               27               28               29               30                   Date Details   No additional details    Date of next INR:  12/07/2015

## 2015-11-08 ENCOUNTER — Encounter (INDEPENDENT_AMBULATORY_CARE_PROVIDER_SITE_OTHER): Payer: Self-pay

## 2015-11-10 NOTE — Telephone Encounter (Signed)
Entered in record

## 2015-11-13 ENCOUNTER — Ambulatory Visit: Payer: No Typology Code available for payment source

## 2015-11-13 DIAGNOSIS — I480 Paroxysmal atrial fibrillation: Secondary | ICD-10-CM | POA: Insufficient documentation

## 2015-11-13 LAB — PR PROTHROMBIN TIME, ONSITE: prothrombin INR: 2.2

## 2015-11-13 NOTE — Progress Notes (Signed)
Anticoag Treatment Plan  As of 11/13/2015    INR goal:   2.0-3.0   TTR:   85.9 % (2.2 y)   Today's INR:   2.2   Full instructions:   7.5 mg on Tue, Thu; 5 mg all other days   Weekly total:   40 mg   Next INR check:   12/14/2015   Target end date:            Anticoagulation Episode Summary     INR check location:       Send INR reminders to:   [facility] [place] CLINICAL SUPPORT STAFF POOL    Comments:               SUBJECTIVE    Patient Findings     Negatives:   Signs/symptoms of thrombosis, Signs/symptoms of bleeding, Laboratory test error suspected, Change in health, Change in alcohol use, Change in activity, Upcoming invasive procedure, Emergency department visit, Upcoming dental procedure, Missed doses, Extra doses, Change in medications, Change in diet/appetite, Hospital admission, Bruising, Other complaints          OBJECTIVE    Anticoag Visit Date 11/13/2015 11/06/2015 10/09/2015 09/30/2015 09/23/2015 09/08/2015 08/25/2015   INR Goal 2.0-3.0 2.0-3.0 2.0-3.0 2.0-3.0 2.0-3.0 2.0-3.0 2.0-3.0   Selected INR 2.2 2.5 2.8 3.0 2.2 2.7 2.0   INR Date 11/13/2015 11/06/2015 10/09/2015 09/30/2015 09/23/2015 09/08/2015 08/25/2015   Previous Week Total 40 mg 40 mg 40 mg 40 mg 40 mg 40 mg 40 mg   Pt Deviation No No No No No No No   Pt Findings Comments - - - Cardioverted on 09/25/15.   - - -   Next Week Total 40 mg 40 mg 40 mg 40 mg 40 mg 40 mg 40 mg   Full Instructions 7.5 mg on Tue, Thu; 5 mg all other days 7.5 mg on Tue, Thu; 5 mg all other days 7.5 mg on Tue, Thu; 5 mg all other days 7.5 mg on Tue, Thu; 5 mg all other days 7.5 mg on Tue, Thu; 5 mg all other days 7.5 mg on Tue, Thu; 5 mg all other days 7.5 mg on Tue, Thu; 5 mg all other days   Date of Next INR 12/14/2015 12/07/2015 11/08/2015 10/09/2015 10/01/2015 09/29/2015 09/08/2015       ASSESSMENT    Therapeutic INR in stable patient and without complications.     PLAN    1.  Continue Warfarin  .  As of 11/13/2015    Maintenance plan:   7.5 mg on Tue, Thu; 5 mg all other days   Full  instructions:   7.5 mg on Tue, Thu; 5 mg all other days   Weekly total:   40 mg   Next INR check:   12/14/2015         Anticoagulation Episode Summary     Comments:             2. Caitlin Wiley acknowledged understanding of this plan      PATIENT INSTRUCTIONS    October 2017 Details    Sun Glori Luis Thu Fri Sat     1               2               3                4  5               6               7                 8               9               10               11               12               13               14                 15               16               17               18               19               20               21                 22               23               24               25               26       7.5 mg   See details      27      5 mg         28      5 mg           29      5 mg         30      5 mg         31      7.5 mg              Date Details   10/26 This INR check                 November 2017 Details    Sun Mon Tue Wed Thu Fri Sat        1      5 mg         2      7.5 mg         3      5 mg         4      5 mg           5      5 mg         6      5 mg         7      7.5 mg         8      5 mg         9  7.5 mg         10      5 mg         11      5 mg           12      5 mg         13      5 mg         14      7.5 mg         15      5 mg         16      7.5 mg         17      5 mg         18      5 mg           19      5 mg         20      5 mg         21      7.5 mg         22      5 mg         23      7.5 mg         24      5 mg         25      5 mg           26      5 mg         27               28               29               30                   Date Details   No additional details    Date of next INR:  12/14/2015               Reviewed per MD protocol.    Hayden Rasmussen, RN

## 2015-11-19 ENCOUNTER — Other Ambulatory Visit (HOSPITAL_BASED_OUTPATIENT_CLINIC_OR_DEPARTMENT_OTHER): Payer: Self-pay

## 2015-11-19 ENCOUNTER — Other Ambulatory Visit (INDEPENDENT_AMBULATORY_CARE_PROVIDER_SITE_OTHER): Payer: Self-pay | Admitting: Internal Medicine

## 2015-11-19 DIAGNOSIS — I480 Paroxysmal atrial fibrillation: Secondary | ICD-10-CM

## 2015-11-19 DIAGNOSIS — R0602 Shortness of breath: Secondary | ICD-10-CM

## 2015-11-19 MED ORDER — ALBUTEROL SULFATE HFA 108 (90 BASE) MCG/ACT IN AERS
1.0000 | INHALATION_SPRAY | Freq: Four times a day (QID) | RESPIRATORY_TRACT | 2 refills | Status: DC | PRN
Start: 2015-11-19 — End: 2016-02-04

## 2015-11-20 MED ORDER — WARFARIN SODIUM 5 MG OR TABS
ORAL_TABLET | ORAL | 0 refills | Status: DC
Start: 2015-11-20 — End: 2016-03-09

## 2015-11-27 ENCOUNTER — Telehealth (HOSPITAL_BASED_OUTPATIENT_CLINIC_OR_DEPARTMENT_OTHER): Payer: Self-pay

## 2015-11-27 NOTE — Telephone Encounter (Signed)
Patient is going to have a hysteroscopy and going to have a pylop removed by Dr. Junius Argyle. Patient is wondering if she can come ff coumadin 5 to 7 days prior.       Routing to Dr. Ivan Croft for recommendations.

## 2015-11-27 NOTE — Telephone Encounter (Signed)
Pt called stating that she is scheduled to have a procedure scheduled on 12/23/2015 and was recommended to call our clinic to discuss her coumadin management.    Routing to [place] ADVICE/REQUEST.    Thank you     Fransico Michael  [facility] MEDICINE  [facility]

## 2015-12-01 ENCOUNTER — Ambulatory Visit (HOSPITAL_BASED_OUTPATIENT_CLINIC_OR_DEPARTMENT_OTHER): Payer: No Typology Code available for payment source

## 2015-12-01 ENCOUNTER — Ambulatory Visit: Payer: No Typology Code available for payment source | Attending: Internal Medicine | Admitting: Internal Medicine

## 2015-12-01 ENCOUNTER — Encounter (HOSPITAL_BASED_OUTPATIENT_CLINIC_OR_DEPARTMENT_OTHER): Payer: Self-pay | Admitting: Internal Medicine

## 2015-12-01 VITALS — BP 90/62 | HR 54 | Ht 66.5 in | Wt 319.6 lb

## 2015-12-01 DIAGNOSIS — Z6841 Body Mass Index (BMI) 40.0 and over, adult: Secondary | ICD-10-CM

## 2015-12-01 DIAGNOSIS — R002 Palpitations: Secondary | ICD-10-CM | POA: Insufficient documentation

## 2015-12-01 DIAGNOSIS — I480 Paroxysmal atrial fibrillation: Secondary | ICD-10-CM | POA: Insufficient documentation

## 2015-12-01 DIAGNOSIS — I4891 Unspecified atrial fibrillation: Secondary | ICD-10-CM

## 2015-12-01 DIAGNOSIS — R9431 Abnormal electrocardiogram [ECG] [EKG]: Secondary | ICD-10-CM

## 2015-12-01 DIAGNOSIS — E7800 Pure hypercholesterolemia, unspecified: Secondary | ICD-10-CM | POA: Insufficient documentation

## 2015-12-01 DIAGNOSIS — I10 Essential (primary) hypertension: Secondary | ICD-10-CM | POA: Insufficient documentation

## 2015-12-01 LAB — PR PROTHROMBIN TIME, ONSITE: prothrombin INR: 2.6

## 2015-12-01 NOTE — Progress Notes (Signed)
Anticoag Treatment Plan  As of 12/01/2015    INR goal:   2.0-3.0   TTR:   86.2 % (2.2 y)   Today's INR:   2.6   Full instructions:   7.5 mg on Tue, Thu; 5 mg all other days   Weekly total:   40 mg   Next INR check:   12/14/2015   Target end date:            Anticoagulation Episode Summary     INR check location:       Send INR reminders to:   [facility] EDM CLINICAL SUPPORT STAFF POOL    Comments:               SUBJECTIVE    Patient Findings     Positives:   Other complaints    Negatives:   Signs/symptoms of thrombosis, Signs/symptoms of bleeding, Laboratory test error suspected, Change in health, Change in alcohol use, Change in activity, Upcoming invasive procedure, Emergency department visit, Upcoming dental procedure, Missed doses, Extra doses, Change in medications, Change in diet/appetite, Hospital admission, Bruising    Comments:   Patient is back in AF - cardioversion scheduled.           OBJECTIVE    Anticoag Visit Date 12/01/2015 11/13/2015 11/06/2015 10/09/2015 09/30/2015 09/23/2015 09/08/2015   INR Goal 2.0-3.0 2.0-3.0 2.0-3.0 2.0-3.0 2.0-3.0 2.0-3.0 2.0-3.0   Selected INR 2.6 2.2 2.5 2.8 3.0 2.2 2.7   INR Date 12/01/2015 11/13/2015 11/06/2015 10/09/2015 09/30/2015 09/23/2015 09/08/2015   Previous Week Total 40 mg 40 mg 40 mg 40 mg 40 mg 40 mg 40 mg   Pt Deviation No No No No No No No   Pt Findings Comments Patient is back in AF - cardioversion scheduled.  - - - Cardioverted on 09/25/15.   - -   Next Week Total 40 mg 40 mg 40 mg 40 mg 40 mg 40 mg 40 mg   Full Instructions 7.5 mg on Tue, Thu; 5 mg all other days 7.5 mg on Tue, Thu; 5 mg all other days 7.5 mg on Tue, Thu; 5 mg all other days 7.5 mg on Tue, Thu; 5 mg all other days 7.5 mg on Tue, Thu; 5 mg all other days 7.5 mg on Tue, Thu; 5 mg all other days 7.5 mg on Tue, Thu; 5 mg all other days   Date of Next INR - 12/14/2015 12/07/2015 11/08/2015 10/09/2015 10/01/2015 09/29/2015       ASSESSMENT    Therapeutic INR in stable patient and without complications.      PLAN    1.  Continue Warfarin  .  As of 12/01/2015    Maintenance plan:   7.5 mg on Tue, Thu; 5 mg all other days   Full instructions:   7.5 mg on Tue, Thu; 5 mg all other days   Weekly total:   40 mg   Next INR check:   12/14/2015         Anticoagulation Episode Summary     Comments:             2. Caitlin Wiley acknowledged understanding of this plan      PATIENT INSTRUCTIONS    November 2017 Details    Sun Glori Luis Thu Fri Sat        1               2  3               4                 5               6               7               8               9               10               11                 12               13      5  mg   See details      14      7.5 mg         15      5 mg         16      7.5 mg         17      5 mg         18      5 mg           19      5 mg         20      5 mg         21      7.5 mg         22      5 mg         23      7.5 mg         24      5 mg         25      5 mg           26      5 mg         27      5 mg         28      7.5 mg         29      5 mg         30      7.5 mg            Date Details   11/13 This INR check      Date of next INR: No date specified             Reviewed per MD protocol.    Hayden Rasmussen, RN

## 2015-12-01 NOTE — Progress Notes (Signed)
DATE:  12/01/2015    NAME: [name]  DOB:  [birth year]    Referring Provider:  No ref. provider found  Primary Care Provider:  Hagedorn, Ashok Norris, MD    Ennifer Tellier is an 65 year old female who comes in for follow-up of paroxysmal atrial fibrillation.  I was on call this weekend and the patient contacted me to admit that she's been back in atrial fibrillation since Saturday morning.  She now presents for follow-up.  She feels a bit more fatigued in atrial fibrillation and less able to do her daily activities.  She denies pain, pressure or heaviness in her chest.  She does note some increased dyspnea on exertion.  She denies paroxysmal nocturnal dyspnea or orthopnea.  She states her blood pressure is low here today, but has been normal when she checks it at home.  She is tolerating her current medical regimen well.        PROBLEM LIST:    Patient Active Problem List    Diagnosis Date Noted   . Chronic anticoagulation 08/02/2015   . Right cervical radiculopathy 05/30/2015   . Obesity 05/30/2015   . Paroxysmal atrial fibrillation ([place]) 04/17/2014   . GERD (gastroesophageal reflux disease) 11/08/2012   . Palpitations 08/21/2012   . OA (osteoarthritis) 07/28/2012   . Asthma 07/24/2012   . Hypercholesterolemia 07/24/2012   . Hx of colonic polyps--Dr Read--03/21/10  cleared for 5 years 07/24/2012     Colonoscopy 01/2005     . Edema 07/24/2012   . Sleep apnea 07/24/2012   . HTN (hypertension) 07/24/2012   . Menopausal state 07/24/2012   . Rash 07/24/2012   . Pulmonary nodule 07/24/2012   . Grief reaction 07/24/2012     05/11/2011     . Strain of thoracic spine 07/24/2012         The patient has a past medical history of Anxiety; Cervical disc disease; GERD (gastroesophageal reflux disease); Leukocytosis; Muscle cramps (04/2011); Osteoarthritis; and Pain of toe of right foot (02/2010).  Past Surgical History:   Procedure Laterality Date   . COLONOSCOPY FLX DX W/COLLJ SPEC WHEN PFRMD  11/2005   . SURGICAL HX  OTHER  08/1999    knee surgery       MEDICATION ALLERGIES AND INTOLERANCES:  Bee venom; Cats [animals]; Ciprofloxacin; Dust mite extract; Pollen extract; and Sulfa antibiotics    MEDICATIONS:    Current Outpatient Prescriptions   Medication Sig Dispense Refill   . Albuterol Sulfate HFA (VENTOLIN HFA) 108 (90 Base) MCG/ACT Inhalation Aero Soln Inhale 1-2 puffs by mouth 4 times a day as needed for shortness of breath/wheezing. 18 g 2   . Ascorbic Acid (VITAMIN C OR) 500 mg     . ASPIRIN OR 81 mg     . Flaxseed, Linseed, (FLAXSEED OIL) 1200 MG Oral Cap None Entered     . Fluticasone Propionate 50 MCG/ACT Nasal Suspension Spray 2 sprays into each nostril daily. 1 Inhaler 3   . Fluticasone Propionate HFA (FLOVENT HFA) 110 MCG/ACT Inhalation Aerosol Inhale 2 puffs by mouth 2 times a day. - RINSE MOUTH AFTER USE 3 Inhaler 1   . GLUCOSAMINE CHONDROITIN COMPLX OR      . Lansoprazole 30 MG Oral CAPSULE DELAYED RELEASE Take 1 capsule (30 mg) by mouth daily. 90 capsule 1   . Magnesium 400 MG Oral Cap Take 400 mg by mouth.     . Montelukast Sodium 10 MG Oral Tab Take 1 tablet (10 mg)  by mouth daily. 34 tablet 5   . Multiple Vitamins-Minerals (MULTIVITAMIN OR) no iron     . Probiotic Product (PROBIOTIC DAILY OR)      . Sotalol HCl 80 MG Oral Tab Take 1 tablet (80 mg) by mouth 2 times a day. 180 tablet 3   . Triamterene-HCTZ 37.5-25 MG Oral Tab Take 1 tablet by mouth daily. 90 tablet 3   . Warfarin Sodium (COUMADIN) 5 MG Oral Tab Take 7.5mg  X 2 days a week (T,Th) and 5mg  X 5 days a week or as instructed by your physician. 110 tablet 0     No current facility-administered medications for this visit.      SOCIAL HISTORY:    Social History     Social History   . Marital status: N/A     Spouse name: N/A   . Number of children: 0   . Years of education: N/A     Occupational History   . Not on file.     Social History Main Topics   . Smoking status: Never Smoker   . Smokeless tobacco: Never Used   . Alcohol use 0.0 oz/week   . Drug use:  No   . Sexual activity: No     Other Topics Concern   . 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 Rare exercise.         DXA completed on 07/31/12, resulting in normal bone density of the lumbar spine, and normal bone density at the L hip; ordered 04/30/15        Mammogram performed on 05/27/14, resulting in no mammographic features of malignancy         Colonoscopy performed on 04/03/14 with Dr. Estill Bamberg, resulting in moderate diverticulosis of the left side of the colon, long redundant colon, cleared for seven years         TDAP- 03/13/10    Zostavax-advised 04/30/15        PAP-monitored by Dr. Harmon Pier        HIV screening, Hepatitis C screening-done in 2014       FAMILY HISTORY:    Family History     Problem (# of Occurrences) Relation (Name,Age of Onset)    Anesthesia Problems (1) Mother    Arthritis (3) Mother, Father, Other: grandmother    Asthma (1) Other: and/or allergies - grandfather    Breast Cancer (2) Mother: & tongue cancer, Other: grandmother    Cancer (2) Father, Other: grandparents    Colon Polyps (1) Father    Diabetes (1) Father    Heart Disease (3) Father: torn valve 01/1999, Maternal Grandfather, Paternal Grandfather    Hypertension (2) Mother, Father    Lupus (1) Aunt/Uncle: great-aunt    Mental Illness (2) Mother, Brother    Stroke (2) Mother: possible, Father    Tuberculosis (1) Other: great-grandmother          REVIEW OF SYSTEMS:  Constitutional: No fevers, weight loss.  Eyes: No visual loss.  ENT: No hearing loss, exudate.  Cardiovascular: See history of present illness.  Gastrointestinal: No melena, hematochezia or hematemesis.  Skin: No lesions.  Genitourinary: No dysuria, hematuria.  Musculoskeletal: No myalgias or arthralgias.  Neurological: No TIA or CVA symptoms.  Allergy/immunologic: No hives or rash.  Psychiatric: No suicidal ideation.      PHYSICAL EXAMINATION reveals a well-appearing female, who is oriented and expresses  a  normal affect.  Vitals:  BP 90/62   Pulse 54   Ht 5' 6.5" (1.689 m)   Wt (!) 319 lb 9.6 oz (145 kg)   SpO2 95%   BMI 50.82 kg/m .  Conjunctivae are clear.  Perioral mucosa is without cyanosis.  Jugular veins are flat.  Respirations are non-labored and lungs are clear to auscultation.  Cardiac exam shows no lifts or thrills.  Rhythm is Irregularly irregular, with no murmurs, rubs or gallops.  Carotid upstrokes are normal without bruits.  Abdomen is without palpable hepatomegaly or tenderness.  The abdominal aorta is without bruits or palpable enlargement.  Gait is independent.  Upper extremity digits show no cyanosis.  There is no lower extremity edema.  Pedal pulses are 2+ bilaterally.  Skin is warm and dry.    The patient's EKG shows atrial fibrillation with a ventricular response of 117 BPM.  Her QTc interval is 510 ms    IMPRESSION AND PLAN:  1.  Paroxysmal atrial fibrillation: The patient is back in sinus.  She is chronically anticoagulated.  Her INR is within normal limits.  I have discussed the possibility of cardioversion with the patient.  Risks of the procedure include respiratory failure, bradycardia, dysrhythmia and burns.  The patient voices understanding of these risks and consents to the procedure.  The patient's insurance necessitates of the procedure been done at [facility].  I will schedule this for Thursday.  With the patient's elevated QTc interval, I would not recommend increasing her dose of sotalol.  I have also discussed the possibility of pulmonary vein isolation and have recommended the patient make an appointment with Dr. Rene Paci to discuss this further.  2.  Essential hypertension: The patient's blood pressure is low here.  She says it has been within normal limits when she checks it at home.  I would not recommend any changes.      Electronically signed by Marquis Buggy, MD, [facility]  Please note:  This document was created using speech recognition technology and may contain  wrong word or "sound-alike" substitutions.    cc: Salley [place], [place] [address]  Patient Care Team:  Hagedorn, Ashok Norris, MD as PCP - General (Internal Medicine)  Ivan Croft Katherine Basset, MD (Cardiology)  Ronalee Red, RD as Dietitian (Dietitian/Nutritionist)

## 2015-12-01 NOTE — Progress Notes (Signed)
Instructions for Procedures     PROCEDURE: Cardioversion   DATE: Thursday November 16th   CHECK IN TIME: 11:00 am     Check in at the [facility] (which is located on the 1st floor of [facility], across from the [place] side main entrance and [place]) front desk, your procedure will begin approximately 1.5 to 2 hours after this time.     FOOD: Nothing to Eat or Drink after midnight     MEDICATIONS: May take medication with a small sip of water before leaving for the hospital or wait until after procedure. Please continue coumadin. INR will be checked the day of the procedure and must be >2.0.      BLOOD WORK: Will be done on the day of the procedure if needed.          POST-PROCEDURE INSTRUCTIONS:      DRIVING: Please arrange for transportation both to and from procedure.     ACTIVITY RESTRICTIONS: No strenuous activity for 5 days, including lifting of more than 5 lbs. (The only exception is if only a TEE is done.)      FOLLOW UP APPOINTMENTS:    Please schedule a follow up appointment with your doctor 7 to 10 days after your procedure date.    If you have any questions, please call:    [facility] at [phone].

## 2015-12-01 NOTE — Progress Notes (Signed)
EKG performed.

## 2015-12-01 NOTE — Patient Instructions (Signed)
Schedule cardioversion

## 2015-12-02 NOTE — Progress Notes (Signed)
Procedure canceled as patient called and is back in NSR.

## 2015-12-03 NOTE — Telephone Encounter (Signed)
Relayed Dr. Ivan Croft message that it is okay to be off coumadin for procedure.

## 2015-12-10 ENCOUNTER — Encounter (HOSPITAL_BASED_OUTPATIENT_CLINIC_OR_DEPARTMENT_OTHER): Payer: Self-pay

## 2015-12-10 ENCOUNTER — Encounter (HOSPITAL_BASED_OUTPATIENT_CLINIC_OR_DEPARTMENT_OTHER): Payer: No Typology Code available for payment source

## 2015-12-15 ENCOUNTER — Ambulatory Visit: Payer: No Typology Code available for payment source

## 2015-12-15 ENCOUNTER — Encounter (HOSPITAL_BASED_OUTPATIENT_CLINIC_OR_DEPARTMENT_OTHER): Payer: No Typology Code available for payment source

## 2015-12-15 DIAGNOSIS — I480 Paroxysmal atrial fibrillation: Secondary | ICD-10-CM | POA: Insufficient documentation

## 2015-12-15 LAB — PR PROTHROMBIN TIME, ONSITE: prothrombin INR: 3.7

## 2015-12-15 NOTE — Progress Notes (Signed)
Anticoag Treatment Plan  As of 12/15/2015    INR goal:   2.0-3.0   TTR:   85.3 % (2.3 y)   Today's INR:   3.7!   Full instructions:   7.5 mg on Thu; 5 mg all other days   Weekly total:   37.5 mg   Next INR check:   12/14/2015   Target end date:            Anticoagulation Episode Summary     INR check location:       Send INR reminders to:   [facility] EDM CLINICAL SUPPORT STAFF POOL    Comments:               SUBJECTIVE    Patient Findings     Positives:   Change in medications, Change in diet/appetite    Comments:   Prevacid dc'd. Eating much less greens          OBJECTIVE    Anticoag Visit Date 12/15/2015 12/01/2015 11/13/2015 11/06/2015 10/09/2015 09/30/2015 09/23/2015   INR Goal 2.0-3.0 2.0-3.0 2.0-3.0 2.0-3.0 2.0-3.0 2.0-3.0 2.0-3.0   Selected INR 3.7 2.6 2.2 2.5 2.8 3.0 2.2   INR Date 12/15/2015 12/01/2015 11/13/2015 11/06/2015 10/09/2015 09/30/2015 09/23/2015   Previous Week Total 40 mg 40 mg 40 mg 40 mg 40 mg 40 mg 40 mg   Pt Deviation No No No No No No No   Pt Findings Comments Prevacid dc'd. Eating much less greens Patient is back in AF - cardioversion scheduled.  - - - Cardioverted on 09/25/15.   -   Next Week Total 37.5 mg 40 mg 40 mg 40 mg 40 mg 40 mg 40 mg   Full Instructions 7.5 mg on Thu; 5 mg all other days 7.5 mg on Tue, Thu; 5 mg all other days 7.5 mg on Tue, Thu; 5 mg all other days 7.5 mg on Tue, Thu; 5 mg all other days 7.5 mg on Tue, Thu; 5 mg all other days 7.5 mg on Tue, Thu; 5 mg all other days 7.5 mg on Tue, Thu; 5 mg all other days   Date of Next INR - - 12/14/2015 12/07/2015 11/08/2015 10/09/2015 10/01/2015       ASSESSMENT    Elevated INR in association with less greens and dc'd Prevacid and without complications.     PLAN    1.   Hold X 1 day (11/27) then decrease by 2.5 mg weekly so 7.5 mg X 1 day (Th) and 5mg  X 6 days and re check INR in one week. Eating less greens and no longer taking Prevacid.     .  As of 12/15/2015    Maintenance plan:   7.5 mg on Thu; 5 mg all other days   Full instructions:    7.5 mg on Thu; 5 mg all other days   Weekly total:   37.5 mg   Next INR check:   12/14/2015         Anticoagulation Episode Summary     Comments:             2. Caitlin Wiley acknowledged understanding of this plan      PATIENT INSTRUCTIONS    November 2017 Details    Sun Mon Tue Wed Thu Fri Sat        1               2  3               4                 5               6               7               8               9               10               11                 12               13               14               15               16               17               18                 19               20               21               22               23               24               25                 26               27      5  mg   See details      28      5 mg         29      5 mg         30      7.5 mg            Date Details   11/27 This INR check      Date of next INR: No date specified

## 2015-12-22 ENCOUNTER — Ambulatory Visit: Payer: No Typology Code available for payment source

## 2015-12-22 DIAGNOSIS — I480 Paroxysmal atrial fibrillation: Secondary | ICD-10-CM | POA: Insufficient documentation

## 2015-12-22 LAB — PR PROTHROMBIN TIME, ONSITE: prothrombin INR: 2.2

## 2015-12-22 NOTE — Progress Notes (Signed)
Anticoag Treatment Plan  As of 12/22/2015    INR goal:   2.0-3.0   TTR:   85.1 % (2.3 y)   Today's INR:   2.2   Full instructions:   7.5 mg on Thu; 5 mg all other days   Weekly total:   37.5 mg   Next INR check:   01/05/2016   Target end date:            Anticoagulation Episode Summary     INR check location:       Send INR reminders to:   [facility] EDM CLINICAL SUPPORT STAFF POOL    Comments:               SUBJECTIVE    Patient Findings     Negatives:   Signs/symptoms of thrombosis, Signs/symptoms of bleeding, Laboratory test error suspected, Change in health, Change in alcohol use, Change in activity, Upcoming invasive procedure, Emergency department visit, Upcoming dental procedure, Missed doses, Extra doses, Change in medications, Change in diet/appetite, Hospital admission, Bruising, Other complaints          OBJECTIVE    Anticoag Visit Date 12/22/2015 12/15/2015 12/01/2015 11/13/2015 11/06/2015 10/09/2015 09/30/2015   INR Goal 2.0-3.0 2.0-3.0 2.0-3.0 2.0-3.0 2.0-3.0 2.0-3.0 2.0-3.0   Selected INR 2.2 3.7 2.6 2.2 2.5 2.8 3.0   INR Date 12/22/2015 12/15/2015 12/01/2015 11/13/2015 11/06/2015 10/09/2015 09/30/2015   Previous Week Total 37.5 mg 40 mg 40 mg 40 mg 40 mg 40 mg 40 mg   Pt Deviation No No No No No No No   Pt Findings Comments - Prevacid dc'd. Eating much less greens Patient is back in AF - cardioversion scheduled.  - - - Cardioverted on 09/25/15.     Next Week Total 37.5 mg 37.5 mg 40 mg 40 mg 40 mg 40 mg 40 mg   Full Instructions 7.5 mg on Thu; 5 mg all other days 7.5 mg on Thu; 5 mg all other days 7.5 mg on Tue, Thu; 5 mg all other days 7.5 mg on Tue, Thu; 5 mg all other days 7.5 mg on Tue, Thu; 5 mg all other days 7.5 mg on Tue, Thu; 5 mg all other days 7.5 mg on Tue, Thu; 5 mg all other days   Date of Next INR 01/05/2016 - - 12/14/2015 12/07/2015 11/08/2015 10/09/2015       ASSESSMENT    Therapeutic INR in stable patient and without complications.     PLAN    1.  Continue Warfarin  .  As of 12/22/2015    Maintenance plan:   7.5 mg on Thu; 5 mg all other days   Full instructions:   7.5 mg on Thu; 5 mg all other days   Weekly total:   37.5 mg   No change documented:   Ardelle Park, RN   Next INR check:   01/05/2016         Anticoagulation Episode Summary     Comments:             2. [name] acknowledged understanding of this plan  2.5 mg weekly so 7.5 mg X 1 day (Th) and 5mg  X 6 days and re check INR in 2 weeks.      PATIENT INSTRUCTIONS    December 2017 Details    Sun Glori Luis Thu Fri Sat          1  2                 3               4      5  mg   See details      5      5 mg         6      5 mg         7      7.5 mg         8      5 mg         9      5 mg           10      5 mg         11      5 mg         12      5 mg         13      5 mg         14      7.5 mg         15      5 mg         16      5 mg           17      5 mg         18      5 mg         19               20               21               22               23                 24               25               26               27               28               29               30                 31                       Date Details   12/04 This INR check       Date of next INR:  01/05/2016

## 2016-01-06 ENCOUNTER — Ambulatory Visit: Payer: No Typology Code available for payment source

## 2016-01-06 ENCOUNTER — Ambulatory Visit (INDEPENDENT_AMBULATORY_CARE_PROVIDER_SITE_OTHER): Payer: No Typology Code available for payment source

## 2016-01-06 ENCOUNTER — Encounter (INDEPENDENT_AMBULATORY_CARE_PROVIDER_SITE_OTHER): Payer: Self-pay

## 2016-01-06 VITALS — BP 110/90 | HR 65 | Ht 66.5 in | Wt 317.0 lb

## 2016-01-06 DIAGNOSIS — J4520 Mild intermittent asthma, uncomplicated: Secondary | ICD-10-CM

## 2016-01-06 DIAGNOSIS — I480 Paroxysmal atrial fibrillation: Secondary | ICD-10-CM

## 2016-01-06 DIAGNOSIS — I10 Essential (primary) hypertension: Secondary | ICD-10-CM

## 2016-01-06 DIAGNOSIS — E7800 Pure hypercholesterolemia, unspecified: Secondary | ICD-10-CM | POA: Insufficient documentation

## 2016-01-06 DIAGNOSIS — S29019A Strain of muscle and tendon of unspecified wall of thorax, initial encounter: Secondary | ICD-10-CM

## 2016-01-06 DIAGNOSIS — Z6841 Body Mass Index (BMI) 40.0 and over, adult: Secondary | ICD-10-CM

## 2016-01-06 DIAGNOSIS — G5622 Lesion of ulnar nerve, left upper limb: Secondary | ICD-10-CM

## 2016-01-06 DIAGNOSIS — K219 Gastro-esophageal reflux disease without esophagitis: Secondary | ICD-10-CM

## 2016-01-06 LAB — CBC, DIFF
% Basophils: 1 %
% Eosinophils: 3 %
% Immature Granulocytes: 1 %
% Lymphocytes: 20 %
% Monocytes: 6 %
% Neutrophils: 69 %
% Nucleated RBC: 0 %
Absolute Eosinophil Count: 0.26 10*3/uL (ref 0.00–0.50)
Absolute Lymphocyte Count: 2.04 10*3/uL (ref 1.00–4.80)
Basophils: 0.08 10*3/uL (ref 0.00–0.20)
Hematocrit: 47 % — ABNORMAL HIGH (ref 36–45)
Hemoglobin: 15.3 g/dL (ref 11.5–15.5)
Immature Granulocytes: 0.05 10*3/uL (ref 0.00–0.05)
MCH: 27.1 pg — ABNORMAL LOW (ref 27.3–33.6)
MCHC: 32.6 g/dL (ref 32.2–36.5)
MCV: 83 fL (ref 81–98)
Monocytes: 0.62 10*3/uL (ref 0.00–0.80)
Neutrophils: 7.43 10*3/uL — ABNORMAL HIGH (ref 1.80–7.00)
Nucleated RBC: 0 10*3/uL
Platelet Count: 349 10*3/uL (ref 150–400)
RBC: 5.64 10*6/uL — ABNORMAL HIGH (ref 3.80–5.00)
RDW-CV: 16.2 % — ABNORMAL HIGH (ref 11.6–14.4)
WBC: 10.48 10*3/uL — ABNORMAL HIGH (ref 4.3–10.0)

## 2016-01-06 LAB — COMPREHENSIVE METABOLIC PANEL
ALT (GPT): 23 U/L (ref 7–33)
AST (GOT): 25 U/L (ref 9–38)
Albumin: 4.1 g/dL (ref 3.5–5.2)
Alkaline Phosphatase (Total): 93 U/L (ref 31–132)
Anion Gap: 9 (ref 4–12)
Bilirubin (Total): 0.8 mg/dL (ref 0.2–1.3)
Calcium: 9.4 mg/dL (ref 8.9–10.2)
Carbon Dioxide, Total: 27 meq/L (ref 22–32)
Chloride: 100 meq/L (ref 98–108)
Creatinine: 1.03 mg/dL — ABNORMAL HIGH (ref 0.38–1.02)
GFR, Calc, African American: >60 mL/min/{1.73_m2} (ref >59)
GFR, Calc, European American: 54 mL/min/{1.73_m2} — ABNORMAL LOW (ref >59)
Glucose: 107 mg/dL (ref 62–125)
Potassium: 3.9 meq/L (ref 3.6–5.2)
Protein (Total): 6.9 g/dL (ref 6.0–8.2)
Sodium: 136 meq/L (ref 135–145)
Urea Nitrogen: 15 mg/dL (ref 8–21)

## 2016-01-06 LAB — LIPID PANEL
Cholesterol (LDL): 153 mg/dL — ABNORMAL HIGH (ref <130)
Cholesterol/HDL Ratio: 3.9
HDL Cholesterol: 64 mg/dL (ref >39)
Non-HDL Cholesterol: 187 mg/dL — ABNORMAL HIGH (ref 0–159)
Total Cholesterol: 251 mg/dL — ABNORMAL HIGH (ref <200)
Triglyceride: 171 mg/dL — ABNORMAL HIGH (ref <150)

## 2016-01-06 LAB — CK, CREATINE KINASE, TOTAL ACTIVITY: Creatine Kinase Total Activity: 42 U/L — ABNORMAL LOW (ref 43–274)

## 2016-01-06 MED ORDER — RANITIDINE HCL 150 MG OR TABS
150.0000 mg | ORAL_TABLET | Freq: Two times a day (BID) | ORAL | 1 refills | Status: DC
Start: 2016-01-06 — End: 2016-03-04

## 2016-01-06 NOTE — Progress Notes (Addendum)
DATE: 01/06/2016     PHYSICIAN NOTE:  Patient Name: Caitlin Wiley  Medical Record Number: [id number]  Visit Date/time: 01/06/2016     Date of Birth: 1950-12-17    CHIEF COMPLAINT:     Caitlin Wiley is a 65 year old female who is here today for review of multiple medical problems, and for medication management.     PROBLEMS TO BE ADDRESSED ON TODAYS VISIT:  (E78.00) Hypercholesterolemia  (primary encounter diagnosis)  (I48.0) Paroxysmal atrial fibrillation (HCC)  (J45.20) Mild intermittent asthma without complication  (99991111) Essential hypertension  (K21.9) Gastroesophageal reflux disease without esophagitis    HPI:  History provided by patient.  The patient is fasting.     Caitlin Wiley is a 65 year old female who is here today for review of multiple medical problems as reported above, and for medication management.     The patient was previously taking 50mg  lansoprazole, however, she notes an incident where she took her doses too closely together and had had an episode of arrhythmia. She reports stopping taking the lansoprazole all together. She would like another med for GERD.    The patient also complains of reoccurring back pain that is moderate in severity. She notes it is aggravated with getting up from a laying position. The patient was previously referred to physical therapy for this pain and states that she continues to do the exercises at home. She otherwise denies any reoccurring neck pain or radiation of pain into the upper extremities. The patient notes some numbness in her left 5th digit, however, she states this could be a result of recent trauma to the left elbow.     PROBLEM LIST:  Patient Active Problem List   Diagnosis   . Asthma   . Hypercholesterolemia   . Hx of colonic polyps--Dr Read--03/21/10  cleared for 5 years   . Edema   . Sleep apnea   . HTN (hypertension)   . Menopausal state   . Rash   . Pulmonary nodule   . Grief reaction   . Strain of thoracic spine   . OA  (osteoarthritis)   . GERD (gastroesophageal reflux disease)   . Palpitations   . Paroxysmal atrial fibrillation (HCC)   . Right cervical radiculopathy   . Obesity   . Chronic anticoagulation       SURGICAL HISTORY:  Past Surgical History:   Procedure Laterality Date   . COLONOSCOPY FLX DX W/COLLJ SPEC WHEN PFRMD  11/2005   . SURGICAL HX OTHER  08/1999    knee surgery       MEDICATIONS:  Current Outpatient Prescriptions   Medication Sig Dispense Refill   . Albuterol Sulfate HFA (VENTOLIN HFA) 108 (90 Base) MCG/ACT Inhalation Aero Soln Inhale 1-2 puffs by mouth 4 times a day as needed for shortness of breath/wheezing. 18 g 2   . Ascorbic Acid (VITAMIN C OR) 500 mg     . ASPIRIN OR 81 mg     . Flaxseed, Linseed, (FLAXSEED OIL) 1200 MG Oral Cap None Entered     . Fluticasone Propionate 50 MCG/ACT Nasal Suspension Spray 2 sprays into each nostril daily. 1 Inhaler 3   . Fluticasone Propionate HFA (FLOVENT HFA) 110 MCG/ACT Inhalation Aerosol Inhale 2 puffs by mouth 2 times a day. - RINSE MOUTH AFTER USE 3 Inhaler 1   . GLUCOSAMINE CHONDROITIN COMPLX OR      . Lansoprazole 30 MG Oral CAPSULE DELAYED RELEASE Take 1 capsule (30 mg)  by mouth daily. (Patient not taking: Reported on 01/06/2016) 90 capsule 1   . Magnesium 400 MG Oral Cap Take 400 mg by mouth.     . Montelukast Sodium 10 MG Oral Tab Take 1 tablet (10 mg) by mouth daily. 34 tablet 5   . Multiple Vitamins-Minerals (MULTIVITAMIN OR) no iron     . Probiotic Product (PROBIOTIC DAILY OR)      . Sotalol HCl 80 MG Oral Tab Take 1 tablet (80 mg) by mouth 2 times a day. 180 tablet 3   . Triamterene-HCTZ 37.5-25 MG Oral Tab Take 1 tablet by mouth daily. 90 tablet 3   . Warfarin Sodium (COUMADIN) 5 MG Oral Tab Take 7.5mg  X 2 days a week (T,Th) and 5mg  X 5 days a week or as instructed by your physician. 110 tablet 0     No current facility-administered medications for this visit.        Georgina Snell MD, have reviewed the medication list in its entirety and agree with all  contents.     ALLERGIES:  Review of patient's allergies indicates:  Allergies   Allergen Reactions   . Bee Venom    . Cats [Animals]    . Ciprofloxacin      Joint aching   . Dust Mite Extract      DUST   . Pollen Extract      POLLEN   . Sulfa Antibiotics        FAMILY HISTORY:  Family History     Problem (# of Occurrences) Relation (Name,Age of Onset)    Anesthesia Problems (1) Mother    Arthritis (3) Mother, Father, Other: grandmother    Asthma (1) Other: and/or allergies - grandfather    Breast Cancer (2) Mother: & tongue cancer, Other: grandmother    Cancer (2) Father, Other: grandparents    Colon Polyps (1) Father    Diabetes (1) Father    Heart Disease (3) Father: torn valve 01/1999, Maternal Grandfather, Paternal Grandfather    Hypertension (2) Mother, Father    Lupus (1) Aunt/Uncle: great-aunt    Mental Illness (2) Mother, Brother    Stroke (2) Mother: possible, Father    Tuberculosis (1) Other: great-grandmother          SOCIAL HISTORY:  Social History     Social History   . Marital status: N/A     Spouse name: N/A   . Number of children: 0   . Years of education: N/A     Occupational History   . Not on file.     Social History Main Topics   . Smoking status: Never Smoker   . Smokeless tobacco: Never Used   . Alcohol use 0.0 oz/week   . Drug use: No   . Sexual activity: No     Other Topics Concern   . 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 Rare exercise.         DXA completed on 07/31/12, resulting in normal bone density of the lumbar spine, and normal bone density at the L hip; ordered 04/30/15        Mammogram performed on 05/27/14, resulting in no mammographic features of malignancy         Colonoscopy performed on 04/03/14 with Dr. Estill Bamberg, resulting in moderate diverticulosis of the left side of the colon, long redundant colon, cleared for  seven years         TDAP- 03/13/10    Zostavax-advised 04/30/15        PAP-monitored by Dr. Harmon Pier         HIV screening, Hepatitis C screening-done in 2014       REVIEW OF SYSTEMS:  Female  CONSTITUTIONAL: Denies, fatigue, unexpected weight loss, unexpected weight gain  NEUROLOGIC: Reports left 5th digit numbness. Denies, headaches and tremors.  CARDIOVASCULAR: Denies, chest pain or pressure at rest or during exercise, palpitations  INTEGUMENTARY: Denies, skin rash, easy bruising, hair loss, changes in moles or new moles  RHEUMATOLOGIC/MSK: H/o osteoarthritis. Reports back pain. Denies, joint swelling, joint stiffness, any joint or arthritic problems  PSYCHOSOCIAL: Denies and any psychological problems  The rest of the review is unremarkable.      PHYSICAL EXAM:  BP 110/90   Pulse 65   Ht 5' 6.5" (1.689 m)   Wt (!) 317 lb (143.8 kg)   BMI 50.40 kg/m   Physical Examination-Female: Vital Signs as noted above.      General Appearance and Mental Status: Well Developed/Well Nourished  [ ]  Slender  [ ]  Average  [ ]  Heavy [x ] Obese  [ x] Appropriate Affect, Oriented to Person, Place, Time      Skin: Warm, dry to touch    Head: Normocephalic, atraumatic    Eyes: EOMI, PERLA    Ears: TM's clear bilaterally    Nose: Septum intact, no lesions, pink mucosa and turbinates, no discharge    Pharynx: Clear    Neck: Supple, without JVD  or goiter    Chest: Clear     CV: Heart: RRR, no murmur, no rub, no S3, S4 noted    Female GU: deferred    Rectal:  deferred    Extrem: no edema    MSK: Tenderness to the left mid thoracic paraspinal musculature. Otherwise normal gait and station, no clubbing, no cyanosis;  upper and lower extremities show no defects in symmetry, crepitance, tenderness, masses, or effusions;  normal range of motion without dislocation, subluxation or laxity; normal muscle tone and strength.    Neuro: intact      LAB RESULTS:  Pending.     ASSESSMENT AND PLAN:  Pertinent Labs & Imaging studies reviewed. (See chart for details)  Prior EMR records reviewed in EPIC as available and clinically relevant.    1.  Hypercholesterolemia  Patient has a h/o hypercholesterolemia. I ordered a fasting lipids and CMP for further evaluation.   - COMPREHENSIVE METABOLIC PANEL  - CREATINE KINASE TOTAL ACTIVITY  - LIPID PANEL    2. Paroxysmal atrial fibrillation [facility])  Patient is currently anticoagulated with Warfarin.     3. Mild intermittent asthma without complication  Controlled.     4. Essential hypertension  Patient's blood pressure today is 110/90. I ordered a CBC for further evaluation.   - CBC, DIFF    5. Gastroesophageal reflux disease without esophagitis  Controlled with ranitidine prescription.   - RaNITidine HCl 150 MG Oral Tab; Take 1 tablet (150 mg) by mouth 2 times a day.  Dispense: 60 tablet; Refill: 1    6. Thoracic myofascial strain, initial encounter  Discussed and reviewed. The patient reports practicing physical therapy exercises at home. She agreed to contact me if a referral was needed.     7. Ulnar neuropathy of left upper extremity  Patient reports left elbow trauma with associated 5th finger numbness. Symptoms should resolve on their own within the  next 6 weeks.       Further treatment dependant on the results of the tests.    Seek immediate attention if symptoms are worsening.    Call if symptoms are not resolving with therapy.    This appointment took 25 minutes and more than 50 % was spent in counseling and/or coordination of care.      This medical record has been electronically generated with voice recognition software. Every attempt is made to assure accuracy.   However. it may contain errors related to this process.

## 2016-01-06 NOTE — Patient Instructions (Signed)
Seek immediate attention if symptoms are worsening. Call if symptoms are not resolving with therapy.    A letter will be coming within the next two weeks reviewing the lab results.    Best wishes,    Dr. Elizabella Nolet

## 2016-01-07 ENCOUNTER — Telehealth (INDEPENDENT_AMBULATORY_CARE_PROVIDER_SITE_OTHER): Payer: Self-pay

## 2016-01-07 DIAGNOSIS — E7800 Pure hypercholesterolemia, unspecified: Secondary | ICD-10-CM

## 2016-01-07 NOTE — Telephone Encounter (Signed)
She declines the medication at this time. She would like to get her lipids tested again in one month to see if it has improved. Please sign order

## 2016-01-08 ENCOUNTER — Encounter (HOSPITAL_BASED_OUTPATIENT_CLINIC_OR_DEPARTMENT_OTHER): Payer: Self-pay

## 2016-01-08 ENCOUNTER — Ambulatory Visit: Payer: No Typology Code available for payment source

## 2016-01-08 ENCOUNTER — Ambulatory Visit (HOSPITAL_BASED_OUTPATIENT_CLINIC_OR_DEPARTMENT_OTHER): Payer: No Typology Code available for payment source

## 2016-01-08 VITALS — BP 110/80 | HR 75 | Wt 319.8 lb

## 2016-01-08 DIAGNOSIS — I480 Paroxysmal atrial fibrillation: Secondary | ICD-10-CM | POA: Insufficient documentation

## 2016-01-08 DIAGNOSIS — Z6841 Body Mass Index (BMI) 40.0 and over, adult: Secondary | ICD-10-CM

## 2016-01-08 LAB — PR PROTHROMBIN TIME, ONSITE: prothrombin INR: 2.8

## 2016-01-08 NOTE — Progress Notes (Signed)
Cardiovascular Clinical Evaluation Note    Primary Care Provider: Corey Harold, MD    Referring Provider: No ref. provider found    DOB:  1950/06/27    CHIEF COMPLAINT: Episode of atrial fibrillation.    HISTORY OF PRESENT ILLNESS: She was seen by Dr. Dorothyann Peng for a self-limited episode of atrial fibrillation.  She's not had any further episodes.  She is on sotalol therapy and Coumadin therapy.  I've told her that these little breakthrough episodes are common and that she will likely have additional episodes of atrial fibrillation in the future.  She is very distraught with the thought that she may have more episodes of atrial fibrillation.  She is not willing to consider atrial fibrillation ablation therapy at this time however I believe it's necessary.  She continues to work on her weight and has actually lost 1/2 pound to my scale but she believes that she has lost more.  I've encouraged her to continue weight loss to hopefully reduce the chance that she will have further problems with atrial fibrillation.    MEDICATIONS:    Current Outpatient Prescriptions   Medication Sig Dispense Refill   . Albuterol Sulfate HFA (VENTOLIN HFA) 108 (90 Base) MCG/ACT Inhalation Aero Soln Inhale 1-2 puffs by mouth 4 times a day as needed for shortness of breath/wheezing. 18 g 2   . Ascorbic Acid (VITAMIN C OR) 500 mg     . ASPIRIN OR 81 mg     . Flaxseed, Linseed, (FLAXSEED OIL) 1200 MG Oral Cap None Entered     . Fluticasone Propionate 50 MCG/ACT Nasal Suspension Spray 2 sprays into each nostril daily. 1 Inhaler 3   . Fluticasone Propionate HFA (FLOVENT HFA) 110 MCG/ACT Inhalation Aerosol Inhale 2 puffs by mouth 2 times a day. - RINSE MOUTH AFTER USE 3 Inhaler 1   . GLUCOSAMINE CHONDROITIN COMPLX OR      . Lansoprazole 30 MG Oral CAPSULE DELAYED RELEASE Take 1 capsule (30 mg) by mouth daily. (Patient not taking: Reported on 01/08/2016) 90 capsule 1   . Magnesium 400 MG Oral Cap Take 400 mg by mouth.     . Montelukast  Sodium 10 MG Oral Tab Take 1 tablet (10 mg) by mouth daily. 34 tablet 5   . Multiple Vitamins-Minerals (MULTIVITAMIN OR) no iron     . Probiotic Product (PROBIOTIC DAILY OR)      . RaNITidine HCl 150 MG Oral Tab Take 1 tablet (150 mg) by mouth 2 times a day. 60 tablet 1   . Sotalol HCl 80 MG Oral Tab Take 1 tablet (80 mg) by mouth 2 times a day. 180 tablet 3   . Triamterene-HCTZ 37.5-25 MG Oral Tab Take 1 tablet by mouth daily. 90 tablet 3   . Warfarin Sodium (COUMADIN) 5 MG Oral Tab Take 7.5mg  X 2 days a week (T,Th) and 5mg  X 5 days a week or as instructed by your physician. 110 tablet 0     No current facility-administered medications for this visit.        ALLERGIES:  Bee venom; Cats [animals]; Ciprofloxacin; Dust mite extract; Pollen extract; and Sulfa antibiotics      PROBLEM LIST:  Patient Active Problem List    Diagnosis Date Noted   . Chronic anticoagulation [Z79.01] 08/02/2015   . Right cervical radiculopathy [M54.12] 05/30/2015   . Obesity [E66.9] 05/30/2015   . Paroxysmal atrial fibrillation ([place]) [I48.0] 04/17/2014   . GERD (gastroesophageal reflux disease) [K21.9] 11/08/2012   .  Palpitations [R00.2] 08/21/2012   . OA (osteoarthritis) [M19.90] 07/28/2012   . Asthma [J45.909] 07/24/2012   . Hypercholesterolemia [E78.00] 07/24/2012   . Hx of colonic polyps--Dr Read--03/21/10  cleared for 5 years [Z86.010] 07/24/2012     Colonoscopy 01/2005     . Edema [R60.9] 07/24/2012   . Sleep apnea [G47.30] 07/24/2012   . HTN (hypertension) [I10] 07/24/2012   . Menopausal state [N95.1] 07/24/2012   . Rash [R21] 07/24/2012   . Pulmonary nodule [R91.1] 07/24/2012   . Grief reaction [F43.20] 07/24/2012     05/11/2011     . Strain of thoracic spine [S29.019A] 07/24/2012       REVIEW OF SYSTEMS   CONSTITUTIONAL:  No fever, weight loss.  EYES:  No visual loss.  ENT:  No hearing loss, exudate.  CARDIOVASCULAR:  See HPI.  GI:  No melena, hematochezia, hematemesis.  SKIN:  No lesions.  GU:  No dysuria, hematuria.  MUSCULOSKELETAL:   No myalgias, no arthralgias.  NEURO:  No TIA or CVA symptoms.  ALLERGIC/IMMUNOLOGIC:  No hives or rash.  PSYCHIATRIC:  No suicidal ideations.    PHYSICAL EXAM    CONSTITUTIONAL: This is a well-nourished female in no distress.    BP 110/80   Pulse 75   Wt (!) 319 lb 12.8 oz (145.1 kg)   SpO2 95%   BMI 50.84 kg/m   NEURO/PSYCH:  Oriented x 3 with normal affect.  EYES:  Conjunctivae pink, sclerae clear.  E/N/T:  Oral mucosa pink, dentition normal.  NECK:  Jugular venous pressure normal.  Thyroid is not enlarged.  RESPIRATORY:  Respiratory effort is normal.  Lungs are clear to percussion/auscultation.  CARDIOVASCULAR:  PMI at 5th ICS/MCL.  Regular rhythm.  S1, S2 normal.  No S3, S4, no murmur.  Carotid upstrokes are brisk, without bruits.  Abdominal aorta normal to palpation, no bruit.  Femoral pulses normal, no bruits.  Pedal pulses normal bilaterally.  Extremities no edema.  GASTROINTESTINAL:  Abdomen without masses or tenderness.  No hepatosplenomegaly.  EXTREMITIES:  No clubbing or cyanosis.  MUSCULOSKELETAL:  Normal gait and muscle strength.  SKIN:  Warm and dry, no lesions.    IMPRESSION: Paroxysmal atrial fibrillation with episode lasting only for a few hours.  She continues on warfarin therapy.  Follow-up again in 6 months.      [name], MD [facility]  01/08/2016  4:16 PM    Electronically transcribed by voice recognition technology.  Although every effort was made to ensure accuracy, some transcription errors may occur.

## 2016-01-08 NOTE — Progress Notes (Signed)
Chart notes, labs, pharmacy, outside records and medications were obtained and reviewed.

## 2016-01-08 NOTE — Progress Notes (Signed)
Anticoag Treatment Plan  As of 01/08/2016    INR goal:   2.0-3.0   TTR:   85.4 % (2.4 y)   Today's INR:   2.8   Full instructions:   7.5 mg on Thu; 5 mg all other days   Weekly total:   37.5 mg   Next INR check:   01/29/2016   Target end date:            Anticoagulation Episode Summary     INR check location:       Send INR reminders to:   [facility] EDM CLINICAL SUPPORT STAFF POOL    Comments:               SUBJECTIVE    Patient Findings     Negatives:   Signs/symptoms of thrombosis, Signs/symptoms of bleeding, Laboratory test error suspected, Change in health, Change in alcohol use, Change in activity, Upcoming invasive procedure, Emergency department visit, Upcoming dental procedure, Missed doses, Extra doses, Change in medications, Change in diet/appetite, Hospital admission, Bruising, Other complaints          OBJECTIVE    Anticoag Visit Date 01/08/2016 12/22/2015 12/15/2015 12/01/2015 11/13/2015 11/06/2015 10/09/2015   INR Goal 2.0-3.0 2.0-3.0 2.0-3.0 2.0-3.0 2.0-3.0 2.0-3.0 2.0-3.0   Selected INR 2.8 2.2 3.7 2.6 2.2 2.5 2.8   INR Date 01/08/2016 12/22/2015 12/15/2015 12/01/2015 11/13/2015 11/06/2015 10/09/2015   Previous Week Total 37.5 mg 37.5 mg 40 mg 40 mg 40 mg 40 mg 40 mg   Pt Deviation No No No No No No No   Pt Findings Comments - - Prevacid dc'd. Eating much less greens Patient is back in AF - cardioversion scheduled.  - - -   Next Week Total 37.5 mg 37.5 mg 37.5 mg 40 mg 40 mg 40 mg 40 mg   Full Instructions 7.5 mg on Thu; 5 mg all other days 7.5 mg on Thu; 5 mg all other days 7.5 mg on Thu; 5 mg all other days 7.5 mg on Tue, Thu; 5 mg all other days 7.5 mg on Tue, Thu; 5 mg all other days 7.5 mg on Tue, Thu; 5 mg all other days 7.5 mg on Tue, Thu; 5 mg all other days   Date of Next INR 01/29/2016 01/05/2016 - - 12/14/2015 12/07/2015 11/08/2015       ASSESSMENT    Therapeutic INR in stable patient and without complications.     PLAN    1.  Continue Warfarin  .  As of 01/08/2016    Maintenance plan:   7.5 mg on  Thu; 5 mg all other days   Full instructions:   7.5 mg on Thu; 5 mg all other days   Weekly total:   37.5 mg   Next INR check:   01/29/2016         Anticoagulation Episode Summary     Comments:             2. Caitlin Wiley acknowledged understanding of this plan      PATIENT INSTRUCTIONS    December 2017 Details    Sun Mon Tue Wed Thu Fri Sat          1               2                 3                4  5               6               7               8               9                 10               11               12               13               14               15               16                 17               18               19               20               21       7.5 mg   See details      22      5 mg         23      5 mg           24      5 mg         25      5 mg         26      5 mg         27      5 mg         28      7.5 mg         29      5 mg         30      5 mg           31      5 mg                Date Details   12/21 This INR check                 January 2018 Details    Sun Mon Tue Wed Thu Fri Sat      1      5 mg         2      5 mg         3      5 mg         4      7.5 mg         5      5 mg         6      5 mg           7      5 mg         8      5 mg  9      5 mg         10      5 mg         11      7.5 mg         12               13                 14               15               16               17               18               19               20                 21               22               23               24               25               26               27                 28               29               30               31                    Date Details   No additional details    Date of next INR:  01/29/2016               Reviewed per MD protocol.    Hayden Rasmussen, RN

## 2016-01-29 ENCOUNTER — Ambulatory Visit: Payer: Commercial Managed Care - HMO | Admitting: Unknown Physician Specialty

## 2016-01-29 DIAGNOSIS — I480 Paroxysmal atrial fibrillation: Secondary | ICD-10-CM | POA: Insufficient documentation

## 2016-01-29 LAB — PR PROTHROMBIN TIME, ONSITE: prothrombin INR: 2.6

## 2016-01-29 NOTE — Progress Notes (Signed)
Anticoag Treatment Plan  As of 01/29/2016    INR goal:   2.0-3.0   TTR:   85.7 % (2.4 y)   Today's INR:   2.6   Full instructions:   7.5 mg on Thu; 5 mg all other days   Weekly total:   37.5 mg   Next INR check:   02/26/2016   Target end date:            Anticoagulation Episode Summary     INR check location:       Send INR reminders to:   [facility] EDM CLINICAL SUPPORT STAFF POOL    Comments:               SUBJECTIVE    Patient Findings     Negatives:   Signs/symptoms of thrombosis, Signs/symptoms of bleeding, Laboratory test error suspected, Change in health, Change in alcohol use, Change in activity, Upcoming invasive procedure, Emergency department visit, Upcoming dental procedure, Missed doses, Extra doses, Change in medications, Change in diet/appetite, Hospital admission, Bruising, Other complaints          OBJECTIVE    Anticoag Visit Date 01/29/2016 01/08/2016 12/22/2015 12/15/2015 12/01/2015 11/13/2015 11/06/2015   INR Goal 2.0-3.0 2.0-3.0 2.0-3.0 2.0-3.0 2.0-3.0 2.0-3.0 2.0-3.0   Selected INR 2.6 2.8 2.2 3.7 2.6 2.2 2.5   INR Date 01/29/2016 01/08/2016 12/22/2015 12/15/2015 12/01/2015 11/13/2015 11/06/2015   Previous Week Total 37.5 mg 37.5 mg 37.5 mg 40 mg 40 mg 40 mg 40 mg   Pt Deviation No No No No No No No   Pt Findings Comments - - - Prevacid dc'd. Eating much less greens Patient is back in AF - cardioversion scheduled.  - -   Next Week Total 37.5 mg 37.5 mg 37.5 mg 37.5 mg 40 mg 40 mg 40 mg   Full Instructions 7.5 mg on Thu; 5 mg all other days 7.5 mg on Thu; 5 mg all other days 7.5 mg on Thu; 5 mg all other days 7.5 mg on Thu; 5 mg all other days 7.5 mg on Tue, Thu; 5 mg all other days 7.5 mg on Tue, Thu; 5 mg all other days 7.5 mg on Tue, Thu; 5 mg all other days   Date of Next INR 02/26/2016 01/29/2016 01/05/2016 - - 12/14/2015 12/07/2015       ASSESSMENT    Therapeutic INR in stable patient and without complications.     PLAN    1.  Continue Warfarin 7.5 mg X 1 day a week (Th) and 5 mg X 6 days a week and re  check INR in 4 weeks.  Reviewed per MD protocol.         .  As of 01/29/2016    Maintenance plan:   7.5 mg on Thu; 5 mg all other days   Full instructions:   7.5 mg on Thu; 5 mg all other days   Weekly total:   37.5 mg   No change documented:   [name], RN   Next INR check:   02/26/2016         Anticoagulation Episode Summary     Comments:             2. Zennie Goltz acknowledged understanding of this plan      PATIENT INSTRUCTIONS    January 2018 Details    Sun Mon Tue Wed Thu Fri Sat      1  2               3               4               5               6                 7               8               9               10               11       7.5 mg   See details      12      5 mg         13      5 mg           14      5 mg         15      5 mg         16      5 mg         17      5 mg         18      7.5 mg         19      5 mg         20      5 mg           21      5 mg         22      5 mg         23      5 mg         24      5 mg         25      7.5 mg         26      5 mg         27      5 mg           28      5 mg         29      5 mg         30      5 mg         31      5 mg             Date Details   01/11 This INR check                 February 2018 Details    Sun Mon Tue Wed Thu Fri Sat         1      7.5 mg         2      5 mg         3      5 mg           4      5 mg         5  5 mg         6      5 mg         7      5 mg         8      7.5 mg         9               10                 11               12               13               14               15               16               17                 18               19               20               21               22               23               24                 25               26               27               28                    Date Details   No additional details    Date of next INR:  02/26/2016

## 2016-02-04 ENCOUNTER — Other Ambulatory Visit: Payer: Self-pay

## 2016-02-04 DIAGNOSIS — R0602 Shortness of breath: Secondary | ICD-10-CM

## 2016-02-04 NOTE — Telephone Encounter (Signed)
The patient last received this medication at the requesting pharmacy on 01/14/16.

## 2016-02-05 MED ORDER — ALBUTEROL SULFATE HFA 108 (90 BASE) MCG/ACT IN AERS
1.0000 | INHALATION_SPRAY | Freq: Four times a day (QID) | RESPIRATORY_TRACT | 2 refills | Status: DC | PRN
Start: 2016-02-05 — End: 2016-03-10

## 2016-02-27 ENCOUNTER — Ambulatory Visit: Payer: Medicare HMO | Admitting: Unknown Physician Specialty

## 2016-02-27 DIAGNOSIS — I480 Paroxysmal atrial fibrillation: Secondary | ICD-10-CM | POA: Insufficient documentation

## 2016-02-27 LAB — PR PROTHROMBIN TIME, ONSITE: prothrombin INR: 3.2

## 2016-02-27 NOTE — Progress Notes (Signed)
Anticoag Treatment Plan  As of 02/27/2016    INR goal:   2.0-3.0   TTR:   85.1 % (2.5 y)   Today's INR:   3.2!   Full instructions:   7.5 mg on Tue, Thu; 5 mg all other days   Weekly total:   40 mg   Next INR check:   03/12/2016   Target end date:            Anticoagulation Episode Summary     INR check location:       Send INR reminders to:   [facility] EDM CLINICAL SUPPORT STAFF POOL    Comments:               SUBJECTIVE    Patient Findings     Positives:   Change in health, Other complaints    Negatives:   Signs/symptoms of thrombosis, Signs/symptoms of bleeding, Laboratory test error suspected, Change in alcohol use, Change in activity, Upcoming invasive procedure, Emergency department visit, Upcoming dental procedure, Missed doses, Extra doses, Change in medications, Change in diet/appetite, Hospital admission, Bruising    Comments:   Has had a cold the last week, taking dose differently than last recorded          OBJECTIVE    Anticoag Visit Date 02/27/2016 01/29/2016 01/08/2016 12/22/2015 12/15/2015 12/01/2015 11/13/2015   INR Goal 2.0-3.0 2.0-3.0 2.0-3.0 2.0-3.0 2.0-3.0 2.0-3.0 2.0-3.0   Selected INR 3.2 2.6 2.8 2.2 3.7 2.6 2.2   INR Date 02/27/2016 01/29/2016 01/08/2016 12/22/2015 12/15/2015 12/01/2015 11/13/2015   Previous Week Total 37.5 mg 37.5 mg 37.5 mg 37.5 mg 40 mg 40 mg 40 mg   Pt Deviation No No No No No No No   Pt Findings Comments Has had a cold the last week, taking dose differently than last recorded - - - Prevacid dc'd. Eating much less greens Patient is back in AF - cardioversion scheduled.  -   Next Week Total 40 mg 37.5 mg 37.5 mg 37.5 mg 37.5 mg 40 mg 40 mg   Full Instructions 7.5 mg on Tue, Thu; 5 mg all other days 7.5 mg on Thu; 5 mg all other days 7.5 mg on Thu; 5 mg all other days 7.5 mg on Thu; 5 mg all other days 7.5 mg on Thu; 5 mg all other days 7.5 mg on Tue, Thu; 5 mg all other days 7.5 mg on Tue, Thu; 5 mg all other days   Date of Next INR 03/12/2016 02/26/2016 01/29/2016 01/05/2016 - - 12/14/2015          ASSESSMENT    Elevated INR in association with having a cold for the last week, eating less greens and without complications.     PLAN    1.  Increase greens x 2 days, continue 7.5 mg X 2 days a week (T,Th) and 5 mg X 5 days a week and re check INR in 2 weeks.  Reviewed per MD protocol.  Routing to Dr. Dorothyann Peng for review and signature.          .  As of 02/27/2016    Maintenance plan:   7.5 mg on Tue, Thu; 5 mg all other days   Full instructions:   7.5 mg on Tue, Thu; 5 mg all other days   Weekly total:   40 mg   Next INR check:   03/12/2016         Anticoagulation Episode Summary     Comments:  2. Loretta Getz acknowledged understanding of this plan      PATIENT INSTRUCTIONS    February 2018 Details    Sun Glori Luis Thu Fri Sat         1               2               3                 4               5               6               7               8               9      5  mg   See details      10      5 mg           11      5 mg         12      5 mg         13      7.5 mg         14      5 mg         15      7.5 mg         16      5 mg         17      5 mg           18      5 mg         19      5 mg         20      7.5 mg         21      5 mg         22      7.5 mg         23      5 mg         24                 25               26               27               28                    Date Details   02/09 This INR check       Date of next INR:  03/12/2016

## 2016-03-04 ENCOUNTER — Other Ambulatory Visit (INDEPENDENT_AMBULATORY_CARE_PROVIDER_SITE_OTHER): Payer: Self-pay

## 2016-03-04 DIAGNOSIS — K219 Gastro-esophageal reflux disease without esophagitis: Secondary | ICD-10-CM

## 2016-03-05 ENCOUNTER — Other Ambulatory Visit (HOSPITAL_BASED_OUTPATIENT_CLINIC_OR_DEPARTMENT_OTHER): Payer: Self-pay

## 2016-03-05 DIAGNOSIS — I480 Paroxysmal atrial fibrillation: Secondary | ICD-10-CM

## 2016-03-05 MED ORDER — RANITIDINE HCL 150 MG OR TABS
ORAL_TABLET | ORAL | 1 refills | Status: DC
Start: 2016-03-05 — End: 2016-08-17

## 2016-03-08 MED ORDER — SOTALOL HCL 80 MG OR TABS
80.0000 mg | ORAL_TABLET | Freq: Two times a day (BID) | ORAL | 3 refills | Status: DC
Start: 2016-03-08 — End: 2017-03-23

## 2016-03-09 ENCOUNTER — Other Ambulatory Visit (HOSPITAL_BASED_OUTPATIENT_CLINIC_OR_DEPARTMENT_OTHER): Payer: Self-pay

## 2016-03-09 DIAGNOSIS — I480 Paroxysmal atrial fibrillation: Secondary | ICD-10-CM

## 2016-03-10 ENCOUNTER — Other Ambulatory Visit: Payer: Self-pay

## 2016-03-10 DIAGNOSIS — J4520 Mild intermittent asthma, uncomplicated: Secondary | ICD-10-CM

## 2016-03-10 DIAGNOSIS — R0602 Shortness of breath: Secondary | ICD-10-CM

## 2016-03-10 MED ORDER — WARFARIN SODIUM 5 MG OR TABS
ORAL_TABLET | ORAL | 0 refills | Status: DC
Start: 2016-03-10 — End: 2016-05-24

## 2016-03-10 NOTE — Telephone Encounter (Addendum)
The patient last received Montelukast at the requesting pharmacy on 03/05/16    The patient last received Ventolin at the requesting pharmacy on 01/14/16

## 2016-03-11 MED ORDER — ALBUTEROL SULFATE HFA 108 (90 BASE) MCG/ACT IN AERS
1.0000 | INHALATION_SPRAY | Freq: Four times a day (QID) | RESPIRATORY_TRACT | 2 refills | Status: DC | PRN
Start: 2016-03-11 — End: 2016-05-24

## 2016-03-11 MED ORDER — MONTELUKAST SODIUM 10 MG OR TABS
10.0000 mg | ORAL_TABLET | Freq: Every day | ORAL | 11 refills | Status: DC
Start: 2016-03-11 — End: 2017-02-16

## 2016-03-12 ENCOUNTER — Ambulatory Visit: Payer: Medicare HMO

## 2016-03-12 DIAGNOSIS — I480 Paroxysmal atrial fibrillation: Secondary | ICD-10-CM

## 2016-03-12 LAB — PR PROTHROMBIN TIME, ONSITE: prothrombin INR: 2.9

## 2016-03-12 NOTE — Progress Notes (Signed)
Reviewed INR data and agree with warfarin dosing.

## 2016-03-12 NOTE — Progress Notes (Signed)
Anticoag Treatment Plan  As of 03/12/2016    INR goal:   2.0-3.0   TTR:   84.3 % (2.5 y)   Today's INR:   2.9   Full instructions:   7.5 mg on Tue; 5 mg all other days   Weekly total:   37.5 mg   Next INR check:   03/26/2016   Target end date:            Anticoagulation Episode Summary     INR check location:       Send INR reminders to:   [facility] EDM CLINICAL SUPPORT STAFF POOL    Comments:               SUBJECTIVE    Patient Findings     Positives:   Missed doses    Negatives:   Signs/symptoms of thrombosis, Signs/symptoms of bleeding, Laboratory test error suspected, Change in health, Change in alcohol use, Change in activity, Upcoming invasive procedure, Emergency department visit, Upcoming dental procedure, Extra doses, Change in medications, Change in diet/appetite, Hospital admission, Bruising, Other complaints    Comments:   Missed a 7.5 mg dose (took 5 mg instead)          OBJECTIVE    Anticoag Visit Date 03/12/2016 02/27/2016 01/29/2016 01/08/2016 12/22/2015 12/15/2015 12/01/2015   INR Goal 2.0-3.0 2.0-3.0 2.0-3.0 2.0-3.0 2.0-3.0 2.0-3.0 2.0-3.0   Selected INR 2.9 3.2 2.6 2.8 2.2 3.7 2.6   INR Date 03/12/2016 02/27/2016 01/29/2016 01/08/2016 12/22/2015 12/15/2015 12/01/2015   Previous Week Total 40 mg 37.5 mg 37.5 mg 37.5 mg 37.5 mg 40 mg 40 mg   Pt Deviation No No No No No No No   Pt Findings Comments Missed a 7.5 mg dose (took 5 mg instead) Has had a cold the last week, taking dose differently than last recorded - - - Prevacid dc'd. Eating much less greens Patient is back in AF - cardioversion scheduled.    Next Week Total 37.5 mg 40 mg 37.5 mg 37.5 mg 37.5 mg 37.5 mg 40 mg   Full Instructions 7.5 mg on Tue; 5 mg all other days 7.5 mg on Tue, Thu; 5 mg all other days 7.5 mg on Thu; 5 mg all other days 7.5 mg on Thu; 5 mg all other days 7.5 mg on Thu; 5 mg all other days 7.5 mg on Thu; 5 mg all other days 7.5 mg on Tue, Thu; 5 mg all other days   Date of Next INR 03/26/2016 03/12/2016 02/26/2016 01/29/2016 01/05/2016 - -              ASSESSMENT    Therapeutic INR in stable patient and without complications. Took 2.5 mg less than ordered in the past week    PLAN    1.  Decrease Warfarin by 2.5 mg weekly to 7.5 mg on Tuesdays and 5 mg other days of the week.  .  As of 03/12/2016    Maintenance plan:   7.5 mg on Tue; 5 mg all other days   Full instructions:   7.5 mg on Tue; 5 mg all other days   Weekly total:   37.5 mg   Next INR check:   03/26/2016         Anticoagulation Episode Summary     Comments:             2. Caitlin Wiley acknowledged understanding of this plan      PATIENT INSTRUCTIONS    February 2018 Details  Dorene Grebe Tue Wed Thu Fri Sat         1               2               3                 4               5               6               7               8               9               10                 11               12               13               14               15               16               17                 18               19               20               21               22               23      5  mg   See details      24      5 mg           25      5 mg         26      5 mg         27      7.5 mg         28      5 mg             Date Details   02/23 This INR check                 March 2018 Details    Sun Mon Tue Wed Thu Fri Sat         1      5 mg         2      5 mg         3      5 mg           4      5 mg         5      5 mg         6      7.5 mg         7  5 mg         8      5 mg         9      5 mg         10                 11               12               13               14               15               16               17                 18               19               20               21               22               23               24                 25               26               27               28               29               30               31                 Date Details   No additional details    Date of next INR:  03/26/2016

## 2016-03-14 ENCOUNTER — Other Ambulatory Visit: Payer: Self-pay

## 2016-03-14 DIAGNOSIS — J4520 Mild intermittent asthma, uncomplicated: Secondary | ICD-10-CM

## 2016-03-14 NOTE — Telephone Encounter (Signed)
Refill Requested via fax,  Last filled 02/06/16

## 2016-03-15 MED ORDER — FLUTICASONE PROPIONATE HFA 110 MCG/ACT IN AERO
2.0000 | INHALATION_SPRAY | Freq: Two times a day (BID) | RESPIRATORY_TRACT | 5 refills | Status: DC
Start: 2016-03-15 — End: 2017-08-23

## 2016-03-25 ENCOUNTER — Ambulatory Visit: Payer: Medicare HMO | Admitting: Unknown Physician Specialty

## 2016-03-25 DIAGNOSIS — I480 Paroxysmal atrial fibrillation: Secondary | ICD-10-CM | POA: Insufficient documentation

## 2016-03-25 LAB — PR PROTHROMBIN TIME, ONSITE: prothrombin INR: 2.7

## 2016-03-25 NOTE — Progress Notes (Signed)
Anticoag Treatment Plan  As of 03/25/2016    INR goal:   2.0-3.0   TTR:   84.5 % (2.6 y)   Today's INR:   2.7   Full instructions:   7.5 mg on Tue; 5 mg all other days   Weekly total:   37.5 mg   Next INR check:   04/15/2016   Target end date:            Anticoagulation Episode Summary     INR check location:       Send INR reminders to:   [facility] EDM CLINICAL SUPPORT STAFF POOL    Comments:               SUBJECTIVE    Patient Findings     Negatives:   Signs/symptoms of thrombosis, Signs/symptoms of bleeding, Laboratory test error suspected, Change in health, Change in alcohol use, Change in activity, Upcoming invasive procedure, Emergency department visit, Upcoming dental procedure, Missed doses, Extra doses, Change in medications, Change in diet/appetite, Hospital admission, Bruising, Other complaints          OBJECTIVE    Anticoag Visit Date 03/25/2016 03/12/2016 02/27/2016 01/29/2016 01/08/2016 12/22/2015 12/15/2015   INR Goal 2.0-3.0 2.0-3.0 2.0-3.0 2.0-3.0 2.0-3.0 2.0-3.0 2.0-3.0   Selected INR 2.7 2.9 3.2 2.6 2.8 2.2 3.7   INR Date 03/25/2016 03/12/2016 02/27/2016 01/29/2016 01/08/2016 12/22/2015 12/15/2015   Previous Week Total 37.5 mg 40 mg 37.5 mg 37.5 mg 37.5 mg 37.5 mg 40 mg   Pt Deviation No No No No No No No   Pt Findings Comments - Missed a 7.5 mg dose (took 5 mg instead) Has had a cold the last week, taking dose differently than last recorded - - - Prevacid dc'd. Eating much less greens   Next Week Total 37.5 mg 37.5 mg 40 mg 37.5 mg 37.5 mg 37.5 mg 37.5 mg   Full Instructions 7.5 mg on Tue; 5 mg all other days 7.5 mg on Tue; 5 mg all other days 7.5 mg on Tue, Thu; 5 mg all other days 7.5 mg on Thu; 5 mg all other days 7.5 mg on Thu; 5 mg all other days 7.5 mg on Thu; 5 mg all other days 7.5 mg on Thu; 5 mg all other days   Date of Next INR 04/15/2016 03/26/2016 03/12/2016 02/26/2016 01/29/2016 01/05/2016 -       ASSESSMENT    Therapeutic INR in stable patient and without complications.     PLAN    1.  Continue Warfarin 7.5 mg  on Tuesdays and 5 mg the remaining days of the week and re check INR in 3 weeks.  Reviewed per MD protocol.        .  As of 03/25/2016    Maintenance plan:   7.5 mg on Tue; 5 mg all other days   Full instructions:   7.5 mg on Tue; 5 mg all other days   Weekly total:   37.5 mg   No change documented:   [name], RN   Next INR check:   04/15/2016         Anticoagulation Episode Summary     Comments:             2. Caitlin Wiley acknowledged understanding of this plan      PATIENT INSTRUCTIONS    March 2018 Details    Sun Mon Tue Wed Thu Fri Sat         1  2               3                 4               5               6               7               8      5  mg   See details      9      5 mg         10      5 mg           11      5 mg         12      5 mg         13      7.5 mg         14      5 mg         15      5 mg         16      5 mg         17      5 mg           18      5 mg         19      5 mg         20      7.5 mg         21      5 mg         22      5 mg         23      5 mg         24      5 mg           25      5 mg         26      5 mg         27      7.5 mg         28      5 mg         29      5 mg         30               31                Date Details   03/08 This INR check       Date of next INR:  04/15/2016

## 2016-04-15 ENCOUNTER — Ambulatory Visit: Payer: Medicare HMO | Admitting: Unknown Physician Specialty

## 2016-04-15 DIAGNOSIS — I480 Paroxysmal atrial fibrillation: Secondary | ICD-10-CM | POA: Insufficient documentation

## 2016-04-15 LAB — PR PROTHROMBIN TIME, ONSITE: prothrombin INR: 2.8

## 2016-04-15 NOTE — Progress Notes (Signed)
Anticoag Treatment Plan  As of 04/15/2016    INR goal:   2.0-3.0   TTR:   84.9 % (2.6 y)   Today's INR:   2.8   Full instructions:   7.5 mg on Tue; 5 mg all other days   Weekly total:   37.5 mg   Next INR check:   05/13/2016   Target end date:            Anticoagulation Episode Summary     INR check location:       Send INR reminders to:   [facility] EDM CLINICAL SUPPORT STAFF POOL    Comments:               SUBJECTIVE    Patient Findings     Negatives:   Signs/symptoms of thrombosis, Signs/symptoms of bleeding, Laboratory test error suspected, Change in health, Change in alcohol use, Change in activity, Upcoming invasive procedure, Emergency department visit, Upcoming dental procedure, Missed doses, Extra doses, Change in medications, Change in diet/appetite, Hospital admission, Bruising, Other complaints          OBJECTIVE    Anticoag Visit Date 04/15/2016 03/25/2016 03/12/2016 02/27/2016 01/29/2016 01/08/2016 12/22/2015   INR Goal 2.0-3.0 2.0-3.0 2.0-3.0 2.0-3.0 2.0-3.0 2.0-3.0 2.0-3.0   Selected INR 2.8 2.7 2.9 3.2 2.6 2.8 2.2   INR Date 04/15/2016 03/25/2016 03/12/2016 02/27/2016 01/29/2016 01/08/2016 12/22/2015   Previous Week Total 37.5 mg 37.5 mg 40 mg 37.5 mg 37.5 mg 37.5 mg 37.5 mg   Pt Deviation No No No No No No No   Pt Findings Comments - - Missed a 7.5 mg dose (took 5 mg instead) Has had a cold the last week, taking dose differently than last recorded - - -   Next Week Total 37.5 mg 37.5 mg 37.5 mg 40 mg 37.5 mg 37.5 mg 37.5 mg   Full Instructions 7.5 mg on Tue; 5 mg all other days 7.5 mg on Tue; 5 mg all other days 7.5 mg on Tue; 5 mg all other days 7.5 mg on Tue, Thu; 5 mg all other days 7.5 mg on Thu; 5 mg all other days 7.5 mg on Thu; 5 mg all other days 7.5 mg on Thu; 5 mg all other days   Date of Next INR 05/13/2016 04/15/2016 03/26/2016 03/12/2016 02/26/2016 01/29/2016 01/05/2016       ASSESSMENT    Therapeutic INR in stable patient and without complications.     PLAN    1.  Continue Warfarin 7.5 mg on Tuesdays and 5 mg the  remaining days of the week and re check INR in 4 weeks.  Reviewed per MD protocol.        .  As of 04/15/2016    Maintenance plan:   7.5 mg on Tue; 5 mg all other days   Full instructions:   7.5 mg on Tue; 5 mg all other days   Weekly total:   37.5 mg   No change documented:   Caitlin Mattes, RN   Next INR check:   05/13/2016         Anticoagulation Episode Summary     Comments:             2. Caitlin Wiley acknowledged understanding of this plan      PATIENT INSTRUCTIONS    March 2018 Details    Sun Mon Tue Wed Thu Fri Sat         1  2               3                 4               5               6               7               8               9               10                 11               12               13               14               15               16               17                 18               19               20               21               22               23               24                 25               26               27               28               29      5  mg   See details      30      5 mg         31      5 mg          Date Details   03/29 This INR check                 April 2018 Details    Sun Mon Tue Wed Thu Fri Sat     1      5 mg         2      5 mg         3      7.5 mg         4      5 mg         5      5 mg         6      5 mg  7      5 mg           8      5 mg         9      5 mg         10      7.5 mg         11      5 mg         12      5 mg         13      5 mg         14      5 mg           15      5 mg         16      5 mg         17      7.5 mg         18      5 mg         19      5 mg         20      5 mg         21      5 mg           22      5 mg         23      5 mg         24      7.5 mg         25      5 mg         26      5 mg         27               28                 29               30                      Date Details   No additional details    Date of next INR:  05/13/2016

## 2016-05-13 ENCOUNTER — Ambulatory Visit: Payer: Medicare HMO

## 2016-05-13 DIAGNOSIS — I480 Paroxysmal atrial fibrillation: Secondary | ICD-10-CM | POA: Insufficient documentation

## 2016-05-13 LAB — PR PROTHROMBIN TIME, ONSITE: prothrombin INR: 2.3

## 2016-05-13 NOTE — Progress Notes (Signed)
Anticoag Treatment Plan  As of 05/13/2016    INR goal:   2.0-3.0   TTR:   85.3 % (2.7 y)   Today's INR:   2.3   Full instructions:   7.5 mg on Tue; 5 mg all other days   Weekly total:   37.5 mg   Next INR check:   06/12/2016   Target end date:            Anticoagulation Episode Summary     INR check location:       Send INR reminders to:   [facility] EDM CLINICAL SUPPORT STAFF POOL    Comments:               SUBJECTIVE    Patient Findings     Negatives:   Signs/symptoms of thrombosis, Signs/symptoms of bleeding, Laboratory test error suspected, Change in health, Change in alcohol use, Change in activity, Upcoming invasive procedure, Emergency department visit, Upcoming dental procedure, Missed doses, Extra doses, Change in medications, Change in diet/appetite, Hospital admission, Bruising, Other complaints          OBJECTIVE    Anticoag Visit Date 05/13/2016 04/15/2016 03/25/2016 03/12/2016 02/27/2016 01/29/2016 01/08/2016   INR Goal 2.0-3.0 2.0-3.0 2.0-3.0 2.0-3.0 2.0-3.0 2.0-3.0 2.0-3.0   Selected INR 2.3 2.8 2.7 2.9 3.2 2.6 2.8   INR Date 05/13/2016 04/15/2016 03/25/2016 03/12/2016 02/27/2016 01/29/2016 01/08/2016   Previous Week Total 37.5 mg 37.5 mg 37.5 mg 40 mg 37.5 mg 37.5 mg 37.5 mg   Pt Deviation No No No No No No No   Pt Findings Comments - - - Missed a 7.5 mg dose (took 5 mg instead) Has had a cold the last week, taking dose differently than last recorded - -   Next Week Total 37.5 mg 37.5 mg 37.5 mg 37.5 mg 40 mg 37.5 mg 37.5 mg   Full Instructions 7.5 mg on Tue; 5 mg all other days 7.5 mg on Tue; 5 mg all other days 7.5 mg on Tue; 5 mg all other days 7.5 mg on Tue; 5 mg all other days 7.5 mg on Tue, Thu; 5 mg all other days 7.5 mg on Thu; 5 mg all other days 7.5 mg on Thu; 5 mg all other days   Date of Next INR 06/12/2016 05/13/2016 04/15/2016 03/26/2016 03/12/2016 02/26/2016 01/29/2016       ASSESSMENT    Therapeutic INR in stable patient and without complications.     PLAN    1.  Continue Warfarin  Continue 7.5 mg on Tuesdays and 5  mg the remaining days of the week and re check INR in 4 weeks.    .  As of 05/13/2016    Maintenance plan:   7.5 mg on Tue; 5 mg all other days   Full instructions:   7.5 mg on Tue; 5 mg all other days   Weekly total:   37.5 mg   Next INR check:   06/12/2016         Anticoagulation Episode Summary     Comments:             2. Caitlin Wiley acknowledged understanding of this plan      PATIENT INSTRUCTIONS    April 2018 Details    Sun Mon Tue Wed Thu Fri Sat     1               2  3               4               5               6               7                 8               9               10               11               12               13               14                 15               16               17               18               19               20               21                 22               23               24               25               26      5  mg   See details      27      5 mg         28      5 mg           29      5 mg         30      5 mg               Date Details   04/26 This INR check                 May 2018 Details    Sun Mon Tue Wed Thu Fri Sat       1      7.5 mg         2      5 mg         3      5 mg         4      5 mg         5      5 mg           6      5 mg         7      5 mg         8  7.5 mg         9      5 mg         10      5 mg         11      5 mg         12      5 mg           13      5 mg         14      5 mg         15      7.5 mg         16      5 mg         17      5 mg         18      5 mg         19      5 mg           20      5 mg         21      5 mg         22      7.5 mg         23      5 mg         24      5 mg         25      5 mg         26      5 mg           27               28               29               30               31                   Date Details   No additional details    Date of next INR:  06/12/2016               Reviewed per MD protocol.    Hayden Rasmussen, RN

## 2016-05-23 ENCOUNTER — Other Ambulatory Visit: Payer: Self-pay

## 2016-05-23 DIAGNOSIS — R600 Localized edema: Secondary | ICD-10-CM

## 2016-05-24 ENCOUNTER — Other Ambulatory Visit: Payer: Self-pay

## 2016-05-24 ENCOUNTER — Other Ambulatory Visit (HOSPITAL_BASED_OUTPATIENT_CLINIC_OR_DEPARTMENT_OTHER): Payer: Self-pay

## 2016-05-24 DIAGNOSIS — R0602 Shortness of breath: Secondary | ICD-10-CM

## 2016-05-24 DIAGNOSIS — Z7901 Long term (current) use of anticoagulants: Secondary | ICD-10-CM

## 2016-05-24 DIAGNOSIS — I480 Paroxysmal atrial fibrillation: Secondary | ICD-10-CM

## 2016-05-24 MED ORDER — WARFARIN SODIUM 5 MG OR TABS
ORAL_TABLET | ORAL | 0 refills | Status: DC
Start: 2016-05-24 — End: 2016-05-25

## 2016-05-24 MED ORDER — TRIAMTERENE-HCTZ 37.5-25 MG OR TABS
ORAL_TABLET | ORAL | 1 refills | Status: DC
Start: 2016-05-24 — End: 2016-11-26

## 2016-05-25 ENCOUNTER — Telehealth (HOSPITAL_BASED_OUTPATIENT_CLINIC_OR_DEPARTMENT_OTHER): Payer: Self-pay

## 2016-05-25 DIAGNOSIS — I480 Paroxysmal atrial fibrillation: Secondary | ICD-10-CM

## 2016-05-25 MED ORDER — WARFARIN SODIUM 5 MG OR TABS
ORAL_TABLET | ORAL | 0 refills | Status: DC
Start: 2016-05-25 — End: 2016-08-17

## 2016-05-25 MED ORDER — ALBUTEROL SULFATE HFA 108 (90 BASE) MCG/ACT IN AERS
1.0000 | INHALATION_SPRAY | Freq: Four times a day (QID) | RESPIRATORY_TRACT | 3 refills | Status: DC | PRN
Start: 2016-05-25 — End: 2017-11-19

## 2016-05-25 NOTE — Telephone Encounter (Signed)
Called the pharmacy and clarified Warfarin dose.    [place], RN

## 2016-05-25 NOTE — Telephone Encounter (Signed)
(  TEXTING IS AN OPTION FOR [facility] ONLY)  Is this a [employer] clinic? No      RETURN CALL: Detailed message on voicemail only      SUBJECT:  Medication Management/Questions     MEDICATION(S): Warfarin Sodium (COUMADIN) 5 MG Oral Tab  CONCERNS/QUESTIONS: Directions clarification  ADDITIONAL INFORMATION: Pharmacy needs direction clarification. Thank You.

## 2016-06-10 ENCOUNTER — Ambulatory Visit: Payer: Medicare HMO

## 2016-06-10 DIAGNOSIS — I480 Paroxysmal atrial fibrillation: Secondary | ICD-10-CM | POA: Insufficient documentation

## 2016-06-10 LAB — PR PROTHROMBIN TIME, ONSITE: prothrombin INR: 3

## 2016-06-10 NOTE — Progress Notes (Signed)
Anticoag Treatment Plan  As of 06/10/2016    INR goal:   2.0-3.0   TTR:   85.7 % (2.8 y)   Today's INR:   3.0   Full warfarin instructions:   5 mg every day   Weekly warfarin total:   35 mg   Next INR check:   06/24/2016   Target end date:            Anticoagulation Episode Summary     INR check location:       Send INR reminders to:   [facility] EDM CLINICAL SUPPORT STAFF POOL    Comments:               SUBJECTIVE    Patient Findings     Positives:   Change in diet/appetite    Negatives:   Signs/symptoms of thrombosis, Signs/symptoms of bleeding, Laboratory test error suspected, Change in health, Change in alcohol use, Change in activity, Upcoming invasive procedure, Emergency department visit, Upcoming dental procedure, Missed doses, Extra doses, Change in medications, Hospital admission, Bruising, Other complaints    Comments:   Less greens intake          OBJECTIVE    Anticoag Visit Date 06/10/2016 05/13/2016 04/15/2016 03/25/2016 03/12/2016 02/27/2016 01/29/2016   INR Goal 2.0-3.0 2.0-3.0 2.0-3.0 2.0-3.0 2.0-3.0 2.0-3.0 2.0-3.0   Selected INR 3.0 2.3 2.8 2.7 2.9 3.2 2.6   INR Date 06/10/2016 05/13/2016 04/15/2016 03/25/2016 03/12/2016 02/27/2016 01/29/2016   Previous Week Total 37.5 mg 37.5 mg 37.5 mg 37.5 mg 40 mg 37.5 mg 37.5 mg   Pt Deviation No No No No No No No   Pt Findings Comments Less greens intake - - - Missed a 7.5 mg dose (took 5 mg instead) Has had a cold the last week, taking dose differently than last recorded -   Next Week Total 35 mg 37.5 mg 37.5 mg 37.5 mg 37.5 mg 40 mg 37.5 mg   Full Instructions 5 mg every day 7.5 mg on Tue; 5 mg all other days 7.5 mg on Tue; 5 mg all other days 7.5 mg on Tue; 5 mg all other days 7.5 mg on Tue; 5 mg all other days 7.5 mg on Tue, Thu; 5 mg all other days 7.5 mg on Thu; 5 mg all other days   Date of Next INR 06/24/2016 06/12/2016 05/13/2016 04/15/2016 03/26/2016 03/12/2016 02/26/2016       ASSESSMENT    Therapeutic INR in stable patient and without complications.     PLAN    1.  Decrease Warfarin  2` known decrease in greens intake, 5 mg 7 days of the week and re check INR in 2 weeks.  .  As of 06/10/2016    Warfarin maintenance plan:   5 mg every day   Full warfarin instructions:   5 mg every day   Weekly warfarin total:   35 mg   Next INR check:   06/24/2016         Anticoagulation Episode Summary     Comments:             2. Veena Sturgess acknowledged understanding of this plan      PATIENT INSTRUCTIONS    May 2018 Details    Sun Mon Tue Wed Thu Fri Sat       1               2  3               4               5                 6               7               8               9               10               11               12                 13               14               15               16               17               18               19                 20               21               22               23               24      5  mg   See details      25      5 mg         26      5 mg           27      5 mg         28      5 mg         29      5 mg         30      5 mg         31      5 mg            Date Details   05/24 This INR check                 June 2018 Details    Sun Mon Tue Wed Thu Fri Sat          1      5 mg         2      5 mg           3      5 mg         4      5 mg         5      5 mg         6      5 mg         7  5 mg         8               9                 10               11               12               13               14               15               16                 17               18               19               20               21               22               23                 24               25               26               27               28               29               30                 Date Details   No additional details    Date of next INR:  06/24/2016

## 2016-06-24 ENCOUNTER — Ambulatory Visit: Payer: Medicare HMO

## 2016-06-24 DIAGNOSIS — I480 Paroxysmal atrial fibrillation: Secondary | ICD-10-CM | POA: Insufficient documentation

## 2016-06-24 LAB — PR PROTHROMBIN TIME, ONSITE: prothrombin INR: 2.6

## 2016-06-24 NOTE — Progress Notes (Signed)
Anticoag Treatment Plan  As of 06/24/2016    INR goal:   2.0-3.0   TTR:   85.9 % (2.8 y)   Today's INR:   2.6   Full warfarin instructions:   5 mg every day   Weekly warfarin total:   35 mg   Next INR check:   07/15/2016   Target end date:            Anticoagulation Episode Summary     INR check location:       Send INR reminders to:   [facility] EDM CLINICAL SUPPORT STAFF POOL    Comments:               SUBJECTIVE    Patient Findings     Negatives:   Signs/symptoms of thrombosis, Signs/symptoms of bleeding, Laboratory test error suspected, Change in health, Change in alcohol use, Change in activity, Upcoming invasive procedure, Emergency department visit, Upcoming dental procedure, Missed doses, Extra doses, Change in medications, Change in diet/appetite, Hospital admission, Bruising, Other complaints          OBJECTIVE    Anticoag Visit Date 06/24/2016 06/10/2016 05/13/2016 04/15/2016 03/25/2016 03/12/2016 02/27/2016   INR Goal 2.0-3.0 2.0-3.0 2.0-3.0 2.0-3.0 2.0-3.0 2.0-3.0 2.0-3.0   Selected INR 2.6 3.0 2.3 2.8 2.7 2.9 3.2   INR Date 06/24/2016 06/10/2016 05/13/2016 04/15/2016 03/25/2016 03/12/2016 02/27/2016   Previous Week Total 35 mg 37.5 mg 37.5 mg 37.5 mg 37.5 mg 40 mg 37.5 mg   Pt Deviation No No No No No No No   Pt Findings Comments - Less greens intake - - - Missed a 7.5 mg dose (took 5 mg instead) Has had a cold the last week, taking dose differently than last recorded   Next Week Total 35 mg 35 mg 37.5 mg 37.5 mg 37.5 mg 37.5 mg 40 mg   Full Instructions 5 mg every day 5 mg every day 7.5 mg on Tue; 5 mg all other days 7.5 mg on Tue; 5 mg all other days 7.5 mg on Tue; 5 mg all other days 7.5 mg on Tue; 5 mg all other days 7.5 mg on Tue, Thu; 5 mg all other days   Date of Next INR 07/15/2016 06/24/2016 06/12/2016 05/13/2016 04/15/2016 03/26/2016 03/12/2016       ASSESSMENT    Therapeutic INR in stable patient and without complications.     PLAN    1.  Continue Warfarin 5 mg 7 days of the week and re check INR in 3 weeks.  .  As of 06/24/2016       Warfarin maintenance plan:   5 mg every day   Full warfarin instructions:   5 mg every day   Weekly warfarin total:   35 mg   No change documented:   Ardelle Park, RN   Next INR check:   07/15/2016         Anticoagulation Episode Summary     Comments:             2. Media Pizzini acknowledged understanding of this plan      PATIENT INSTRUCTIONS    June 2018 Details    Sun Mon Tue Wed Thu Fri Sat          1               2                 3  4               5               6               7      5  mg   See details      8      5 mg         9      5 mg           10      5 mg         11      5 mg         12      5 mg         13      5 mg         14      5 mg         15      5 mg         16      5 mg           17      5 mg         18      5 mg         19      5 mg         20      5 mg         21      5 mg         22      5 mg         23      5 mg           24      5 mg         25      5 mg         26      5 mg         27      5 mg         28      5 mg         29               30                Date Details   06/07 This INR check       Date of next INR:  07/15/2016

## 2016-07-07 ENCOUNTER — Ambulatory Visit (HOSPITAL_BASED_OUTPATIENT_CLINIC_OR_DEPARTMENT_OTHER): Payer: Medicare HMO

## 2016-07-07 ENCOUNTER — Encounter (HOSPITAL_BASED_OUTPATIENT_CLINIC_OR_DEPARTMENT_OTHER): Payer: Self-pay

## 2016-07-07 ENCOUNTER — Ambulatory Visit: Payer: Medicare HMO

## 2016-07-07 VITALS — BP 130/80 | HR 104 | Wt 315.8 lb

## 2016-07-07 DIAGNOSIS — I480 Paroxysmal atrial fibrillation: Secondary | ICD-10-CM | POA: Insufficient documentation

## 2016-07-07 DIAGNOSIS — Z6841 Body Mass Index (BMI) 40.0 and over, adult: Secondary | ICD-10-CM

## 2016-07-07 LAB — PR PROTHROMBIN TIME, ONSITE
prothrombin INR: 2.4
prothrombin INR: 2.4

## 2016-07-07 NOTE — Progress Notes (Signed)
Anticoag Treatment Plan  As of 07/07/2016    INR goal:   2.0-3.0   TTR:   86.1 % (2.8 y)   Today's INR:   2.4   Full warfarin instructions:   5 mg every day   Weekly warfarin total:   35 mg   Next INR check:   08/06/2016   Target end date:            Anticoagulation Episode Summary     INR check location:       Send INR reminders to:   [facility] EDM CLINICAL SUPPORT STAFF POOL    Comments:               SUBJECTIVE    Patient Findings     Negatives:   Signs/symptoms of thrombosis, Signs/symptoms of bleeding, Laboratory test error suspected, Change in health, Change in alcohol use, Change in activity, Upcoming invasive procedure, Emergency department visit, Upcoming dental procedure, Missed doses, Extra doses, Change in medications, Change in diet/appetite, Hospital admission, Bruising, Other complaints          OBJECTIVE    Anticoag Visit Date 07/07/2016 06/24/2016 06/10/2016 05/13/2016 04/15/2016 03/25/2016 03/12/2016   INR Goal 2.0-3.0 2.0-3.0 2.0-3.0 2.0-3.0 2.0-3.0 2.0-3.0 2.0-3.0   Selected INR 2.4 2.6 3.0 2.3 2.8 2.7 2.9   INR Date 07/07/2016 06/24/2016 06/10/2016 05/13/2016 04/15/2016 03/25/2016 03/12/2016   Previous Week Total 35 mg 35 mg 37.5 mg 37.5 mg 37.5 mg 37.5 mg 40 mg   Pt Deviation No No No No No No No   Pt Findings Comments - - Less greens intake - - - Missed a 7.5 mg dose (took 5 mg instead)   Next Week Total 35 mg 35 mg 35 mg 37.5 mg 37.5 mg 37.5 mg 37.5 mg   Full Instructions 5 mg every day 5 mg every day 5 mg every day 7.5 mg on Tue; 5 mg all other days 7.5 mg on Tue; 5 mg all other days 7.5 mg on Tue; 5 mg all other days 7.5 mg on Tue; 5 mg all other days   Date of Next INR 08/06/2016 07/15/2016 06/24/2016 06/12/2016 05/13/2016 04/15/2016 03/26/2016       ASSESSMENT    Therapeutic INR in stable patient and without complications.     PLAN    1.  Continue Warfarin 5 mg 7 days of the week and re check INR in 4 weeks.  .  As of 07/07/2016    Warfarin maintenance plan:   5 mg every day   Full warfarin instructions:   5 mg every day      Weekly warfarin total:   35 mg   Next INR check:   08/06/2016         Anticoagulation Episode Summary     Comments:             2. Caitlin Wiley acknowledged understanding of this plan      PATIENT INSTRUCTIONS    June 2018 Details    Sun Mon Tue Wed Thu Fri Sat          1               2                 3               4                5  6               7               8               9                 10               11               12               13               14               15               16                 17               18               19               20      5  mg   See details      21      5 mg         22      5 mg         23      5 mg           24      5 mg         25      5 mg         26      5 mg         27      5 mg         28      5 mg         29      5 mg         30      5 mg          Date Details   06/20 This INR check                 July 2018 Details    Sun Mon Tue Wed Thu Fri Sat     1      5 mg         2      5 mg         3      5 mg         4      5 mg         5      5 mg         6      5 mg         7      5 mg           8      5 mg         9      5 mg         10      5 mg         11  5 mg         12      5 mg         13      5 mg         14      5 mg           15      5 mg         16      5 mg         17      5 mg         18      5 mg         19      5 mg         20      5 mg         21                 22               23               24               25               26               27               28                 29               30               31                     Date Details   No additional details    Date of next INR:  08/06/2016

## 2016-07-07 NOTE — Progress Notes (Signed)
Cardiovascular Clinical Evaluation Note    Primary Care Provider: Corey Harold, MD    Referring Provider: No ref. provider found    DOB:  [birth year]    CHIEF COMPLAINT: Return of atrial fibrillation    HISTORY OF PRESENT ILLNESS: Caitlin Wiley went into atrial fibrillation on Monday.  ECG done today confirms H fibrillation with a rate of 104 and no ischemic change.  She is currently on warfarin therapy and sotalol.    I've arranged for her to have a cardioversion tomorrow at 8:30.    MEDICATIONS:    Current Outpatient Prescriptions   Medication Sig Dispense Refill   . Albuterol Sulfate HFA (VENTOLIN HFA) 108 (90 Base) MCG/ACT Inhalation Aero Soln Inhale 1-2 puffs by mouth every 6 hours as needed for shortness of breath/wheezing. 18 g 3   . Ascorbic Acid (VITAMIN C OR) 500 mg     . ASPIRIN OR 81 mg     . Flaxseed, Linseed, (FLAXSEED OIL) 1200 MG Oral Cap None Entered     . Fluticasone Propionate 50 MCG/ACT Nasal Suspension Spray 2 sprays into each nostril daily. 1 Inhaler 3   . Fluticasone Propionate HFA (FLOVENT HFA) 110 MCG/ACT Inhalation Aerosol Inhale 2 puffs by mouth 2 times a day. - RINSE MOUTH AFTER USE 1 Inhaler 5   . GLUCOSAMINE CHONDROITIN COMPLX OR      . Lansoprazole 30 MG Oral CAPSULE DELAYED RELEASE Take 1 capsule (30 mg) by mouth daily. (Patient not taking: Reported on 01/08/2016) 90 capsule 1   . Magnesium 400 MG Oral Cap Take 400 mg by mouth.     . Montelukast Sodium 10 MG Oral Tab Take 1 tablet (10 mg) by mouth daily. 34 tablet 11   . Multiple Vitamins-Minerals (MULTIVITAMIN OR) no iron     . Probiotic Product (PROBIOTIC DAILY OR)      . RaNITidine HCl 150 MG Oral Tab TAKE ONE TABLET BY MOUTH TWICE DAILY 180 tablet 1   . Sotalol HCl 80 MG Oral Tab Take 1 tablet (80 mg) by mouth 2 times a day. 180 tablet 3   . Triamterene-HCTZ 37.5-25 MG Oral Tab TAKE ONE TABLET BY MOUTH ONCE DAILY 90 tablet 1   . Warfarin Sodium (COUMADIN) 5 MG Oral Tab Take 7.5mg  X 1 day a week (Tu) and 5mg  X 6 days a week or as  instructed by your physician. 110 tablet 0     No current facility-administered medications for this visit.        ALLERGIES:  Bee venom; Cats [animals]; Ciprofloxacin; Dust mite extract; Pollen extract; and Sulfa antibiotics      PROBLEM LIST:  Patient Active Problem List    Diagnosis Date Noted   . Chronic anticoagulation [Z79.01] 08/02/2015   . Right cervical radiculopathy [M54.12] 05/30/2015   . Obesity [E66.9] 05/30/2015   . Paroxysmal atrial fibrillation ([place]) [I48.0] 04/17/2014   . GERD (gastroesophageal reflux disease) [K21.9] 11/08/2012   . Palpitations [R00.2] 08/21/2012   . OA (osteoarthritis) [M19.90] 07/28/2012   . Asthma [J45.909] 07/24/2012   . Hypercholesterolemia [E78.00] 07/24/2012   . Hx of colonic polyps--Dr Read--03/21/10  cleared for 5 years [Z86.010] 07/24/2012     Colonoscopy 01/2005     . Edema [R60.9] 07/24/2012   . Sleep apnea [G47.30] 07/24/2012   . HTN (hypertension) [I10] 07/24/2012   . Menopausal state [N95.1] 07/24/2012   . Rash [R21] 07/24/2012   . Pulmonary nodule [R91.1] 07/24/2012   . Grief reaction [F43.20] 07/24/2012  05/11/2011     . Strain of thoracic spine [S29.019A] 07/24/2012       REVIEW OF SYSTEMS   CONSTITUTIONAL:  No fever, weight loss.  EYES:  No visual loss.  ENT:  No hearing loss, exudate.  CARDIOVASCULAR:  See HPI.  GI:  No melena, hematochezia, hematemesis.  SKIN:  No lesions.  GU:  No dysuria, hematuria.  MUSCULOSKELETAL:  No myalgias, no arthralgias.  NEURO:  No TIA or CVA symptoms.  ALLERGIC/IMMUNOLOGIC:  No hives or rash.  PSYCHIATRIC:  No suicidal ideations.    PHYSICAL EXAM    CONSTITUTIONAL: This is a well-nourished female in no distress.    BP 130/80   Pulse (!) 104   Wt (!) 315 lb 12.8 oz (143.2 kg)   SpO2 95%   BMI 50.21 kg/m   NEURO/PSYCH:  Oriented x 3 with normal affect.  EYES:  Conjunctivae pink, sclerae clear.  E/N/T:  Oral mucosa pink, dentition normal.  NECK:  Jugular venous pressure normal.  Thyroid is not enlarged.  RESPIRATORY:  Respiratory  effort is normal.  Lungs are clear to percussion/auscultation.  CARDIOVASCULAR:  PMI at 5th ICS/MCL.  Irregular rhythm.  S1, S2 normal.  No S3, S4, no murmur.  Carotid upstrokes are brisk, without bruits.  Abdominal aorta normal to palpation, no bruit.  Femoral pulses normal, no bruits.  Pedal pulses normal bilaterally.  Extremities mild edema.  GASTROINTESTINAL:  Abdomen without masses or tenderness.  No hepatosplenomegaly.  EXTREMITIES:  No clubbing or cyanosis.  MUSCULOSKELETAL:  Normal gait and muscle strength.  SKIN:  Warm and dry, no lesions.    IMPRESSION: Paroxysmal atrial fibrillation on sotalol therapy.  Since she is therapeutically anticoagulated and is not converted on her own will recommend a cardioversion tomorrow morning.        [name], MD [facility]  07/07/2016  7:39 PM    Electronically transcribed by voice recognition technology.  Although every effort was made to ensure accuracy, some transcription errors may occur.

## 2016-07-08 ENCOUNTER — Encounter (HOSPITAL_BASED_OUTPATIENT_CLINIC_OR_DEPARTMENT_OTHER): Payer: Medicare HMO

## 2016-07-08 DIAGNOSIS — I480 Paroxysmal atrial fibrillation: Secondary | ICD-10-CM

## 2016-07-15 ENCOUNTER — Encounter (HOSPITAL_BASED_OUTPATIENT_CLINIC_OR_DEPARTMENT_OTHER): Payer: Medicare HMO

## 2016-07-15 ENCOUNTER — Ambulatory Visit: Payer: Medicare HMO

## 2016-07-15 ENCOUNTER — Encounter (HOSPITAL_BASED_OUTPATIENT_CLINIC_OR_DEPARTMENT_OTHER): Payer: Self-pay

## 2016-07-15 VITALS — BP 116/76 | HR 75 | Ht 66.5 in | Wt 319.8 lb

## 2016-07-15 DIAGNOSIS — I480 Paroxysmal atrial fibrillation: Secondary | ICD-10-CM | POA: Insufficient documentation

## 2016-07-15 DIAGNOSIS — Z6841 Body Mass Index (BMI) 40.0 and over, adult: Secondary | ICD-10-CM

## 2016-07-15 LAB — EKG 12 LEAD
Atrial Rate: 119 {beats}/min
Q-T Interval: 378 ms
QRS Duration: 88 ms
QTC Calculation: 497 ms
R Axis: 77 degrees
T Axis: -13 degrees
Ventricular Rate: 104 {beats}/min

## 2016-07-15 NOTE — Progress Notes (Signed)
Cardiovascular Clinical Evaluation Note    Primary Care Provider: Corey Harold, MD    Referring Provider: No ref. provider found    DOB:  12-05-1950    CHIEF COMPLAINT: Follow-up after cardioversion.    HISTORY OF PRESENT ILLNESS: She remains in sinus rhythm.  She's concerned that she's had another episode of A. fib this time within 9 months.  She remains on sotalol and Coumadin.  She would like to talk to Dr. Rene Paci about the possibility of A. fib ablation and I will arrange that appointment.    MEDICATIONS:    Current Outpatient Prescriptions   Medication Sig Dispense Refill   . Albuterol Sulfate HFA (VENTOLIN HFA) 108 (90 Base) MCG/ACT Inhalation Aero Soln Inhale 1-2 puffs by mouth every 6 hours as needed for shortness of breath/wheezing. 18 g 3   . Ascorbic Acid (VITAMIN C OR) 500 mg     . ASPIRIN OR 81 mg     . Flaxseed, Linseed, (FLAXSEED OIL) 1200 MG Oral Cap None Entered     . Fluticasone Propionate 50 MCG/ACT Nasal Suspension Spray 2 sprays into each nostril daily. 1 Inhaler 3   . Fluticasone Propionate HFA (FLOVENT HFA) 110 MCG/ACT Inhalation Aerosol Inhale 2 puffs by mouth 2 times a day. - RINSE MOUTH AFTER USE 1 Inhaler 5   . GLUCOSAMINE CHONDROITIN COMPLX OR      . Lansoprazole 30 MG Oral CAPSULE DELAYED RELEASE Take 1 capsule (30 mg) by mouth daily. (Patient not taking: Reported on 01/08/2016) 90 capsule 1   . Magnesium 400 MG Oral Cap Take 400 mg by mouth.     . Montelukast Sodium 10 MG Oral Tab Take 1 tablet (10 mg) by mouth daily. 34 tablet 11   . Multiple Vitamins-Minerals (MULTIVITAMIN OR) no iron     . Probiotic Product (PROBIOTIC DAILY OR)      . RaNITidine HCl 150 MG Oral Tab TAKE ONE TABLET BY MOUTH TWICE DAILY 180 tablet 1   . Sotalol HCl 80 MG Oral Tab Take 1 tablet (80 mg) by mouth 2 times a day. 180 tablet 3   . Triamterene-HCTZ 37.5-25 MG Oral Tab TAKE ONE TABLET BY MOUTH ONCE DAILY 90 tablet 1   . Warfarin Sodium (COUMADIN) 5 MG Oral Tab Take 7.5mg  X 1 day a week (Tu) and 5mg  X 6  days a week or as instructed by your physician. 110 tablet 0     No current facility-administered medications for this visit.        ALLERGIES:  Bee venom; Cats [animals]; Ciprofloxacin; Dust mite extract; Pollen extract; and Sulfa antibiotics      PROBLEM LIST:  Patient Active Problem List    Diagnosis Date Noted   . Chronic anticoagulation [Z79.01] 08/02/2015   . Right cervical radiculopathy [M54.12] 05/30/2015   . Obesity [E66.9] 05/30/2015   . Paroxysmal atrial fibrillation ([place]) [I48.0] 04/17/2014   . GERD (gastroesophageal reflux disease) [K21.9] 11/08/2012   . Palpitations [R00.2] 08/21/2012   . OA (osteoarthritis) [M19.90] 07/28/2012   . Asthma [J45.909] 07/24/2012   . Hypercholesterolemia [E78.00] 07/24/2012   . Hx of colonic polyps--Dr Read--03/21/10  cleared for 5 years [Z86.010] 07/24/2012     Colonoscopy 01/2005     . Edema [R60.9] 07/24/2012   . Sleep apnea [G47.30] 07/24/2012   . HTN (hypertension) [I10] 07/24/2012   . Menopausal state [N95.1] 07/24/2012   . Rash [R21] 07/24/2012   . Pulmonary nodule [R91.1] 07/24/2012   . Grief reaction [F43.20] 07/24/2012  05/11/2011     . Strain of thoracic spine [S29.019A] 07/24/2012       REVIEW OF SYSTEMS   CONSTITUTIONAL:  No fever, weight loss.  EYES:  No visual loss.  ENT:  No hearing loss, exudate.  CARDIOVASCULAR:  See HPI.  GI:  No melena, hematochezia, hematemesis.  SKIN:  No lesions.  GU:  No dysuria, hematuria.  MUSCULOSKELETAL:  No myalgias, no arthralgias.  NEURO:  No TIA or CVA symptoms.  ALLERGIC/IMMUNOLOGIC:  No hives or rash.  PSYCHIATRIC:  No suicidal ideations.    PHYSICAL EXAM    CONSTITUTIONAL: This is a well-nourished female in no distress.    BP 116/76   Pulse 75   Ht 5' 6.5" (1.689 m)   Wt (!) 319 lb 12.8 oz (145.1 kg)   SpO2 95%   BMI 50.84 kg/m   NEURO/PSYCH:  Oriented x 3 with normal affect.  EYES:  Conjunctivae pink, sclerae clear.  E/N/T:  Oral mucosa pink, dentition normal.  NECK:  Jugular venous pressure normal.  Thyroid is not  enlarged.  RESPIRATORY:  Respiratory effort is normal.  Lungs are clear to percussion/auscultation.  CARDIOVASCULAR:  PMI at 5th ICS/MCL.  Regular rhythm.  S1, S2 normal.  No S3, S4, no murmur.  Carotid upstrokes are brisk, without bruits.  Abdominal aorta normal to palpation, no bruit.  Femoral pulses normal, no bruits.  Pedal pulses normal bilaterally.  Extremities no edema.  GASTROINTESTINAL:  Abdomen without masses or tenderness.  No hepatosplenomegaly.  EXTREMITIES:  No clubbing or cyanosis.  MUSCULOSKELETAL:  Normal gait and muscle strength.  SKIN:  Warm and dry, no lesions.    IMPRESSION: Remains in sinus rhythm consider A. fib ablation therapy        [name], MD [facility]  07/15/2016  6:14 PM    Electronically transcribed by voice recognition technology.  Although every effort was made to ensure accuracy, some transcription errors may occur.

## 2016-07-19 LAB — EKG 12 LEAD
Atrial Rate: 59 {beats}/min
P Axis: 48 degrees
P-R Interval: 188 ms
Q-T Interval: 450 ms
QRS Duration: 92 ms
QTC Calculation: 445 ms
R Axis: 85 degrees
T Axis: 41 degrees
Ventricular Rate: 59 {beats}/min

## 2016-07-27 ENCOUNTER — Telehealth (INDEPENDENT_AMBULATORY_CARE_PROVIDER_SITE_OTHER): Payer: Self-pay

## 2016-07-27 NOTE — Telephone Encounter (Signed)
Outreach to patient to schedule Annual Wellness Visit with PCP.    Insurer: [employer]    Open Breast screen gap, no previous    Spoke with pt. Explained reason for call and purpose of the Annual Wellness Visit.   Patient scheduled for AWV with Dr Elizebeth Brooking on August 10 2016. at 945AM. Verified preferred contact phone number. All questions answered to patient's satisfaction. Encouraged patient to contact their PCP office with any further concerns or questions about their visit. Encouraged patient to call their insurer with any concerns or questions about insurance plan coverage. Provided designated phone number to the [place] if any changes need to be made to the appointment, [phone].  Closing Encounter.

## 2016-07-29 ENCOUNTER — Telehealth (INDEPENDENT_AMBULATORY_CARE_PROVIDER_SITE_OTHER): Payer: Self-pay

## 2016-07-29 DIAGNOSIS — N63 Unspecified lump in unspecified breast: Secondary | ICD-10-CM

## 2016-07-29 NOTE — Telephone Encounter (Signed)
Needs a mammogram referral per insurance company.    [facility]   [address], [place].  [phone]

## 2016-07-29 NOTE — Telephone Encounter (Signed)
Please sign for referral.Pt notified

## 2016-08-09 ENCOUNTER — Ambulatory Visit: Payer: Medicare HMO

## 2016-08-09 DIAGNOSIS — I480 Paroxysmal atrial fibrillation: Secondary | ICD-10-CM

## 2016-08-09 LAB — PR PROTHROMBIN TIME, ONSITE: prothrombin INR: 2.4

## 2016-08-09 NOTE — Progress Notes (Signed)
Anticoag Treatment Plan  As of 08/09/2016    INR goal:   2.0-3.0   TTR:   86.5 % (2.9 y)   Today's INR:   2.4   Full warfarin instructions:   5 mg every day   Weekly warfarin total:   35 mg   Next INR check:   09/09/2016   Target end date:            Anticoagulation Episode Summary     INR check location:       Send INR reminders to:   [facility] EDM CLINICAL SUPPORT STAFF POOL    Comments:               SUBJECTIVE    Patient Findings     Negatives:   Signs/symptoms of thrombosis, Signs/symptoms of bleeding, Laboratory test error suspected, Change in health, Change in alcohol use, Change in activity, Upcoming invasive procedure, Emergency department visit, Upcoming dental procedure, Missed doses, Extra doses, Change in medications, Change in diet/appetite, Hospital admission, Bruising, Other complaints          OBJECTIVE    Anticoag Visit Date 08/09/2016 07/07/2016 06/24/2016 06/10/2016 05/13/2016 04/15/2016 03/25/2016   INR Goal 2.0-3.0 2.0-3.0 2.0-3.0 2.0-3.0 2.0-3.0 2.0-3.0 2.0-3.0   Selected INR 2.4 2.4 2.6 3.0 2.3 2.8 2.7   INR Date 08/09/2016 07/07/2016 06/24/2016 06/10/2016 05/13/2016 04/15/2016 03/25/2016   Previous Week Total 35 mg 35 mg 35 mg 37.5 mg 37.5 mg 37.5 mg 37.5 mg   Pt Deviation No No No No No No No   Pt Findings Comments - - - Less greens intake - - -   Next Week Total 35 mg 35 mg 35 mg 35 mg 37.5 mg 37.5 mg 37.5 mg   Full Instructions 5 mg every day 5 mg every day 5 mg every day 5 mg every day 7.5 mg on Tue; 5 mg all other days 7.5 mg on Tue; 5 mg all other days 7.5 mg on Tue; 5 mg all other days   Date of Next INR 09/09/2016 08/06/2016 07/15/2016 06/24/2016 06/12/2016 05/13/2016 04/15/2016       ASSESSMENT    Therapeutic INR in stable patient and without complications.     PLAN    1.  Continue Warfarin 5 mg 7 days of the week and re check INR in 4 weeks.  .  As of 08/09/2016    Warfarin maintenance plan:   5 mg every day   Full warfarin instructions:   5 mg every day   Weekly warfarin total:   35 mg   Next INR check:    09/09/2016         Anticoagulation Episode Summary     Comments:             2. Caitlin Wiley acknowledged understanding of this plan      PATIENT INSTRUCTIONS    July 2018 Details    Sun Mon Tue Wed Thu Fri Sat     1               2               3               4               5                6  7                 8               9               10               11               12               13               14                 15               16               17               18               19               20               21                 22               23      5  mg   See details      24      5 mg         25      5 mg         26      5 mg         27      5 mg         28      5 mg           29      5 mg         30      5 mg         31      5 mg              Date Details   07/23 This INR check                 August 2018 Details    Sun Mon Tue Wed Thu Fri Sat        1      5 mg         2      5 mg         3      5 mg         4      5 mg           5      5 mg         6      5 mg         7      5 mg         8      5 mg         9      5 mg         10      5 mg  11      5 mg           12      5 mg         13      5 mg         14      5 mg         15      5 mg         16      5 mg         17      5 mg         18      5 mg           19      5 mg         20      5 mg         21      5 mg         22      5 mg         23      5 mg         24               25                 26               27               28               29               30               31                  Date Details   No additional details    Date of next INR:  09/09/2016

## 2016-08-10 ENCOUNTER — Ambulatory Visit: Payer: Medicare HMO

## 2016-08-10 ENCOUNTER — Encounter (INDEPENDENT_AMBULATORY_CARE_PROVIDER_SITE_OTHER): Payer: Self-pay

## 2016-08-10 ENCOUNTER — Ambulatory Visit (INDEPENDENT_AMBULATORY_CARE_PROVIDER_SITE_OTHER): Payer: Medicare HMO

## 2016-08-10 VITALS — BP 138/65 | HR 62 | Ht 65.25 in | Wt 318.0 lb

## 2016-08-10 DIAGNOSIS — J4520 Mild intermittent asthma, uncomplicated: Secondary | ICD-10-CM | POA: Insufficient documentation

## 2016-08-10 DIAGNOSIS — M170 Bilateral primary osteoarthritis of knee: Secondary | ICD-10-CM

## 2016-08-10 DIAGNOSIS — Z0000 Encounter for general adult medical examination without abnormal findings: Secondary | ICD-10-CM | POA: Insufficient documentation

## 2016-08-10 DIAGNOSIS — Z6841 Body Mass Index (BMI) 40.0 and over, adult: Secondary | ICD-10-CM

## 2016-08-10 DIAGNOSIS — Z780 Asymptomatic menopausal state: Secondary | ICD-10-CM

## 2016-08-10 DIAGNOSIS — I480 Paroxysmal atrial fibrillation: Secondary | ICD-10-CM

## 2016-08-10 DIAGNOSIS — K219 Gastro-esophageal reflux disease without esophagitis: Secondary | ICD-10-CM

## 2016-08-10 DIAGNOSIS — R911 Solitary pulmonary nodule: Secondary | ICD-10-CM

## 2016-08-10 DIAGNOSIS — I10 Essential (primary) hypertension: Secondary | ICD-10-CM | POA: Insufficient documentation

## 2016-08-10 LAB — COMPREHENSIVE METABOLIC PANEL
ALT (GPT): 15 U/L (ref 7–33)
AST (GOT): 18 U/L (ref 9–38)
Albumin: 4.2 g/dL (ref 3.5–5.2)
Alkaline Phosphatase (Total): 88 U/L (ref 38–172)
Anion Gap: 9 (ref 4–12)
Bilirubin (Total): 0.8 mg/dL (ref 0.2–1.3)
Calcium: 9.2 mg/dL (ref 8.9–10.2)
Carbon Dioxide, Total: 29 meq/L (ref 22–32)
Chloride: 100 meq/L (ref 98–108)
Creatinine: 0.98 mg/dL (ref 0.38–1.02)
GFR, Calc, African American: >60 mL/min/{1.73_m2} (ref >59)
GFR, Calc, European American: 57 mL/min/{1.73_m2} — ABNORMAL LOW (ref >59)
Glucose: 111 mg/dL (ref 62–125)
Potassium: 3.7 meq/L (ref 3.6–5.2)
Protein (Total): 6.9 g/dL (ref 6.0–8.2)
Sodium: 138 meq/L (ref 135–145)
Urea Nitrogen: 15 mg/dL (ref 8–21)

## 2016-08-10 LAB — LIPID PANEL
Cholesterol (LDL): 125 mg/dL (ref <130)
Cholesterol/HDL Ratio: 3.3
HDL Cholesterol: 65 mg/dL (ref >39)
Non-HDL Cholesterol: 152 mg/dL (ref 0–159)
Total Cholesterol: 217 mg/dL — ABNORMAL HIGH (ref <200)
Triglyceride: 137 mg/dL (ref <150)

## 2016-08-10 LAB — CBC, DIFF
% Basophils: 1 %
% Eosinophils: 3 %
% Immature Granulocytes: 0 %
% Lymphocytes: 20 %
% Monocytes: 6 %
% Neutrophils: 70 %
% Nucleated RBC: 0 %
Absolute Eosinophil Count: 0.31 10*3/uL (ref 0.00–0.50)
Absolute Lymphocyte Count: 2.17 10*3/uL (ref 1.00–4.80)
Basophils: 0.11 10*3/uL (ref 0.00–0.20)
Hematocrit: 47 % — ABNORMAL HIGH (ref 36–45)
Hemoglobin: 15.6 g/dL — ABNORMAL HIGH (ref 11.5–15.5)
Immature Granulocytes: 0.04 10*3/uL (ref 0.00–0.05)
MCH: 28.2 pg (ref 27.3–33.6)
MCHC: 33.3 g/dL (ref 32.2–36.5)
MCV: 85 fL (ref 81–98)
Monocytes: 0.65 10*3/uL (ref 0.00–0.80)
Neutrophils: 7.52 10*3/uL — ABNORMAL HIGH (ref 1.80–7.00)
Nucleated RBC: 0 10*3/uL
Platelet Count: 369 10*3/uL (ref 150–400)
RBC: 5.53 10*6/uL — ABNORMAL HIGH (ref 3.80–5.00)
RDW-CV: 14.4 % (ref 11.6–14.4)
WBC: 10.8 10*3/uL — ABNORMAL HIGH (ref 4.3–10.0)

## 2016-08-10 LAB — THYROID STIMULATING HORMONE: Thyroid Stimulating Hormone: 3.638 u[IU]/mL (ref 0.400–5.000)

## 2016-08-10 NOTE — Patient Instructions (Addendum)
COUNSELING:     Based on today's evaluation, I recommend the following ways to improve your health or functioning: personalized health advice      You have the following risk factors and/or medical conditions for which there are recommended ways (included in the list) to help you stay as healthy as possible:  1. Continue taking Triamteren-HCTZ 37.5-25mg  daily for management of your high blood pressure.   2. Continue taking Sotalol HCl 80mg  and Warfarin daily for your atrial fibrillation.   3. I recommend getting the new Shingrex vaccination.     Here are measures that our medical record shows may be recommended for you for screening & prevention, and when each is recommended:   Health Maintenance   Topic Date Due   . Cervical Cancer Screening (Pap Smear)  03/02/1972   . Pneumococcal Vaccine (1 of 2 - PCV13) 03/02/2016   . Breast Cancer Screening  05/26/2016   . Zoster Vaccine  01/05/2017 (Originally 03/03/2011)   . Influenza Vaccine (1) 10/18/2016   . Osteoporosis Screening  04/29/2017   . Depression Screening (PHQ-2)  08/10/2017   . Diabetes Screening  01/06/2019   . Tetanus Vaccine  03/13/2020   . Lipid Disorders Screening  01/05/2021   . Colorectal Cancer Screening (Colonoscopy)  04/02/2021   . Hepatitis C Screening  Completed   . HIV Screening  Completed       The screening & preventive measures marked with a check in the list below are the ones that I recommend for you:     Immunizations  X Pneumococcal vaccine  _ Influenza vaccine  _ Hepatitis B vaccine  _ Tetanus vaccine (at community pharmacy)  X Shingles vaccine (at community pharmacy)    Screening lab tests  X Cardiovascular screening (lipid panel)  X Diabetes screening (blood sugar)  _ Hepatitis C screening  _ Prostate serum antigen (PSA)  _ Stool testing for colorectal cancer screening   _ Pap smear and pelvic exam    Imaging  _ AAA screening ultrasound  _ Mammogram for screening  _ Bone density measurement  _ Lung cancer screening CT scan (counseling  discussion required 1st)     Referrals  _ Colonoscopy referral for colorectal cancer screening   _ Glaucoma screening (eye exam)  _ Nutrition counseling (covered for people with diabetes, kidney disease, or within 3 years of a kidney transplant)      Please plan to have a Subsequent Annual Wellness Visit next year

## 2016-08-10 NOTE — Progress Notes (Signed)
I, Ricki Miller, MD interviewed and examined the patient while overseeing the documentation performed by my Medical Scribe, Anders Grant. I have reviewed and revised as necessary the scribe's note and agree with the documented findings and plan of care. Please refer to the scribe's note below for further detailed information regarding the patient encounter and exam.  08/10/2016. 11:12 AM.

## 2016-08-10 NOTE — Progress Notes (Signed)
ANNUAL WELLNESS VISIT     Caitlin Wiley presents for a first [employer].    Rooming activities:  Height and weight measured: YES  PHQ-2 responses entered into screening section: YES  PHQ-9 given if PHQ-2 score is >0: YES         HEALTH RISK ASSESSMENT:     Current providers and suppliers  X See Care Team Section  X See also EHR Encounters for [facility] Providers involved in care  X Other providers and suppliers outside [facility]: cardiologist, Dr. Ivan Croft     SELF ASSESSMENT OF HEALTH:  How do you rate your overall health in the past 4 weeks?   fair  Can you manage your health problems?  YES  Due to any health problems, do you need the help of another person with your personal care needs such as eating, bathing,  dressing or getting around the house?  NO      DEPRESSION SCREEN and PSYCHOSOCIAL HEALTH:    PHQ-2 Total Score:  PHQ2 Total Score: 0      Have your feelings caused you distress or interfered with your ability to get along socially with family or friends? not at all   In the past 2 weeks, have you felt stress over health, finances, relationships or work?  several days  Do you often get the emotional support you need? several days  In the past 2 weeks, how much body pain do you have? nearly every day  In the past 2 weeks, how much fatigue do you have? several days      HEALTH AND HABITS:  How much alcohol do you drink weekly?  I did not drink in the past year  Is your diet well-balanced/healthy, including protein, fiber, vegetables, and fruits? YES   Do you exercise regularly? NO        Type of exercise no regular exercise         Frequency of exercise - N/A  Do you always use your seat belt in the car? YES  How would you describe the condition of your mouth and teeth ? including false teeth or dentures?  good  Are you sexually active? NO  Do you find yourself having trouble hearing people speak?  No  Do you wear a hearing aid/device? No  Do you have a fire extinguisher in your home?   YES  Do you have a smoke detector?  YES        ACTIVITIES OF DAILY LIVING:  In your present state of health how much difficulty do you have with the following activities:       Preparing food and eating: 0 No impairment (the person has no problem)       Bathing yourself: 0 No impairment (the person has no problem)       Getting dressed: 0 No impairment (the person has no problem)       Using the toilet: 0 No impairment (the person has no problem)       Moving around from place to place: 1 Mild impairment (problem < 25% time, intensity tolerable, happens rarely over the last 30 days.)    In the past year have you fallen or had a near fall? Yes  Do you feel safe in your home environment? YES     INSTRUMENTAL ACTIVITIES OF DAILY LIVING:  In your present state of health how much difficulty do you have with the following activities?  Shopping: 1 Mild impairment (problem < 25% time, intensity tolerable, happens rarely over the last 30 days.)       Using the telephone: 0 No impairment (the person has no problem)       Housekeeping: 2 Moderate impairment (problem < 50% time, intensity interfers in day to day life, happens occasionally over the last 30 days.)       Laundry: 1 Mild impairment (problem < 25% time, intensity tolerable, happens rarely over the last 30 days.)       Driving or using transportation (bus, taxi): 0 No impairment (the person has no problem)       Handling your own finances: 0 No impairment (the person has no problem)       Managing your own medications: 0 No impairment (the person has no problem)     SIGNS OF COGNITIVE IMPAIRMENT:   Patient report? No  Concerns raised by family members, friends, caretakers or others? No     CARDIAC RISK FACTORS:  Smoker: No  Obesity: Yes  Diabetic: No  Known heart disease: No  Family history of heart disease: Yes  Sedentary lifestyle: Yes  Hyperlipidemia: Yes     Patient's questionnaire responses copied into template above by [facility]  VISIT    Caitlin Wiley is a 66 year old female who presents for a first Annual Wellness Visit.    INFORMATION GATHERING:  X  I have reviewed and updated the patient's past medical history, past surgical history, family history and social history in Epic     The information checked in the list below is up to date at the end of this visit:   X  Current medications and supplements (including vitamins and calcium)  X  Allergies  X  Health Risk Assessment completed by or with the patient for this visit, including Current Providers; Functional ability, safety, fall risk, & hearing loss; diet, physical activity, and health habits; Depression screening; Patient report of cognitive function via self-report & report of any concerns raised by others    The patient's Health Risk Assessment for today's visit is:   X transcribed into this encounter  _ scanned into EHR (Media section of Epic)     Race and ethnicity (from Demographics): White Not Hispanic or Latino     PHQ-2: PHQ2 Total Score: 0  PHQ-9:       EXAM:  Vitals: BP 138/65   Pulse 62   Ht 5' 5.25" (1.657 m)   Wt (!) 318 lb (144.2 kg)   BMI 52.51 kg/m    Body mass index is 52.51 kg/m.   GENERAL: no acute distress  GAIT: normal      COGNITION: baseline        ADVANCE CARE PLANNING  End-of-life planning offered, patient declined        ASSESSMENT:  Caitlin Wiley was seen for a first Annual Wellness Visit, including identification of risk factors & conditions that may affect her health and function in the future.    COUNSELING:     Based on today's evaluation, I recommend the following ways to improve your health or functioning: personalized health advice      You have the following risk factors and/or medical conditions for which there are recommended ways (included in the list) to help you stay as healthy as possible:  1. Continue taking Triamteren-HCTZ 37.5-25mg  daily for management of your high blood pressure.   2. Continue taking Sotalol HCl  80mg   and Warfarin daily for your atrial fibrillation.   3. I recommend the new Shingrex vaccination.     Here are measures that our medical record shows may be recommended for you for screening & prevention, and when each is recommended:   Health Maintenance   Topic Date Due   . Cervical Cancer Screening (Pap Smear)  03/02/1972   . Pneumococcal Vaccine (1 of 2 - PCV13) 03/02/2016   . Breast Cancer Screening  05/26/2016   . Zoster Vaccine  01/05/2017 (Originally 03/03/2011)   . Influenza Vaccine (1) 10/18/2016   . Osteoporosis Screening  04/29/2017   . Depression Screening (PHQ-2)  08/10/2017   . Diabetes Screening  01/06/2019   . Tetanus Vaccine  03/13/2020   . Lipid Disorders Screening  01/05/2021   . Colorectal Cancer Screening (Colonoscopy)  04/02/2021   . Hepatitis C Screening  Completed   . HIV Screening  Completed       The screening & preventive measures marked with a check in the list below are the ones that I recommend for you:     Immunizations  X Pneumococcal vaccine  _ Influenza vaccine  _ Hepatitis B vaccine  _ Tetanus vaccine (at community pharmacy)  X Shingles vaccine (at community pharmacy)    Screening lab tests  X Cardiovascular screening (lipid panel)  X Diabetes screening (blood sugar)  _ Hepatitis C screening  _ Prostate serum antigen (PSA)  _ Stool testing for colorectal cancer screening   _ Pap smear and pelvic exam    Imaging  _ AAA screening ultrasound  _ Mammogram for screening  _ Bone density measurement  _ Lung cancer screening CT scan (counseling discussion required 1st)     Referrals  _ Colonoscopy referral for colorectal cancer screening   _ Glaucoma screening (eye exam)  _ Nutrition counseling (covered for people with diabetes, kidney disease, or within 3 years of a kidney transplant)      Please plan to have a Subsequent Annual Wellness Visit next year       DATE: 08/10/2016     PHYSICIAN NOTE:  Patient Name: Caitlin Wiley  Visit Date/time:  08/10/2016     Date of Birth: July 10, 1950    CHIEF COMPLAINT:     Caitlin Wiley is a 66 year old female who is here today for review of multiple medical problems, and for a wellness exam.    PROBLEMS TO BE ADDRESSED ON TODAYS VISIT:  (Z00.00) Routine general medical examination at a health care facility  (primary encounter diagnosis)  (I48.0) Paroxysmal atrial fibrillation (HCC)  (J45.20) Mild intermittent asthma without complication  (V40) Essential hypertension  (K21.9) Gastroesophageal reflux disease without esophagitis  (R91.1) Pulmonary nodule  (Z78.0) Postmenopausal  (Z23) Need for prophylactic vaccination against Streptococcus pneumoniae (pneumococcus)  (M17.0) Primary osteoarthritis of both knees    HPI:  History provided by patient.  The patient is fasting.     Allecia Bells is a 66 year old female who is here today for review of multiple medical problems as reported above, and for a wellness exam.    The patient complains of bilateral knee pain due to osteoarthritis. She was seen by orthopedist, Dr. Brock Ra, who wanted the patient to lose 50 pounds before he would operate on her. She has had a difficult time losing this weight. The patient is requesting to have a handicap parking sticker.     The patient has a history of  atrial fibrillation and reports having an episode in June. She underwent a cardioversion and was recommended to have an ablation procedure with Dr. Rene Paci. She is requesting a referral for same.     The patient notes some depression, which she believes may be due to one of her medications. She declines trying an antidepressant and hopes to get off of Sotalol after her ablation procedure.     The patient otherwise reports having balance difficulty with some associated dizziness with changes in position.     The patient had a routine pap smear in December of 2017 with OB.     PROBLEM LIST:  Patient Active Problem List   Diagnosis   . Asthma   . Hypercholesterolemia   . Hx of  colonic polyps--Dr Read--03/21/10  cleared for 5 years   . Edema   . Sleep apnea   . HTN (hypertension)   . Menopausal state   . Rash   . Pulmonary nodule   . Grief reaction   . Strain of thoracic spine   . OA (osteoarthritis)   . GERD (gastroesophageal reflux disease)   . Palpitations   . Paroxysmal atrial fibrillation (HCC)   . Right cervical radiculopathy   . Obesity   . Chronic anticoagulation       SURGICAL HISTORY:  Past Surgical History:   Procedure Laterality Date   . COLONOSCOPY FLX DX W/COLLJ SPEC WHEN PFRMD  11/2005   . SURGICAL HX OTHER  08/1999    knee surgery       MEDICATIONS:  Current Outpatient Prescriptions   Medication Sig Dispense Refill   . Albuterol Sulfate HFA (VENTOLIN HFA) 108 (90 Base) MCG/ACT Inhalation Aero Soln Inhale 1-2 puffs by mouth every 6 hours as needed for shortness of breath/wheezing. 18 g 3   . Ascorbic Acid (VITAMIN C OR) 500 mg     . ASPIRIN OR 81 mg     . Flaxseed, Linseed, (FLAXSEED OIL) 1200 MG Oral Cap None Entered     . Fluticasone Propionate 50 MCG/ACT Nasal Suspension Spray 2 sprays into each nostril daily. 1 Inhaler 3   . Fluticasone Propionate HFA (FLOVENT HFA) 110 MCG/ACT Inhalation Aerosol Inhale 2 puffs by mouth 2 times a day. - RINSE MOUTH AFTER USE 1 Inhaler 5   . GLUCOSAMINE CHONDROITIN COMPLX OR      . Lansoprazole 30 MG Oral CAPSULE DELAYED RELEASE Take 1 capsule (30 mg) by mouth daily. (Patient not taking: Reported on 01/08/2016) 90 capsule 1   . Magnesium 400 MG Oral Cap Take 400 mg by mouth.     . Montelukast Sodium 10 MG Oral Tab Take 1 tablet (10 mg) by mouth daily. 34 tablet 11   . Multiple Vitamins-Minerals (MULTIVITAMIN OR) no iron     . Probiotic Product (PROBIOTIC DAILY OR)      . RaNITidine HCl 150 MG Oral Tab TAKE ONE TABLET BY MOUTH TWICE DAILY 180 tablet 1   . Sotalol HCl 80 MG Oral Tab Take 1 tablet (80 mg) by mouth 2 times a day. 180 tablet 3   . Triamterene-HCTZ 37.5-25 MG Oral Tab TAKE ONE TABLET BY MOUTH ONCE DAILY 90 tablet 1   . Warfarin  Sodium (COUMADIN) 5 MG Oral Tab Take 7.5mg  X 1 day a week (Tu) and 5mg  X 6 days a week or as instructed by your physician. 110 tablet 0     No current facility-administered medications for this visit.        I,  Ricki Miller MD, have reviewed the medication list in its entirety and agree with all contents.     ALLERGIES:  Review of patient's allergies indicates:  Allergies   Allergen Reactions   . Bee Venom    . Cats [Animals]    . Ciprofloxacin      Joint aching   . Dust Mite Extract      DUST   . Pollen Extract      POLLEN   . Sulfa Antibiotics        FAMILY HISTORY:  Family History     Problem (# of Occurrences) Relation (Name,Age of Onset)    Anesthesia Problems (1) Mother    Arthritis (3) Mother, Father, Other: grandmother    Asthma (1) Other: and/or allergies - grandfather    Breast Cancer (2) Mother: & tongue cancer, Other: grandmother    Cancer (2) Father, Other: grandparents    Colon Polyps (1) Father    Diabetes (1) Father    Heart Disease (3) Father: torn valve 01/1999, Maternal Grandfather, Paternal Grandfather    Hypertension (2) Mother, Father    Lupus (1) Aunt/Uncle: great-aunt    Mental Illness (2) Mother, Brother    Stroke (2) Mother: possible, Father    Tuberculosis (1) Other: great-grandmother          SOCIAL HISTORY:  Social History     Social History   . Marital status: N/A     Spouse name: N/A   . Number of children: 0   . Years of education: N/A     Occupational History   . Not on file.     Social History Main Topics   . Smoking status: Never Smoker   . Smokeless tobacco: Never Used   . Alcohol use No      Comment: rare    . Drug use: No   . Sexual activity: No     Other Topics Concern   . 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 Rare exercise.         DXA completed on 07/31/12, resulting in normal bone density of the lumbar spine, and normal bone density at the L hip; ordered 04/30/15        Mammogram performed on 05/27/14,  resulting in no mammographic features of malignancy         Colonoscopy performed on 04/03/14 with Dr. Estill Bamberg, resulting in moderate diverticulosis of the left side of the colon, long redundant colon, cleared for seven years         TDAP- 03/13/10    Zostavax-advised 04/30/15        PAP-monitored by Dr. Harmon Pier        HIV screening, Hepatitis C screening-done in 2014       REVIEW OF SYSTEMS:  Female  CONSTITUTIONAL: Denies, fatigue, unexpected weight loss, unexpected weight gain  NEUROLOGIC: Reports dizziness and balance difficulty   OPHTHALMIC: Denies, blurry vision, diplopia, eye pain  ENT: Denies, hearing difficulties, nasal congestion and any ear nose or throat problems  RESPIRATORY: H/o asthma   CARDIOVASCULAR: H/o HTN, atrial fibrillation, palpitations   GI: H/o GERD  BREAST: Denies, breast pain, breast nipple discharge, history of abnormal mammogram, history of breast mass  GENITOURINARY: Denies, menstrual problems including irregular, heavy or painful menses, prior abnormal pap smear, dysuria, hematuria, urinary incontinence, urinary urgency, vaginal discharge, abnormal vaginal bleeding, pelvic pain  INTEGUMENTARY: Denies, skin rash, easy bruising, hair loss, changes in moles or new moles  RHEUMATOLOGIC/MSK: H/o osteoarthritis, strain of thoracic spine, right cervical radiculopathy   ENDOCRINE: H/o hypercholesterolemia   HEME/LYMPH:  Denies, Easy bruising, Lumps under arms or around neck and Anemia  PSYCHOSOCIAL: Reports depression    PHYSICAL EXAM:  BP 138/65   Pulse 62   Ht 5' 5.25" (1.657 m)   Wt (!) 318 lb (144.2 kg)   BMI 52.51 kg/m   Physical Examination-Female: Vital Signs as noted above.      General Appearance and Mental Status: Well Developed/Well Nourished  [ ]  Slender  [ ]  Average  [ ]  Heavy [x ] Obese  [x ] Appropriate Affect, Oriented to Person, Place, Time      Skin: Warm, dry to touch    Head: Normocephalic, atraumatic    Eyes: EOMI, PERLA    Ears: TM's clear bilaterally    Nose: Septum intact,  no lesions, pink mucosa and turbinates, no discharge    Pharynx: Clear    Neck: Supple, without JVD  or goiter    Chest: Clear     CV: Heart: RRR, no murmur, no rub, no S3, S4 noted    Breasts: no or slight asymetry,soft,non tender,glandular tissue felt, no discharge, no dimpling, no dominate lesions noted  A female, Kellie Shropshire, was present during this part of the physical exam.     Rectal:  deferred    Abdomen: Soft, bowel sounds present, without masses or tenderness noted, no hepatosplenomegaly noted.    Extrem: no edema    MSK:  crepitation of both knees     Neuro: intact         LAB RESULTS:  Pending.     ASSESSMENT AND PLAN:  Pertinent Labs & Imaging studies reviewed. (See chart for details)  Prior EMR records reviewed in EPIC as available and clinically relevant.    1. Routine general medical examination at a health care facility  I performed a routine general medical examination today and ordered routine fasting labs including TSH for further evaluation.   - CBC, DIFF  - COMPREHENSIVE METABOLIC PANEL  - LIPID PANEL  - THYROID STIMULATING HORMONE    2. Paroxysmal atrial fibrillation [facility])  Patient has a h/o paroxysmal atrial fibrillation and is currently taking Sotalol HCl 80mg  twice daily. She is anticoagulated with Warfarin. This is monitored by Dr. Ivan Croft. The patient reports having an episode in June. She underwent a cardioversion and was recommended to have an ablation procedure with Dr. Rene Paci. A referral was given for same.   - REFERRAL TO CARDIOLOGY    3. Mild intermittent asthma without complication  Patient has a h/o asthma and is currently taking Montelukast 10mg  daily in addition to using a Flovent inhaler, albuterol inhaler, and Flonase nasal suspension.    4. Essential hypertension  Patient has a h/o HTN and is currently taking Triamteren-HCTZ 37.5-25mg  daily. Blood pressure today is 142/65. I ordered a CBC and CMP for further evaluation.    5. Gastroesophageal reflux disease without  esophagitis  Patient has a h/o GERD and is currently taking Ranitidine 150mg  twice daily.     6. Pulmonary nodule  Stable.     7. Postmenopausal  Stable.     8. Need for prophylactic vaccination against Streptococcus pneumoniae (pneumococcus)  Prevnar 13 vaccine given today IM by MA.       9. Primary osteoarthritis of both knees  patient complains of bilateral knee pain due to osteoarthritis.  She was seen by orthopedist, Dr. Brock Ra, who wanted the patient to lose 50 pounds before he would operate on her. She has had a difficult time losing this weight. The patient is requesting to have a handicap parking sticker, therefore the appropriate paperwork was completed. On examination, there was some crepitation in the knees bilaterally.       Further treatment dependant on the results of the tests.    Seek immediate attention if symptoms are worsening.    Call if symptoms are not resolving with therapy.      HEALTH MAINTENANCE:   Social History     Social History   . Marital status: N/A     Spouse name: N/A   . Number of children: 0   . Years of education: N/A     Occupational History   . Not on file.     Social History Main Topics   . Smoking status: Never Smoker   . Smokeless tobacco: Never Used   . Alcohol use No      Comment: rare    . Drug use: No   . Sexual activity: No     Other Topics Concern   . 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 Rare exercise.         DXA completed on 07/31/12, resulting in normal bone density of the lumbar spine, and normal bone density at the L hip; ordered 04/30/15        Mammogram performed on 05/27/14, resulting in no mammographic features of malignancy         Colonoscopy performed on 04/03/14 with Dr. Estill Bamberg, resulting in moderate diverticulosis of the left side of the colon, long redundant colon, cleared for seven years         TDAP- 03/13/10    Zostavax-advised 04/30/15        PAP-monitored by Dr. Harmon Pier        HIV  screening, Hepatitis C screening-done in 2014         Portions of this chart were written by Anders Grant, Medical Scribe with oversight by Ricki Miller, MD 08/10/2016 9:49 AM    I, Ricki Miller, have reviewed the information recorded by the scribe for accuracy and agree with its contents.    This medical record has been electronically generated with voice recognition software. Every attempt is made to assure accuracy.   However. it may contain errors related to this process.

## 2016-08-17 ENCOUNTER — Other Ambulatory Visit (INDEPENDENT_AMBULATORY_CARE_PROVIDER_SITE_OTHER): Payer: Self-pay

## 2016-08-17 ENCOUNTER — Other Ambulatory Visit (HOSPITAL_BASED_OUTPATIENT_CLINIC_OR_DEPARTMENT_OTHER): Payer: Self-pay

## 2016-08-17 DIAGNOSIS — K219 Gastro-esophageal reflux disease without esophagitis: Secondary | ICD-10-CM

## 2016-08-17 DIAGNOSIS — I480 Paroxysmal atrial fibrillation: Secondary | ICD-10-CM

## 2016-08-17 NOTE — Telephone Encounter (Signed)
Refill request received from [facility] for [facility]. Routing to Dr Ivan Croft for approval and signature.    Sallye Ober RN

## 2016-08-18 MED ORDER — RANITIDINE HCL 150 MG OR TABS
150.0000 mg | ORAL_TABLET | Freq: Two times a day (BID) | ORAL | 3 refills | Status: DC
Start: 2016-08-18 — End: 2017-08-23

## 2016-08-19 MED ORDER — WARFARIN SODIUM 5 MG OR TABS
ORAL_TABLET | ORAL | 2 refills | Status: DC
Start: 2016-08-19 — End: 2017-05-20

## 2016-08-20 ENCOUNTER — Encounter (INDEPENDENT_AMBULATORY_CARE_PROVIDER_SITE_OTHER): Payer: Self-pay

## 2016-08-20 NOTE — Telephone Encounter (Signed)
Patient states she is not taking lansoprazole, just ranitidine. Med list updated. FYI to provider.

## 2016-08-26 ENCOUNTER — Encounter (INDEPENDENT_AMBULATORY_CARE_PROVIDER_SITE_OTHER): Payer: Self-pay

## 2016-08-26 NOTE — Telephone Encounter (Signed)
To covering provider to advise. Patient c/o reaction to PCV 13 vaccination.

## 2016-08-27 IMAGING — DX CHEST PA AND LATERAL
1 series · 2 of 2 positions shown · non-contrast
Comparison: none

CLINICAL INDICATION:  Dyspnea. Shortness of breath. Chest discomfort for 2 
months. History of right breast cancer. Smoking cessation years prior. 
COMPARISON EXAMINATIONS: None prior of the chest.

[Series 1: PA · U · 0.14mm/px · 2 of 2 slices shown]
[im 1/2]
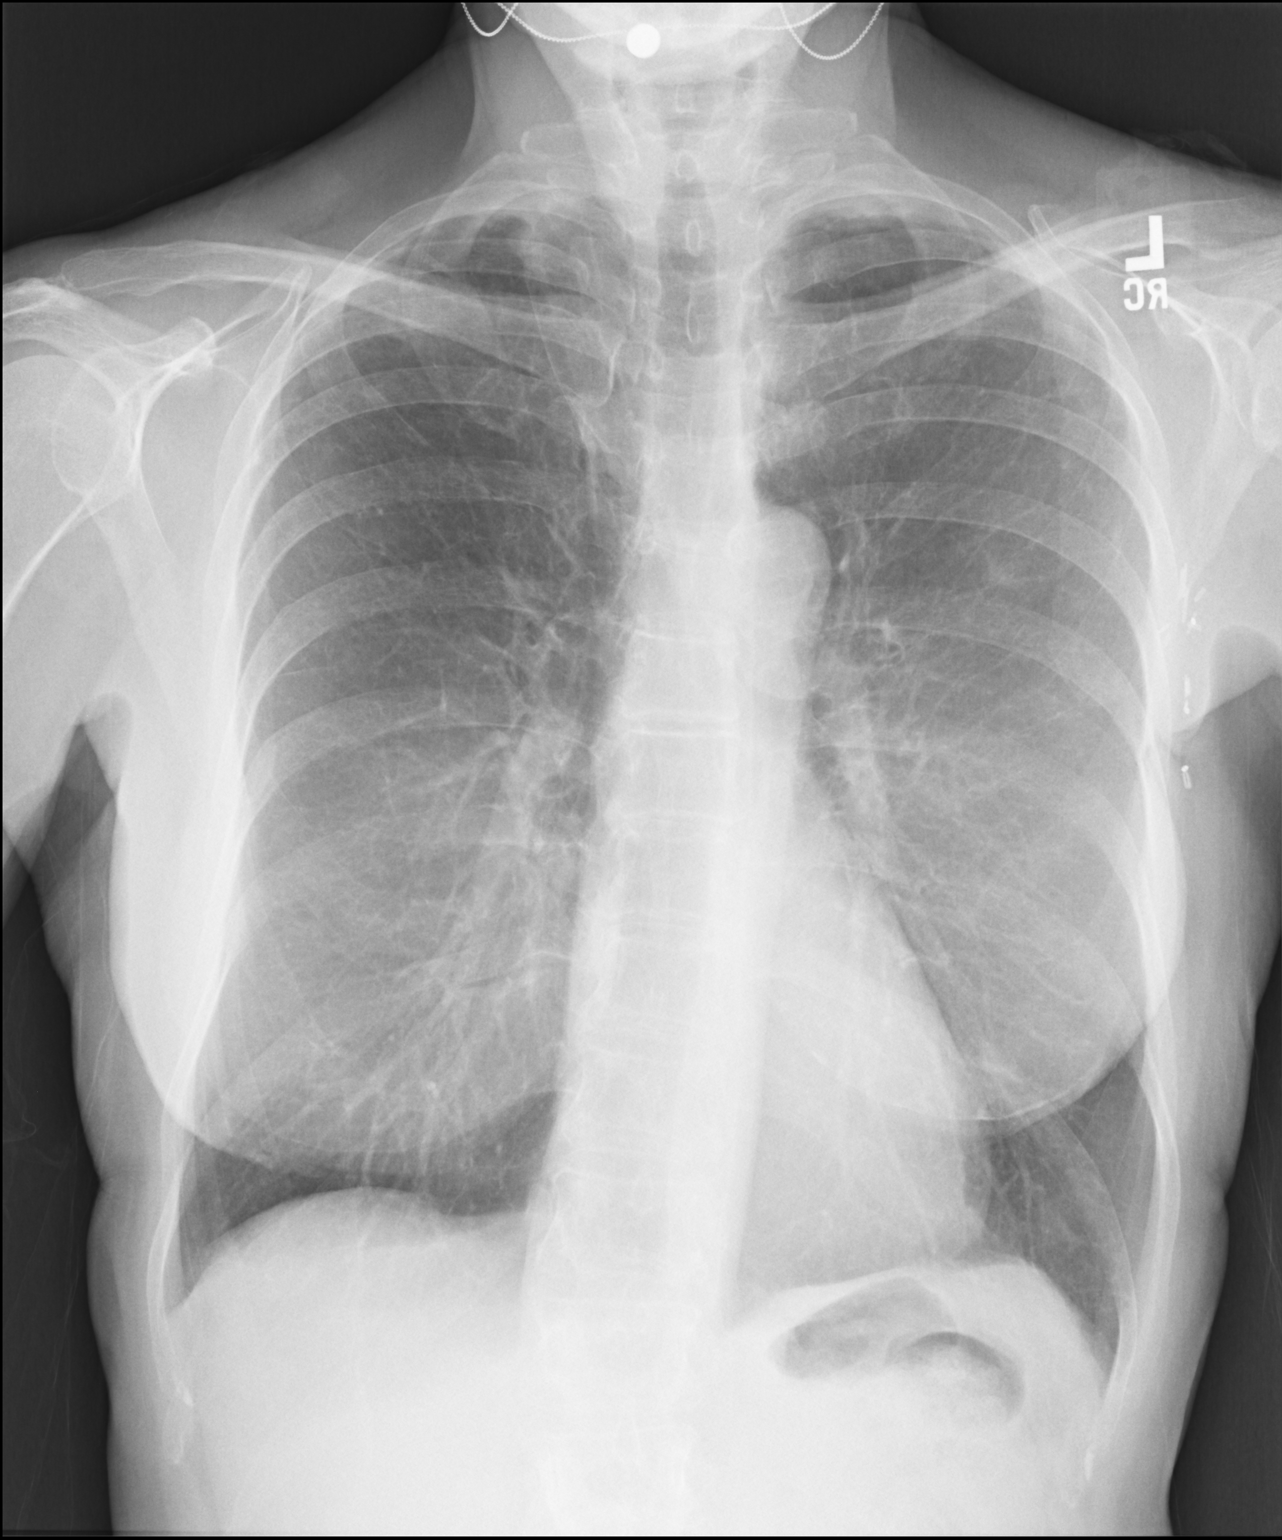
[im 2/2]
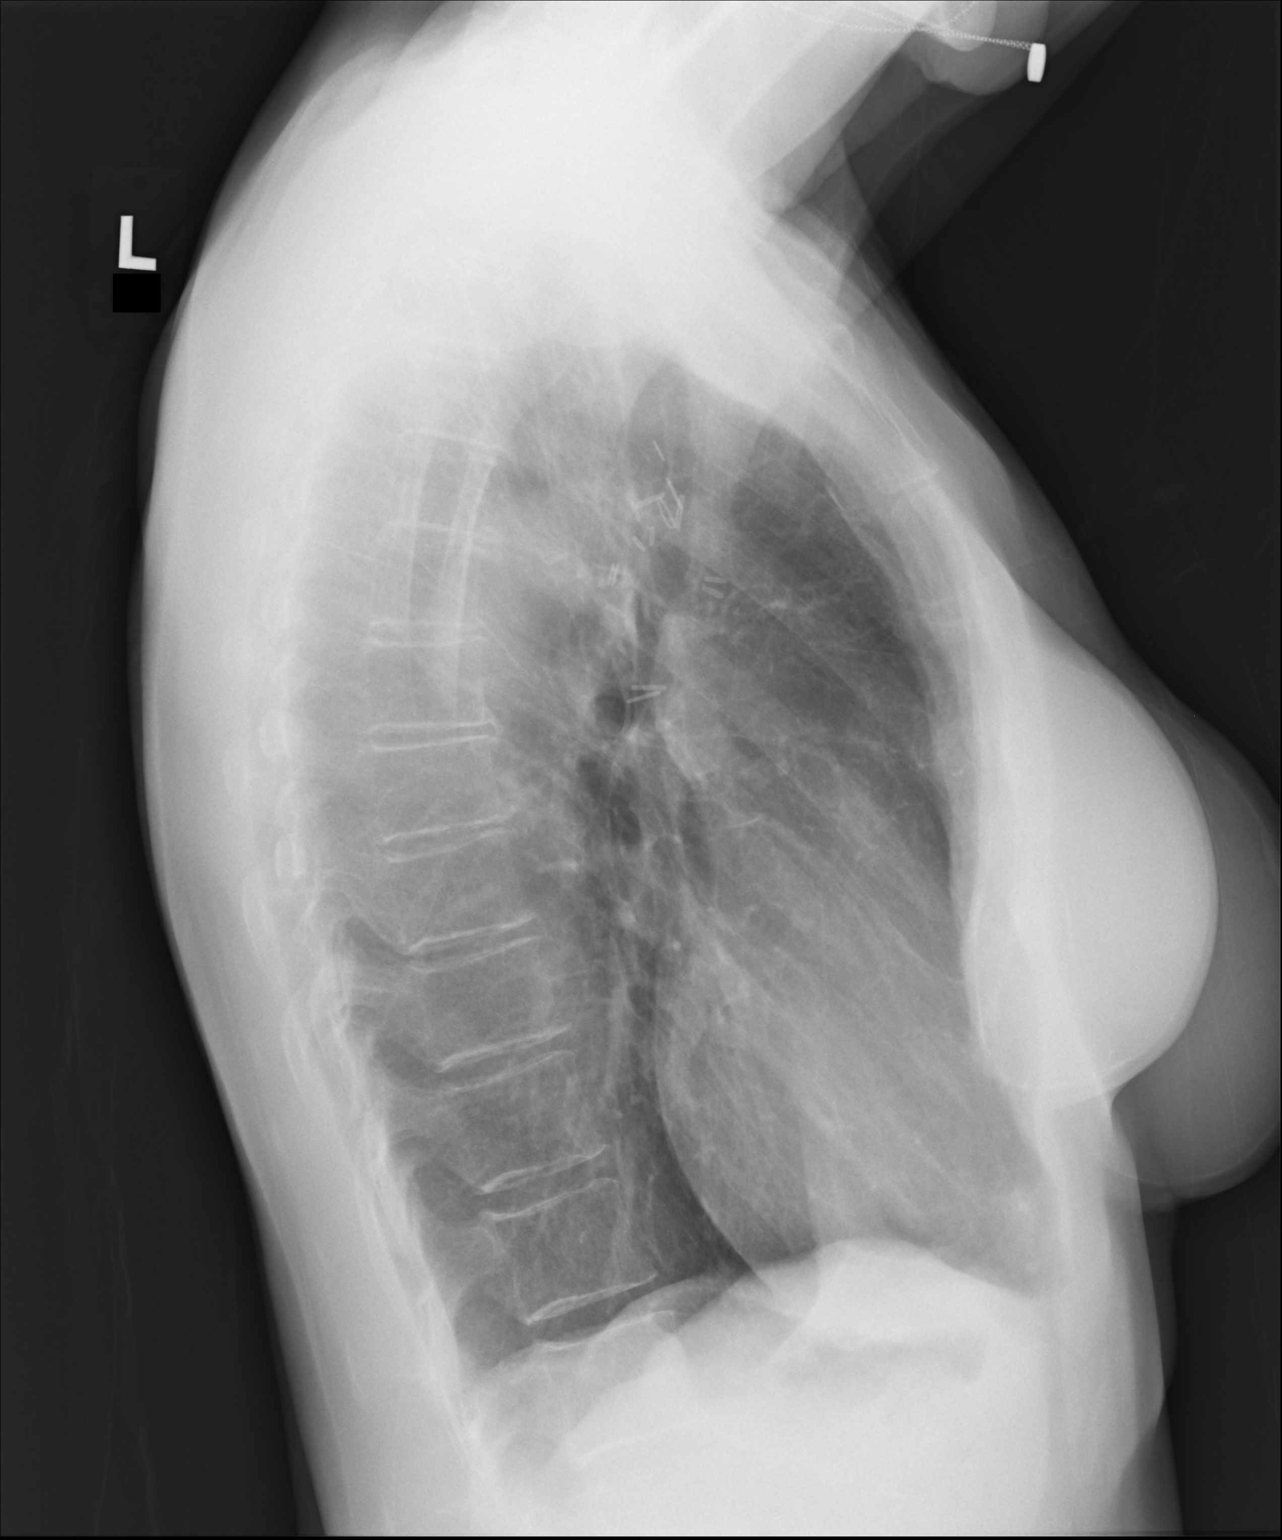

[2 of 2 positions shown; findings below may reference images not displayed]

FINDINGS: Azygous fissure. Biapical pleural/parenchymal scarring. No 
consolidation. No effusion. No pneumothorax. Normal cardiac size and pulmonary 
vascularity. Surgical clips left axilla. Bilateral breast implants.
IMPRESSION: No acute cardiopulmonary findings.

## 2016-08-30 NOTE — Telephone Encounter (Signed)
She is feeling much better, she is doing fine now.  Thanks

## 2016-08-30 NOTE — Telephone Encounter (Signed)
Have tried to reach patient on her listed phone numbers but both go to [facility].

## 2016-09-06 ENCOUNTER — Ambulatory Visit: Payer: Medicare HMO

## 2016-09-06 DIAGNOSIS — I480 Paroxysmal atrial fibrillation: Secondary | ICD-10-CM | POA: Insufficient documentation

## 2016-09-06 LAB — PR PROTHROMBIN TIME, ONSITE: prothrombin INR: 2.4

## 2016-09-06 NOTE — Progress Notes (Signed)
Anticoag Treatment Plan  As of 09/06/2016    INR goal:   2.0-3.0   TTR:   86.9 % (3 y)   Today's INR:   2.4   Full warfarin instructions:   5 mg every day   Weekly warfarin total:   35 mg   Next INR check:   10/07/2016   Target end date:            Anticoagulation Episode Summary     INR check location:       Send INR reminders to:   [facility] EDM CLINICAL SUPPORT STAFF POOL    Comments:               SUBJECTIVE    Patient Findings     Negatives:   Signs/symptoms of thrombosis, Signs/symptoms of bleeding, Laboratory test error suspected, Change in health, Change in alcohol use, Change in activity, Upcoming invasive procedure, Emergency department visit, Upcoming dental procedure, Missed doses, Extra doses, Change in medications, Change in diet/appetite, Hospital admission, Bruising, Other complaints          OBJECTIVE    Anticoag Visit Date 09/06/2016 08/09/2016 07/07/2016 06/24/2016 06/10/2016 05/13/2016 04/15/2016   INR Goal 2.0-3.0 2.0-3.0 2.0-3.0 2.0-3.0 2.0-3.0 2.0-3.0 2.0-3.0   Selected INR 2.4 2.4 2.4 2.6 3.0 2.3 2.8   INR Date 09/06/2016 08/09/2016 07/07/2016 06/24/2016 06/10/2016 05/13/2016 04/15/2016   Previous Week Total 35 mg 35 mg 35 mg 35 mg 37.5 mg 37.5 mg 37.5 mg   Pt Deviation No No No No No No No   Pt Findings Comments - - - - Less greens intake - -   Next Week Total 35 mg 35 mg 35 mg 35 mg 35 mg 37.5 mg 37.5 mg   Full Instructions 5 mg every day 5 mg every day 5 mg every day 5 mg every day 5 mg every day 7.5 mg on Tue; 5 mg all other days 7.5 mg on Tue; 5 mg all other days   Date of Next INR 10/07/2016 09/09/2016 08/06/2016 07/15/2016 06/24/2016 06/12/2016 05/13/2016       ASSESSMENT    Therapeutic INR in stable patient and without complications.     PLAN    1.  Continue Warfarin 5 mg 7 days of the week and re check INR in 4 weeks.  .  As of 09/06/2016    Warfarin maintenance plan:   5 mg every day   Full warfarin instructions:   5 mg every day   Weekly warfarin total:   35 mg   No change documented:   Ardelle Park, RN      Next INR check:   10/07/2016         Anticoagulation Episode Summary     Comments:             2. Nidhi Jacome acknowledged understanding of this plan      PATIENT INSTRUCTIONS    August 2018 Details    Sun Mon Tue Wed Thu Fri Sat        1               2               3               4                  5  6               7               8               9               10               11                 12               13               14               15               16               17               18                 19               20      5  mg   See details      21      5 mg         22      5 mg         23      5 mg         24      5 mg         25      5 mg           26      5 mg         27      5 mg         28      5 mg         29      5 mg         30      5 mg         31      5 mg           Date Details   08/20 This INR check                 September 2018 Details    Sun Mon Tue Wed Thu Fri Sat           1      5 mg           2      5 mg         3      5 mg         4      5 mg         5      5 mg         6      5 mg         7      5 mg         8      5 mg           9      5 mg  10      5 mg         11      5 mg         12      5 mg         13      5 mg         14      5 mg         15      5 mg           16      5 mg         17      5 mg         18      5 mg         19      5 mg         20      5 mg         21               22                 23               24               25               26               27               28               29                 30                       Date Details   No additional details    Date of next INR:  10/07/2016

## 2016-09-08 ENCOUNTER — Ambulatory Visit (INDEPENDENT_AMBULATORY_CARE_PROVIDER_SITE_OTHER): Payer: Medicare HMO

## 2016-09-08 ENCOUNTER — Encounter (INDEPENDENT_AMBULATORY_CARE_PROVIDER_SITE_OTHER): Payer: Self-pay

## 2016-09-08 VITALS — BP 128/70 | HR 64 | Ht 65.25 in | Wt 316.5 lb

## 2016-09-08 DIAGNOSIS — I480 Paroxysmal atrial fibrillation: Secondary | ICD-10-CM

## 2016-09-08 DIAGNOSIS — Z6841 Body Mass Index (BMI) 40.0 and over, adult: Secondary | ICD-10-CM

## 2016-09-08 NOTE — Progress Notes (Signed)
ELECTROPHYSIOLOGY CONSULTATION     RE: PAXTON KANAAN  DOB: [birth year]    Referrin physician: Dr. Ernestina Penna    REASON FOR CONSULTATION: Atrial fibrillation    HISTORY OF PRESENT ILLNESS: This 66 year old woman with lon history of palpitations and a several year history of intermittent atrial fibrillation.  She describes self-limitin episodes initially which have radually proressed to events lastin up to 5 days.  Durin atrial fibrillation, she feels anxious, unpleasant palpitations and fatiue.  She has been treated with sotalol at a dose of 80 m twice daily and warfarin.  She has required 2 cardioversions in the last year (Auust 2017, June 2018) on this reimen.  She complains of some difficulty concentratin which she attributes to sotalol.  ECG done in the last several months so is sinus rhythm at 60 with a QTC of 450 ms.  Evaluation for structural heart disease included stress echocardioraphy done 4 years ao which showed no sinificant structural heart disease or evidence of ischemia.  She's not had atrial fibrillation since her June cardioversion.    Patient Active Problem List    Dianosis Date Noted    Chronic anticoaulation 08/02/2015    Riht cervical radiculopathy 05/30/2015    Obesity 05/30/2015    Paroxysmal atrial fibrillation ([place]) 04/17/2014    GERD (astroesophaeal reflux disease) 11/08/2012    Palpitations 08/21/2012    OA (osteoarthritis) 07/28/2012    Asthma 07/24/2012    Hypercholesterolemia 07/24/2012    Hx of colonic polyps--Dr Read--03/21/10  cleared for 5 years 07/24/2012     Colonoscopy 01/2005      Edema 07/24/2012    Sleep apnea 07/24/2012    HTN (hypertension) 07/24/2012    Menopausal state 07/24/2012    Rash 07/24/2012    Pulmonary nodule 07/24/2012    Grief reaction 07/24/2012     05/11/2011      Strain of thoracic spine 07/24/2012             Current Outpatient Prescriptions   Medication Si Dispense Refill    Albuterol Sulfate HFA (VENTOLIN HFA) 108  (90 Base) MCG/ACT Inhalation Aero Soln Inhale 1-2 puffs by mouth every 6 hours as needed for shortness of breath/wheezin. 18  3    Ascorbic Acid (VITAMIN C OR) 500 m      ASPIRIN OR 81 m      Flaxseed, Linseed, (FLAXSEED OIL) 1200 MG Oral Cap None Entered      Fluticasone Propionate 50 MCG/ACT Nasal Suspension Spray 2 sprays into each nostril daily. 1 Inhaler 3    Fluticasone Propionate HFA (FLOVENT HFA) 110 MCG/ACT Inhalation Aerosol Inhale 2 puffs by mouth 2 times a day. - RINSE MOUTH AFTER USE 1 Inhaler 5    GLUCOSAMINE CHONDROITIN COMPLX OR       Manesium 400 MG Oral Cap Take 400 m by mouth.      Montelukast Sodium 10 MG Oral Tab Take 1 tablet (10 m) by mouth daily. 34 tablet 11    Multiple Vitamins-Minerals (MULTIVITAMIN OR) no iron      Probiotic Product (PROBIOTIC DAILY OR)       RaNITidine HCl 150 MG Oral Tab Take 1 tablet (150 m) by mouth 2 times a day. 180 tablet 3    Sotalol HCl 80 MG Oral Tab Take 1 tablet (80 m) by mouth 2 times a day. 180 tablet 3    Triamterene-HCTZ 37.5-25 MG Oral Tab TAKE ONE TABLET BY MOUTH ONCE DAILY 90 tablet 1    Warfarin Sodium (  COUMADIN) 5 MG Oral Tab Take 7.5mg  X 1 day a week (Tu) and 5mg  X 6 days a week or as instructed by your physician. 110 tablet 2     No current facility-administered medications for this visit.          MEDICATION ALLERGIES AND INTOLERANCES:  Bee venom; Cats [animals]; Ciprofloxacin; Dust mite extract; Pneumococcal vaccines; Pollen extract; and Sulfa antibiotics    PAST MEDICAL HISTORY:  otherwise  Relevant for treated sleep apnea    SOCIAL HISTORY: She does not smoke    FAMILY HISTORY: There is no family history of atrial fibrillation    REVIEW OF SYSTEMS: Comprehensive review of systems is obtained and is otherwise negative.      PHYSICAL EXAMINATION   Constitutional: Morbidly obese middle-aged woman  Vital signs: BP 128/70    Pulse 64    Ht 5' 5.25" (1.657 m)    Wt (!) 316 lb 8 oz (143.6 kg)    BMI 52.27 kg/m   Neck: JV are not  visible. Carotid upstrokes are brisk and without bruits  Chest:  Respirations are not labored. Lungs are clear to auscultation  Cardiac:  Regular rhythm.  There is no murmur, no gallop.  Abdomen: no organomegaly or tenderness   Extremities:  Symmetric upper and lower extremity pulses.  There is no leg edema.  Skin: warm and dry.  Psychiatric: normal affect. Oriented to time and place.    ECG: Sinus rhythm with normal intervals    ASSESSMENT:  Paroxysmal atrial fibrillation refractory low-dose sotalol.  On the basis of her clinical profile, she meets criteria for ablation having had breakthrough arrhythmias on formal antiarrhythmic drug therapy.  In addition, it is possible that she has CNS side effects related to sotalol.  Her obesity is an issue related to ablation increasing the risk of recurrence and increasing the risk of vascular issues with her procedure.  It is not a contraindication to ablation.  Alternatives to ablation include alternate antiarrhythmic drug therapy such as a class I agent or Multaq.    A detailed discussion over the rationale for pulmonary vein isolation, technique, risks and expected outcome was had with the patient today.  A frank discussion was had over the increased risk of recurrence related to her obesity and the modest increase in risk of the procedure because of her obesity.  She would like to think things over.     RECOMMENDATIONS:   1.  Consider alternate therapy for recurrent atrial fibrillation-catheter ablation versus Class 1 antiarrhythmic drug therapy.      Wyonia Hough, MD   Hagedorn, Ashok Norris, MD

## 2016-10-05 ENCOUNTER — Other Ambulatory Visit (INDEPENDENT_AMBULATORY_CARE_PROVIDER_SITE_OTHER): Payer: Self-pay

## 2016-10-05 DIAGNOSIS — I480 Paroxysmal atrial fibrillation: Secondary | ICD-10-CM

## 2016-10-05 NOTE — Progress Notes (Signed)
Orders only CTA chest angio pre-op for afib ablation.

## 2016-10-06 ENCOUNTER — Encounter (INDEPENDENT_AMBULATORY_CARE_PROVIDER_SITE_OTHER): Payer: Self-pay

## 2016-10-06 NOTE — Progress Notes (Signed)
Routed to [place] staff.

## 2016-10-06 NOTE — Progress Notes (Addendum)
Instructions for Electrophysiology Study/Ablation        PROCEDURE: Atrial fibrillation ablation    DATE:  October 19, 2016        CHECK IN TIME:  10:30 am  Check in at Cardiac Procedure Unit (across from [place] in [place]), your procedure will begin approximately 1.5 to 2 hours after this time.    Diet: Nothing to eat or drink after midnight     Medications: You are to hold your aspirin 1 week prior to your procedure.  Hold your triamterene-HCTZ and vitamins the day of procedure.  All other medications are fine to take morning of procedure with small sips of water.      Studies needed prior to procedure:  CTA chest angio    Blood work needed:  - CBC and BMP same day as protime is fine.  - PT/INR on October 14, 2016 with Dr. Cathie Hoops office.  Dr. Rene Paci would like INR between 2.0-2.5.  Instructions for Coumadin (Warfarin) will be discussed at that time.     Shower instructions: Please shower using your regular soap either the night before or the morning of your procedure paying special attention to your groin area.      POST-PROCEDURE INSTRUCTIONS:   Driving: Please arrange for transportation both to and from procedure. No driving for 24 hours or while you are taking narcotic pain medications   Activity instructions: No strenuous activity for 5-7days.  Follow up appointments:   - Please schedule a follow up appointment with Dr. Rene Paci 6-7 weeks after your procedure.  - Please note that it is common to have some palpitations after having an ablation, if these are prolonged or if you feel that they are causing you distress, please call.  For urgent issues please call 911 or seek immediate medical attention.    Questions? Please feel free to call [facility] at [phone].   Please note: [facility] will contact you several days prior to the procedure to discuss medications you are taking. Please follow the above instructions for medications to be taken/held on procedure day, if any questions  please call [facility].

## 2016-10-07 ENCOUNTER — Ambulatory Visit: Payer: Medicare HMO | Admitting: Unknown Physician Specialty

## 2016-10-07 ENCOUNTER — Other Ambulatory Visit (INDEPENDENT_AMBULATORY_CARE_PROVIDER_SITE_OTHER): Payer: Self-pay

## 2016-10-07 DIAGNOSIS — I480 Paroxysmal atrial fibrillation: Secondary | ICD-10-CM | POA: Insufficient documentation

## 2016-10-07 LAB — PR PROTHROMBIN TIME, ONSITE: prothrombin INR: 2.8

## 2016-10-07 NOTE — Progress Notes (Signed)
Anticoag Treatment Plan  As of 10/07/2016    INR goal:   2.0-3.0   TTR:   87.2 % (3.1 y)   Today's INR:   2.8   Full warfarin instructions:   5 mg every day   Weekly warfarin total:   35 mg   Next INR check:   10/14/2016   Target end date:            Anticoagulation Episode Summary     INR check location:       Send INR reminders to:   [facility] EDM CLINICAL SUPPORT STAFF POOL    Comments:               SUBJECTIVE    Patient Findings     Positives:   Upcoming invasive procedure    Negatives:   Signs/symptoms of thrombosis, Signs/symptoms of bleeding, Laboratory test error suspected, Change in health, Change in alcohol use, Change in activity, Emergency department visit, Upcoming dental procedure, Missed doses, Extra doses, Change in medications, Change in diet/appetite, Hospital admission, Bruising, Other complaints    Comments:   A. Fib ablation on 10/19/16, Dr. Rene Paci would like INR between 2.0-2.5          OBJECTIVE    [place] Visit Date 10/07/2016 09/06/2016 08/09/2016 07/07/2016 06/24/2016 06/10/2016 05/13/2016   INR Goal 2.0-3.0 2.0-3.0 2.0-3.0 2.0-3.0 2.0-3.0 2.0-3.0 2.0-3.0   Selected INR 2.8 2.4 2.4 2.4 2.6 3.0 2.3   INR Date 10/07/2016 09/06/2016 08/09/2016 07/07/2016 06/24/2016 06/10/2016 05/13/2016   Previous Week Total 35 mg 35 mg 35 mg 35 mg 35 mg 37.5 mg 37.5 mg   Pt Deviation No No No No No No No   Pt Findings Comments A. Fib ablation on 10/19/16, Dr. Rene Paci would like INR between 2.0-2.5 - - - - Less greens intake -   Next Week Total 35 mg 35 mg 35 mg 35 mg 35 mg 35 mg 37.5 mg   Full Instructions 5 mg every day 5 mg every day 5 mg every day 5 mg every day 5 mg every day 5 mg every day 7.5 mg on Tue; 5 mg all other days   Date of Next INR 10/14/2016 10/07/2016 09/09/2016 08/06/2016 07/15/2016 06/24/2016 06/12/2016       ASSESSMENT    Therapeutic INR in stable patient and without complications.   Atrial fibrillation ablation on 10/19/16, Dr. Rene Paci would like INR between 2.0-2.5.     PLAN    1.  Continue Warfarin 5 mg daily and re  check INR in one week.  Reviewed per MD protocol.         .  As of 10/07/2016    Warfarin maintenance plan:   5 mg every day   Full warfarin instructions:   5 mg every day   Weekly warfarin total:   35 mg   No change documented:   [name], RN   Next INR check:   10/14/2016         Anticoagulation Episode Summary     Comments:             2. Mykael Trott acknowledged understanding of this plan      PATIENT INSTRUCTIONS    September 2018 Details    Sun Mon Tue Wed Thu Fri Sat           1                 2  3               4               5               6               7               8                 9               10               11               12               13               14               15                 16               17               18               19               20      5  mg   See details      21      5 mg         22      5 mg           23      5 mg         24      5 mg         25      5 mg         26      5 mg         27      5 mg         28               29                 30                       Date Details   09/20 This INR check       Date of next INR:  10/14/2016

## 2016-10-11 ENCOUNTER — Ambulatory Visit: Payer: Medicare HMO

## 2016-10-11 DIAGNOSIS — I480 Paroxysmal atrial fibrillation: Secondary | ICD-10-CM | POA: Insufficient documentation

## 2016-10-11 LAB — CREATININE, POC: Creatinine, POC: 1 mg/dL (ref 0.51–1.18)

## 2016-10-11 LAB — BUN, POC: BUN, POC: 20 mg/dL (ref 8–21)

## 2016-10-12 ENCOUNTER — Telehealth (INDEPENDENT_AMBULATORY_CARE_PROVIDER_SITE_OTHER): Payer: Self-pay

## 2016-10-12 NOTE — Telephone Encounter (Signed)
Reviewed with patient her concerns regarding the admonition that she may have some palpitations following the procedure; advised her some arrhythmia is not unusual to have for a while after ablation.

## 2016-10-12 NOTE — Telephone Encounter (Signed)
Patient has questions about the instructions she received for her upcoming ablation

## 2016-10-14 ENCOUNTER — Ambulatory Visit (HOSPITAL_BASED_OUTPATIENT_CLINIC_OR_DEPARTMENT_OTHER): Payer: Medicare HMO | Admitting: Unknown Physician Specialty

## 2016-10-14 ENCOUNTER — Ambulatory Visit: Payer: Medicare HMO | Admitting: Unknown Physician Specialty

## 2016-10-14 DIAGNOSIS — I480 Paroxysmal atrial fibrillation: Secondary | ICD-10-CM | POA: Insufficient documentation

## 2016-10-14 DIAGNOSIS — E7800 Pure hypercholesterolemia, unspecified: Secondary | ICD-10-CM | POA: Insufficient documentation

## 2016-10-14 LAB — CBC (HEMOGRAM)
Hematocrit: 44 % (ref 36–45)
Hemoglobin: 14.8 g/dL (ref 11.5–15.5)
MCH: 28.5 pg (ref 27.3–33.6)
MCHC: 33.6 g/dL (ref 32.2–36.5)
MCV: 85 fL (ref 81–98)
Platelet Count: 299 10*3/uL (ref 150–400)
RBC: 5.2 10*6/uL — ABNORMAL HIGH (ref 3.80–5.00)
RDW-CV: 14.3 % (ref 11.6–14.4)
WBC: 10.73 10*3/uL — ABNORMAL HIGH (ref 4.3–10.0)

## 2016-10-14 LAB — BASIC METABOLIC PANEL
Anion Gap: 11 (ref 4–12)
Calcium: 8.9 mg/dL (ref 8.9–10.2)
Carbon Dioxide, Total: 24 meq/L (ref 22–32)
Chloride: 103 meq/L (ref 98–108)
Creatinine: 0.86 mg/dL (ref 0.38–1.02)
GFR, Calc, African American: >60 mL/min/{1.73_m2} (ref >59)
GFR, Calc, European American: >60 mL/min/{1.73_m2} (ref >59)
Glucose: 131 mg/dL — ABNORMAL HIGH (ref 62–125)
Potassium: 3.6 meq/L (ref 3.6–5.2)
Sodium: 138 meq/L (ref 135–145)
Urea Nitrogen: 16 mg/dL (ref 8–21)

## 2016-10-14 LAB — PR PROTHROMBIN TIME, ONSITE: prothrombin INR: 2

## 2016-10-14 LAB — LIPID PANEL
Cholesterol (LDL): 102 mg/dL (ref <130)
Cholesterol/HDL Ratio: 3.8
HDL Cholesterol: 52 mg/dL (ref >39)
Non-HDL Cholesterol: 146 mg/dL (ref 0–159)
Total Cholesterol: 198 mg/dL (ref <200)
Triglyceride: 218 mg/dL — ABNORMAL HIGH (ref <150)

## 2016-10-14 NOTE — Progress Notes (Signed)
Routed to [employer] and [employer] MA  notified

## 2016-10-14 NOTE — Progress Notes (Signed)
Anticoag Treatment Plan  As of 10/14/2016    INR goal:   2.0-3.0   TTR:   87.3 % (3.1 y)   Today's INR:   2.0   Full warfarin instructions:   5 mg every day   Weekly warfarin total:   35 mg   Next INR check:   10/26/2016   Target end date:            Anticoagulation Episode Summary     INR check location:       Send INR reminders to:   [facility] EDM CLINICAL SUPPORT STAFF POOL    Comments:               SUBJECTIVE    Patient Findings     Positives:   Upcoming invasive procedure    Negatives:   Signs/symptoms of thrombosis, Signs/symptoms of bleeding, Laboratory test error suspected, Change in health, Change in alcohol use, Change in activity, Emergency department visit, Upcoming dental procedure, Missed doses, Extra doses, Change in medications, Change in diet/appetite, Hospital admission, Bruising, Other complaints    Comments:   A. Fib ablation on 10/2          OBJECTIVE    Anticoag Visit Date 10/14/2016 10/07/2016 09/06/2016 08/09/2016 07/07/2016 06/24/2016 06/10/2016   INR Goal 2.0-3.0 2.0-3.0 2.0-3.0 2.0-3.0 2.0-3.0 2.0-3.0 2.0-3.0   Selected INR 2.0 2.8 2.4 2.4 2.4 2.6 3.0   INR Date 10/14/2016 10/07/2016 09/06/2016 08/09/2016 07/07/2016 06/24/2016 06/10/2016   Previous Week Total 35 mg 35 mg 35 mg 35 mg 35 mg 35 mg 37.5 mg   Pt Deviation No No No No No No No   Pt Findings Comments A. Fib ablation on 10/2 A. Fib ablation on 10/19/16, Dr. Rene Paci would like INR between 2.0-2.5 - - - - Less greens intake   Next Week Total 35 mg 35 mg 35 mg 35 mg 35 mg 35 mg 35 mg   Full Instructions 5 mg every day 5 mg every day 5 mg every day 5 mg every day 5 mg every day 5 mg every day 5 mg every day   Date of Next INR 10/26/2016 10/14/2016 10/07/2016 09/09/2016 08/06/2016 07/15/2016 06/24/2016       ASSESSMENT    Therapeutic INR in stable patient and without complications.  Atrial fibrillation ablation on 10/19/16, Dr. Rene Paci would like INR closer to 2.0.      PLAN    1.  Continue Warfarin 5 mg daily and re check INR in one week s/p ablation.  Reviewed per MD  protocol.        .  As of 10/14/2016    Warfarin maintenance plan:   5 mg every day   Full warfarin instructions:   5 mg every day   Weekly warfarin total:   35 mg   Next INR check:   10/26/2016         Anticoagulation Episode Summary     Comments:             2. Caitlin Wiley acknowledged understanding of this plan      PATIENT INSTRUCTIONS    September 2018 Details    Sun Mon Tue Wed Thu Fri Sat           1                 2               3  4               5               6               7               8                 9               10               11               12               13               14               15                 16               17               18               19               20               21               22                 23               24               25               26               27      5  mg   See details      28      5 mg         29      5 mg           30      5 mg                Date Details   09/27 This INR check                 October 2018 Details    Sun Mon Tue Wed Thu Fri Sat      1      5 mg         2      5 mg         3      5 mg         4      5 mg         5      5 mg         6      5 mg           7      5 mg         8      5 mg         9  5 mg         10               11               12               13                 14               15               16               17               18               19               20                 21               22               23               24               25               26               27                 28               29               30               31                    Date Details   No additional details    Date of next INR:  10/26/2016

## 2016-10-16 ENCOUNTER — Telehealth (HOSPITAL_BASED_OUTPATIENT_CLINIC_OR_DEPARTMENT_OTHER): Payer: Self-pay

## 2016-10-16 NOTE — Telephone Encounter (Signed)
(  TEXTING IS AN OPTION FOR [facility] ONLY)  Is this a [place] clinic? No      RETURN CALL: Detailed message on voicemail only      SUBJECT:  Cancellation/Reschedule Request     ORIGINAL APPOINTMENT DATE: 11/15/16, TIME: 07:22VJ  REASON: Conflict with patient's schedule  REQUESTED DATE: 11/16/16, 11/19/16, 11/20/16, 11/22/16 avoid Mondays and Wednesdays, TIME: 10am, preferably  UNABLE TO APPOINT BECAUSE: Per SOP

## 2016-10-18 ENCOUNTER — Encounter (INDEPENDENT_AMBULATORY_CARE_PROVIDER_SITE_OTHER): Payer: Self-pay

## 2016-10-18 NOTE — Telephone Encounter (Signed)
Left vm for pt to call back and reschedule appt

## 2016-10-18 NOTE — Telephone Encounter (Signed)
Pt has been rescheduled to 11/18/2016.     Fransico Michael, PSS Lead  [facility]

## 2016-10-19 ENCOUNTER — Ambulatory Visit
Admission: RE | Admit: 2016-10-19 | Discharge: 2016-10-20 | Disposition: A | Discharge status: Home / Self Care | Payer: Medicare HMO

## 2016-10-19 ENCOUNTER — Encounter (INDEPENDENT_AMBULATORY_CARE_PROVIDER_SITE_OTHER): Payer: Self-pay

## 2016-10-19 DIAGNOSIS — Z881 Allergy status to other antibiotic agents status: Secondary | ICD-10-CM | POA: Insufficient documentation

## 2016-10-19 DIAGNOSIS — F329 Major depressive disorder, single episode, unspecified: Secondary | ICD-10-CM | POA: Insufficient documentation

## 2016-10-19 DIAGNOSIS — J45909 Unspecified asthma, uncomplicated: Secondary | ICD-10-CM | POA: Insufficient documentation

## 2016-10-19 DIAGNOSIS — K210 Gastro-esophageal reflux disease with esophagitis: Secondary | ICD-10-CM | POA: Insufficient documentation

## 2016-10-19 DIAGNOSIS — E785 Hyperlipidemia, unspecified: Secondary | ICD-10-CM | POA: Insufficient documentation

## 2016-10-19 DIAGNOSIS — I480 Paroxysmal atrial fibrillation: Secondary | ICD-10-CM | POA: Insufficient documentation

## 2016-10-19 DIAGNOSIS — M1990 Unspecified osteoarthritis, unspecified site: Secondary | ICD-10-CM | POA: Insufficient documentation

## 2016-10-19 DIAGNOSIS — Z7901 Long term (current) use of anticoagulants: Secondary | ICD-10-CM | POA: Insufficient documentation

## 2016-10-19 DIAGNOSIS — Z882 Allergy status to sulfonamides status: Secondary | ICD-10-CM | POA: Insufficient documentation

## 2016-10-19 DIAGNOSIS — K5792 Diverticulitis of intestine, part unspecified, without perforation or abscess without bleeding: Secondary | ICD-10-CM | POA: Insufficient documentation

## 2016-10-19 DIAGNOSIS — M549 Dorsalgia, unspecified: Secondary | ICD-10-CM | POA: Insufficient documentation

## 2016-10-19 DIAGNOSIS — M542 Cervicalgia: Secondary | ICD-10-CM | POA: Insufficient documentation

## 2016-10-19 DIAGNOSIS — Z87891 Personal history of nicotine dependence: Secondary | ICD-10-CM | POA: Insufficient documentation

## 2016-10-19 DIAGNOSIS — I10 Essential (primary) hypertension: Secondary | ICD-10-CM | POA: Insufficient documentation

## 2016-10-19 LAB — PROTHROMBIN TIME (NWH)
Prothrombin INR: 2.3 — ABNORMAL HIGH (ref 0.8–1.3)
Prothrombin Time Patient, NWH: 21.5 s — ABNORMAL HIGH (ref 8.9–12.1)

## 2016-10-26 ENCOUNTER — Ambulatory Visit: Payer: Medicare HMO | Admitting: Unknown Physician Specialty

## 2016-10-26 DIAGNOSIS — I480 Paroxysmal atrial fibrillation: Secondary | ICD-10-CM | POA: Insufficient documentation

## 2016-10-26 LAB — PR PROTHROMBIN TIME, ONSITE: prothrombin INR: 2.5

## 2016-10-26 LAB — ACT(CELITE), POC
ACT(Celite), POC: 352 s — ABNORMAL HIGH (ref 84–139)
ACT(Celite), POC: 356 s — ABNORMAL HIGH (ref 84–139)
ACT(Celite), POC: 176 s — ABNORMAL HIGH (ref 84–139)
ACT(Celite), POC: 356 s — ABNORMAL HIGH (ref 84–139)

## 2016-10-26 NOTE — Progress Notes (Signed)
Anticoag Treatment Plan  As of 10/26/2016    INR goal:   2.0-3.0   TTR:   87.4 % (3.2 y)   Today's INR:   2.5   Full warfarin instructions:   5 mg every day   Weekly warfarin total:   35 mg   Next INR check:   11/02/2016   Target end date:            Anticoagulation Episode Summary     INR check location:       Send INR reminders to:   [facility] EDM CLINICAL SUPPORT STAFF POOL    Comments:               SUBJECTIVE    Patient Findings     Positives:   Other complaints    Negatives:   Signs/symptoms of thrombosis, Signs/symptoms of bleeding, Laboratory test error suspected, Change in health, Change in alcohol use, Change in activity, Upcoming invasive procedure, Emergency department visit, Upcoming dental procedure, Missed doses, Extra doses, Change in medications, Change in diet/appetite, Hospital admission, Bruising    Comments:   S/p ablation 10/19/16          OBJECTIVE    Anticoag Visit Date 10/26/2016 10/14/2016 10/07/2016 09/06/2016 08/09/2016 07/07/2016 06/24/2016   INR Goal 2.0-3.0 2.0-3.0 2.0-3.0 2.0-3.0 2.0-3.0 2.0-3.0 2.0-3.0   Selected INR 2.5 2.0 2.8 2.4 2.4 2.4 2.6   INR Date 10/26/2016 10/14/2016 10/07/2016 09/06/2016 08/09/2016 07/07/2016 06/24/2016   Previous Week Total 35 mg 35 mg 35 mg 35 mg 35 mg 35 mg 35 mg   Pt Deviation No No No No No No No   Pt Findings Comments S/p ablation 10/19/16 A. Fib ablation on 10/2 A. Fib ablation on 10/19/16, Dr. Rene Paci would like INR between 2.0-2.5 - - - -   Next Week Total 35 mg 35 mg 35 mg 35 mg 35 mg 35 mg 35 mg   Full Instructions 5 mg every day 5 mg every day 5 mg every day 5 mg every day 5 mg every day 5 mg every day 5 mg every day   Date of Next INR 11/02/2016 10/26/2016 10/14/2016 10/07/2016 09/09/2016 08/06/2016 07/15/2016       ASSESSMENT    Therapeutic INR in stable patient and without complications.  S/p atrial fibrillation ablation on 10/19/16.      PLAN    1.  Continue Warfarin 5 mg daily and re check INR in one week.  Weekly INR's for now.  Reviewed per MD protocol.         .  As of  10/26/2016    Warfarin maintenance plan:   5 mg every day   Full warfarin instructions:   5 mg every day   Weekly warfarin total:   35 mg   Next INR check:   11/02/2016         Anticoagulation Episode Summary     Comments:             2. Caitlin Wiley acknowledged understanding of this plan      PATIENT INSTRUCTIONS    October 2018 Details    Sun Mon Tue Wed Thu Fri Sat      1               2               3                4  5               6                 7               8               9      5  mg   See details      10      5 mg         11      5 mg         12      5 mg         13      5 mg           14      5 mg         15      5 mg         16      5 mg         17               18               19               20                 21               22               23               24               25               26               27                 28               29               30               31                    Date Details   10/09 This INR check       Date of next INR:  11/02/2016

## 2016-11-02 ENCOUNTER — Ambulatory Visit: Payer: Medicare HMO | Admitting: Unknown Physician Specialty

## 2016-11-02 DIAGNOSIS — I480 Paroxysmal atrial fibrillation: Secondary | ICD-10-CM | POA: Insufficient documentation

## 2016-11-02 LAB — PR PROTHROMBIN TIME, ONSITE: prothrombin INR: 3.3

## 2016-11-02 NOTE — Progress Notes (Signed)
Anticoag Treatment Plan  As of 11/02/2016    INR goal:   2.0-3.0   TTR:   87.3 % (3.2 y)   Today's INR:   3.3!   Full warfarin instructions:   5 mg every day   Weekly warfarin total:   35 mg   Next INR check:   11/09/2016   Target end date:            Anticoagulation Episode Summary     INR check location:       Send INR reminders to:   [facility] EDM CLINICAL SUPPORT STAFF POOL    Comments:               SUBJECTIVE    Patient Findings     Positives:   Change in diet/appetite    Negatives:   Signs/symptoms of thrombosis, Signs/symptoms of bleeding, Laboratory test error suspected, Change in health, Change in alcohol use, Change in activity, Upcoming invasive procedure, Emergency department visit, Upcoming dental procedure, Missed doses, Extra doses, Change in medications, Hospital admission, Bruising, Other complaints    Comments:   Had less greens last week           OBJECTIVE    Anticoag Visit Date 11/02/2016 10/26/2016 10/14/2016 10/07/2016 09/06/2016 08/09/2016 07/07/2016   INR Goal 2.0-3.0 2.0-3.0 2.0-3.0 2.0-3.0 2.0-3.0 2.0-3.0 2.0-3.0   Selected INR 3.3 2.5 2.0 2.8 2.4 2.4 2.4   INR Date 11/02/2016 10/26/2016 10/14/2016 10/07/2016 09/06/2016 08/09/2016 07/07/2016   Previous Week Total 35 mg 35 mg 35 mg 35 mg 35 mg 35 mg 35 mg   Pt Deviation No No No No No No No   Pt Findings Comments Had less greens last week  S/p ablation 10/19/16 A. Fib ablation on 10/2 A. Fib ablation on 10/19/16, Dr. Rene Paci would like INR between 2.0-2.5 - - -   Next Week Total 35 mg 35 mg 35 mg 35 mg 35 mg 35 mg 35 mg   Full Instructions 5 mg every day 5 mg every day 5 mg every day 5 mg every day 5 mg every day 5 mg every day 5 mg every day   Date of Next INR 11/09/2016 11/02/2016 10/26/2016 10/14/2016 10/07/2016 09/09/2016 08/06/2016       ASSESSMENT    Elevated INR in association with having less greens last week and without complications.  S/p atrial fibrillation ablation on 10/19/16.       PLAN    1.  Will increase greens x 2 days, continue Warfarin 5 mg  daily and re check INR in one week.  Weekly INR's for now.  Reviewed per MD protocol.  Routing ot Dr. Ivan Croft for review and signature.          .  As of 11/02/2016    Warfarin maintenance plan:   5 mg every day   Full warfarin instructions:   5 mg every day   Weekly warfarin total:   35 mg   No change documented:   [name], RN   Next INR check:   11/09/2016         Anticoagulation Episode Summary     Comments:             2. Pam Vanalstine acknowledged understanding of this plan      PATIENT INSTRUCTIONS    October 2018 Details    Sun Mon Tue Wed Thu Fri Sat      1  2               3               4               5               6                 7               8               9               10               11               12               13                 14               15               16      5  mg   See details      17      5 mg         18      5 mg         19      5 mg         20      5 mg           21      5 mg         22      5 mg         23      5 mg         24               25               26               27                 28               29               30               31                    Date Details   10/16 This INR check       Date of next INR:  11/09/2016

## 2016-11-03 NOTE — Progress Notes (Signed)
Reviewed INR data and agree with warfarin dosing.

## 2016-11-09 ENCOUNTER — Ambulatory Visit: Payer: Medicare HMO

## 2016-11-09 DIAGNOSIS — I480 Paroxysmal atrial fibrillation: Secondary | ICD-10-CM

## 2016-11-09 LAB — PR PROTHROMBIN TIME, ONSITE: prothrombin INR: 2.6

## 2016-11-09 NOTE — Progress Notes (Signed)
Anticoag Treatment Plan  As of 11/09/2016    INR goal:   2.0-3.0   TTR:   87.1 % (3.2 y)   Today's INR:   2.6   Full warfarin instructions:   5 mg every day   Weekly warfarin total:   35 mg   Next INR check:   11/18/2016   Target end date:            Anticoagulation Episode Summary     INR check location:       Send INR reminders to:   [facility] EDM CLINICAL SUPPORT STAFF POOL    Comments:               SUBJECTIVE        OBJECTIVE    [place] Visit Date 11/09/2016 11/02/2016 10/26/2016 10/14/2016 10/07/2016 09/06/2016 08/09/2016   INR Goal 2.0-3.0 2.0-3.0 2.0-3.0 2.0-3.0 2.0-3.0 2.0-3.0 2.0-3.0   Selected INR 2.6 3.3 2.5 2.0 2.8 2.4 2.4   INR Date 11/09/2016 11/02/2016 10/26/2016 10/14/2016 10/07/2016 09/06/2016 08/09/2016   Previous Week Total 35 mg 35 mg 35 mg 35 mg 35 mg 35 mg 35 mg   Pt Deviation No No No No No No No   Pt Findings Comments - Had less greens last week  S/p ablation 10/19/16 A. Fib ablation on 10/2 A. Fib ablation on 10/19/16, Dr. Rene Paci would like INR between 2.0-2.5 - -   Next Week Total 35 mg 35 mg 35 mg 35 mg 35 mg 35 mg 35 mg   Full Instructions 5 mg every day 5 mg every day 5 mg every day 5 mg every day 5 mg every day 5 mg every day 5 mg every day   Date of Next INR 11/18/2016 11/09/2016 11/02/2016 10/26/2016 10/14/2016 10/07/2016 09/09/2016       ASSESSMENT    Therapeutic INR in stable patient and without complications.     PLAN    1.  Continue Warfarin  .  As of 11/09/2016    Warfarin maintenance plan:   5 mg every day   Full warfarin instructions:   5 mg every day   Weekly warfarin total:   35 mg   No change documented:   Ardelle Park, RN   Next INR check:   11/18/2016         Anticoagulation Episode Summary     Comments:             2. Belle Charlie acknowledged understanding of this plan      PATIENT INSTRUCTIONS    October 2018 Details    Sun Mon Tue Wed Thu Fri Sat      1               2               3               4               5               6                 7               8               9                10  11               12               13                 14               15               16               17               18               19               20                 21               22               23      5  mg   See details      24      5 mg         25      5 mg         26      5 mg         27      5 mg           28      5 mg         29      5 mg         30      5 mg         31      5 mg             Date Details   10/23 This INR check                 November 2018 Details    Sun Mon Tue Wed Thu Fri Sat         1      5 mg         2               3                 4               5               6               7               8               9               10                 11               12               13               14                15  16               17                 18               19               20               21               22               23               24                 25               26               27               28               29               30                  Date Details   No additional details    Date of next INR:  11/18/2016

## 2016-11-15 ENCOUNTER — Encounter (HOSPITAL_BASED_OUTPATIENT_CLINIC_OR_DEPARTMENT_OTHER): Payer: Medicare HMO

## 2016-11-18 ENCOUNTER — Encounter (HOSPITAL_BASED_OUTPATIENT_CLINIC_OR_DEPARTMENT_OTHER): Payer: Self-pay

## 2016-11-18 ENCOUNTER — Ambulatory Visit (HOSPITAL_BASED_OUTPATIENT_CLINIC_OR_DEPARTMENT_OTHER): Payer: Medicare HMO

## 2016-11-18 ENCOUNTER — Ambulatory Visit: Payer: Medicare HMO

## 2016-11-18 ENCOUNTER — Other Ambulatory Visit: Payer: Self-pay

## 2016-11-18 VITALS — BP 122/70 | HR 80 | Ht 65.25 in | Wt 316.0 lb

## 2016-11-18 DIAGNOSIS — Z6841 Body Mass Index (BMI) 40.0 and over, adult: Secondary | ICD-10-CM

## 2016-11-18 DIAGNOSIS — I480 Paroxysmal atrial fibrillation: Secondary | ICD-10-CM | POA: Insufficient documentation

## 2016-11-18 DIAGNOSIS — Z01818 Encounter for other preprocedural examination: Secondary | ICD-10-CM | POA: Insufficient documentation

## 2016-11-18 LAB — PR PROTHROMBIN TIME, ONSITE: prothrombin INR: 2.2

## 2016-11-18 NOTE — Progress Notes (Signed)
Anticoag Treatment Plan  As of 11/18/2016    INR goal:   2.0-3.0   TTR:   87.2 % (3.2 y)   Today's INR:   2.2   Full warfarin instructions:   5 mg every day   Weekly warfarin total:   35 mg   Next INR check:   12/18/2016   Target end date:            Anticoagulation Episode Summary     INR check location:       Send INR reminders to:   [facility] EDM CLINICAL SUPPORT STAFF POOL    Comments:               SUBJECTIVE    Patient Findings     Negatives:   Signs/symptoms of thrombosis, Signs/symptoms of bleeding, Laboratory test error suspected, Change in health, Change in alcohol use, Change in activity, Upcoming invasive procedure, Emergency department visit, Upcoming dental procedure, Missed doses, Extra doses, Change in medications, Change in diet/appetite, Hospital admission, Bruising, Other complaints          OBJECTIVE    Anticoag Visit Date 11/18/2016 11/09/2016 11/02/2016 10/26/2016 10/14/2016 10/07/2016 09/06/2016   INR Goal 2.0-3.0 2.0-3.0 2.0-3.0 2.0-3.0 2.0-3.0 2.0-3.0 2.0-3.0   Selected INR 2.2 2.6 3.3 2.5 2.0 2.8 2.4   INR Date 11/18/2016 11/09/2016 11/02/2016 10/26/2016 10/14/2016 10/07/2016 09/06/2016   Previous Week Total 35 mg 35 mg 35 mg 35 mg 35 mg 35 mg 35 mg   Pt Deviation No No No No No No No   Pt Findings Comments - - Had less greens last week  S/p ablation 10/19/16 A. Fib ablation on 10/2 A. Fib ablation on 10/19/16, Dr. Rene Paci would like INR between 2.0-2.5 -   Next Week Total 35 mg 35 mg 35 mg 35 mg 35 mg 35 mg 35 mg   Full Instructions 5 mg every day 5 mg every day 5 mg every day 5 mg every day 5 mg every day 5 mg every day 5 mg every day   Date of Next INR 12/18/2016 11/18/2016 11/09/2016 11/02/2016 10/26/2016 10/14/2016 10/07/2016       ASSESSMENT    Therapeutic INR in stable patient and without complications.     PLAN    1.  Continue Warfarin 5 mg daily and re check INR in one week.  Weekly INR's for one month.    .  As of 11/18/2016    Warfarin maintenance plan:   5 mg every day   Full warfarin instructions:   5  mg every day   Weekly warfarin total:   35 mg   No change documented:   Ardelle Park, RN   Next INR check:   12/18/2016         Anticoagulation Episode Summary     Comments:             2. Angelika Jerrett acknowledged understanding of this plan      PATIENT INSTRUCTIONS    November 2018 Details    Sun Mon Tue Wed Thu Fri Sat         1      5 mg   See details      2      5 mg         3      5 mg           4      5 mg  5      5 mg         6      5 mg         7      5 mg         8      5 mg         9      5 mg         10      5 mg           11      5 mg         12      5 mg         13      5 mg         14      5 mg         15      5 mg         16      5 mg         17      5 mg           18      5 mg         19      5 mg         20      5 mg         21      5 mg         22      5 mg         23      5 mg         24      5 mg           25      5 mg         26      5 mg         27      5 mg         28      5 mg         29      5 mg         30      5 mg           Date Details   11/01 This INR check                 December 2018 Details    Sun Mon Tue Wed Thu Fri Sat           1      5 mg           2               3               4               5               6               7               8                  9  10               11               12               13               14               15                 16               17               18               19               20               21               22                 23               24               25               26               27               28               29                 30               31                      Date Details   No additional details    Date of next INR:  12/18/2016

## 2016-11-18 NOTE — Progress Notes (Signed)
Cardiovascular Clinical Evaluation Note    Primary Care Provider: Corey Harold, MD    Referring Provider: No ref. provider found    DOB:  1950-06-15    CHIEF COMPLAINT: Follow-up after atrial fibrillation ablation.    HISTORY OF PRESENT ILLNESS: She doesn't feel particularly different as she is currently in sinus rhythm.  She continues to work on [employer] but finds it difficult to lose weight.  She is still on warfarin therapy and wonders how long she will need to take this.  Dr. Tillie Rung note does not indicate duration of therapy.  She also wants to have dental work done and her dentist wants to do light sedation.  Graceyn has significant sleep apnea and I'm concerned about sedation in her situation.  Marko Stai action prefer not have any sedation and just have local lidocaine therapy.  Phone call was placed to her dentist but she has not yet responded.    MEDICATIONS:    Current Outpatient Prescriptions   Medication Sig Dispense Refill    Albuterol Sulfate HFA (VENTOLIN HFA) 108 (90 Base) MCG/ACT Inhalation Aero Soln Inhale 1-2 puffs by mouth every 6 hours as needed for shortness of breath/wheezing. 18 g 3    Ascorbic Acid (VITAMIN C OR) 500 mg      ASPIRIN OR 81 mg      Flaxseed, Linseed, (FLAXSEED OIL) 1200 MG Oral Cap None Entered      Fluticasone Propionate 50 MCG/ACT Nasal Suspension Spray 2 sprays into each nostril daily. 1 Inhaler 3    Fluticasone Propionate HFA (FLOVENT HFA) 110 MCG/ACT Inhalation Aerosol Inhale 2 puffs by mouth 2 times a day. - RINSE MOUTH AFTER USE 1 Inhaler 5    GLUCOSAMINE CHONDROITIN COMPLX OR       Magnesium 400 MG Oral Cap Take 400 mg by mouth.      Montelukast Sodium 10 MG Oral Tab Take 1 tablet (10 mg) by mouth daily. 34 tablet 11    Multiple Vitamins-Minerals (MULTIVITAMIN OR) no iron      Probiotic Product (PROBIOTIC DAILY OR)       RaNITidine HCl 150 MG Oral Tab Take 1 tablet (150 mg) by mouth 2 times a day. 180 tablet 3    Sotalol HCl 80 MG Oral  Tab Take 1 tablet (80 mg) by mouth 2 times a day. 180 tablet 3    Triamterene-HCTZ 37.5-25 MG Oral Tab TAKE ONE TABLET BY MOUTH ONCE DAILY 90 tablet 1    Warfarin Sodium (COUMADIN) 5 MG Oral Tab Take 7.5mg  X 1 day a week (Tu) and 5mg  X 6 days a week or as instructed by your physician. 110 tablet 2     No current facility-administered medications for this visit.        ALLERGIES:  Bee venom; Cats [animals]; Ciprofloxacin; Dust mite extract; Pneumococcal vaccines; Pollen extract; and Sulfa antibiotics      PROBLEM LIST:  Patient Active Problem List    Diagnosis Date Noted    Chronic anticoagulation [Z79.01] 08/02/2015    Right cervical radiculopathy [M54.12] 05/30/2015    Obesity [E66.9] 05/30/2015    Paroxysmal atrial fibrillation ([place]) [I48.0] 04/17/2014    GERD (gastroesophageal reflux disease) [K21.9] 11/08/2012    Palpitations [R00.2] 08/21/2012    OA (osteoarthritis) [M19.90] 07/28/2012    Asthma [J45.909] 07/24/2012    Hypercholesterolemia [E78.00] 07/24/2012    Hx of colonic polyps--Dr Read--03/21/10  cleared for 5 years [Z86.010] 07/24/2012     Colonoscopy 01/2005  Edema [R60.9] 07/24/2012    Sleep apnea [G47.30] 07/24/2012    HTN (hypertension) [I10] 07/24/2012    Menopausal state [N95.1] 07/24/2012    Rash [R21] 07/24/2012    Pulmonary nodule [R91.1] 07/24/2012    Grief reaction [F43.21] 07/24/2012     05/11/2011      Strain of thoracic spine [S29.019A] 07/24/2012       REVIEW OF SYSTEMS   CONSTITUTIONAL:  No fever, weight loss.  EYES:  No visual loss.  ENT:  No hearing loss, exudate.  CARDIOVASCULAR:  See HPI.  GI:  No melena, hematochezia, hematemesis.  SKIN:  No lesions.  GU:  No dysuria, hematuria.  MUSCULOSKELETAL:  No myalgias, no arthralgias.  NEURO:  No TIA or CVA symptoms.  ALLERGIC/IMMUNOLOGIC:  No hives or rash.  PSYCHIATRIC:  No suicidal ideations.    PHYSICAL EXAM    CONSTITUTIONAL: This is a well-nourished female in no distress.    BP 122/70    Pulse 80    Ht 5' 5.25"  (1.657 m)    Wt (!) 316 lb (143.3 kg)    SpO2 94%    BMI 52.18 kg/m   NEURO/PSYCH:  Oriented x 3 with normal affect.  EYES:  Conjunctivae pink, sclerae clear.  E/N/T:  Oral mucosa pink, dentition normal.  NECK:  Jugular venous pressure normal.  Thyroid is not enlarged.  RESPIRATORY:  Respiratory effort is normal.  Lungs are clear to percussion/auscultation.  CARDIOVASCULAR:  PMI at 5th ICS/MCL.  Regular rhythm.  S1, S2 normal.  No S3, S4, no murmur.  Carotid upstrokes are brisk, without bruits.  Abdominal aorta normal to palpation, no bruit.  Femoral pulses normal, no bruits.  Pedal pulses normal bilaterally.  Extremities no edema.  GASTROINTESTINAL:  Abdomen without masses or tenderness.  No hepatosplenomegaly.  EXTREMITIES:  No clubbing or cyanosis.  MUSCULOSKELETAL:  Normal gait and muscle strength.  SKIN:  Warm and dry, no lesions.    IMPRESSION: Patient remains in sinus rhythm.  Will  speak with Dr. Rene Paci to see him what his regulations regarding Coumadin duration are appropriate.      [name], MD [facility]  11/18/2016  4:37 PM    Electronically transcribed by voice recognition technology.  Although every effort was made to ensure accuracy, some transcription errors may occur.

## 2016-11-19 LAB — EKG 12 LEAD
Atrial Rate: 63 {beats}/min
Diagnosis: NORMAL
P Axis: 40 degrees
P-R Interval: 196 ms
Q-T Interval: 434 ms
QRS Duration: 94 ms
QTC Calculation: 444 ms
R Axis: 87 degrees
T Axis: 40 degrees
Ventricular Rate: 63 {beats}/min

## 2016-11-26 ENCOUNTER — Other Ambulatory Visit: Payer: Self-pay

## 2016-11-26 DIAGNOSIS — R600 Localized edema: Secondary | ICD-10-CM

## 2016-11-30 MED ORDER — TRIAMTERENE-HCTZ 37.5-25 MG OR TABS
ORAL_TABLET | ORAL | 2 refills | Status: DC
Start: 2016-11-30 — End: 2017-08-23

## 2016-12-01 ENCOUNTER — Ambulatory Visit (INDEPENDENT_AMBULATORY_CARE_PROVIDER_SITE_OTHER): Payer: Medicare HMO

## 2016-12-01 ENCOUNTER — Encounter (INDEPENDENT_AMBULATORY_CARE_PROVIDER_SITE_OTHER): Payer: Self-pay

## 2016-12-01 VITALS — BP 118/82 | HR 68 | Ht 66.5 in | Wt 314.9 lb

## 2016-12-01 DIAGNOSIS — Z6841 Body Mass Index (BMI) 40.0 and over, adult: Secondary | ICD-10-CM

## 2016-12-01 DIAGNOSIS — Z8679 Personal history of other diseases of the circulatory system: Secondary | ICD-10-CM

## 2016-12-01 DIAGNOSIS — I480 Paroxysmal atrial fibrillation: Secondary | ICD-10-CM

## 2016-12-01 DIAGNOSIS — Z9889 Other specified postprocedural states: Secondary | ICD-10-CM

## 2016-12-01 NOTE — Progress Notes (Signed)
ELECTROPHYSIOLOGY CLINIC FOLLOW-UP      REFERRING PHYSICIAN: Dr. Ivan Croft  PRIMARY CARE PHYSICIAN: Haedorn, Ashok Norris, MD .  IDENTIFYING DATA: Caitlin Wiley is an 66 year old (DOB 1950-10-20) female.      History of Present Illness:  Caitlin Wiley is here for follow-up 6 weeks after ablation.  At that time, electro-anatomic mappin showed a fairly normal left atrium.  Pulmonary vein isolation was accomplished without difficulty.  She's had no atrial fibrillation since that event.    [place]:  No hospitalizations or emerency room visits since last seen. No chanes in work or home status.    Problem List:  Patient Active Problem List    Dianosis Date Noted    Chronic anticoaulation 08/02/2015    Riht cervical radiculopathy 05/30/2015    Obesity 05/30/2015    Paroxysmal atrial fibrillation ([place]) 04/17/2014     A. S/p ablation ([place] [facility]) 10/19/2016      GERD (astroesophaeal reflux disease) 11/08/2012    Palpitations 08/21/2012    OA (osteoarthritis) 07/28/2012    Asthma 07/24/2012    Hypercholesterolemia 07/24/2012    Hx of colonic polyps--Dr Read--03/21/10  cleared for 5 years 07/24/2012     Colonoscopy 01/2005      Edema 07/24/2012    Sleep apnea 07/24/2012    HTN (hypertension) 07/24/2012    Menopausal state 07/24/2012    Rash 07/24/2012    Pulmonary nodule 07/24/2012    Grief reaction 07/24/2012     05/11/2011      Strain of thoracic spine 07/24/2012       Current Outpatient Prescriptions   Medication Si Dispense Refill    Albuterol Sulfate HFA (VENTOLIN HFA) 108 (90 Base) MCG/ACT Inhalation Aero Soln Inhale 1-2 puffs by mouth every 6 hours as needed for shortness of breath/wheezin. 18  3    Ascorbic Acid (VITAMIN C OR) 500 m      ASPIRIN OR 81 m      Flaxseed, Linseed, (FLAXSEED OIL) 1200 MG Oral Cap None Entered      Fluticasone Propionate 50 MCG/ACT Nasal Suspension Spray 2 sprays into each nostril daily. 1 Inhaler 3    Fluticasone Propionate HFA (FLOVENT HFA) 110 MCG/ACT Inhalation  Aerosol Inhale 2 puffs by mouth 2 times a day. - RINSE MOUTH AFTER USE 1 Inhaler 5    GLUCOSAMINE CHONDROITIN COMPLX OR       Manesium 400 MG Oral Cap Take 400 m by mouth.      Montelukast Sodium 10 MG Oral Tab Take 1 tablet (10 m) by mouth daily. 34 tablet 11    Multiple Vitamins-Minerals (MULTIVITAMIN OR) no iron      Probiotic Product (PROBIOTIC DAILY OR)       RaNITidine HCl 150 MG Oral Tab Take 1 tablet (150 m) by mouth 2 times a day. 180 tablet 3    Sotalol HCl 80 MG Oral Tab Take 1 tablet (80 m) by mouth 2 times a day. 180 tablet 3    Triamterene-HCTZ 37.5-25 MG Oral Tab TAKE ONE TABLET BY MOUTH ONCE DAILY 90 tablet 2    Warfarin Sodium (COUMADIN) 5 MG Oral Tab Take 7.5m  X 1 day a week (Tu) and 5m  X 6 days a week or as instructed by your physician. 110 tablet 2     No current facility-administered medications for this visit.        Alleries, medications, medical, surical, family, and social histories are reviewed and updated in the chart as appropriate.  Comprehensive review of systems is obtained and is Otherwise negative.          PHYSICAL EXAMINATION   Constitutional: Morbidly obese middle-aged woman in no distress  Vital signs: BP 118/82    Pulse 68    Ht 5' 6.5" (1.689 m) Comment: pt provided   Wt (!) 314 lb 14.4 oz (142.8 kg)    BMI 50.06 kg/m   Chest:  Respirations are not labored. Lungs are clear to auscultation  Cardiac:  Regular rhythm.  There is no murmur, no gallop.  Extremities:   Trace leg edema.  Skin: warm and dry.  Psychiatric: normal affect. Oriented to time and place.    ECG: Sinus at 60.  QTC is 450 ms    Impression:  Doing well early after ablation.  She is confident that she would recognize recurrence based on past experience.  I would like to treat her with antiarrhythmic drug therapy for 3 months after her ablation.    Plan:  Continue sotalol for another 6 weeks  CAM monitor after discontinuing sotalol  Follow-up after CAM monitor-if no recurrent AF off sotalol  or asymptomatic AF on monitor, discontinue warfarin.    Signed by  Wyonia Hough, MD     CC: Hagedorn, Ashok Norris, MD

## 2016-12-01 NOTE — Patient Instructions (Signed)
1. Stop sotalol in 6 weeks.  2. Call to schedule a CAM monitor after being off of sotalol for one week  3. Follow up with me after CAM

## 2016-12-13 ENCOUNTER — Encounter (HOSPITAL_BASED_OUTPATIENT_CLINIC_OR_DEPARTMENT_OTHER): Payer: Medicare HMO

## 2016-12-14 ENCOUNTER — Encounter (HOSPITAL_BASED_OUTPATIENT_CLINIC_OR_DEPARTMENT_OTHER): Payer: Medicare HMO

## 2016-12-15 ENCOUNTER — Ambulatory Visit: Payer: Medicare HMO | Admitting: Unknown Physician Specialty

## 2016-12-15 DIAGNOSIS — I480 Paroxysmal atrial fibrillation: Secondary | ICD-10-CM | POA: Insufficient documentation

## 2016-12-15 LAB — PR PROTHROMBIN TIME, ONSITE: prothrombin INR: 2.6

## 2016-12-15 NOTE — Progress Notes (Signed)
Anticoag Treatment Plan  As of 12/15/2016    INR goal:   2.0-3.0   TTR:   87.5 % (3.3 y)   INR used for dosing:   2.6 (12/15/2016)   Full warfarin instructions:   5 mg every day   Weekly warfarin total:   35 mg   Next INR check:   01/12/2017   Target end date:            Anticoagulation Episode Summary     INR check location:       Send INR reminders to:   [facility] EDM CLINICAL SUPPORT STAFF POOL    Comments:               SUBJECTIVE    Patient Findings     Negatives:   Signs/symptoms of thrombosis, Signs/symptoms of bleeding, Laboratory test error suspected, Change in health, Change in alcohol use, Change in activity, Upcoming invasive procedure, Emergency department visit, Upcoming dental procedure, Missed doses, Extra doses, Change in medications, Change in diet/appetite, Hospital admission, Bruising, Other complaints          OBJECTIVE    Anticoag Visit Date 12/15/2016 11/18/2016 11/09/2016 11/02/2016 10/26/2016 10/14/2016 10/07/2016   INR Goal 2.0-3.0 2.0-3.0 2.0-3.0 2.0-3.0 2.0-3.0 2.0-3.0 2.0-3.0   Selected INR 2.6 2.2 2.6 3.3 2.5 2.0 2.8   INR Date 12/15/2016 11/18/2016 11/09/2016 11/02/2016 10/26/2016 10/14/2016 10/07/2016   Previous Week Total 35 mg 35 mg 35 mg 35 mg 35 mg 35 mg 35 mg   Pt Deviation No No No No No No No   Pt Findings Comments - - - Had less greens last week  S/p ablation 10/19/16 A. Fib ablation on 10/2 A. Fib ablation on 10/19/16, Dr. Rene Paci would like INR between 2.0-2.5   Next Week Total 35 mg 35 mg 35 mg 35 mg 35 mg 35 mg 35 mg   Full Instructions 5 mg every day 5 mg every day 5 mg every day 5 mg every day 5 mg every day 5 mg every day 5 mg every day   Date of Next INR 01/12/2017 12/18/2016 11/18/2016 11/09/2016 11/02/2016 10/26/2016 10/14/2016       ASSESSMENT    Therapeutic INR in stable patient and without complications.     PLAN    1.  S/p atrial fibrillation ablation on 10/19/16.  Continue Warfarin 5 mg daily and re check INR in four weeks.  Reviewed per MD protocol.         .  As of 12/15/2016     Warfarin maintenance plan:   5 mg every day   Full warfarin instructions:   5 mg every day   Weekly warfarin total:   35 mg   No change documented:   [name], RN   Next INR check:   01/12/2017         Anticoagulation Episode Summary     Comments:             2. Caitlin Wiley acknowledged understanding of this plan      PATIENT INSTRUCTIONS    November 2018 Details    Sun Mon Tue Wed Thu Fri Sat         1               2               3                  4  5               6               7               8               9               10                 11               12               13               14               15               16               17                 18               19               20               21               22               23               24                 25               26               27               28      5  mg   See details      29      5 mg         30      5 mg           Date Details   11/28 This INR check                 December 2018 Details    Sun Mon Tue Wed Thu Fri Sat           1      5 mg           2      5 mg         3      5 mg         4      5 mg         5      5 mg         6      5 mg         7      5 mg         8      5 mg           9      5 mg         10  5 mg         11      5 mg         12      5 mg         13      5 mg         14      5 mg         15      5 mg           16      5 mg         17      5 mg         18      5 mg         19      5 mg         20      5 mg         21      5 mg         22      5 mg           23      5 mg         24      5 mg         25      5 mg         26      5 mg         27               28               29                 30               31                      Date Details   No additional details    Date of next INR:  01/12/2017

## 2016-12-25 ENCOUNTER — Encounter (INDEPENDENT_AMBULATORY_CARE_PROVIDER_SITE_OTHER): Payer: Self-pay

## 2016-12-25 DIAGNOSIS — M25569 Pain in unspecified knee: Secondary | ICD-10-CM

## 2016-12-25 DIAGNOSIS — G8929 Other chronic pain: Secondary | ICD-10-CM

## 2016-12-25 NOTE — Telephone Encounter (Signed)
[place]--please load the requested referraL

## 2016-12-27 NOTE — Telephone Encounter (Signed)
Please sign referral. Pt notified

## 2016-12-27 NOTE — Addendum Note (Signed)
Addended by: Lenise Herald on: 12/27/2016 08:51 AM     Modules accepted: Orders

## 2016-12-28 NOTE — Addendum Note (Signed)
Addended by: Corey Harold on: 12/28/2016 10:53 AM     Modules accepted: Orders

## 2017-01-04 NOTE — Telephone Encounter (Signed)
Referral was faxed to the office on 12/11. The Josem Kaufmann was done by [facility] 12/18  notes are in the referral . Closing TE

## 2017-01-13 ENCOUNTER — Ambulatory Visit: Payer: Medicare HMO

## 2017-01-13 DIAGNOSIS — I480 Paroxysmal atrial fibrillation: Secondary | ICD-10-CM | POA: Insufficient documentation

## 2017-01-13 LAB — PR PROTHROMBIN TIME, ONSITE: prothrombin INR: 3.4

## 2017-01-13 NOTE — Progress Notes (Signed)
Anticoag Treatment Plan  As of 01/13/2017    INR goal:   2.0-3.0   TTR:   86.6 % (3.4 y)   INR used for dosing:   3.4! (01/13/2017)   Full warfarin instructions:   5 mg every day   Weekly warfarin total:   35 mg   Next INR check:   01/24/2017   Target end date:            Anticoagulation Episode Summary     INR check location:       Send INR reminders to:   [facility] EDM CLINICAL SUPPORT STAFF POOL    Comments:               SUBJECTIVE    Patient Findings     Negatives:   Signs/symptoms of thrombosis, Signs/symptoms of bleeding, Laboratory test error suspected, Change in health, Change in alcohol use, Change in activity, Upcoming invasive procedure, Emergency department visit, Upcoming dental procedure, Missed doses, Extra doses, Change in medications, Change in diet/appetite, Hospital admission, Bruising, Other complaints          OBJECTIVE    Anticoag Visit Date 01/13/2017 12/15/2016 11/18/2016 11/09/2016 11/02/2016 10/26/2016 10/14/2016   INR Goal 2.0-3.0 2.0-3.0 2.0-3.0 2.0-3.0 2.0-3.0 2.0-3.0 2.0-3.0   Selected INR 3.4 2.6 2.2 2.6 3.3 2.5 2.0   INR Date 01/13/2017 12/15/2016 11/18/2016 11/09/2016 11/02/2016 10/26/2016 10/14/2016   Previous Week Total 35 mg 35 mg 35 mg 35 mg 35 mg 35 mg 35 mg   Pt Deviation No No No No No No No   Pt Findings Comments - - - - Had less greens last week  S/p ablation 10/19/16 A. Fib ablation on 10/2   Next Week Total 35 mg 35 mg 35 mg 35 mg 35 mg 35 mg 35 mg   Full Instructions 5 mg every day 5 mg every day 5 mg every day 5 mg every day 5 mg every day 5 mg every day 5 mg every day   Date of Next INR 01/24/2017 01/12/2017 12/18/2016 11/18/2016 11/09/2016 11/02/2016 10/26/2016       ASSESSMENT    Elevated INR without apparent cause and without complications.     PLAN    1.  Continue Warfarin;  Hold x1 then, continue Warfarin 5 mg daily and re check INR in 1 week.   .  As of 01/13/2017    Warfarin maintenance plan:   5 mg every day   Full warfarin instructions:   5 mg every day   Weekly warfarin total:    35 mg   No change documented:   Ardelle Park, RN   Next INR check:   01/24/2017         Anticoagulation Episode Summary     Comments:             2. Caitlin Wiley acknowledged understanding of this plan      PATIENT INSTRUCTIONS    December 2018 Details    Sun Mon Tue Wed Thu Fri Sat           1                 2               3               4                5  6               7               8                 9               10               11               12               13               14               15                 16               17               18               19               20               21               22                 23               24               25               26               27      5  mg   See details      28      5 mg         29      5 mg           30      5 mg         31      5 mg               Date Details   12/27 This INR check                 January 2019 Details    Sun Mon Tue Wed Thu Fri Sat       1      5 mg         2      5 mg         3      5 mg         4      5 mg         5      5 mg           6      5 mg         7      5 mg         8               9               10  11               12                 13               14               15               16               17               18               19                 20               21               22               23               24               25               26                 27               28               29               30               31                   Date Details   No additional details    Date of next INR:  01/24/2017

## 2017-01-14 NOTE — Progress Notes (Signed)
Reviewed INR data and agree with warfarin dosing.

## 2017-01-24 ENCOUNTER — Ambulatory Visit: Payer: Medicare HMO

## 2017-01-24 DIAGNOSIS — I480 Paroxysmal atrial fibrillation: Secondary | ICD-10-CM

## 2017-01-24 LAB — PR PROTHROMBIN TIME, ONSITE: prothrombin INR: 2.4

## 2017-01-24 NOTE — Progress Notes (Signed)
Anticoag Treatment Plan  As of 01/24/2017    INR goal:   2.0-3.0   TTR:   86.4 % (3.4 y)   INR used for dosing:   2.4 (01/24/2017)   Full warfarin instructions:   5 mg every day   Weekly warfarin total:   35 mg   Next INR check:   02/09/2017   Target end date:            Anticoagulation Episode Summary     INR check location:       Send INR reminders to:   [facility] EDM CLINICAL SUPPORT STAFF POOL    Comments:               SUBJECTIVE    Patient Findings     Negatives:   Signs/symptoms of thrombosis, Signs/symptoms of bleeding, Laboratory test error suspected, Change in health, Change in alcohol use, Change in activity, Upcoming invasive procedure, Emergency department visit, Upcoming dental procedure, Missed doses, Extra doses, Change in medications, Change in diet/appetite, Hospital admission, Bruising, Other complaints          OBJECTIVE    Anticoag Visit Date 01/24/2017 01/13/2017 12/15/2016 11/18/2016 11/09/2016 11/02/2016 10/26/2016   INR Goal 2.0-3.0 2.0-3.0 2.0-3.0 2.0-3.0 2.0-3.0 2.0-3.0 2.0-3.0   Selected INR 2.4 3.4 2.6 2.2 2.6 3.3 2.5   INR Date 01/24/2017 01/13/2017 12/15/2016 11/18/2016 11/09/2016 11/02/2016 10/26/2016   Previous Week Total 35 mg 35 mg 35 mg 35 mg 35 mg 35 mg 35 mg   Pt Deviation No No No No No No No   Pt Findings Comments - - - - - Had less greens last week  S/p ablation 10/19/16   Next Week Total 35 mg 35 mg 35 mg 35 mg 35 mg 35 mg 35 mg   Full Instructions 5 mg every day 5 mg every day 5 mg every day 5 mg every day 5 mg every day 5 mg every day 5 mg every day   Date of Next INR 02/09/2017 01/24/2017 01/12/2017 12/18/2016 11/18/2016 11/09/2016 11/02/2016       ASSESSMENT    Therapeutic INR in stable patient and without complications.     PLAN    1.  Continue Warfarin;  5 mg daily and re check INR in 2 weeks.    .  As of 01/24/2017    Warfarin maintenance plan:   5 mg every day   Full warfarin instructions:   5 mg every day   Weekly warfarin total:   35 mg   No change documented:   Ardelle Park, RN      Next INR check:   02/09/2017         Anticoagulation Episode Summary     Comments:             2. Arrielle Mcginn acknowledged understanding of this plan      PATIENT INSTRUCTIONS    January 2019 Details    Sun Glori Luis Thu Fri Sat       1               2               3               4               5                  6  7      5 mg   See details      8      5 mg         9      5 mg         10      5 mg         11      5 mg         12      5 mg           13      5 mg         14      5 mg         15      5 mg         16      5 mg         17      5 mg         18      5 mg         19      5 mg           20      5 mg         21      5 mg         22      5 mg         23      5 mg         24               25               26                 27               28               29               30               31                   Date Details   01/07 This INR check       Date of next INR:  02/09/2017

## 2017-01-27 ENCOUNTER — Other Ambulatory Visit (INDEPENDENT_AMBULATORY_CARE_PROVIDER_SITE_OTHER): Payer: Medicare HMO

## 2017-01-27 DIAGNOSIS — I4891 Unspecified atrial fibrillation: Secondary | ICD-10-CM

## 2017-01-27 NOTE — Progress Notes (Signed)
CAM Holter Monitor S/N [id number]    Ordering/ Referring: Kizzie Furnish, MD    Reading/Interpreting: Kizzie Furnish, MD    Dx: Atrial Fibrillation     Hook Up Tech:NGoslin     Duration: 7 days     Special Instructions: N/A    Explained to patient how to wear and utilize the functions of the monitor. The patient understood not to get the monitor wet and to bring the monitor and all associated equipment back to the clinic. Patient understood and signed the order form listing the patients responsibility regarding the monitor.    The patient had no further questions and left with all needed equipment for the monitoring period.    Caitlin Wiley

## 2017-02-08 ENCOUNTER — Ambulatory Visit: Payer: Medicare HMO

## 2017-02-08 DIAGNOSIS — I480 Paroxysmal atrial fibrillation: Secondary | ICD-10-CM | POA: Insufficient documentation

## 2017-02-08 LAB — PR PROTHROMBIN TIME, ONSITE: prothrombin INR: 3.2

## 2017-02-08 NOTE — Progress Notes (Signed)
Anticoag Treatment Plan  As of 02/08/2017    INR goal:   2.0-3.0   TTR:   86.2 % (3.4 y)   INR used for dosing:   3.2! (02/08/2017)   Full warfarin instructions:   5 mg every day   Weekly warfarin total:   35 mg   Next INR check:   02/23/2017   Target end date:            Anticoagulation Episode Summary     INR check location:       Send INR reminders to:   [facility] EDM CLINICAL SUPPORT STAFF POOL    Comments:               SUBJECTIVE    Patient Findings     Negatives:   Signs/symptoms of thrombosis, Signs/symptoms of bleeding, Laboratory test error suspected, Change in health, Change in alcohol use, Change in activity, Upcoming invasive procedure, Emergency department visit, Upcoming dental procedure, Missed doses, Extra doses, Change in medications, Change in diet/appetite, Hospital admission, Bruising, Other complaints          OBJECTIVE    Anticoag Visit Date 02/08/2017 01/24/2017 01/13/2017 12/15/2016 11/18/2016 11/09/2016 11/02/2016   INR Goal 2.0-3.0 2.0-3.0 2.0-3.0 2.0-3.0 2.0-3.0 2.0-3.0 2.0-3.0   Selected INR 3.2 2.4 3.4 2.6 2.2 2.6 3.3   INR Date 02/08/2017 01/24/2017 01/13/2017 12/15/2016 11/18/2016 11/09/2016 11/02/2016   Previous Week Total 35 mg 35 mg 35 mg 35 mg 35 mg 35 mg 35 mg   Pt Deviation No No No No No No No   Pt Findings Comments - - - - - - Had less greens last week    Next Week Total 35 mg 35 mg 35 mg 35 mg 35 mg 35 mg 35 mg   Full Instructions 5 mg every day 5 mg every day 5 mg every day 5 mg every day 5 mg every day 5 mg every day 5 mg every day   Date of Next INR 02/23/2017 02/09/2017 01/24/2017 01/12/2017 12/18/2016 11/18/2016 11/09/2016       ASSESSMENT    Elevated INR in association with decreased greens intake and without complications.     PLAN    1.  Continue Warfarin  5 mg daily and re check INR in 2 weeks.  Pt has been stable on current dosing for extended period of time, she will increase greens x1.  .  As of 02/08/2017    Warfarin maintenance plan:   5 mg every day   Full warfarin instructions:   5 mg  every day   Weekly warfarin total:   35 mg   No change documented:   Ardelle Park, RN   Next INR check:   02/23/2017         Anticoagulation Episode Summary     Comments:             2. Caitlin Wiley acknowledged understanding of this plan      PATIENT INSTRUCTIONS    January 2019 Details    Sun Mon Tue Wed Thu Fri Sat       1               2               3               4                5  6               7               8               9               10               11               12                 13               14               15               16               17               18               19                 20               21               22      5  mg   See details      23      5 mg         24      5 mg         25      5 mg         26      5 mg           27      5 mg         28      5 mg         29      5 mg         30      5 mg         31      5 mg            Date Details   01/22 This INR check                 February 2019 Details    Sun Mon Tue Wed Thu Fri Sat          1      5 mg         2      5 mg           3      5 mg         4      5 mg         5      5 mg         6      5 mg         7               8               9  10               11               12               13               14               15               16                 17               18               19               20               21               22               23                 24               25               26               27               28                   Date Details   No additional details    Date of next INR:  02/23/2017

## 2017-02-08 NOTE — Progress Notes (Signed)
I agree

## 2017-02-16 ENCOUNTER — Other Ambulatory Visit: Payer: Self-pay

## 2017-02-16 DIAGNOSIS — J4520 Mild intermittent asthma, uncomplicated: Secondary | ICD-10-CM

## 2017-02-17 MED ORDER — MONTELUKAST SODIUM 10 MG OR TABS
ORAL_TABLET | ORAL | 1 refills | Status: DC
Start: 2017-02-17 — End: 2017-08-23

## 2017-02-22 ENCOUNTER — Encounter (HOSPITAL_BASED_OUTPATIENT_CLINIC_OR_DEPARTMENT_OTHER): Payer: Medicare HMO

## 2017-02-23 ENCOUNTER — Encounter (INDEPENDENT_AMBULATORY_CARE_PROVIDER_SITE_OTHER): Payer: Medicare HMO

## 2017-02-24 ENCOUNTER — Ambulatory Visit: Payer: Medicare HMO | Attending: Internal Medicine

## 2017-02-24 DIAGNOSIS — I480 Paroxysmal atrial fibrillation: Secondary | ICD-10-CM | POA: Insufficient documentation

## 2017-02-24 LAB — PR PROTHROMBIN TIME, ONSITE: prothrombin INR: 2.9

## 2017-02-24 NOTE — Progress Notes (Signed)
Anticoag Treatment Plan  As of 02/24/2017    INR goal:   2.0-3.0   TTR:   85.6 % (3.5 y)   INR used for dosing:   2.9 (02/24/2017)   Full warfarin instructions:   5 mg every day   Weekly warfarin total:   35 mg   Next INR check:   03/10/2017   Target end date:            Anticoagulation Episode Summary     INR check location:       Send INR reminders to:   [facility] EDM CLINICAL SUPPORT STAFF POOL    Comments:               SUBJECTIVE    Patient Findings     Negatives:   Signs/symptoms of thrombosis, Signs/symptoms of bleeding, Laboratory test error suspected, Change in health, Change in alcohol use, Change in activity, Upcoming invasive procedure, Emergency department visit, Upcoming dental procedure, Missed doses, Extra doses, Change in medications, Change in diet/appetite, Hospital admission, Bruising, Other complaints          OBJECTIVE    Anticoag Visit Date 02/24/2017 02/08/2017 01/24/2017 01/13/2017 12/15/2016 11/18/2016 11/09/2016   INR Goal 2.0-3.0 2.0-3.0 2.0-3.0 2.0-3.0 2.0-3.0 2.0-3.0 2.0-3.0   Selected INR 2.9 3.2 2.4 3.4 2.6 2.2 2.6   INR Date 02/24/2017 02/08/2017 01/24/2017 01/13/2017 12/15/2016 11/18/2016 11/09/2016   Previous Week Total 35 mg 35 mg 35 mg 35 mg 35 mg 35 mg 35 mg   Pt Deviation No No No No No No No   Pt Findings Comments - - - - - - -   Next Week Total 35 mg 35 mg 35 mg 35 mg 35 mg 35 mg 35 mg   Full Instructions 5 mg every day 5 mg every day 5 mg every day 5 mg every day 5 mg every day 5 mg every day 5 mg every day   Date of Next INR 03/10/2017 02/23/2017 02/09/2017 01/24/2017 01/12/2017 12/18/2016 11/18/2016       ASSESSMENT    Therapeutic INR in stable patient and without complications.     PLAN    1.  Continue Warfarin  5 mg daily and re check INR in 2 weeks.    .  As of 02/24/2017    Warfarin maintenance plan:   5 mg every day   Full warfarin instructions:   5 mg every day   Weekly warfarin total:   35 mg   No change documented:   Ardelle Park, RN   Next INR check:   03/10/2017         Anticoagulation  Episode Summary     Comments:             2. Geniyah Eischeid acknowledged understanding of this plan      PATIENT INSTRUCTIONS    February 2019 Details    Sun Glori Luis Thu Fri Sat          1               2                 3               4               5                6  7      5 mg   See details      8      5 mg         9      5 mg           10      5 mg         11      5 mg         12      5 mg         13      5 mg         14      5 mg         15      5 mg         16      5 mg           17      5 mg         18      5 mg         19      5 mg         20      5 mg         21      5 mg         22               23                 24               25               26               27               28                   Date Details   02/07 This INR check       Date of next INR:  03/10/2017

## 2017-03-09 ENCOUNTER — Telehealth (INDEPENDENT_AMBULATORY_CARE_PROVIDER_SITE_OTHER): Payer: Self-pay

## 2017-03-09 DIAGNOSIS — I480 Paroxysmal atrial fibrillation: Secondary | ICD-10-CM

## 2017-03-09 NOTE — Telephone Encounter (Signed)
Name / Address of provider to be seen: [facility]   Diagnosis or symptoms to be addressed: Ablation done in Oct   Has patient been seen by this provider before? Yes   Date of future appointment (if scheduled): yes  ACN Yes/No?:  no  Description of why this is needed AND/OR additional notes: Needs updated referral for [place] [facility] please do asap

## 2017-03-09 NOTE — Telephone Encounter (Signed)
If okay, please sign pending referral

## 2017-03-10 ENCOUNTER — Ambulatory Visit: Payer: Medicare HMO | Attending: Internal Medicine | Admitting: Unknown Physician Specialty

## 2017-03-10 DIAGNOSIS — I480 Paroxysmal atrial fibrillation: Secondary | ICD-10-CM | POA: Insufficient documentation

## 2017-03-10 LAB — PROTHROMBIN TIME
Prothrombin INR: 4 — ABNORMAL HIGH (ref 0.8–1.3)
Prothrombin Time Patient: 39.1 s — ABNORMAL HIGH (ref 10.7–15.6)

## 2017-03-10 NOTE — Progress Notes (Signed)
Anticoag Treatment Plan  As of 03/10/2017    INR goal:   2.0-3.0   TTR:   84.7 % (3.5 y)   INR used for dosing:   4.0! (03/10/2017)   Full warfarin instructions:   2.5 mg every Mon; 5 mg all other days   Weekly warfarin total:   32.5 mg   Next INR check:   03/10/2017   Target end date:            Anticoagulation Episode Summary     INR check location:       Send INR reminders to:   [facility] EDM CLINICAL SUPPORT STAFF POOL    Comments:               SUBJECTIVE    Patient Findings     Positives:   Change in medications    Negatives:   Signs/symptoms of thrombosis, Signs/symptoms of bleeding, Laboratory test error suspected, Change in health, Change in alcohol use, Change in activity, Upcoming invasive procedure, Emergency department visit, Upcoming dental procedure, Missed doses, Extra doses, Change in diet/appetite, Hospital admission, Bruising, Other complaints    Comments:   Has been taking Tylenol 650 mg twice daily for arthritis for the last two months          OBJECTIVE    Anticoag Visit Date 03/10/2017 02/24/2017 02/08/2017 01/24/2017 01/13/2017 12/15/2016 11/18/2016   INR Goal 2.0-3.0 2.0-3.0 2.0-3.0 2.0-3.0 2.0-3.0 2.0-3.0 2.0-3.0   Selected INR 4.0 2.9 3.2 2.4 3.4 2.6 2.2   INR Date 03/10/2017 02/24/2017 02/08/2017 01/24/2017 01/13/2017 12/15/2016 11/18/2016   Previous Week Total 35 mg 35 mg 35 mg 35 mg 35 mg 35 mg 35 mg   Pt Deviation No No No No No No No   Pt Findings Comments Has been taking Tylenol 650 mg twice daily for arthritis for the last two months - - - - - -   Next Week Total 32.5 mg 35 mg 35 mg 35 mg 35 mg 35 mg 35 mg   Full Instructions 2.5 mg every Mon; 5 mg all other days 5 mg every day 5 mg every day 5 mg every day 5 mg every day 5 mg every day 5 mg every day   Date of Next INR - 03/10/2017 02/23/2017 02/09/2017 01/24/2017 01/12/2017 12/18/2016       ASSESSMENT    Elevated INR in association with taking Tylenol 650 mg twice daily for the last two months for arthritis  and without complications.     PLAN    1.  Hold x  1, decrease Warfarin to 2.5 mg x 1 day a week (M) and 5 mg x 6 days a week.  Re check INR in 1 week.  Reviewed per MD protocol.  Routing to Dr. Dorothyann Peng for review and signature.           .  As of 03/10/2017    Warfarin maintenance plan:   2.5 mg every Mon; 5 mg all other days   Full warfarin instructions:   2.5 mg every Mon; 5 mg all other days   Weekly warfarin total:   32.5 mg   Next INR check:   03/10/2017         Anticoagulation Episode Summary     Comments:             2. Left vm with above instructions.     03/11/17 - patient called back and confirmed she got the above instructions.  PATIENT INSTRUCTIONS    February 2019 Details    Sun Molli Knock Tue Wed Thu Fri Sat          1               2                 3               4               5               6               7               8               9                 10               11               12               13               14               15               16                 17               18               19               20               21      5  mg   See details      22      5 mg         23      5 mg           24      5 mg         25      2.5 mg         26      5 mg         27      5 mg         28      5 mg            Date Details   02/21 This INR check      Date of next INR: No date specified

## 2017-03-10 NOTE — Progress Notes (Signed)
POC INR = 5.1, venous sample sent.  Reports she has been taking Tylenol 650 mg BID daily for the last 2 months for arthritis.     Yates Center, RN

## 2017-03-11 NOTE — Progress Notes (Signed)
Agree 

## 2017-03-17 ENCOUNTER — Other Ambulatory Visit (HOSPITAL_BASED_OUTPATIENT_CLINIC_OR_DEPARTMENT_OTHER): Payer: Self-pay | Admitting: Unknown Physician Specialty

## 2017-03-17 ENCOUNTER — Ambulatory Visit: Payer: Medicare HMO | Attending: Internal Medicine | Admitting: Unknown Physician Specialty

## 2017-03-17 DIAGNOSIS — I480 Paroxysmal atrial fibrillation: Secondary | ICD-10-CM

## 2017-03-17 LAB — PROTHROMBIN TIME
Prothrombin INR: 2.8 — ABNORMAL HIGH (ref 0.8–1.3)
Prothrombin Time Patient: 29.6 s — ABNORMAL HIGH (ref 10.7–15.6)

## 2017-03-17 NOTE — Progress Notes (Signed)
Anticoag Treatment Plan  As of 03/17/2017    INR goal:   2.0-3.0   TTR:   84.3 % (3.5 y)   INR used for dosing:   2.8 (03/17/2017)   Full warfarin instructions:   2.5 mg every Mon; 5 mg all other days   Weekly warfarin total:   32.5 mg   Next INR check:   03/31/2017   Target end date:            Anticoagulation Episode Summary     INR check location:       Send INR reminders to:   [facility] EDM CLINICAL SUPPORT STAFF POOL    Comments:               SUBJECTIVE    Patient Findings     Negatives:   Signs/symptoms of thrombosis, Signs/symptoms of bleeding, Laboratory test error suspected, Change in health, Change in alcohol use, Change in activity, Upcoming invasive procedure, Emergency department visit, Upcoming dental procedure, Missed doses, Extra doses, Change in medications, Change in diet/appetite, Hospital admission, Bruising, Other complaints          OBJECTIVE    Anticoag Visit Date 03/17/2017 03/10/2017 02/24/2017 02/08/2017 01/24/2017 01/13/2017 12/15/2016   INR Goal 2.0-3.0 2.0-3.0 2.0-3.0 2.0-3.0 2.0-3.0 2.0-3.0 2.0-3.0   Selected INR 2.8 4.0 2.9 3.2 2.4 3.4 2.6   INR Date 03/17/2017 03/10/2017 02/24/2017 02/08/2017 01/24/2017 01/13/2017 12/15/2016   Previous Week Total 32.5 mg 35 mg 35 mg 35 mg 35 mg 35 mg 35 mg   Pt Deviation No No No No No No No   Pt Findings Comments - Has been taking Tylenol 650 mg twice daily for arthritis for the last two months - - - - -   Next Week Total 32.5 mg 32.5 mg 35 mg 35 mg 35 mg 35 mg 35 mg   Full Instructions 2.5 mg every Mon; 5 mg all other days 2.5 mg every Mon; 5 mg all other days 5 mg every day 5 mg every day 5 mg every day 5 mg every day 5 mg every day   Date of Next INR 03/31/2017 - 03/10/2017 02/23/2017 02/09/2017 01/24/2017 01/12/2017       ASSESSMENT    Therapeutic INR following warfarin dose adjustment and without complications.     PLAN    1.  Continue Warfarin 2.5 mg x 1 day a week (M) and 5 mg x 6 days a week.  Re check INR in 2 weeks.  Reviewed per MD protocol.        .  As of 03/17/2017     Warfarin maintenance plan:   2.5 mg every Mon; 5 mg all other days   Full warfarin instructions:   2.5 mg every Mon; 5 mg all other days   Weekly warfarin total:   32.5 mg   Next INR check:   03/31/2017         Anticoagulation Episode Summary     Comments:             2. Lavena Loretto acknowledged understanding of this plan      PATIENT INSTRUCTIONS    February 2019 Details    Sun Mon Tue Wed Thu Fri Sat          1               2                 3  4               5               6               7               8               9                 10               11               12               13               14               15               16                 17               18               19               20               21               22               23                 24               25               26               27               28      5  mg   See details         Date Details   02/28 This INR check                 March 2019 Details    Sun Mon Tue Wed Thu Fri Sat          1      5 mg         2      5 mg           3      5 mg         4      2.5 mg         5      5 mg         6      5 mg         7      5 mg         8      5 mg         9      5 mg           10      5 mg         11  2.5 mg         12      5 mg         13      5 mg         14      5 mg         15               16                 17               18               19               20               21               22               23                 24               25               26               27               28               29               30                 31                       Date Details   No additional details    Date of next INR:  03/31/2017

## 2017-03-23 ENCOUNTER — Encounter (INDEPENDENT_AMBULATORY_CARE_PROVIDER_SITE_OTHER): Payer: Self-pay

## 2017-03-23 ENCOUNTER — Ambulatory Visit (INDEPENDENT_AMBULATORY_CARE_PROVIDER_SITE_OTHER): Payer: Medicare HMO

## 2017-03-23 VITALS — BP 120/82 | HR 84 | Ht 66.5 in | Wt 320.9 lb

## 2017-03-23 DIAGNOSIS — Z6841 Body Mass Index (BMI) 40.0 and over, adult: Secondary | ICD-10-CM

## 2017-03-23 DIAGNOSIS — I480 Paroxysmal atrial fibrillation: Secondary | ICD-10-CM

## 2017-03-23 NOTE — Progress Notes (Signed)
ELECTROPHYSIOLOGY CLINIC FOLLOW-UP      REFERRING PHYSICIAN: Dr. Ernestina Penna  PRIMARY CARE PHYSICIAN: Haedorn, Ashok Norris, MD   IDENTIFYING DATA: Caitlin Wiley is an 67 year old (DOB 07/21/1950) female.      History of Present Illness:  Caitlin Wiley is here for follow-up 5 months after ablation.  She's had no recurrent atrial fibrillation.  Sotalol was discontinued 3 months ao.  Ambulatory monitorin in January sodium no atrial fibrillation.  She is confident that she would reconize recurrence.    There've been no hospitalizations or emerency room visits since last seen. No chanes in work or home status.    Problem List:  Patient Active Problem List    Dianosis Date Noted    Chronic anticoaulation 08/02/2015    Riht cervical radiculopathy 05/30/2015    Obesity 05/30/2015    Paroxysmal atrial fibrillation ([place]) 04/17/2014     A. S/p ablation ([facility]) 10/19/2016      GERD (astroesophaeal reflux disease) 11/08/2012    Palpitations 08/21/2012    OA (osteoarthritis) 07/28/2012    Asthma 07/24/2012    Hypercholesterolemia 07/24/2012    Hx of colonic polyps--Dr Read--03/21/10  cleared for 5 years 07/24/2012     Colonoscopy 01/2005      Edema 07/24/2012    Sleep apnea 07/24/2012    HTN (hypertension) 07/24/2012    Menopausal state 07/24/2012    Rash 07/24/2012    Pulmonary nodule 07/24/2012    Grief reaction 07/24/2012     05/11/2011      Strain of thoracic spine 07/24/2012       Current Outpatient Medications   Medication Si Dispense Refill    Acetaminophen (TYLENOL) 325 MG Oral Tab Take 325 m by mouth every 6 hours as needed.      Albuterol Sulfate HFA (VENTOLIN HFA) 108 (90 Base) MCG/ACT Inhalation Aero Soln Inhale 1-2 puffs by mouth every 6 hours as needed for shortness of breath/wheezin. 18  3    Flaxseed, Linseed, (FLAXSEED OIL) 1200 MG Oral Cap None Entered      Fluticasone Propionate 50 MCG/ACT Nasal Suspension Spray 2 sprays into each nostril daily. 1 Inhaler 3    Fluticasone  Propionate HFA (FLOVENT HFA) 110 MCG/ACT Inhalation Aerosol Inhale 2 puffs by mouth 2 times a day. - RINSE MOUTH AFTER USE 1 Inhaler 5    GLUCOSAMINE CHONDROITIN COMPLX OR       Manesium 400 MG Oral Cap Take 400 m by mouth.      Montelukast Sodium 10 MG Oral Tab TAKE ONE TABLET BY MOUTH DAILY 90 tablet 1    Multiple Vitamins-Minerals (MULTIVITAMIN OR) no iron      Probiotic Product (PROBIOTIC DAILY OR)       RaNITidine HCl 150 MG Oral Tab Take 1 tablet (150 m) by mouth 2 times a day. 180 tablet 3    Triamterene-HCTZ 37.5-25 MG Oral Tab TAKE ONE TABLET BY MOUTH ONCE DAILY 90 tablet 2    Warfarin Sodium (COUMADIN) 5 MG Oral Tab Take 7.5m  X 1 day a week (Tu) and 5m  X 6 days a week or as instructed by your physician. (Patient takin differently: Take 2.5m  X 1 day a week (Tu) and 5m  X 6 days a week or as instructed by your physician.) 110 tablet 2     No current facility-administered medications for this visit.        Alleries, medications, medical, surical, family, and social histories are reviewed and updated in the chart as appropriate.  Comprehensive review of systems is obtained and is otherwise negative    PHYSICAL EXAMINATION   Constitutional: Morbidly obese middle-aged woman in no distresss: BP 120/82    Pulse 84    Ht 5' 6.5" (1.689 m) Comment: pt provided   Wt (!) 320 lb 14.4 oz (145.6 kg)    BMI 51.02 kg/m   Chest:  Respirations are not labored. Lungs are clear to auscultation  Cardiac : regular rhythm. There is no murmur, no gallop.  Extremities:   1 plus leg edema.  Skin: warm and dry.  Psychiatric: normal affect. Oriented to time and place.    Impression:  Doing very well 5 months after ablation and now 3 months after stopping antiarrhythmic drug therapy.    Plan:  Continue anticoagulation for now.  If maintaining sinus rhythm at 1 year, some consideration could be given to stopping anticoagulation.  EP follow-up as deemed appropriate by Dr. Dorothyann Peng    Signed by  Wyonia Hough, MD      CC:

## 2017-03-28 ENCOUNTER — Ambulatory Visit: Payer: Medicare HMO | Attending: Internal Medicine | Admitting: Internal Medicine

## 2017-03-28 ENCOUNTER — Ambulatory Visit (HOSPITAL_BASED_OUTPATIENT_CLINIC_OR_DEPARTMENT_OTHER): Payer: Medicare HMO

## 2017-03-28 ENCOUNTER — Encounter (HOSPITAL_BASED_OUTPATIENT_CLINIC_OR_DEPARTMENT_OTHER): Payer: Self-pay | Admitting: Internal Medicine

## 2017-03-28 VITALS — BP 112/70 | HR 113 | Ht 66.5 in | Wt 321.0 lb

## 2017-03-28 DIAGNOSIS — I480 Paroxysmal atrial fibrillation: Secondary | ICD-10-CM | POA: Insufficient documentation

## 2017-03-28 DIAGNOSIS — Z6841 Body Mass Index (BMI) 40.0 and over, adult: Secondary | ICD-10-CM

## 2017-03-28 DIAGNOSIS — Z7901 Long term (current) use of anticoagulants: Secondary | ICD-10-CM

## 2017-03-28 DIAGNOSIS — I10 Essential (primary) hypertension: Secondary | ICD-10-CM | POA: Insufficient documentation

## 2017-03-28 LAB — PR PROTHROMBIN TIME, ONSITE: prothrombin INR: 2.6

## 2017-03-28 NOTE — Progress Notes (Signed)
DATE:  03/28/2017    NAME: Caitlin Wiley  DOB:  [birth year]    Referring Provider:  No ref. provider found  Primary Care Provider:  Hagedorn, Ashok Norris, MD    Caitlin Wiley is an 67 year old female who comes in for follow-up of paroxysmal atrial fibrillation.  The patient underwent pulmonary vein isolation in October.  She states that she is very aware of when she goes and atrial fibrillation and has had no recurrent symptoms.  The patient is had difficulty controlling her weight and since her weight is increased, she's had more joint pain.  She denies pain, pressure or heaviness in her chest.  She has mild baseline dyspnea on exertion which is stable and unchanged.  She denies paroxysmal nocturnal dyspnea or orthopnea.  She has had no recent palpitations.  She is occasionally lightheaded if she stands too quickly.  She is tolerating her current medical regimen well.        PROBLEM LIST:    Patient Active Problem List    Diagnosis Date Noted    Chronic anticoagulation 08/02/2015    Right cervical radiculopathy 05/30/2015    Obesity 05/30/2015    Paroxysmal atrial fibrillation ([place]) 04/17/2014     A. S/p ablation ([facility]) 10/19/2016      GERD (gastroesophageal reflux disease) 11/08/2012    Palpitations 08/21/2012    OA (osteoarthritis) 07/28/2012    Asthma 07/24/2012    Hypercholesterolemia 07/24/2012    Hx of colonic polyps--Dr Read--03/21/10  cleared for 5 years 07/24/2012     Colonoscopy 01/2005      Edema 07/24/2012    Sleep apnea 07/24/2012    HTN (hypertension) 07/24/2012    Menopausal state 07/24/2012    Rash 07/24/2012    Pulmonary nodule 07/24/2012    Grief reaction 07/24/2012     05/11/2011      Strain of thoracic spine 07/24/2012         The patient has a past medical history of Anxiety, Cervical disc disease, GERD (gastroesophageal reflux disease), Leukocytosis, Muscle cramps (04/2011), Osteoarthritis, and Pain of toe of right foot (02/2010).  Past Surgical History:   Procedure  Laterality Date    COLONOSCOPY FLX DX W/COLLJ SPEC WHEN PFRMD  11/2005    SURGICAL HX OTHER  08/1999    knee surgery       MEDICATION ALLERGIES AND INTOLERANCES:  Bee venom; Cats [animals]; Ciprofloxacin; Dust mite extract; Pneumococcal vaccines; Pollen extract; and Sulfa antibiotics    MEDICATIONS:    Current Outpatient Medications   Medication Sig Dispense Refill    Acetaminophen (TYLENOL) 325 MG Oral Tab Take 325 mg by mouth every 6 hours as needed.      Albuterol Sulfate HFA (VENTOLIN HFA) 108 (90 Base) MCG/ACT Inhalation Aero Soln Inhale 1-2 puffs by mouth every 6 hours as needed for shortness of breath/wheezing. 18 g 3    Flaxseed, Linseed, (FLAXSEED OIL) 1200 MG Oral Cap None Entered      Fluticasone Propionate 50 MCG/ACT Nasal Suspension Spray 2 sprays into each nostril daily. 1 Inhaler 3    Fluticasone Propionate HFA (FLOVENT HFA) 110 MCG/ACT Inhalation Aerosol Inhale 2 puffs by mouth 2 times a day. - RINSE MOUTH AFTER USE 1 Inhaler 5    GLUCOSAMINE CHONDROITIN COMPLX OR       Magnesium 400 MG Oral Cap Take 400 mg by mouth.      Montelukast Sodium 10 MG Oral Tab TAKE ONE TABLET BY MOUTH DAILY 90 tablet 1  Multiple Vitamins-Minerals (MULTIVITAMIN OR) no iron      Probiotic Product (PROBIOTIC DAILY OR)       RaNITidine HCl 150 MG Oral Tab Take 1 tablet (150 mg) by mouth 2 times a day. 180 tablet 3    Triamterene-HCTZ 37.5-25 MG Oral Tab TAKE ONE TABLET BY MOUTH ONCE DAILY 90 tablet 2    Warfarin Sodium (COUMADIN) 5 MG Oral Tab Take 7.5mg  X 1 day a week (Tu) and 5mg  X 6 days a week or as instructed by your physician. (Patient taking differently: Take 2.5mg  X 1 day a week (Tu) and 5mg  X 6 days a week or as instructed by your physician.) 110 tablet 2     No current facility-administered medications for this visit.      SOCIAL HISTORY:    Social History     Socioeconomic History    Marital status: Widowed     Spouse name: Not on file    Number of children: 0    Years of education: Not on file     Highest education level: Not on file   Social Needs    Financial resource strain: Not on file    Food insecurity - worry: Not on file    Food insecurity - inability: Not on file    Transportation needs - medical: Not on file    Transportation needs - non-medical: Not on file   Occupational History    Not on file   Tobacco Use    Smoking status: Never Smoker    Smokeless tobacco: Never Used   Substance and Sexual Activity    Alcohol use: No     Alcohol/week: 0.0 oz     Comment: rare     Drug use: No    Sexual activity: Never   Other Topics Concern    Not on file   Social History Narrative    Widow  Technical sales engineer  Rare ETOH  Former Smoker No social drug use. Rare exercise.         DXA completed on 07/31/12, resulting in normal bone density of the lumbar spine, and normal bone density at the L hip; ordered 04/30/15        Mammogram performed on 05/27/14, resulting in no mammographic features of malignancy         Colonoscopy performed on 04/03/14 with Dr. Estill Bamberg, resulting in moderate diverticulosis of the left side of the colon, long redundant colon, cleared for seven years         TDAP- 03/13/10    Zostavax-advised 04/30/15        PAP-monitored by Dr. Harmon Pier        HIV screening, Hepatitis C screening-done in 2014       FAMILY HISTORY:    Family History     Problem (# of Occurrences) Relation (Name,Age of Onset)    Anesthesia Problems (1) Mother    Arthritis (3) Mother, Father, Other: grandmother    Asthma (1) Other: and/or allergies - grandfather    Breast Cancer (2) Mother: & tongue cancer, Other: grandmother    Cancer (2) Father, Other: grandparents    Colon Polyps (1) Father    Diabetes (1) Father    Heart Disease (3) Father: torn valve 01/1999, Maternal Grandfather, Paternal Grandfather    Hypertension (2) Mother, Father    Lupus (1) Aunt/Uncle: great-aunt    Mental Illness (2) Mother, Brother    Stroke (2) Mother: possible, Father    Tuberculosis (1) Other: great-grandmother  REVIEW OF  SYSTEMS:  Constitutional: No fevers, weight loss.  Eyes: No visual loss.  ENT: No hearing loss, exudate.  Cardiovascular: See history of present illness.  Gastrointestinal: No melena, hematochezia or hematemesis.  Skin: No lesions.  Genitourinary: No dysuria, hematuria.  Musculoskeletal: No myalgias or arthralgias.  Neurological: No TIA or CVA symptoms.  Allergy/immunologic: No hives or rash.  Psychiatric: No suicidal ideation.      PHYSICAL EXAMINATION reveals a well-appearing female, who is oriented and expresses a normal affect.  Vitals:  BP 112/70    Pulse (!) 113    Ht 5' 6.5" (1.689 m)    Wt (!) 321 lb (145.6 kg)    SpO2 96%    BMI 51.04 kg/m .  Conjunctivae are clear.  Perioral mucosa is without cyanosis.  Jugular veins are flat.  Respirations are non-labored and lungs are clear to auscultation.  Cardiac exam shows no lifts or thrills.  Rhythm is regular, with no murmurs, rubs or gallops.  Carotid upstrokes are normal without bruits.  Abdomen is without palpable hepatomegaly or tenderness.  The abdominal aorta is without bruits or palpable enlargement.  Gait is labored.  Upper extremity digits show no cyanosis.  There is no lower extremity edema.  Pedal pulses are 2+ bilaterally.  Skin is warm and dry.    IMPRESSION AND PLAN:  1.  Paroxysmal atrial fibrillation: The patient is stable status post pulmonary vein isolation.  She is currently asymptomatic and doing well.  She remains anticoagulated.  2.  Essential hypertension: The patient's blood pressure is adequately controlled.    I will see the patient back in follow-up in 6 months.  If she remains in sinus rhythm, we will discuss stopping her anticoagulation.      Electronically signed by Marquis Buggy, MD, [facility]  Please note:  This document was created using speech recognition technology and may contain wrong word or "sound-alike" substitutions.    cc: [place]  [address], [place] [address]  Patient Care Team:  Hagedorn,  Ashok Norris, MD as PCP - General (Internal Medicine)  Ivan Croft Katherine Basset, MD (Cardiology)  Ronalee Red, RD as Dietitian (Dietitian/Nutritionist)  Dolack, Marcine Matar, MD as Consulting Physician (Internal Medicine)

## 2017-03-28 NOTE — Progress Notes (Signed)
Anticoag Treatment Plan  As of 03/28/2017    INR goal:   2.0-3.0   TTR:   84.5 % (3.6 y)   INR used for dosing:   2.6 (03/28/2017)   Full warfarin instructions:   2.5 mg every Mon; 5 mg all other days   Weekly warfarin total:   32.5 mg   Next INR check:   04/28/2017   Target end date:       Indications    Paroxysmal atrial fibrillation ([place]) [I48.0]             Anticoagulation Episode Summary     INR check location:       Send INR reminders to:   [facility] EDM CLINICAL SUPPORT STAFF POOL    Comments:               SUBJECTIVE    Patient Findings     Negatives:   Signs/symptoms of thrombosis, Signs/symptoms of bleeding, Laboratory test error suspected, Change in health, Change in alcohol use, Change in activity, Upcoming invasive procedure, Emergency department visit, Upcoming dental procedure, Missed doses, Extra doses, Change in medications, Change in diet/appetite, Hospital admission, Bruising, Other complaints          OBJECTIVE    Anticoag Visit Date 03/28/2017 03/17/2017 03/10/2017 02/24/2017 02/08/2017 01/24/2017 01/13/2017   INR Goal 2.0-3.0 2.0-3.0 2.0-3.0 2.0-3.0 2.0-3.0 2.0-3.0 2.0-3.0   Selected INR 2.6 2.8 4.0 2.9 3.2 2.4 3.4   INR Date 03/28/2017 03/17/2017 03/10/2017 02/24/2017 02/08/2017 01/24/2017 01/13/2017   Previous Week Total 32.5 mg 32.5 mg 35 mg 35 mg 35 mg 35 mg 35 mg   Pt Deviation No No No No No No No   Pt Findings Comments - - Has been taking Tylenol 650 mg twice daily for arthritis for the last two months - - - -   Next Week Total 32.5 mg 32.5 mg 32.5 mg 35 mg 35 mg 35 mg 35 mg   Full Instructions 2.5 mg every Mon; 5 mg all other days 2.5 mg every Mon; 5 mg all other days 2.5 mg every Mon; 5 mg all other days 5 mg every day 5 mg every day 5 mg every day 5 mg every day   Date of Next INR 04/28/2017 03/31/2017 - 03/10/2017 02/23/2017 02/09/2017 01/24/2017       ASSESSMENT    Therapeutic INR in stable patient and without complications.     PLAN    1.  Continue Warfarin at current dose of 2.5 mg on Mondays and 5 mg other days  of the week. Doing well  .  As of 03/28/2017    Warfarin maintenance plan:   2.5 mg every Mon; 5 mg all other days   Full warfarin instructions:   2.5 mg every Mon; 5 mg all other days   Weekly warfarin total:   32.5 mg   No change documented:   Shelba Flake, RN   Next INR check:   04/28/2017         Anticoagulation Episode Summary     Comments:             2. Avanni Turnbaugh acknowledged understanding of this plan      PATIENT INSTRUCTIONS    March 2019 Details    Sun Glori Luis Thu Fri Sat          1               2  3               4               5               6               7               8               9                 10               11       2.5 mg   See details      12      5 mg         13      5 mg         14      5 mg         15      5 mg         16      5 mg           17      5 mg         18      2.5 mg         19      5 mg         20      5 mg         21      5 mg         22      5 mg         23      5 mg           24      5 mg         25      2.5 mg         26      5 mg         27      5 mg         28      5 mg         29      5 mg         30      5 mg           31      5 mg                Date Details   03/11 This INR check                 April 2019 Details    Sun Mon Tue Wed Thu Fri Sat      1      2.5 mg         2      5 mg         3      5 mg         4      5 mg         5      5 mg         6  5 mg           7      5 mg         8      2.5 mg         9      5 mg         10      5 mg         11      5 mg         12               13                 14               15               16               17               18               19               20                 21               22               23               24               25               26               27                 28               29               30                     Date Details   No additional details    Date of next INR:  04/28/2017

## 2017-03-28 NOTE — Patient Instructions (Signed)
Return in 6 months

## 2017-03-29 NOTE — Progress Notes (Signed)
Pt called on 3/12 - AVS said EKG was done. No EKG was done - order cancelled.  Sallye Ober,  RN  [place]

## 2017-04-18 ENCOUNTER — Ambulatory Visit: Payer: Medicare HMO | Attending: Internal Medicine | Admitting: Unknown Physician Specialty

## 2017-04-18 DIAGNOSIS — I480 Paroxysmal atrial fibrillation: Secondary | ICD-10-CM | POA: Insufficient documentation

## 2017-04-18 LAB — PR PROTHROMBIN TIME, ONSITE: prothrombin INR: 2

## 2017-04-18 NOTE — Progress Notes (Signed)
Anticoag Treatment Plan  As of 04/18/2017    INR goal:   2.0-3.0   TTR:   84.7 % (3.6 y)   INR used for dosing:   2.0 (04/18/2017)   Full warfarin instructions:   5 mg every day   Weekly warfarin total:   35 mg   Next INR check:   04/25/2017   Target end date:       Indications    Paroxysmal atrial fibrillation ([place]) [I48.0]             Anticoagulation Episode Summary     INR check location:       Send INR reminders to:   [facility] EDM CLINICAL SUPPORT STAFF POOL    Comments:               SUBJECTIVE    Patient Findings     Positives:   Change in diet/appetite    Negatives:   Signs/symptoms of thrombosis, Signs/symptoms of bleeding, Laboratory test error suspected, Change in health, Change in alcohol use, Change in activity, Upcoming invasive procedure, Emergency department visit, Upcoming dental procedure, Missed doses, Extra doses, Change in medications, Hospital admission, Bruising, Other complaints    Comments:   Has been following "NOOM" program for last 3 weeks, eating more vegetables and increased activity, has lost about 7 lbs          OBJECTIVE    Anticoag Visit Date 04/18/2017 03/28/2017 03/17/2017 03/10/2017 02/24/2017 02/08/2017 01/24/2017   INR Goal 2.0-3.0 2.0-3.0 2.0-3.0 2.0-3.0 2.0-3.0 2.0-3.0 2.0-3.0   Selected INR 2.0 2.6 2.8 4.0 2.9 3.2 2.4   INR Date 04/18/2017 03/28/2017 03/17/2017 03/10/2017 02/24/2017 02/08/2017 01/24/2017   Previous Week Total 32.5 mg 32.5 mg 32.5 mg 35 mg 35 mg 35 mg 35 mg   Pt Deviation No No No No No No No   Pt Findings Comments Has been following "NOOM" program for last 3 weeks, eating more vegetables and increased activity, has lost about 7 lbs - - Has been taking Tylenol 650 mg twice daily for arthritis for the last two months - - -   Next Week Total 35 mg 32.5 mg 32.5 mg 32.5 mg 35 mg 35 mg 35 mg   Full Instructions 5 mg every day 2.5 mg every Mon; 5 mg all other days 2.5 mg every Mon; 5 mg all other days 2.5 mg every Mon; 5 mg all other days 5 mg every day 5 mg every day 5 mg every day   Date of  Next INR 04/25/2017 04/28/2017 03/31/2017 - 03/10/2017 02/23/2017 02/09/2017       ASSESSMENT    Therapeutic INR in stable patient and without complications.  Has been following "NOOM" program for last 3 weeks, eating more vegetables and increased activity, has lost about 7 lbs.     PLAN    1.  Will increase Warfarin to 5 mg daily and re check INR in one week.   Reviewed per MD protocol.  Routing to Dr. Dorothyann Peng for review and signature.          .  As of 04/18/2017    Warfarin maintenance plan:   5 mg every day   Full warfarin instructions:   5 mg every day   Weekly warfarin total:   35 mg   Next INR check:   04/25/2017         Anticoagulation Episode Summary     Comments:  2. Kalista Laguardia acknowledged understanding of this plan      PATIENT INSTRUCTIONS    April 2019 Details    Sun Glori Luis Thu Fri Sat      1      5 mg   See details      2      5 mg         3      5 mg         4      5 mg         5      5 mg         6      5 mg           7      5 mg         8      5 mg         9               10               11               12               13                 14               15               16               17               18               19               20                 21               22               23               24               25               26               27                 28               29               30                     Date Details   04/01 This INR check       Date of next INR:  04/25/2017

## 2017-04-25 NOTE — Progress Notes (Signed)
Agree 

## 2017-04-27 ENCOUNTER — Encounter (HOSPITAL_BASED_OUTPATIENT_CLINIC_OR_DEPARTMENT_OTHER): Payer: Medicare HMO

## 2017-04-27 ENCOUNTER — Ambulatory Visit: Payer: Medicare HMO | Attending: Internal Medicine

## 2017-04-27 DIAGNOSIS — I480 Paroxysmal atrial fibrillation: Secondary | ICD-10-CM | POA: Insufficient documentation

## 2017-04-27 LAB — PR PROTHROMBIN TIME, ONSITE: prothrombin INR: 2.2

## 2017-04-27 NOTE — Progress Notes (Signed)
Anticoag Treatment Plan  As of 04/27/2017    INR goal:   2.0-3.0   TTR:   84.8 % (3.7 y)   INR used for dosing:   2.2 (04/27/2017)   Full warfarin instructions:   5 mg every day   Weekly warfarin total:   35 mg   Next INR check:   05/18/2017   Target end date:       Indications    Paroxysmal atrial fibrillation ([place]) [I48.0]             Anticoagulation Episode Summary     INR check location:       Send INR reminders to:   [facility] EDM CLINICAL SUPPORT STAFF POOL    Comments:               SUBJECTIVE    Patient Findings     Negatives:   Signs/symptoms of thrombosis, Signs/symptoms of bleeding, Laboratory test error suspected, Change in health, Change in alcohol use, Change in activity, Upcoming invasive procedure, Emergency department visit, Upcoming dental procedure, Missed doses, Extra doses, Change in medications, Change in diet/appetite, Hospital admission, Bruising, Other complaints          OBJECTIVE    Anticoag Visit Date 04/27/2017 04/18/2017 03/28/2017 03/17/2017 03/10/2017 02/24/2017 02/08/2017   INR Goal 2.0-3.0 2.0-3.0 2.0-3.0 2.0-3.0 2.0-3.0 2.0-3.0 2.0-3.0   Selected INR 2.2 2.0 2.6 2.8 4.0 2.9 3.2   INR Date 04/27/2017 04/18/2017 03/28/2017 03/17/2017 03/10/2017 02/24/2017 02/08/2017   Previous Week Total 35 mg 32.5 mg 32.5 mg 32.5 mg 35 mg 35 mg 35 mg   Pt Deviation No No No No No No No   Pt Findings Comments - Has been following "NOOM" program for last 3 weeks, eating more vegetables and increased activity, has lost about 7 lbs - - Has been taking Tylenol 650 mg twice daily for arthritis for the last two months - -   Next Week Total 35 mg 35 mg 32.5 mg 32.5 mg 32.5 mg 35 mg 35 mg   Full Instructions 5 mg every day 5 mg every day 2.5 mg every Mon; 5 mg all other days 2.5 mg every Mon; 5 mg all other days 2.5 mg every Mon; 5 mg all other days 5 mg every day 5 mg every day   Date of Next INR 05/18/2017 04/25/2017 04/28/2017 03/31/2017 - 03/10/2017 02/23/2017       ASSESSMENT    Therapeutic INR in stable patient and without complications.      PLAN    1.  Continue Warfarin recheck in 3 weeks.  .  As of 04/27/2017    Warfarin maintenance plan:   5 mg every day   Full warfarin instructions:   5 mg every day   Weekly warfarin total:   35 mg   No change documented:   Olean Ree, RN   Next INR check:   05/18/2017         Anticoagulation Episode Summary     Comments:             2. Saanvika Vazques acknowledged understanding of this plan      PATIENT INSTRUCTIONS    April 2019 Details    Sun Glori Luis Thu Fri Sat      1               2               3  4               5               6                 7               8               9               10      5  mg   See details      11      5 mg         12      5 mg         13      5 mg           14      5 mg         15      5 mg         16      5 mg         17      5 mg         18      5 mg         19      5 mg         20      5 mg           21      5 mg         22      5 mg         23      5 mg         24      5 mg         25      5 mg         26      5 mg         27      5 mg           28      5 mg         29      5 mg         30      5 mg              Date Details   04/10 This INR check                 May 2019 Details    Sun Mon Tue Wed Thu Fri Sat        1      5 mg         2               3               4                 5               6               7                8  9               10               11                 12               13               14               15               16               17               18                 19               20               21               22               23               24               25                 26               27               28               29               30               31                  Date Details   No additional details    Date of next INR:  05/18/2017

## 2017-05-05 ENCOUNTER — Telehealth (HOSPITAL_BASED_OUTPATIENT_CLINIC_OR_DEPARTMENT_OTHER): Payer: Self-pay | Admitting: Internal Medicine

## 2017-05-05 NOTE — Telephone Encounter (Signed)
Pt called stating she was instructed to be on liquid diet temporary but was concerned if this would interfere with her coumadin.     Routing to [place] ADVICE/REQUEST.    Thank you     Fransico Michael  [facility] MEDICINE  [facility]

## 2017-05-06 ENCOUNTER — Ambulatory Visit: Payer: Medicare HMO | Attending: Internal Medicine | Admitting: Unknown Physician Specialty

## 2017-05-06 DIAGNOSIS — I480 Paroxysmal atrial fibrillation: Secondary | ICD-10-CM

## 2017-05-06 LAB — PR PROTHROMBIN TIME, ONSITE: prothrombin INR: 2

## 2017-05-06 NOTE — Telephone Encounter (Signed)
I called the patient and she states she had a diverticulitis flare up and did not eat anything on Tuesday, was on a liquid diet on Wednesday, and started a bland diet yesterday.      She will come in today for INR check.     Jo Daviess, RN

## 2017-05-06 NOTE — Progress Notes (Signed)
Anticoag Treatment Plan  As of 05/06/2017    INR goal:   2.0-3.0   TTR:   84.9 % (3.7 y)   INR used for dosing:   2.0 (05/06/2017)   Full warfarin instructions:   5 mg every day   Weekly warfarin total:   35 mg   Next INR check:   05/18/2017   Target end date:       Indications    Paroxysmal atrial fibrillation ([place]) [I48.0]             Anticoagulation Episode Summary     INR check location:       Send INR reminders to:   [facility] EDM CLINICAL SUPPORT STAFF POOL    Comments:               SUBJECTIVE    Patient Findings     Positives:   Missed doses, Change in diet/appetite, Other complaints    Negatives:   Signs/symptoms of thrombosis, Signs/symptoms of bleeding, Laboratory test error suspected, Change in health, Change in alcohol use, Change in activity, Upcoming invasive procedure, Emergency department visit, Upcoming dental procedure, Extra doses, Change in medications, Hospital admission, Bruising    Comments:   Diverticulitis flare up, diet changes, took 1/2 dose of Warfarin on Wednesday          OBJECTIVE    Anticoag Visit Date 05/06/2017 04/27/2017 04/18/2017 03/28/2017 03/17/2017 03/10/2017 02/24/2017   INR Goal 2.0-3.0 2.0-3.0 2.0-3.0 2.0-3.0 2.0-3.0 2.0-3.0 2.0-3.0   Selected INR 2.0 2.2 2.0 2.6 2.8 4.0 2.9   INR Date 05/06/2017 04/27/2017 04/18/2017 03/28/2017 03/17/2017 03/10/2017 02/24/2017   Previous Week Total 35 mg 35 mg 32.5 mg 32.5 mg 32.5 mg 35 mg 35 mg   Pt Deviation No No No No No No No   Pt Findings Comments Diverticulitis flare up, diet changes, took 1/2 dose of Warfarin on Wednesday - Has been following "NOOM" program for last 3 weeks, eating more vegetables and increased activity, has lost about 7 lbs - - Has been taking Tylenol 650 mg twice daily for arthritis for the last two months -   Next Week Total 35 mg 35 mg 35 mg 32.5 mg 32.5 mg 32.5 mg 35 mg   Full Instructions 5 mg every day 5 mg every day 5 mg every day 2.5 mg every Mon; 5 mg all other days 2.5 mg every Mon; 5 mg all other days 2.5 mg every Mon; 5 mg all  other days 5 mg every day   Date of Next INR 05/18/2017 05/18/2017 04/25/2017 04/28/2017 03/31/2017 - 03/10/2017       ASSESSMENT    Therapeutic INR in stable patient and without complications.  Diverticulitis flare up.  Did not eat anything on Tuesday, liquid diet on Wednesday, and bland diet since Thursday.  Took 1/2 dose of Warfarin on Wednesday.      PLAN    1.  Continue Warfarin 5 mg daily and re check INR in 10 days.  Reviewed per MD protocol.        .  As of 05/06/2017    Warfarin maintenance plan:   5 mg every day   Full warfarin instructions:   5 mg every day   Weekly warfarin total:   35 mg   Next INR check:   05/18/2017         Anticoagulation Episode Summary     Comments:             2. Caitlin Harvest  Wiley acknowledged understanding of this plan      PATIENT INSTRUCTIONS    April 2019 Details    Sun Glori Luis Thu Fri Sat      1               2               3               4               5               6                 7               8               9               10               11               12               13                 14               15               16               17               18               19      5  mg   See details      20      5 mg           21      5 mg         22      5 mg         23      5 mg         24      5 mg         25      5 mg         26      5 mg         27      5 mg           28      5 mg         29      5 mg         30      5 mg              Date Details   04/19 This INR check                 May 2019 Details    Sun Mon Tue Wed Thu Fri Sat        1      5 mg         2               3  4                 5               6               7               8               9               10               11                 12               13               14               15               16               17               18                 19               20               21               22               23               24               25                  26               27               28               29               30               31                  Date Details   No additional details    Date of next INR:  05/18/2017

## 2017-05-18 ENCOUNTER — Ambulatory Visit: Payer: Medicare HMO | Attending: Internal Medicine

## 2017-05-18 DIAGNOSIS — I480 Paroxysmal atrial fibrillation: Secondary | ICD-10-CM | POA: Insufficient documentation

## 2017-05-18 LAB — PR PROTHROMBIN TIME, ONSITE: prothrombin INR: 2.3

## 2017-05-18 NOTE — Progress Notes (Signed)
Anticoag Treatment Plan  As of 05/18/2017    INR goal:   2.0-3.0   TTR:   85.0 % (3.7 y)   INR used for dosing:   2.3 (05/18/2017)   Full warfarin instructions:   5 mg every day   Weekly warfarin total:   35 mg   Next INR check:   06/08/2017   Target end date:       Indications    Paroxysmal atrial fibrillation ([place]) [I48.0]             Anticoagulation Episode Summary     INR check location:       Send INR reminders to:   [facility] EDM CLINICAL SUPPORT STAFF POOL    Comments:               SUBJECTIVE    Patient Findings     Positives:   Bruising    Comments:   One fading bruise on left big toe.          OBJECTIVE    Anticoag Visit Date 05/18/2017 05/06/2017 04/27/2017 04/18/2017 03/28/2017 03/17/2017 03/10/2017   INR Goal 2.0-3.0 2.0-3.0 2.0-3.0 2.0-3.0 2.0-3.0 2.0-3.0 2.0-3.0   Selected INR 2.3 2.0 2.2 2.0 2.6 2.8 4.0   INR Date 05/18/2017 05/06/2017 04/27/2017 04/18/2017 03/28/2017 03/17/2017 03/10/2017   Previous Week Total 35 mg 35 mg 35 mg 32.5 mg 32.5 mg 32.5 mg 35 mg   Pt Deviation No No No No No No No   Pt Findings Comments One fading bruise on left big toe. Diverticulitis flare up, diet changes, took 1/2 dose of Warfarin on Wednesday - Has been following "NOOM" program for last 3 weeks, eating more vegetables and increased activity, has lost about 7 lbs - - Has been taking Tylenol 650 mg twice daily for arthritis for the last two months   Next Week Total 35 mg 35 mg 35 mg 35 mg 32.5 mg 32.5 mg 32.5 mg   Full Instructions 5 mg every day 5 mg every day 5 mg every day 5 mg every day 2.5 mg every Mon; 5 mg all other days 2.5 mg every Mon; 5 mg all other days 2.5 mg every Mon; 5 mg all other days   Date of Next INR 06/08/2017 05/18/2017 05/18/2017 04/25/2017 04/28/2017 03/31/2017 -       ASSESSMENT    Therapeutic INR in stable patient and without complications.     PLAN    1.  Continue Warfarin and recheck in 3 weeks.  .  As of 05/18/2017    Warfarin maintenance plan:   5 mg every day   Full warfarin instructions:   5 mg every day   Weekly warfarin  total:   35 mg   No change documented:   Olean Ree, RN   Next INR check:   06/08/2017         Anticoagulation Episode Summary     Comments:             2. Davene Jobin acknowledged understanding of this plan      PATIENT INSTRUCTIONS    May 2019 Details    Sun Mon Tue Wed Thu Fri Sat        1      5 mg   See details      2      5 mg         3      5 mg  4      5 mg           5      5 mg         6      5 mg         7      5 mg         8      5 mg         9      5 mg         10      5 mg         11      5 mg           12      5 mg         13      5 mg         14      5 mg         15      5 mg         16      5 mg         17      5 mg         18      5 mg           19      5 mg         20      5 mg         21      5 mg         22      5 mg         23               24               25                 26               27               28               29               30               31                  Date Details   05/01 This INR check       Date of next INR:  06/08/2017

## 2017-05-20 ENCOUNTER — Other Ambulatory Visit (HOSPITAL_BASED_OUTPATIENT_CLINIC_OR_DEPARTMENT_OTHER): Payer: Self-pay | Admitting: Internal Medicine

## 2017-05-20 DIAGNOSIS — I480 Paroxysmal atrial fibrillation: Secondary | ICD-10-CM

## 2017-05-20 NOTE — Telephone Encounter (Signed)
Refill request received from [place] for Warfarin. Routing to Dr Dorothyann Peng for approval and signature.    Sallye Ober RN

## 2017-05-23 ENCOUNTER — Encounter (INDEPENDENT_AMBULATORY_CARE_PROVIDER_SITE_OTHER): Payer: Self-pay

## 2017-05-23 DIAGNOSIS — G4733 Obstructive sleep apnea (adult) (pediatric): Secondary | ICD-10-CM

## 2017-05-23 MED ORDER — WARFARIN SODIUM 5 MG OR TABS
5.0000 mg | ORAL_TABLET | Freq: Every day | ORAL | 3 refills | Status: DC
Start: 2017-05-23 — End: 2018-05-15

## 2017-05-23 NOTE — Addendum Note (Signed)
Addended by: Arrie Senate, Cleotis Nipper on: 05/23/2017 03:30 PM     Modules accepted: Orders

## 2017-05-23 NOTE — Telephone Encounter (Signed)
Referral pend

## 2017-05-23 NOTE — Telephone Encounter (Signed)
Reyna--please load a referral for sleep medicine    Latina Craver, MD  Location: [facility] Dept of Pulmonary Sleep Disorders  [facility], [place] [address]  Phone: [phone]   FAX: [phone]

## 2017-05-23 NOTE — Addendum Note (Signed)
Addended by: Corey Harold on: 05/23/2017 03:37 PM     Modules accepted: Orders

## 2017-06-08 ENCOUNTER — Ambulatory Visit: Payer: Medicare HMO | Attending: Internal Medicine | Admitting: Unknown Physician Specialty

## 2017-06-08 DIAGNOSIS — I480 Paroxysmal atrial fibrillation: Secondary | ICD-10-CM

## 2017-06-08 LAB — PR PROTHROMBIN TIME, ONSITE: prothrombin INR: 1.8

## 2017-06-08 NOTE — Progress Notes (Signed)
agree

## 2017-06-08 NOTE — Progress Notes (Signed)
Anticoag Treatment Plan  As of 06/08/2017    INR goal:   2.0-3.0   TTR:   84.7 % (3.8 y)   INR used for dosing:   1.8! (06/08/2017)   Full warfarin instructions:   7.5 mg every Wed; 5 mg all other days   Weekly warfarin total:   37.5 mg   Next INR check:   06/22/2017   Target end date:       Indications    Paroxysmal atrial fibrillation ([place]) [I48.0]             Anticoagulation Episode Summary     INR check location:       Send INR reminders to:   [facility] EDM CLINICAL SUPPORT STAFF POOL    Comments:               SUBJECTIVE    Patient Findings     Positives:   Change in diet/appetite    Negatives:   Signs/symptoms of thrombosis, Signs/symptoms of bleeding, Laboratory test error suspected, Change in health, Change in alcohol use, Change in activity, Upcoming invasive procedure, Emergency department visit, Upcoming dental procedure, Missed doses, Extra doses, Change in medications, Hospital admission, Bruising, Other complaints    Comments:   Eating more greens.           OBJECTIVE    Anticoag Visit Date 06/08/2017 05/18/2017 05/06/2017 04/27/2017 04/18/2017 03/28/2017 03/17/2017   INR Goal 2.0-3.0 2.0-3.0 2.0-3.0 2.0-3.0 2.0-3.0 2.0-3.0 2.0-3.0   Selected INR 1.8 2.3 2.0 2.2 2.0 2.6 2.8   INR Date 06/08/2017 05/18/2017 05/06/2017 04/27/2017 04/18/2017 03/28/2017 03/17/2017   Previous Week Total 35 mg 35 mg 35 mg 35 mg 32.5 mg 32.5 mg 32.5 mg   Pt Deviation No No No No No No No   Pt Findings Comments Eating more greens.  One fading bruise on left big toe. Diverticulitis flare up, diet changes, took 1/2 dose of Warfarin on Wednesday - Has been following "NOOM" program for last 3 weeks, eating more vegetables and increased activity, has lost about 7 lbs - -   Next Week Total 37.5 mg 35 mg 35 mg 35 mg 35 mg 32.5 mg 32.5 mg   Full Instructions 7.5 mg every Wed; 5 mg all other days 5 mg every day 5 mg every day 5 mg every day 5 mg every day 2.5 mg every Mon; 5 mg all other days 2.5 mg every Mon; 5 mg all other days   Date of Next INR 06/22/2017  06/08/2017 05/18/2017 05/18/2017 04/25/2017 04/28/2017 03/31/2017       ASSESSMENT    Subtherapeutic INR in association with eating more greens and without complications.     PLAN    1.  Increase Warfarin to 7.5 mg x 1 day a week (W) and 5 mg x 6 days a week.  Re check INR in 2 weeks.  Reviewed per MD protocol.  Routing to Dr. Dorothyann Peng for review and signature.          .  As of 06/08/2017    Warfarin maintenance plan:   7.5 mg every Wed; 5 mg all other days   Full warfarin instructions:   7.5 mg every Wed; 5 mg all other days   Weekly warfarin total:   37.5 mg   Next INR check:   06/22/2017         Anticoagulation Episode Summary     Comments:             2. Nunzio Cory  Theo Reither acknowledged understanding of this plan      PATIENT INSTRUCTIONS    May 2019 Details    Sun Dorna Leitz Fri Sat        1               2               3               4                 5               6               7               8               9               10               11                 12               13               14               15               16               17               18                 19               20               21               22       7.5 mg   See details      23      5 mg         24      5 mg         25      5 mg           26      5 mg         27      5 mg         28      5 mg         29      7.5 mg         30      5 mg         31      5 mg           Date Details   05/22 This INR check                 June 2019 Details    Sun Mon Tue Wed Thu Fri Sat           1      5 mg           2      5 mg  3      5 mg         4      5 mg         5      7.5 mg         6               7               8                 9               10               11               12               13               14               15                 16               17               18               19               20               21               22                 23               24               25               26                27               28               29                 30                       Date Details   No additional details    Date of next INR:  06/22/2017

## 2017-06-22 ENCOUNTER — Ambulatory Visit: Payer: Medicare HMO | Attending: Internal Medicine | Admitting: Unknown Physician Specialty

## 2017-06-22 DIAGNOSIS — I480 Paroxysmal atrial fibrillation: Secondary | ICD-10-CM | POA: Insufficient documentation

## 2017-06-22 LAB — PR PROTHROMBIN TIME, ONSITE: prothrombin INR: 1.9

## 2017-06-22 NOTE — Progress Notes (Signed)
agree

## 2017-06-22 NOTE — Progress Notes (Signed)
Anticoag Treatment Plan  As of 06/22/2017    INR goal:   2.0-3.0   TTR:   83.8 % (3.8 y)   INR used for dosing:   1.9! (06/22/2017)   Full warfarin instructions:   7.5 mg every Wed, Fri; 5 mg all other days   Weekly warfarin total:   40 mg   Next INR check:   07/06/2017   Target end date:       Indications    Paroxysmal atrial fibrillation ([place]) [I48.0]             Anticoagulation Episode Summary     INR check location:       Send INR reminders to:   [facility] EDM CLINICAL SUPPORT STAFF POOL    Comments:               SUBJECTIVE    Patient Findings     Negatives:   Signs/symptoms of thrombosis, Signs/symptoms of bleeding, Laboratory test error suspected, Change in health, Change in alcohol use, Change in activity, Upcoming invasive procedure, Emergency department visit, Upcoming dental procedure, Missed doses, Extra doses, Change in medications, Change in diet/appetite, Hospital admission, Bruising, Other complaints          OBJECTIVE    Anticoag Visit Date 06/22/2017 06/08/2017 05/18/2017 05/06/2017 04/27/2017 04/18/2017 03/28/2017   INR Goal 2.0-3.0 2.0-3.0 2.0-3.0 2.0-3.0 2.0-3.0 2.0-3.0 2.0-3.0   Selected INR 1.9 1.8 2.3 2.0 2.2 2.0 2.6   INR Date 06/22/2017 06/08/2017 05/18/2017 05/06/2017 04/27/2017 04/18/2017 03/28/2017   Previous Week Total 37.5 mg 35 mg 35 mg 35 mg 35 mg 32.5 mg 32.5 mg   Pt Deviation No No No No No No No   Pt Findings Comments - Eating more greens.  One fading bruise on left big toe. Diverticulitis flare up, diet changes, took 1/2 dose of Warfarin on Wednesday - Has been following "NOOM" program for last 3 weeks, eating more vegetables and increased activity, has lost about 7 lbs -   Next Week Total 40 mg 37.5 mg 35 mg 35 mg 35 mg 35 mg 32.5 mg   Full Instructions 7.5 mg every Wed, Fri; 5 mg all other days 7.5 mg every Wed; 5 mg all other days 5 mg every day 5 mg every day 5 mg every day 5 mg every day 2.5 mg every Mon; 5 mg all other days   Date of Next INR 07/06/2017 06/22/2017 06/08/2017 05/18/2017 05/18/2017 04/25/2017  04/28/2017       ASSESSMENT    Subtherapeutic INR in association with eating more vegetables and without complications.  Still on Noom diet so eating more vegetables.    PLAN    1.  Increase Warfarin to 7.5 mg x 2 days a week (W/F) and 5 mg x 5 days a week.  Re check INR in 2 weeks.  Reviewed per MD protocol.          .  As of 06/22/2017    Warfarin maintenance plan:   7.5 mg every Wed, Fri; 5 mg all other days   Full warfarin instructions:   7.5 mg every Wed, Fri; 5 mg all other days   Weekly warfarin total:   40 mg   Next INR check:   07/06/2017         Anticoagulation Episode Summary     Comments:             2. [name] acknowledged understanding of this plan      PATIENT  INSTRUCTIONS    June 2019 Details    Sun Glori Luis Thu Fri Sat           1                 2               3               4               5       7.5 mg   See details      6      5 mg         7      7.5 mg         8      5 mg           9      5 mg         10      5 mg         11      5 mg         12      7.5 mg         13      5 mg         14      7.5 mg         15      5 mg           16      5 mg         17      5 mg         18      5 mg         19      7.5 mg         20               21               22                 23               24               25               26               27               28               29                 30                       Date Details   06/05 This INR check       Date of next INR:  07/06/2017

## 2017-06-25 ENCOUNTER — Telehealth (INDEPENDENT_AMBULATORY_CARE_PROVIDER_SITE_OTHER): Payer: Self-pay | Admitting: Family Medicine

## 2017-06-27 ENCOUNTER — Telehealth (INDEPENDENT_AMBULATORY_CARE_PROVIDER_SITE_OTHER): Payer: Self-pay | Admitting: Family Medicine

## 2017-06-27 LAB — EKG 12 LEAD
Atrial Rate: 61 {beats}/min
Diagnosis: NORMAL
P Axis: 17 degrees
P-R Interval: 208 ms
Q-T Interval: 448 ms
QRS Duration: 90 ms
QTC Calculation: 450 ms
R Axis: 74 degrees
T Axis: 15 degrees
Ventricular Rate: 61 {beats}/min

## 2017-06-27 NOTE — Telephone Encounter (Signed)
Yes.  Please offer patient a sooner appointment.  Not on a Saturday.

## 2017-06-27 NOTE — Telephone Encounter (Signed)
Patient is requesting a call back from clinic manager or supervisor about the appointment that was canceled  She feels that she should not have to wait till September to establish care with Dr. Velvet Bathe

## 2017-06-27 NOTE — Telephone Encounter (Signed)
See TE from 6/8.    Patient was scheduled for new patient appointment on 8/1 that was cancelled by the clinic due to provider schedule change.    Patient is requesting to be seen sooner than next available new patient appointment, which is in September.    Please advise if new patient limit can be overrode to accommodate patient's request.

## 2017-06-27 NOTE — Telephone Encounter (Signed)
(  TEXTING IS AN OPTION FOR [facility] ONLY)  Is this a [place] clinic? No      RETURN CALL: Detailed message on voicemail only      SUBJECT:  General Message     REASON FOR REQUEST: Returning a call     MESSAGE: Patient is returning a call in regards to the appointment cancellation, she feels she should not have to wait to see Dr. Velvet Bathe until September   CCR accidentally removed a TE from clinic stating the call was done to cancel the appointment

## 2017-06-28 NOTE — Telephone Encounter (Signed)
Scheduled. Closing.

## 2017-07-05 ENCOUNTER — Telehealth (HOSPITAL_BASED_OUTPATIENT_CLINIC_OR_DEPARTMENT_OTHER): Payer: Self-pay | Admitting: Internal Medicine

## 2017-07-05 DIAGNOSIS — I4891 Unspecified atrial fibrillation: Secondary | ICD-10-CM

## 2017-07-05 DIAGNOSIS — M5416 Radiculopathy, lumbar region: Secondary | ICD-10-CM

## 2017-07-05 NOTE — Telephone Encounter (Signed)
I called Caitlin Wiley back.    She reports that her current disabled parking permit will expire in July.  Pt's PCP is retired, and Caitlin Wiley will not see her new PCP Caitlin Wiley in [facility] until Aug.    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    Routing to Dr Dorothyann Peng for his consideration.    Pt coming in for INR on Wed 07/06/17    [place], [place] Team

## 2017-07-05 NOTE — Telephone Encounter (Signed)
Patient called.  She states that her handicap parking sticker will be expiring and she needs a letter stating that she needs an extension on it.  Please call patient and advise.      Molly Maduro, CMA

## 2017-07-06 ENCOUNTER — Ambulatory Visit: Payer: Medicare HMO | Attending: Internal Medicine

## 2017-07-06 DIAGNOSIS — I480 Paroxysmal atrial fibrillation: Secondary | ICD-10-CM | POA: Insufficient documentation

## 2017-07-06 LAB — PR PROTHROMBIN TIME, ONSITE: prothrombin INR: 2.1

## 2017-07-06 NOTE — Progress Notes (Signed)
Anticoag Treatment Plan  As of 07/06/2017    INR goal:   2.0-3.0   TTR:   83.5 % (3.8 y)   INR used for dosing:   2.1 (07/06/2017)   Full warfarin instructions:   7.5 mg every Mon, Thu; 5 mg all other days   Weekly warfarin total:   40 mg   Next INR check:   07/27/2017   Target end date:       Indications    Paroxysmal atrial fibrillation ([place]) [I48.0]             Anticoagulation Episode Summary     INR check location:       Send INR reminders to:   [facility] EDM CLINICAL SUPPORT STAFF POOL    Comments:               SUBJECTIVE    Patient Findings     Negatives:   Signs/symptoms of thrombosis, Signs/symptoms of bleeding, Laboratory test error suspected, Change in health, Change in alcohol use, Change in activity, Upcoming invasive procedure, Emergency department visit, Upcoming dental procedure, Missed doses, Extra doses, Change in medications, Change in diet/appetite, Hospital admission, Bruising, Other complaints    Comments:   Is eating Kale salad less frequently.          OBJECTIVE    Anticoag Visit Date 07/06/2017 06/22/2017 06/08/2017 05/18/2017 05/06/2017 04/27/2017 04/18/2017   INR Goal 2.0-3.0 2.0-3.0 2.0-3.0 2.0-3.0 2.0-3.0 2.0-3.0 2.0-3.0   Selected INR 2.1 1.9 1.8 2.3 2.0 2.2 2.0   INR Date 07/06/2017 06/22/2017 06/08/2017 05/18/2017 05/06/2017 04/27/2017 04/18/2017   Previous Week Total 40 mg 37.5 mg 35 mg 35 mg 35 mg 35 mg 32.5 mg   Pt Deviation No No No No No No No   Pt Findings Comments Is eating Kale salad less frequently. - Eating more greens.  One fading bruise on left big toe. Diverticulitis flare up, diet changes, took 1/2 dose of Warfarin on Wednesday - Has been following "NOOM" program for last 3 weeks, eating more vegetables and increased activity, has lost about 7 lbs   Next Week Total 40 mg 40 mg 37.5 mg 35 mg 35 mg 35 mg 35 mg   Full Instructions 7.5 mg every Mon, Thu; 5 mg all other days 7.5 mg every Wed, Fri; 5 mg all other days 7.5 mg every Wed; 5 mg all other days 5 mg every day 5 mg every day 5 mg every day 5 mg  every day   Date of Next INR 07/27/2017 07/06/2017 06/22/2017 06/08/2017 05/18/2017 05/18/2017 04/25/2017       ASSESSMENT    Therapeutic INR in stable patient and without complications.     PLAN    1.  Continue Warfarin and recheck in 3 weeks.  .  As of 07/06/2017    Warfarin maintenance plan:   7.5 mg every Mon, Thu; 5 mg all other days   Full warfarin instructions:   7.5 mg every Mon, Thu; 5 mg all other days   Weekly warfarin total:   40 mg   Next INR check:   07/27/2017         Anticoagulation Episode Summary     Comments:             2. Starlina Lapre acknowledged understanding of this plan      PATIENT INSTRUCTIONS    June 2019 Details    Sun Mon Tue Wed Thu Fri Sat  1                 2               3               4               5               6               7               8                 9               10               11               12               13               14               15                 16               17               18               19      5  mg   See details      20      7.5 mg         21      5 mg         22      5 mg           23      5 mg         24      7.5 mg         25      5 mg         26      5 mg         27      7.5 mg         28      5 mg         29      5 mg           30      5 mg                Date Details   06/19 This INR check                 July 2019 Details    Sun Mon Tue Wed Thu Fri Sat      1      7.5 mg         2      5 mg         3      5 mg         4      7.5 mg         5      5 mg  6      5 mg           7      5 mg         8      7.5 mg         9      5 mg         10      5 mg         11               12               13                 14               15               16               17               18               19               20                 21               22               23               24               25               26               27                 28               29               30               31                     Date Details   No additional details    Date of next INR:  07/27/2017

## 2017-07-07 NOTE — Telephone Encounter (Signed)
I called Yar[place] and let her know her Disabled Parking Permit was signed by Dr Dorothyann Peng.    She will come by the front desk here to pick it up.     Envelop put at front desk.    Orvilla Cornwall RN, [facility] Team

## 2017-07-27 ENCOUNTER — Ambulatory Visit: Payer: Medicare HMO | Attending: Internal Medicine

## 2017-07-27 DIAGNOSIS — I480 Paroxysmal atrial fibrillation: Secondary | ICD-10-CM | POA: Insufficient documentation

## 2017-07-27 LAB — PR PROTHROMBIN TIME, ONSITE: prothrombin INR: 2.2

## 2017-07-27 NOTE — Progress Notes (Signed)
Anticoag Treatment Plan  As of 07/27/2017    INR goal:   2.0-3.0   TTR:   83.7 % (3.9 y)   INR used for dosing:   2.2 (07/27/2017)   Full warfarin instructions:   7.5 mg every Mon, Thu; 5 mg all other days   Weekly warfarin total:   40 mg   Next INR check:   08/27/2017   Target end date:       Indications    Paroxysmal atrial fibrillation ([place]) [I48.0]             Anticoagulation Episode Summary     INR check location:       Send INR reminders to:   [facility] EDM CLINICAL SUPPORT STAFF POOL    Comments:               SUBJECTIVE    Patient Findings     Negatives:   Signs/symptoms of thrombosis, Signs/symptoms of bleeding, Laboratory test error suspected, Change in health, Change in alcohol use, Change in activity, Upcoming invasive procedure, Emergency department visit, Upcoming dental procedure, Missed doses, Extra doses, Change in medications, Change in diet/appetite, Hospital admission, Bruising, Other complaints          OBJECTIVE    Anticoag Visit Date 07/27/2017 07/06/2017 06/22/2017 06/08/2017 05/18/2017 05/06/2017 04/27/2017   INR Goal 2.0-3.0 2.0-3.0 2.0-3.0 2.0-3.0 2.0-3.0 2.0-3.0 2.0-3.0   Selected INR 2.2 2.1 1.9 1.8 2.3 2.0 2.2   INR Date 07/27/2017 07/06/2017 06/22/2017 06/08/2017 05/18/2017 05/06/2017 04/27/2017   Previous Week Total 40 mg 40 mg 37.5 mg 35 mg 35 mg 35 mg 35 mg   Pt Deviation No No No No No No No   Pt Findings Comments - Is eating Kale salad less frequently. - Eating more greens.  One fading bruise on left big toe. Diverticulitis flare up, diet changes, took 1/2 dose of Warfarin on Wednesday -   Next Week Total 40 mg 40 mg 40 mg 37.5 mg 35 mg 35 mg 35 mg   Full Instructions 7.5 mg every Mon, Thu; 5 mg all other days 7.5 mg every Mon, Thu; 5 mg all other days 7.5 mg every Wed, Fri; 5 mg all other days 7.5 mg every Wed; 5 mg all other days 5 mg every day 5 mg every day 5 mg every day   Date of Next INR 08/27/2017 07/27/2017 07/06/2017 06/22/2017 06/08/2017 05/18/2017 05/18/2017       ASSESSMENT    Therapeutic INR in stable  patient and without complications.     PLAN    1.  Continue Warfarin. No change in dose. Warfarin to 7.5 mg x 2 days a week (Mon/Thur) and 5 mg x 5 days a week.  Re check INR in 4 weeks.  .  As of 07/27/2017    Warfarin maintenance plan:   7.5 mg every Mon, Thu; 5 mg all other days   Full warfarin instructions:   7.5 mg every Mon, Thu; 5 mg all other days   Weekly warfarin total:   40 mg   No change documented:   Olean Ree, RN   Next INR check:   08/27/2017         Anticoagulation Episode Summary     Comments:             2. Preston Weill acknowledged understanding of this plan      PATIENT INSTRUCTIONS    July 2019 Details    Sun Mon Tue Wed Thu  Fri Sat      1               2               3               4               5               6                 7               8               9               10      5  mg   See details      11      7.5 mg         12      5 mg         13      5 mg           14      5 mg         15      7.5 mg         16      5 mg         17      5 mg         18      7.5 mg         19      5 mg         20      5 mg           21      5 mg         22      7.5 mg         23      5 mg         24      5 mg         25      7.5 mg         26      5 mg         27      5 mg           28      5 mg         29      7.5 mg         30      5 mg         31      5 mg             Date Details   07/10 This INR check                 August 2019 Details    Sun Mon Tue Wed Thu Fri Sat         1      7.5 mg         2      5 mg         3      5 mg  4      5 mg         5      7.5 mg         6      5 mg         7      5 mg         8      7.5 mg         9      5 mg         10      5 mg           11               12               13               14               15               16               17                 18               19               20               21               22               23               24                 25               26               27               28                29               30               31                 Date Details   No additional details    Date of next INR:  08/27/2017

## 2017-08-18 ENCOUNTER — Encounter (INDEPENDENT_AMBULATORY_CARE_PROVIDER_SITE_OTHER): Payer: Medicare HMO | Admitting: Family Medicine

## 2017-08-23 ENCOUNTER — Telehealth (HOSPITAL_BASED_OUTPATIENT_CLINIC_OR_DEPARTMENT_OTHER): Payer: Self-pay | Admitting: Internal Medicine

## 2017-08-23 ENCOUNTER — Encounter (INDEPENDENT_AMBULATORY_CARE_PROVIDER_SITE_OTHER): Payer: Self-pay | Admitting: Family Medicine

## 2017-08-23 ENCOUNTER — Ambulatory Visit (INDEPENDENT_AMBULATORY_CARE_PROVIDER_SITE_OTHER): Payer: Medicare HMO | Admitting: Family Medicine

## 2017-08-23 VITALS — BP 115/72 | HR 80 | Temp 97.2°F | Resp 16 | Wt 280.0 lb

## 2017-08-23 DIAGNOSIS — M170 Bilateral primary osteoarthritis of knee: Secondary | ICD-10-CM

## 2017-08-23 DIAGNOSIS — I480 Paroxysmal atrial fibrillation: Secondary | ICD-10-CM

## 2017-08-23 DIAGNOSIS — N950 Postmenopausal bleeding: Secondary | ICD-10-CM

## 2017-08-23 DIAGNOSIS — J4520 Mild intermittent asthma, uncomplicated: Secondary | ICD-10-CM

## 2017-08-23 DIAGNOSIS — R600 Localized edema: Secondary | ICD-10-CM

## 2017-08-23 DIAGNOSIS — K219 Gastro-esophageal reflux disease without esophagitis: Secondary | ICD-10-CM

## 2017-08-23 DIAGNOSIS — Z6841 Body Mass Index (BMI) 40.0 and over, adult: Secondary | ICD-10-CM

## 2017-08-23 LAB — COMPREHENSIVE METABOLIC PANEL
ALT (GPT): 11 U/L (ref 7–33)
AST (GOT): 14 U/L (ref 9–38)
Albumin: 4.1 g/dL (ref 3.5–5.2)
Alkaline Phosphatase (Total): 90 U/L (ref 38–172)
Anion Gap: 13 — ABNORMAL HIGH (ref 4–12)
Bilirubin (Total): 0.7 mg/dL (ref 0.2–1.3)
Calcium: 10 mg/dL (ref 8.9–10.2)
Carbon Dioxide, Total: 25 meq/L (ref 22–32)
Chloride: 105 meq/L (ref 98–108)
Creatinine: 0.91 mg/dL (ref 0.38–1.02)
GFR, Calc, African American: >60 mL/min/{1.73_m2} (ref >59)
GFR, Calc, European American: >60 mL/min/{1.73_m2} (ref >59)
Glucose: 115 mg/dL (ref 62–125)
Potassium: 3.5 meq/L — ABNORMAL LOW (ref 3.6–5.2)
Protein (Total): 6.9 g/dL (ref 6.0–8.2)
Sodium: 143 meq/L (ref 135–145)
Urea Nitrogen: 17 mg/dL (ref 8–21)

## 2017-08-23 LAB — LIPID PANEL
Cholesterol (LDL): 117 mg/dL (ref <130)
Cholesterol/HDL Ratio: 3.6
HDL Cholesterol: 54 mg/dL (ref >39)
Non-HDL Cholesterol: 142 mg/dL (ref 0–159)
Total Cholesterol: 196 mg/dL (ref <200)
Triglyceride: 126 mg/dL (ref <150)

## 2017-08-23 LAB — PR PROTHROMBIN TIME, ONSITE: prothrombin INR: 1.9

## 2017-08-23 MED ORDER — MONTELUKAST SODIUM 10 MG OR TABS
10.0000 mg | ORAL_TABLET | Freq: Every day | ORAL | 3 refills | Status: DC
Start: 2017-08-23 — End: 2017-12-29

## 2017-08-23 MED ORDER — FLUTICASONE PROPIONATE HFA 110 MCG/ACT IN AERO
2.0000 | INHALATION_SPRAY | Freq: Two times a day (BID) | RESPIRATORY_TRACT | 5 refills | Status: DC
Start: 2017-08-23 — End: 2017-11-03

## 2017-08-23 MED ORDER — TRIAMTERENE-HCTZ 37.5-25 MG OR TABS
1.0000 | ORAL_TABLET | Freq: Every day | ORAL | 3 refills | Status: DC
Start: 2017-08-23 — End: 2017-12-29

## 2017-08-23 MED ORDER — RANITIDINE HCL 150 MG OR TABS
150.0000 mg | ORAL_TABLET | Freq: Two times a day (BID) | ORAL | 3 refills | Status: DC
Start: 2017-08-23 — End: 2018-05-17

## 2017-08-23 NOTE — Telephone Encounter (Signed)
Pt called , said she got labs as well as her INR done at the [facility] clinic.     She would like a call back to discuss those results.

## 2017-08-23 NOTE — Telephone Encounter (Signed)
@[email or web address] @      SUBJECTIVE        OBJECTIVE    Anticoag Visit Date 08/23/2017 07/27/2017 07/06/2017 06/22/2017 06/08/2017 05/18/2017 05/06/2017   INR Goal 2.0-3.0 2.0-3.0 2.0-3.0 2.0-3.0 2.0-3.0 2.0-3.0 2.0-3.0   Selected INR 1.9 2.2 2.1 1.9 1.8 2.3 2.0   INR Date 08/23/2017 07/27/2017 07/06/2017 06/22/2017 06/08/2017 05/18/2017 05/06/2017   Previous Week Total 40 mg 40 mg 40 mg 37.5 mg 35 mg 35 mg 35 mg   Pt Deviation No No No No No No No   Pt Findings Comments - - Is eating Kale salad less frequently. - Eating more greens.  One fading bruise on left big toe. Diverticulitis flare up, diet changes, took 1/2 dose of Warfarin on Wednesday   Next Week Total 40 mg 40 mg 40 mg 40 mg 37.5 mg 35 mg 35 mg   Full Instructions 7.5 mg every Mon, Thu; 5 mg all other days 7.5 mg every Mon, Thu; 5 mg all other days 7.5 mg every Mon, Thu; 5 mg all other days 7.5 mg every Wed, Fri; 5 mg all other days 7.5 mg every Wed; 5 mg all other days 5 mg every day 5 mg every day   Date of Next INR 09/02/2017 08/27/2017 07/27/2017 07/06/2017 06/22/2017 06/08/2017 05/18/2017       ASSESSMENT    Subtherapeutic INR without complications     PLAN    1. INR 1.9 today on 8/6 at [facility] clinic. Patient was on warfarin 7.5 mg x 2 days a week (Mon/Thur) and 5 mg x 5 days a week. Warfarin increased to 7.5 mg x 3 days/week (M, W, F) and 5 mg on all other days. Patient to get recheck in 10 days. Reviewed per MD protocol.       2. Caitlin Wiley acknowledged understanding of this plan      PATIENT INSTRUCTIONS    August 2019 Details    Sun Glori Luis Thu Fri Sat         1               2               3                 4               5               6      5  mg   See details      7      5 mg         8      7.5 mg         9      5 mg         10      5 mg           11      5 mg         12      7.5 mg         13      5 mg         14      5 mg         15      7.5 mg         16      5 mg  17                 18               19               20             21               22               23               24                 25               26               27               28               29               30               31                 Date Details   08/06 This INR check       Date of next INR:  09/02/2017

## 2017-08-23 NOTE — Progress Notes (Signed)
HPI  Caitlin Wiley is a 67 year old female who presents to establish care.  She was previously seen in the [facility] system.  She has several chronic issues including osteoarthritis of multiple joints, mild asthma, hypertension, GERD and Afib.    She has 2 acute concerns.  Her knee pain is worsening.  Now unable to walk well without a walker.  Thinks the left knee was exacerbated by recent water exercises.  She is asking for an orthopedic referral.  Though she states she is hesitant about the possibility of surgery.    Her other issue is post menopausal bleeding.  She had an issue with this in 2017.  Had a negative EMB and [place].  Was told she may have a uterine polyp.  3 weeks ago she had another bleed with a clot.  Not excited about getting an EMB (sounds like dilation was difficult).  OK with an [place].    She is asking for refills of a few meds:  Maxide, Flovent, montelukast, ranitidine.    She is open to getting labs today.  Asking to add on INR with is managed by her cardiology clinic.    Review of patient's allergies indicates:  Allergies   Allergen Reactions    Bee Venom     Cats [Animals]     Ciprofloxacin      Joint aching    Dust Mite Extract      DUST    Pneumococcal Vaccines     Pollen Extract      POLLEN    Sulfa Antibiotics          Social History     Tobacco Use    Smoking status: Never Smoker    Smokeless tobacco: Never Used   Substance Use Topics    Alcohol use: No     Alcohol/week: 0.0 standard drinks     Comment: rare        ROS  Constitutional:  Positive for weight loss (intentional),  Denies fever, chills or fatigue  Eyes:  Denies double vision, seeing spots, eye pain or vision changes  Ear/Nose/Throat/Mouth:  Positive for ringing in ears,  Denies hearing loss, sinus problems, voice    changes or swallowing problems  Cardiovascular:  Positive for leg swelling,  Denies chest pain, palpitations, light headedness, or    passing out  Respiratory:  Denies wheezing, shortness of breath, chronic  cough  Gastrointestinal:  Positive for constipation,  Denies abdominal pain, nausea, vomitting, diarrhea,    blood in stool, black stool  Genitourinary:  Denies blood in urine, pain with urination, urgency/frequency, incomplete    emptying, incontinence, sexual problems   Women:  Denies painful periods, pain with intercourse, abnormal vaginal discharge  Musculoskeletal:  Positive for joint pain,  Denies muscle pain or weakness, bone pain  Skin/Breast:  Positive for yeast rash,  Denies pain in breast, breast discharge, breast mass, rash  Neurologic:  Positive for dizziness, trouble walking,  Denies seizures, numbness, headaches  Psychiatric:  Positive for depression, anxiety,  Denies trouble sleeping, suicidal thoughts  Endocrine:  Denies hot flashes, abnormal thirst, history of thyroid problems or diabetes  Hematologic/Lymphatic:  Positive for enlarged lymph nodes,  Denies frequent bruising, easy bleeding, history of blood clots  Allergic/Immunologic:  Positive for allergies          Physical Exam  BP 115/72    Pulse 80    Temp 97.2 F (36.2 C) (Temporal)    Resp 16    Wt [name])  280 lb (127 kg)    SpO2 93%    BMI 44.52 kg/m   General:  alert, cooperative, no acute distress  Head:  AT/NC  Neck:  supple, no adenopathy or thyromegaly  Lung:  clear to auscultation bilaterally  Heart:  regular rate and rythmn, no murmurs  Abdomen: positive bowel sounds, soft, nontender, no masses   Extremity exam:  warm, pink, 3+ edema left>right  Skin:  no rash   Psych:  Affect normal; hygiene appropriate; thought process linear  Neuro:  Uses walker to ambulate          Assesment/Plan  (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  Ortho referral placed per request.      (N95.0) Post-menopausal bleeding  Plan: US PELVIS COMPLETE        Will obtain US.  If any abnormality, will refer to Gyn for EMB.    (Z68.41) BMI 40.0-44.9, adult  ([place])  Plan: Lipid Panel, Comprehensive Metabolic Panel            (R60.0) Edema of both legs  Plan: triamterene-hydrochlorothiazide 37.5-25 MG Oral        Tab            (J45.20) Mild intermittent asthma without complication  Plan: Montelukast Sodium 10 MG Oral Tab, Fluticasone         Propionate HFA (FLOVENT HFA) 110 MCG/ACT         Inhalation Aerosol            (K21.9) Gastroesophageal reflux disease without esophagitis  Plan: raNITIdine HCl 150 MG Oral Tab            (I48.0) Paroxysmal atrial fibrillation ([facility])  Plan: PROTHROMBIN TIME, ONSITE

## 2017-08-23 NOTE — Telephone Encounter (Signed)
See separate Anticoag note

## 2017-08-23 NOTE — Telephone Encounter (Signed)
I called patient and she notified me she forgot and took 5 mg instead of 7.5 mg yesterday. I noticed she has still been running low, so I instructed her to increase warfarin to 7.5 mg 3days/week (M, W,F) and 5 mg all other days (Tues, Th, Sat, Sun). Patient read back instructions and verbalizes understanding. Made apt with patient to return for INR draw on Thursday 8/15 at 9:15 am. Pt has no further questions/concerns.

## 2017-08-23 NOTE — Progress Notes (Signed)
Visit: pt is here to transfer care. Pt needs medications refilled. 2017 had spotting had a negative endometrial Bx. Pt notes a few months ago had another spotting episode with a colt. Pt has hx diverticulitis and vertigo. Pt would like to discuss referral to derm and bone and joint.      Refills? YES  Referral? YES  Letter or Form? NO  Lab Results? WOULD LIKE INR CHECKED    HEALTH MAINTENANCE:  Has the patient has this done since their last visit?  Cervical screening/PAP: N/A  Mammo: [place] just completed   Colon Screen: N/A  Diabetic Eye Exam (If applicable): N/A    Have you seen a specialist since your last visit: YES- cardio and knee     Vaccines Due? No    Does patient have eCare?  YES     HM Due:   Health Maintenance   Topic Date Due    Zoster Vaccine (1 of 2) 03/02/2001    Osteoporosis Screening  04/29/2017    Depression Screening (PHQ-2)  08/10/2017    Medicare Annual Wellness Visit  08/10/2017    Pneumococcal Vaccine: 65+ years (2 of 2 - PPSV23) 08/20/2017    Influenza Vaccine (1) 10/18/2017    Breast Cancer Screening  07/30/2018    Diabetes Screening  10/15/2019    Tetanus Vaccine  03/13/2020    Colorectal Cancer Screening (Colonoscopy)  04/02/2021    Lipid Disorders Screening  10/14/2021    Hepatitis C Screening  Completed    Pneumococcal Vaccine: Pediatrics (0-5 years) and At-Risk Patients (6-64 years)  Aged Out       PCP Verified?  Yes, Hagedorn, Ashok Norris, MD

## 2017-08-24 ENCOUNTER — Encounter (HOSPITAL_BASED_OUTPATIENT_CLINIC_OR_DEPARTMENT_OTHER): Payer: Medicare HMO

## 2017-08-25 ENCOUNTER — Encounter (HOSPITAL_BASED_OUTPATIENT_CLINIC_OR_DEPARTMENT_OTHER): Payer: Self-pay | Admitting: Family Medicine

## 2017-08-25 ENCOUNTER — Telehealth (INDEPENDENT_AMBULATORY_CARE_PROVIDER_SITE_OTHER): Payer: Self-pay | Admitting: Orthopaedic Surgery

## 2017-08-25 ENCOUNTER — Encounter (INDEPENDENT_AMBULATORY_CARE_PROVIDER_SITE_OTHER): Payer: Self-pay | Admitting: Family Medicine

## 2017-08-25 DIAGNOSIS — N950 Postmenopausal bleeding: Secondary | ICD-10-CM

## 2017-08-25 NOTE — Telephone Encounter (Signed)
Called patient back and explained she will not be offered surgery at this time due to BMI being to high. Needs to be under 40 to be a candidate for surgery.      Patient scheduled with Dr. Patrick Jupiter.

## 2017-08-25 NOTE — Telephone Encounter (Signed)
(  TEXTING IS AN OPTION FOR [facility] CLIN[facility] ONLY)  Is this a [facility] clinic? No      RETURN CALL: Detailed message on voicemail only      SUBJECT:  Appointment Request     REASON FOR REQUEST/SYMPTOMS: Osteoarthritis of both knees  REFERRING PROVIDER: El-Attar  REQUEST APPOINTMENT WITH: Manner  REQUESTED DATE: n/a, TIME: n/a  UNABLE TO APPOINT BECAUSE: Symptom not listed in PST. Referral recommended to see Clawson, but provider is wanting to see Manner or another surgeon. Please follow up. Thank you.

## 2017-08-29 ENCOUNTER — Ambulatory Visit: Payer: Medicare HMO | Attending: Family Medicine

## 2017-08-29 DIAGNOSIS — N950 Postmenopausal bleeding: Secondary | ICD-10-CM | POA: Insufficient documentation

## 2017-08-30 ENCOUNTER — Ambulatory Visit (INDEPENDENT_AMBULATORY_CARE_PROVIDER_SITE_OTHER): Payer: Self-pay | Admitting: Family Medicine

## 2017-08-30 NOTE — Telephone Encounter (Signed)
Patient with worsening left knee/calf pain. Knee is less stable, feels it will give out at times when standing.  Started July 16th, is using walker now, is asking for PT referal.  Last seen in clinic 8/6. Patient wanted to let provider know that this is a new symptom, although she has had chronic knee pain. This pain in knee/calf, and knee instability is new since 7/16.    Reason for Disposition   MODERATE pain (e.g., symptoms interfere with work or school, limping) and present > 3 days    Additional Information   Negative: Sounds like a life-threatening emergency to the triager   Negative: Followed a knee injury   Negative: Swollen knee joint and fever   Negative: SEVERE pain (e.g., excruciating, unable to walk)   Negative: Very swollen joint   Negative: Painful rash with multiple small blisters grouped together (i.e., dermatomal distribution or 'band' or 'stripe')   Negative: Looks like a boil, infected sore, deep ulcer, or other infected rash (spreading redness, pus)   Negative: Can't move swollen joint at all   Negative: Thigh or calf pain and only 1 side and present > 1 hour   Negative: Thigh or calf swelling and only 1 side   Negative: Patient sounds very sick or weak to the triager    Protocols used: KNEE PAIN-ADULT-[place]

## 2017-08-30 NOTE — Telephone Encounter (Signed)
Pt informed via eCare.

## 2017-08-30 NOTE — Telephone Encounter (Signed)
eCare message below received, symptom-based message.     Sending to Nurse Triage Pool.     "Dr. Velvet Bathe, Thank you for the visit summary. While I do have a chronic problem with osteoarthritis of both knees. The recent pain in my left leg has caused me to switch from a cane to a walker. (A friend gave me her old one) It started on July 16th when I woke and wasn't able to put weight on either leg. Gradually the right leg has come back to functional, but the left leg hasn't. The pain in my leg is quite severe at times (7-8) and it is not just in the knee area. I can have severe pain that shoots the back of my calf and hamstrings when I change positions. I am not scheduled to see the Dr. for consultation until August 26th. I would like some relief before then, as I think the Dr. is only to "prepare me for surgery" (weight loss tips) and not really to address my current issue. Do you think physical therapy might help? It has helped me in the past. If so, I have used [facility] in the past (shares a parking lot with your clinic). Would you please prescribe it if you think   it would help.    Thank you, Caitlin Wiley  [phone]"

## 2017-08-30 NOTE — Telephone Encounter (Signed)
Should see Dr. Doneen Poisson as scheduled for further evaluation.

## 2017-08-30 NOTE — Telephone Encounter (Signed)
Symptom-based message.     Separate TE created and routed to Nurse Triage Pool for further review.     Closing.

## 2017-08-31 NOTE — Progress Notes (Signed)
Visit: knee pain, INR    Refills? NO  Referral? NO  Letter or Form? NO  Lab Results? YES    HEALTH MAINTENANCE:  Has the patient has this done since their last visit?  Cervical screening/PAP: N/A  Mammo: 2021  Colon Screen: 2023  Diabetic Eye Exam (If applicable): N/A    Have you seen a specialist since your last visit: No    Vaccines Due? Yes, zoster, PPSV23    Does patient have eCare?  Yes    HM Due:   Health Maintenance   Topic Date Due    Zoster Vaccine (1 of 2) 03/02/2001    Osteoporosis Screening  04/29/2017    Medicare Annual Wellness Visit  08/10/2017    Pneumococcal Vaccine: 65+ years (2 of 2 - PPSV23) 08/20/2017    Influenza Vaccine (1) 10/18/2017    Depression Screening (PHQ-2)  08/24/2018    Breast Cancer Screening  08/12/2019    Tetanus Vaccine  03/13/2020    Diabetes Screening  08/23/2020    Colorectal Cancer Screening (Colonoscopy)  04/02/2021    Lipid Disorders Screening  08/24/2022    Hepatitis C Screening  Completed    Pneumococcal Vaccine: Pediatrics (0-5 years) and At-Risk Patients (6-64 years)  Aged Out       PCP Verified?  Yes, El-Attar, Hanley Seamen, MD

## 2017-09-01 ENCOUNTER — Ambulatory Visit (INDEPENDENT_AMBULATORY_CARE_PROVIDER_SITE_OTHER): Payer: Medicare HMO | Admitting: Family Medicine

## 2017-09-01 ENCOUNTER — Encounter (INDEPENDENT_AMBULATORY_CARE_PROVIDER_SITE_OTHER): Payer: Self-pay | Admitting: Family Medicine

## 2017-09-01 ENCOUNTER — Encounter (HOSPITAL_BASED_OUTPATIENT_CLINIC_OR_DEPARTMENT_OTHER): Payer: Medicare HMO

## 2017-09-01 VITALS — BP 90/63 | HR 94 | Temp 96.7°F | Resp 16 | Wt 280.0 lb

## 2017-09-01 DIAGNOSIS — M79605 Pain in left leg: Secondary | ICD-10-CM

## 2017-09-01 DIAGNOSIS — Z0000 Encounter for general adult medical examination without abnormal findings: Secondary | ICD-10-CM

## 2017-09-01 DIAGNOSIS — Z6841 Body Mass Index (BMI) 40.0 and over, adult: Secondary | ICD-10-CM

## 2017-09-01 NOTE — Progress Notes (Signed)
Caitlin Wiley is a 67 year old female who presents with knee pain on the left for 1 month.  She was seen 08/23/17 and one of her concerns at that time was knee pain.  They generated a referral to ortho.   Sharp severe pain in L calf and hamstring. Started about 1 month ago. Seems different than her arthritis. Started 08/02/17. Had been slightly achy the day before, but much more sore starting 7/16.  The week prior was exercising daily instead of 3x/wk  Needs walker to get around.  Not improving    Has lost 40# and wants to be able to continue to exercise    Swollen hot pain in arm that spread to rest of body    Problem list, PMH, PSH, family history, social history reviewed and amended as appropriate (please see history tabs in EMR.)    Ros:     PHYSICAL EXAMINATION:  Objective:healthy, alert, no distress  BP 90/63    Pulse 94    Temp 96.7 F (35.9 C) (Temporal)    Resp 16    Wt (!) 280 lb (127 kg) Comment: per pt    SpO2 95%    BMI 44.52 kg/m    LE swelling: mild, chronic  DP pulses 2+  DTRs 2+  Sensation intact to light tough  gait: antaltagic  sitting: no swelling, ecchymosis, or deformity noted  Palpation: no effusion present, no joint line or patellar tenderness and no tenderness of tibial tuberosity. TTP in calf and hamstring, none in popliteal fossa.   Range of motion: full active extension and full active flexion    Left leg pain  Calf and hamstring stain most likely. Refer to PT for exercise routine she can do to continue her weight loss.   - REFERRAL TO PHYSICAL THERAPY    ---------------------------------------  ANNUAL WELLNESS VISIT    Caitlin Wiley is a 67 year old female who presents for an Annual Wellness Visit.   []  Initial   [x]  Subsequent     INFORMATION GATHERING:  The following areas were confirmed with patient/caregiver and/or updated in Epic at this visit (required):    [x]  Past Medical History   [x]  Past Surgical History   [x]  Family History   [x]  Social History    [x]  Current  medications and supplements (including vitamins and calcium)   [x]  Allergies    The information below is up to date at the end of this  visit (required):  Review of functional ability, hearing, fall risk and safety, diet, physical activity, and health habits  (via HRA or direct review with patient or caregiver)    The Health Risk Assessment (HRA) for today's visit (required) was completed (check one):   []   as Patient-Entered Questionnaire via eCare (visible in eCare Questionnaire                     Encounter and in Synopsis)   [x]  as paper HRA form, completed by or with the patient or caregiver (reminder:                    paper form, if completed, must be scanned to Media)   []  in HRA template documentation, completed by staff or provider with patient or  caregiver at visit (for use when HRA was not completed prior to rooming)      List of current providers and suppliers (check all that apply):   [x]   See Care Team  Section   []   See EHR Encounters for [facility] Providers involved in care   []   Other providers and suppliers outside [facility]     Depression screening:  PHQ-2:  0  PHQ-9 (if done):      EXAM:  BP 90/63    Pulse 94    Temp 96.7 F (35.9 C) (Temporal)    Resp 16    Wt (!) 280 lb (127 kg) Comment: per pt    SpO2 95%    BMI 44.52 kg/m   GAIT:    []  Normal, stable, independent    [x]  Abnormal (describe):uses walker    As assessed by:     [x]  Direct Observation     []  Timed Up and Go     []  Other:  COGNITION:    [x]  Intact    []  Abnormal (describe):     As assessed by:      [x]  Direct Observation     []  Brief Cognitive Screen      ADVANCE CARE PLANNING (ACP) (optional)  Advance care planning:   []  Patient Accepted   [x]  Patient Declined     Check all that apply:   []  Advance directives are on file in her chart   []  Explained & discussed advance directives at this visit   []  Completed advance care planning form(s) at this visit   []  Other Advance Care Planning discussion at this visit (describe):        Time Spent on ACP: [x]  None       []  1-15 minutes      []  16-30 minutes      []  >30 minutes      ASSESSMENT:  Caitlin Wiley was seen for her Annual Wellness Visit, including identification of risk factors & conditions that may affect her health and function in the future.   (M79.605) Left leg pain  (primary encounter diagnosis)  Plan: REFERRAL TO PHYSICAL THERAPY         Actions at this visit (all 3 required):   [x]   Establishing or updating a written schedule of screening and prevention  measures recommended and appropriate for Caitlin Wiley for the next 5-10 years   [x]   Establishing or updating a list of her risk factors and conditions for which  lifestyle or medical interventions are recommended or underway, including  mental health risks and conditions, and including risks/benefits of treatment   [x]   Furnishing personalized health advice and, as appropriate, referrals to health  education or preventive counseling services or programs (such as fall  prevention, tobacco cessation, physical activity, nutrition, weight loss)      COUNSELING:      Here are screening & prevention measures recommended for you:  Health Maintenance   Topic Date Due    Zoster Vaccine (1 of 2) 03/02/2001    Osteoporosis Screening  04/29/2017    Medicare Annual Wellness Visit  08/10/2017    Pneumococcal Vaccine: 65+ years (2 of 2 - PPSV23) 08/20/2017    Influenza Vaccine (1) 10/18/2017    Depression Screening (PHQ-2)  08/24/2018    Breast Cancer Screening  08/12/2019    Tetanus Vaccine  03/13/2020    Diabetes Screening  08/23/2020    Colorectal Cancer Screening (Colonoscopy)  04/02/2021    Lipid Disorders Screening  08/24/2022    Hepatitis C Screening  Completed    Pneumococcal Vaccine: Pediatrics (0-5 years) and At-Risk Patients (6-64 years)  Aged Out  Based on todays evaluation, I recommend the following ways to improve your health or functioning:     Continue regular exercise and weight loss      You have the following risk factors and/or medical conditions for which there are recommended ways (included in the list) to help you stay as healthy as possible:  1.    P a fib-continue coumadin       Please plan to have a Subsequent Annual Wellness Visit in 1 year.

## 2017-09-02 ENCOUNTER — Ambulatory Visit: Payer: Medicare HMO | Attending: Internal Medicine

## 2017-09-02 DIAGNOSIS — I480 Paroxysmal atrial fibrillation: Secondary | ICD-10-CM | POA: Insufficient documentation

## 2017-09-02 LAB — PR PROTHROMBIN TIME, ONSITE: prothrombin INR: 2.1

## 2017-09-02 NOTE — Progress Notes (Signed)
Anticoag Treatment Plan  As of 09/02/2017    INR goal:   2.0-3.0   TTR:   83.2 % (4 y)   INR used for dosing:   2.1 (09/02/2017)   Full warfarin instructions:   7.5 mg every Mon, Thu; 5 mg all other days   Weekly warfarin total:   40 mg   Next INR check:   09/30/2017   Target end date:       Indications    Paroxysmal atrial fibrillation ([place]) [I48.0]             Anticoagulation Episode Summary     INR check location:       Send INR reminders to:   [facility] EDM CLINICAL SUPPORT STAFF POOL    Comments:               SUBJECTIVE    Patient Findings     Positives:   Change in health, Change in activity, Upcoming invasive procedure    Negatives:   Signs/symptoms of thrombosis, Signs/symptoms of bleeding, Laboratory test error suspected, Change in alcohol use, Emergency department visit, Upcoming dental procedure, Missed doses, Extra doses, Change in medications, Change in diet/appetite, Hospital admission, Bruising, Other complaints    Comments:   Patient using walker now due to left leg pain, referred to physical therapy.  Pt supposed to be having endometrial biopsy, not scheduled yet. Has been taking warfarin 7.5 mg MWF and 5 mg all other days. Pt to continue this dose and return for INR in 1 month.           OBJECTIVE    Anticoag Visit Date 09/02/2017 08/23/2017 07/27/2017 07/06/2017 06/22/2017 06/08/2017 05/18/2017   INR Goal 2.0-3.0 2.0-3.0 2.0-3.0 2.0-3.0 2.0-3.0 2.0-3.0 2.0-3.0   Selected INR 2.1 1.9 2.2 2.1 1.9 1.8 2.3   INR Date 09/02/2017 08/23/2017 07/27/2017 07/06/2017 06/22/2017 06/08/2017 05/18/2017   Previous Week Total 40 mg 40 mg 40 mg 40 mg 37.5 mg 35 mg 35 mg   Pt Deviation No No No No No No No   Pt Findings Comments Patient using walker now due to left leg pain, referred to physical therapy.  Pt supposed to be having endometrial biopsy, not scheduled yet. Has been taking warfarin 7.5 mg MWF and 5 mg all other days. Pt to continue this dose and return for INR in   1 month.  Patient forgot and took 5 mg instead of 7.5 mg  yesterday. She continues to eat greens daily and has been consistent with diet. No other findings.  - Is eating Kale salad less frequently. - Eating more greens.  One fading bruise on left big toe.   Next Week Total 40 mg 40 mg 40 mg 40 mg 40 mg 37.5 mg 35 mg   Full Instructions 7.5 mg every Mon, Thu; 5 mg all other days 7.5 mg every Mon, Thu; 5 mg all other days 7.5 mg every Mon, Thu; 5 mg all other days 7.5 mg every Mon, Thu; 5 mg all other days 7.5 mg every Wed, Fri; 5 mg all other days 7.5 mg every Wed; 5 mg all other days 5 mg every day   Date of Next INR 09/30/2017 09/02/2017 08/27/2017 07/27/2017 07/06/2017 06/22/2017 06/08/2017       ASSESSMENT    Therapeutic INR following warfarin dose adjustment and without complications.     PLAN    1.  Continue Warfarin 7.5 mg x3days/week (MWF) and 5 mg all other days. Recheck in 1 month. Reviewed  per MD protocol.   .  As of 09/02/2017    Warfarin maintenance plan:   7.5 mg every Mon, Thu; 5 mg all other days   Full warfarin instructions:   7.5 mg every Mon, Thu; 5 mg all other days   Weekly warfarin total:   40 mg   Next INR check:   09/30/2017         Anticoagulation Episode Summary     Comments:             2. Weronika Birch acknowledged understanding of this plan      PATIENT INSTRUCTIONS    August 2019 Details    Sun Mon Tue Wed Thu Fri Sat         1               2               3                 4               5               6               7               8               9               10                 11               12               13               14               15               16      5  mg   See details      17      5 mg           18      5 mg         19      7.5 mg         20      5 mg         21      5 mg         22      7.5 mg         23      5 mg         24      5 mg           25      5 mg         26      7.5 mg         27      5 mg         28      5 mg         29      7.5 mg         30      5 mg  31      5 mg          Date Details   08/16  This INR check                 September 2019 Details    Sun Mon Tue Wed Thu Fri Sat     1      5 mg         2      7.5 mg         3      5 mg         4      5 mg         5      7.5 mg         6      5 mg         7      5 mg           8      5 mg         9      7.5 mg         10      5 mg         11      5 mg         12      7.5 mg         13      5 mg         14                 15               16               17               18               19               20               21                 22               23               24               25               26               27               28                 29               30                      Date Details   No additional details    Date of next INR:  09/30/2017

## 2017-09-02 NOTE — Progress Notes (Signed)
Patient c/o leg pain and was referred to PT , but apt is 2 weeks from now. Pt verbalizes frustration for apt being too far away and struggling to do ADLs since she lives alone. I advised pt to call insurance company and find a sooner apt. Pt appreciative.

## 2017-09-06 ENCOUNTER — Encounter (INDEPENDENT_AMBULATORY_CARE_PROVIDER_SITE_OTHER): Payer: Self-pay | Admitting: Family Medicine

## 2017-09-06 DIAGNOSIS — N950 Postmenopausal bleeding: Secondary | ICD-10-CM

## 2017-09-07 NOTE — Telephone Encounter (Signed)
Order signed.

## 2017-09-07 NOTE — Telephone Encounter (Signed)
Please see pt's response. Referral pended.

## 2017-09-07 NOTE — Telephone Encounter (Signed)
Pt informed via ecare. Closing

## 2017-09-08 ENCOUNTER — Telehealth (INDEPENDENT_AMBULATORY_CARE_PROVIDER_SITE_OTHER): Payer: Self-pay | Admitting: Family Medicine

## 2017-09-08 DIAGNOSIS — N950 Postmenopausal bleeding: Secondary | ICD-10-CM

## 2017-09-08 NOTE — Telephone Encounter (Signed)
Pt referred to OB/GYN yesterday by PCP.     Asked that we call back Monday and assist with scheduling once referral is processed.     Will postpone.

## 2017-09-12 ENCOUNTER — Ambulatory Visit (HOSPITAL_BASED_OUTPATIENT_CLINIC_OR_DEPARTMENT_OTHER): Payer: Medicare HMO

## 2017-09-12 ENCOUNTER — Ambulatory Visit (INDEPENDENT_AMBULATORY_CARE_PROVIDER_SITE_OTHER): Payer: Medicare HMO | Admitting: Orthopaedic Surgery

## 2017-09-12 ENCOUNTER — Encounter (INDEPENDENT_AMBULATORY_CARE_PROVIDER_SITE_OTHER): Payer: Self-pay | Admitting: Orthopaedic Surgery

## 2017-09-12 VITALS — BP 132/70 | HR 95 | Ht 66.5 in | Wt 272.0 lb

## 2017-09-12 DIAGNOSIS — M25561 Pain in right knee: Secondary | ICD-10-CM

## 2017-09-12 DIAGNOSIS — M25562 Pain in left knee: Secondary | ICD-10-CM

## 2017-09-12 DIAGNOSIS — M170 Bilateral primary osteoarthritis of knee: Secondary | ICD-10-CM | POA: Insufficient documentation

## 2017-09-12 DIAGNOSIS — Z6841 Body Mass Index (BMI) 40.0 and over, adult: Secondary | ICD-10-CM

## 2017-09-12 NOTE — Progress Notes (Signed)
NEW PATIENT NOTE    HISTORY OF PRESENT ILLNESS:  This is a 67 year old white female, referred from Dr. Velvet Bathe for evaluation of bilateral knee pain.  She has had symptoms for about 20 years of a moderate degree, but on 08/09/2017, she had more sharp pain and she had been exercising more for a week in the swimming pool and was attempting to continue weight reduction.  The next day she woke up, she could not bear weight on the left leg until later in the day she was able to limp.  She went to see her primary care physician a week later.  She had borrowed a walker in the meantime.  Her knees felt swollen.  Pain was primarily at the anterior and lateral aspect of the left knee.  She lost the ability to do stairs and had difficulty getting up and out of a chair.  Walking distance has diminished to less than a block.  Pain was 4-7 in intensity.    This patient has been morbidly obese for many years.  She has managed to lose 40 pounds in the last year.    PAST MEDICAL HISTORY:  Indicates she is followed for hypertension and elevated cholesterol and sleep apnea.  She has had a cardiac arrhythmia, atrial fibrillation for some time and has been on warfarin for this.  She is also on albuterol and ranitidine and triamterene/HCTZ.  She has had previous arthroscopic knee surgery on the right in 2000 and does not use tobacco.    ALLERGIES:  SHE HAS A SULFA ALLERGY.    PHYSICAL EXAMINATION:  VITAL SIGNS:  Blood pressure 132/70, pulse 95 regular, O2 sat 97%.  Height 5 feet 6 inches, weight 272 pounds, BMI of 43.34.  GENERAL APPEARANCE:  Morbidly obese, pleasant, middle-aged white female, oriented to place, time and person.  MUSCULOSKELETAL:  She has, on standing, loss of physiologic valgus of both knees, slight varus.  She walks with a bilateral limp, worse on the left.  She has severe medial joint line tenderness of the left knee, moderate tenderness in the lateral joint line on the left knee.  At the right knee, she had mild  medial and lateral joint line tenderness.  Range of motion of the right knee is 10 degrees to 100 degrees, 20 degrees to 80 degrees on the left.  She had intact ligamentous testing and marked patellofemoral pain and crepitus bilaterally.    X-RAYS:  Bilateral 4-view standing knee series shows far advanced severe osteoarthritis with bone-on-bone wear, medial compartment of both knees and bone-on-bone wear of the lateral patellofemoral facets bilaterally.    IMPRESSION:  Osteoarthritis of both knees, morbid obesity.    RECOMMENDATION:  We elected to inject her knees through an anterolateral approach after sterile prep and ethyl chloride spray analgesia, injecting each knee with 3 mL of 0.25% bupivacaine and 40 mg of Depo-Medrol.  She felt definitely better afterwards, could ambulate much better.    This patient has been losing weight.  She weighed 320 pounds over a year ago.  We urged her to continue efforts to lose weight, so that she could eventually be helped by total knee replacement.  She can have 3-4 knee injections a year.  We will see how she does with these.  She should follow up on recurrence of the pain.  She wants to try repeat injections.

## 2017-09-13 NOTE — Telephone Encounter (Signed)
Please change location to [facility].     Patient will reach out to them for scheduling.

## 2017-09-13 NOTE — Telephone Encounter (Signed)
Please let patient know the US shows no evidence of ovarian cancer.  Can switch referral to [facility] if they can get her in sooner.

## 2017-09-13 NOTE — Telephone Encounter (Signed)
Referral placed.

## 2017-09-13 NOTE — Telephone Encounter (Signed)
Referral currently in progress.     Sending to front staff to assist with scheduling OB/GYN appointment.

## 2017-09-13 NOTE — Telephone Encounter (Signed)
Next available is not until 10/4, offered to schedule and add to waitlist but patient is upset stating she might have ovarian cancer and should be prioritized    She is wondering if there are any other locations that Dr.El-attar could recommend where she could be seen urgently.     Routing to clinical pool

## 2017-09-14 ENCOUNTER — Other Ambulatory Visit
Admit: 2017-09-14 | Discharge: 2017-09-14 | Disposition: A | Discharge status: Home / Self Care | Payer: Medicare HMO | Attending: Gynecology | Admitting: Gynecology

## 2017-09-14 ENCOUNTER — Encounter (INDEPENDENT_AMBULATORY_CARE_PROVIDER_SITE_OTHER): Payer: Self-pay | Admitting: Gynecology

## 2017-09-14 ENCOUNTER — Ambulatory Visit (INDEPENDENT_AMBULATORY_CARE_PROVIDER_SITE_OTHER): Payer: Medicare HMO | Admitting: Gynecology

## 2017-09-14 VITALS — BP 136/82 | HR 111 | Ht 66.0 in | Wt 274.0 lb

## 2017-09-14 DIAGNOSIS — N950 Postmenopausal bleeding: Secondary | ICD-10-CM | POA: Insufficient documentation

## 2017-09-14 DIAGNOSIS — N841 Polyp of cervix uteri: Secondary | ICD-10-CM

## 2017-09-14 DIAGNOSIS — Z7901 Long term (current) use of anticoagulants: Secondary | ICD-10-CM

## 2017-09-14 DIAGNOSIS — Z6841 Body Mass Index (BMI) 40.0 and over, adult: Secondary | ICD-10-CM

## 2017-09-14 NOTE — Progress Notes (Signed)
NEW GYNECOLOGY VISIT    ID/CC: 67 year old G0P0000 female presents for a new gynecology visit for   Chief Complaint   Patient presents with    Post Menopausal Bleeding       HPI: Caitlin Wiley presents with post menopausal bleeding. She has had intermittent spotting since beginning warfarin in 2015. This was previously evaluated by Virgilio Belling and partial cervical polypectomy was performed. That provider mentioned possible hysteroscopy but they did not get to it.  More recently Caitlin Wiley passed a small clot with a firm area in it after getting out of the pool. This prompted an [place] which noted 43.5 cm cubed uterus with 7 mm endometrium and cervical polyp 6 mm.  Her INRs are generally stable. Last week she had cortisone injections in both her knees.           OB History     Gravida   0    Para   0    Term   0    Preterm   0    AB   0    Living   0       SAB   0    TAB   0    Ectopic   0    Multiple   0    Live Births   0                 Current Outpatient Medications   Medication Sig Dispense Refill    Acetaminophen (TYLENOL) 325 MG Oral Tab Take 325 mg by mouth every 6 hours as needed.      Albuterol Sulfate HFA (VENTOLIN HFA) 108 (90 Base) MCG/ACT Inhalation Aero Soln Inhale 1-2 puffs by mouth every 6 hours as needed for shortness of breath/wheezing. 18 g 3    Flaxseed, Linseed, (FLAXSEED OIL) 1200 MG Oral Cap None Entered      Fluticasone Propionate HFA (FLOVENT HFA) 110 MCG/ACT Inhalation Aerosol Inhale 2 puffs by mouth 2 times a day. - RINSE MOUTH AFTER USE 1 Inhaler 5    GLUCOSAMINE CHONDROITIN COMPLX OR       Magnesium 400 MG Oral Cap Take 400 mg by mouth.      Montelukast Sodium 10 MG Oral Tab Take 1 tablet (10 mg) by mouth daily. 90 tablet 3    Multiple Vitamins-Minerals (MULTIVITAMIN OR) no iron      Probiotic Product (PROBIOTIC DAILY OR)       raNITIdine HCl 150 MG Oral Tab Take 1 tablet (150 mg) by mouth 2 times a day. 180 tablet 3    triamterene-hydrochlorothiazide 37.5-25 MG Oral Tab Take 1  tablet by mouth daily. 90 tablet 3    warfarin (COUMADIN) 5 MG Oral Tab Take 1 tablet (5 mg) by mouth daily. 1 tablet daily or as directed 110 tablet 3     No current facility-administered medications for this visit.        Review of patient's allergies indicates:  Allergies   Allergen Reactions    Bee Venom Anaphylaxis    Ciprofloxacin Other     Joint aching    Pneumococcal Vaccines Hives    Sulfa Antibiotics Hives    Cats [Animals] Itching    Dust Mite Extract Itching     DUST    Pollen Extract Itching       Patient Active Problem List    Diagnosis Date Noted    Primary osteoarthritis of both knees [M17.0] 09/12/2017    Pain in  both knees [M25.561, M25.562] 09/12/2017    Chronic anticoagulation [Z79.01] 08/02/2015    Right cervical radiculopathy [M54.12] 05/30/2015    Obesity [E66.9] 05/30/2015    Paroxysmal atrial fibrillation ([place]) [I48.0] 04/17/2014     A. S/p ablation ([facility] PVI) 10/19/2016      GERD (gastroesophageal reflux disease) [K21.9] 11/08/2012    OA (osteoarthritis) [M19.90] 07/28/2012    Asthma [J45.909] 07/24/2012    Hypercholesterolemia [E78.00] 07/24/2012    Edema [R60.9] 07/24/2012    Sleep apnea [G47.30] 07/24/2012    HTN (hypertension) [I10] 07/24/2012    Pulmonary nodule [R91.1] 07/24/2012    Grief reaction [F43.21] 07/24/2012     05/11/2011         Past Surgical History:   Procedure Laterality Date    COLONOSCOPY FLX DX W/COLLJ SPEC WHEN PFRMD  11/2005    SURGICAL HX OTHER  08/1999    knee surgery    UNLISTED PROCEDURE FEMUR/KNEE      UNLISTED PROCEDURE NECK/THORAX         Family History     Problem (# of Occurrences) Relation (Name,Age of Onset)    Anesthesia Problems (1) Mother    Arthritis (3) Mother, Father, Other: grandmother    Asthma (1) Other: and/or allergies - grandfather    Breast Cancer (2) Mother: & tongue cancer, Other: grandmother    Cancer (2) Father, Other: grandparents    Colon Polyps (1) Father    Diabetes (1) Father    Heart Disease (3) Father: torn  valve 01/1999, Maternal Grandfather, Paternal Grandfather    Hypertension (2) Mother, Father    Lupus (1) Aunt/Uncle: great-aunt    Mental Illness (2) Mother, Brother    Stroke (2) Mother: possible, Father    Tuberculosis (1) Other: great-grandmother          Social History     Tobacco Use    Smoking status: Never Smoker    Smokeless tobacco: Never Used   Substance Use Topics    Alcohol use: No     Alcohol/week: 0.0 standard drinks     Comment: rare        Immunization History   Administered Date(s) Administered    Influenza 10/18/2015, 11/18/2016    Tdap vaccine 03/13/2010    pneumococcal (Prevnar 13) conjugate vaccine 08/20/2016            ROS:   Constitutional: Positive for weight loss with intention and night sweats/ hot flashes   Eyes: Negative    Ears, Nose, Mouth, Throat: Negative    Cardiovascular: Negative    Respiratory: Positive for asthma.   Gastrointestinal: Positive for constipation and gas.   Genitourinary: As noted in HPI above   Musculoskeletal: Positive for arthritis.   Skin: Positive for eczema.   Neurological: Negative    Psychiatric: positive for insomnia   Endocrine: Negative    Hematologic/Lymphatic: Negative   Allergic/Immunologic: Negative       Physical Exam:   BP 136/82    Pulse (!) 111    Ht 5\' 6"  (1.676 m)    Wt (!) 274 lb (124.3 kg)    LMP  (LMP Unknown)    SpO2 96%    BMI 44.22 kg/m   Gen: well appearing, NAD, ambulates with walker/ chair.  Genitourinary: normal external female genitalia, normal bartholins, skenes, urethral meatus, anus.  No hemorrhoids. Vaginal mucosa pink, rugated, no vaginal wall lesions. Cervix  nulliparous without lesions, high in vault ( longest pederson needed to visualize).  Impression: 67 year old G0P0000 female presents for   Chief Complaint   Patient presents with    Post Menopausal Bleeding     1. Post-menopausal bleeding  Discussed tissue sampling methods- see below  - ENDOMETRIAL BX W/WO ENDOCERVIX BX W/O DILAT SPX  - PATHOLOGY, SURGICAL    2.  Chronic anticoagulation    3. Cervical polyp  The lesion is not visible at external os- best approached hysteroscopically. Discussed procedure technique, outpatient status. She lives alone- will have issues with rides/ support at home.  While it is most likely the lesion is benign it will likely remain a source of potential bleeding. Can consider intermittent serial [place] ( such as annually) if no symptom change and office sampling is benign.         Caitlin Wiley was seen today for post menopausal bleeding.    Diagnoses and all orders for this visit:    Post-menopausal bleeding  -     ENDOMETRIAL BX W/WO ENDOCERVIX BX W/O DILAT SPX  -     PATHOLOGY, SURGICAL    Chronic anticoagulation    Cervical polyp      In addition to procedure today a total of 30 minutes were spent face to face with this patient today of which over 50% counseling and coordinating care regarding diagnoses listed.      Levora Angel, MD    ENDOMETRIAL BIOPSY    Caitlin Wiley is a 67 year old [place] who presents today for endometrial biopsy.  INDICATION: postmenopausal bleeding, abnormal ultrasound: 7 mm endometrium    No LMP recorded (lmp unknown). Patient is postmenopausal.  Urine pregnancy test was not done.    The risks (including infection, bleeding, pain, and uterine perforation) and benefits of the procedure were explained to the patient: YES  Treatment options reviewed: YES      PROCEDURE NOTE     Correct patient NAME and ID YES   Correct PROCEDURE YES   Correct EQUIPMENT and SETTINGS YES   Completed CONSENT YES, Date: today verbal     The patient was placed in the dorsal lithotomy position.  [place] images reviewed.  A speculum was inserted in the vagina, and the cervix prepped with povidone iodine.  Endocervical curettage with a Kevorkian curette was not performed.    A tenaculum was applied to the anterior lip of the cervix for stabilization. The uterus was sounded to a depth of 7 cm ( with pipelle device).  A Pipelle endometrial  aspirator was used to sample the endometrium.  Sample was sent for pathologic examination.    The patient tolerated the procedure well. Hemostasis was noted.  Complications: none    The patient was advised to call for any fever or for prolonged or severe pain or bleeding. She was advised to use OTC acetaminophen as needed for mild to moderate pain.d.    Procedure performed by Attending physician

## 2017-09-20 LAB — PATHOLOGY, SURGICAL

## 2017-09-21 ENCOUNTER — Encounter (INDEPENDENT_AMBULATORY_CARE_PROVIDER_SITE_OTHER): Payer: Self-pay | Admitting: Gynecology

## 2017-09-21 MED ORDER — BUPIVACAINE HCL (PF) 0.5 % IJ SOLN
3.0000 mL | Freq: Once | INTRAMUSCULAR | Status: AC
Start: 2017-09-21 — End: 2017-09-12
  Administered 2017-09-12: 3 mL

## 2017-09-21 MED ORDER — METHYLPREDNISOLONE ACETATE 40 MG/ML IJ SUSP
40.0000 mg | Freq: Once | INTRAMUSCULAR | Status: AC
Start: 2017-09-21 — End: 2017-09-12
  Administered 2017-09-12: 40 mg via INTRA_ARTICULAR

## 2017-10-03 ENCOUNTER — Ambulatory Visit: Payer: Medicare HMO | Attending: Internal Medicine | Admitting: Internal Medicine

## 2017-10-03 ENCOUNTER — Ambulatory Visit (HOSPITAL_BASED_OUTPATIENT_CLINIC_OR_DEPARTMENT_OTHER): Payer: Medicare HMO

## 2017-10-03 VITALS — BP 112/70 | HR 89 | Ht 66.0 in | Wt 272.0 lb

## 2017-10-03 DIAGNOSIS — E7800 Pure hypercholesterolemia, unspecified: Secondary | ICD-10-CM | POA: Insufficient documentation

## 2017-10-03 DIAGNOSIS — I10 Essential (primary) hypertension: Secondary | ICD-10-CM | POA: Insufficient documentation

## 2017-10-03 DIAGNOSIS — Z6841 Body Mass Index (BMI) 40.0 and over, adult: Secondary | ICD-10-CM

## 2017-10-03 DIAGNOSIS — I480 Paroxysmal atrial fibrillation: Secondary | ICD-10-CM

## 2017-10-03 DIAGNOSIS — Z7901 Long term (current) use of anticoagulants: Secondary | ICD-10-CM | POA: Insufficient documentation

## 2017-10-03 LAB — PR PROTHROMBIN TIME, ONSITE: prothrombin INR: 1.8

## 2017-10-03 NOTE — Progress Notes (Signed)
30 Day ECAT Monitor    Ordering/Referring:  Dr. Dorothyann Peng    Reading/Interp: Dr. Dorothyann Peng    Dx: Atrial Fibrillation     Hookup Tech: kde    Start time: 1100    Duration: 30 Days    Special Instructions:     Explained to patient how to wear and utilize the function of the monitor. The patient understood not to get the monitor wet and to mail the monitor and all associated equipment back to [facility].. Patient verbally acknowledged the instructions and had no further questions and left with all needed equipment for the monitoring period.        Caitlin Wiley. [place]

## 2017-10-03 NOTE — Patient Instructions (Signed)
Wear monitor for 30 days.  Return after.

## 2017-10-03 NOTE — Progress Notes (Signed)
Agree 

## 2017-10-03 NOTE — Progress Notes (Signed)
DATE:  10/03/2017    NAME: Caitlin Wiley  DOB:  [birth year]    Referring Provider:  No ref. provider found  Primary Care Provider:  El-Attar, Hanley Seamen, MD    Caitlin Wiley is an 67 year old female who comes in for follow-up of paroxysmal atrial fibrillation.  The patient is status post pulmonary vein isolation in October of last year.  She's had no further symptomatic episodes of atrial fibrillation and denies palpitations.  She denies pain, pressure or heaviness in her chest.  She denies dyspnea on exertion, but has a very limited functional status due to her weight and arthritis.  She denies paroxysmal nocturnal dyspnea or orthopnea.  She is not been lightheaded or presyncopal.  She is tolerating her current medical regimen well.      The patient has gone over the review of systems form today, and other than what is noted above, notes joint pain.     MEDICATION ALLERGIES AND INTOLERANCES:  Bee venom; Ciprofloxacin; Pneumococcal vaccines; Sulfa antibiotics; Cats [animals]; Dust mite extract; and Pollen extract    MEDICATIONS:  Current Outpatient Medications   Medication Sig Dispense Refill    Acetaminophen (TYLENOL) 325 MG Oral Tab Take 325 mg by mouth every 6 hours as needed.      Albuterol Sulfate HFA (VENTOLIN HFA) 108 (90 Base) MCG/ACT Inhalation Aero Soln Inhale 1-2 puffs by mouth every 6 hours as needed for shortness of breath/wheezing. 18 g 3    Cholecalciferol (VITAMIN D3 OR) Take by mouth.      Flaxseed, Linseed, (FLAXSEED OIL) 1200 MG Oral Cap None Entered      Fluticasone Propionate HFA (FLOVENT HFA) 110 MCG/ACT Inhalation Aerosol Inhale 2 puffs by mouth 2 times a day. - RINSE MOUTH AFTER USE (Patient not taking: Reported on 10/03/2017) 1 Inhaler 5    GLUCOSAMINE CHONDROITIN COMPLX OR       Magnesium 400 MG Oral Cap Take 400 mg by mouth.      Montelukast Sodium 10 MG Oral Tab Take 1 tablet (10 mg) by mouth daily. 90 tablet 3    Multiple Vitamins-Minerals (MULTIVITAMIN OR) no iron       Probiotic Product (PROBIOTIC DAILY OR)       raNITIdine HCl 150 MG Oral Tab Take 1 tablet (150 mg) by mouth 2 times a day. 180 tablet 3    triamterene-hydrochlorothiazide 37.5-25 MG Oral Tab Take 1 tablet by mouth daily. 90 tablet 3    Unclassified (OTHER MEDS, SEE COMMENTS,) Curamin oral tab daily      warfarin (COUMADIN) 5 MG Oral Tab Take 1 tablet (5 mg) by mouth daily. 1 tablet daily or as directed 110 tablet 3     No current facility-administered medications for this visit.        PHYSICAL EXAMINATION reveals a Constitutional: well-appearing female, who is oriented x 3 and expresses a normal affect.  Vitals:  BP 112/70    Pulse 89    Ht 5\' 6"  (1.676 m)    Wt (!) 272 lb (123.4 kg)    LMP  (LMP Unknown)    SpO2 95%    BMI 43.90 kg/m .  Conjunctivae are clear.  Perioral mucosa is with no cyanosis.  Jugular veins are flat.  Respirations are nonlabored and lungs are clear to auscultation.  Cardiac exam shows no lifts or thrills.  Rhythm is regular, with no murmurs, rubs or gallops.  Carotid upstrokes are normal with no bruits.  Abdomen is with no palpable  hepatomegaly or tenderness.  The abdominal aorta is without bruits or palpable enlargement.  Upper extremity digits show no cyanosis.  There is no lower extremity edema.  Pedal pulses are 2+ bilaterally.  Neuro: Neuro: gait is Labored.  Skin: Skin: no lesions noted.    The patient's EKG shows normal sinus rhythm with calcific ST-T and T-wave abnormality    IMPRESSION AND PLAN:  (I48.0) Paroxysmal atrial fibrillation (HCC)  (primary encounter diagnosis)  Plan: EKG 12-LEAD, REFERRAL TO CARDIAC MONITORING        The patient is stable status post ablation.  She is one year post procedure is had no further documented episodes of atrial fibrillation.  She is asymptomatic.  Question is whether the patient needs to continue on anticoagulation.  I have recommended that she be fitted with a 30 day event monitor to assess whether she is having a symptomatic recurrences.   This is negative, I would consider discontinuing warfarin at her next appointment.    (Z79.01) Chronic anticoagulation  Plan: As above.    (E78.00) Hypercholesterolemia  Plan: The patient does not require lipid-lowering therapy.    (I10) Essential hypertension  Plan: The patient's blood pressure is adequately controlled.    I will see the patient back in follow-up in one month.          PROBLEM LIST:  Patient Active Problem List    Diagnosis Date Noted    Primary osteoarthritis of both knees 09/12/2017    Pain in both knees 09/12/2017    Chronic anticoagulation 08/02/2015    Right cervical radiculopathy 05/30/2015    Obesity 05/30/2015    Paroxysmal atrial fibrillation ([place]) 04/17/2014     A. S/p ablation ([facility] PVI) 10/19/2016      GERD (gastroesophageal reflux disease) 11/08/2012    OA (osteoarthritis) 07/28/2012    Asthma 07/24/2012    Hypercholesterolemia 07/24/2012    Edema 07/24/2012    Sleep apnea 07/24/2012    HTN (hypertension) 07/24/2012    Pulmonary nodule 07/24/2012    Grief reaction 07/24/2012     05/11/2011         The patient has a past medical history of Anxiety, Cervical disc disease, GERD (gastroesophageal reflux disease), Leukocytosis, Muscle cramps (04/2011), Osteoarthritis, and Pain of toe of right foot (02/2010).  Past Surgical History:   Procedure Laterality Date    COLONOSCOPY FLX DX W/COLLJ SPEC WHEN PFRMD  11/2005    SURGICAL HX OTHER  08/1999    knee surgery    UNLISTED PROCEDURE FEMUR/KNEE      UNLISTED PROCEDURE NECK/THORAX         SOCIAL HISTORY:  Social History     Socioeconomic History    Marital status: Widowed     Spouse name: Not on file    Number of children: 0    Years of education: Not on file    Highest education level: Not on file   Occupational History    Not on file   Social Needs    Financial resource strain: Not on file    Food insecurity:     Worry: Not on file     Inability: Not on file    Transportation needs:     Medical: Not on file      Non-medical: Not on file   Tobacco Use    Smoking status: Never Smoker    Smokeless tobacco: Never Used   Substance and Sexual Activity    Alcohol use: No     Alcohol/week:  0.0 standard drinks     Comment: rare     Drug use: No    Sexual activity: Not Currently   Lifestyle    Physical activity:     Days per week: Not on file     Minutes per session: Not on file    Stress: Not on file   Relationships    Social connections:     Talks on phone: Not on file     Gets together: Not on file     Attends religious service: Not on file     Active member of club or organization: Not on file     Attends meetings of clubs or organizations: Not on file     Relationship status: Not on file    Intimate partner violence:     Fear of current or ex partner: Not on file     Emotionally abused: Not on file     Physically abused: Not on file     Forced sexual activity: Not on file   Other Topics Concern    Not on file   Social History Narrative    Widow  Technical sales engineer  Rare ETOH  Former Smoker No social drug use. Rare exercise.         DXA completed on 07/31/12, resulting in normal bone density of the lumbar spine, and normal bone density at the L hip; ordered 04/30/15        Mammogram performed on 05/27/14, resulting in no mammographic features of malignancy         Colonoscopy performed on 04/03/14 with Dr. Estill Bamberg, resulting in moderate diverticulosis of the left side of the colon, long redundant colon, cleared for seven years         TDAP- 03/13/10    Zostavax-advised 04/30/15        PAP-monitored by Dr. Harmon Pier        HIV screening, Hepatitis C screening-done in 2014       FAMILY HISTORY:  Family History     Problem (# of Occurrences) Relation (Name,Age of Onset)    Anesthesia Problems (1) Mother    Arthritis (3) Mother, Father, Other: grandmother    Asthma (1) Other: and/or allergies - grandfather    Breast Cancer (2) Mother: & tongue cancer, Other: grandmother    Cancer (2) Father, Other: grandparents    Colon Polyps (1) Father     Diabetes (1) Father    Heart Disease (3) Father: torn valve 01/1999, Maternal Grandfather, Paternal Grandfather    Hypertension (2) Mother, Father    Lupus (1) Aunt/Uncle: great-aunt    Mental Illness (2) Mother, Brother    Stroke (2) Mother: possible, Father    Tuberculosis (1) Other: great-grandmother          Electronically signed by Marquis Buggy, MD, [facility]  Please note:  This document was created using speech recognition technology.    cc: Jerald Kief, MD  [address] [place] [address]  Patient Care Team:  El-Attar, Hanley Seamen, MD as PCP - General (Family Practice)  Ivan Croft, Katherine Basset, MD (Cardiology)  Ronalee Red, RD as Dietitian (Dietitian/Nutritionist)  Dolack, Marcine Matar, MD as Consulting Physician (Internal Medicine)

## 2017-10-03 NOTE — Progress Notes (Signed)
Anticoag Treatment Plan  As of 10/03/2017    INR goal:   2.0-3.0   TTR:   83.2 % (4 y)   INR used for dosing:      Next INR check:   09/30/2017   Target end date:       Indications    Paroxysmal atrial fibrillation ([place]) [I48.0]             Anticoagulation Episode Summary     INR check location:       Send INR reminders to:   [facility] EDM CLINICAL SUPPORT STAFF POOL    Comments:               SUBJECTIVE        OBJECTIVE    [place] Visit Date 09/02/2017 08/23/2017 07/27/2017 07/06/2017 06/22/2017 06/08/2017 05/18/2017   INR Goal 2.0-3.0 2.0-3.0 2.0-3.0 2.0-3.0 2.0-3.0 2.0-3.0 2.0-3.0   Selected INR 2.1 1.9 2.2 2.1 1.9 1.8 2.3   INR Date 09/02/2017 08/23/2017 07/27/2017 07/06/2017 06/22/2017 06/08/2017 05/18/2017   Previous Week Total 40 mg 40 mg 40 mg 40 mg 37.5 mg 35 mg 35 mg   Pt Deviation No No No No No No No   Pt Findings Comments Patient using walker now due to left leg pain, referred to physical therapy.  Pt supposed to be having endometrial biopsy, not scheduled yet. Has been taking warfarin 7.5 mg MWF and 5 mg all other days. Pt to continue this dose and return for INR in   1 month.  Patient forgot and took 5 mg instead of 7.5 mg yesterday. She continues to eat greens daily and has been consistent with diet. No other findings.  - Is eating Kale salad less frequently. - Eating more greens.  One fading bruise on left big toe.   Next Week Total 40 mg 40 mg 40 mg 40 mg 40 mg 37.5 mg 35 mg   Full Instructions 7.5 mg every Mon, Thu; 5 mg all other days 7.5 mg every Mon, Thu; 5 mg all other days 7.5 mg every Mon, Thu; 5 mg all other days 7.5 mg every Mon, Thu; 5 mg all other days 7.5 mg every Wed, Fri; 5 mg all other days 7.5 mg every Wed; 5 mg all other days 5 mg every day   Date of Next INR 09/30/2017 09/02/2017 08/27/2017 07/27/2017 07/06/2017 06/22/2017 06/08/2017       ASSESSMENT  INR 1.8   Subtherapeutic INR without apparent cause and without complications.    Patient reports she may have missed half a dose since she found half a pill on the  floor. Pt received cortisone injection in her knee last week. Patient also using biofreeze cream for musculoskeletal pain at times. INRs have been subtherapeutic in past, plan to increase warfarin.   Reviewed per MD protocol.      PLAN  1.  Increase Warfarin to 7.5 x4 days (M, W, F, Sat) and 5 mg all other days. Recheck in 1 week.   .  As of 10/03/2017    Next INR check:   09/30/2017         Anticoagulation Episode Summary     Comments:             2. Aeriana Speece acknowledged understanding of this plan      PATIENT INSTRUCTIONS

## 2017-10-04 ENCOUNTER — Encounter (INDEPENDENT_AMBULATORY_CARE_PROVIDER_SITE_OTHER): Payer: Self-pay | Admitting: Family Medicine

## 2017-10-04 DIAGNOSIS — M25561 Pain in right knee: Secondary | ICD-10-CM

## 2017-10-04 LAB — EKG 12 LEAD
Atrial Rate: 79 {beats}/min
Diagnosis: NORMAL
P Axis: 65 degrees
P-R Interval: 204 ms
Q-T Interval: 374 ms
QRS Duration: 102 ms
QTC Calculation: 428 ms
R Axis: 88 degrees
T Axis: 6 degrees
Ventricular Rate: 79 {beats}/min

## 2017-10-05 NOTE — Telephone Encounter (Signed)
Pt informed referral has been signed       Closing TE

## 2017-10-05 NOTE — Telephone Encounter (Signed)
Please see ecare message from pt, thank you.       Referral pended for right knee pain if you agree

## 2017-10-05 NOTE — Telephone Encounter (Signed)
Order signed.

## 2017-10-11 ENCOUNTER — Ambulatory Visit: Payer: Medicare HMO | Attending: Internal Medicine

## 2017-10-11 DIAGNOSIS — I480 Paroxysmal atrial fibrillation: Secondary | ICD-10-CM | POA: Insufficient documentation

## 2017-10-11 LAB — PR PROTHROMBIN TIME, ONSITE: prothrombin INR: 2.1

## 2017-10-11 NOTE — Progress Notes (Signed)
Anticoag Treatment Plan  As of 10/11/2017    INR goal:   2.0-3.0   TTR:   81.9 % (4.1 y)   INR used for dosing:   2.1 (10/11/2017)   Full warfarin instructions:   7.5 mg every Mon, Thu, Sat; 5 mg all other days   Weekly warfarin total:   42.5 mg   Next INR check:   10/25/2017   Target end date:       Indications    Paroxysmal atrial fibrillation ([place]) [I48.0]             Anticoagulation Episode Summary     INR check location:       Send INR reminders to:   [facility] EDM CLINICAL SUPPORT STAFF POOL    Comments:               SUBJECTIVE    Patient Findings     Negatives:   Signs/symptoms of thrombosis, Signs/symptoms of bleeding, Laboratory test error suspected, Change in health, Change in alcohol use, Change in activity, Upcoming invasive procedure, Emergency department visit, Upcoming dental procedure, Missed doses, Extra doses, Change in medications, Change in diet/appetite, Hospital admission, Bruising, Other complaints          OBJECTIVE    Anticoag Visit Date 10/11/2017 10/03/2017 09/02/2017 08/23/2017 07/27/2017 07/06/2017 06/22/2017   INR Goal 2.0-3.0 2.0-3.0 2.0-3.0 2.0-3.0 2.0-3.0 2.0-3.0 2.0-3.0   Selected INR 2.1 1.8 2.1 1.9 2.2 2.1 1.9   INR Date 10/11/2017 10/03/2017 09/02/2017 08/23/2017 07/27/2017 07/06/2017 06/22/2017   Previous Week Total 42.5 mg 40 mg 40 mg 40 mg 40 mg 40 mg 37.5 mg   Pt Deviation No No No No No No No   Pt Findings Comments - Patient reports she may have missed half a dose since she found half a pill on the floor. Pt received cortisone injection in her knee last week. Patient also using biofreeze cream for musculoskeletal pain at times.  Patient using walker now due to left leg pain, referred to physical therapy.  Pt supposed to be having endometrial biopsy, not scheduled yet. Has been taking warfarin 7.5 mg MWF and 5 mg all other days. Pt to continue this dose and return for INR in   1 month.  Patient forgot and took 5 mg instead of 7.5 mg yesterday. She continues to eat greens daily and has been consistent  with diet. No other findings.  - Is eating Kale salad less frequently. -   Next Week Total 42.5 mg 42.5 mg 40 mg 40 mg 40 mg 40 mg 40 mg   Full Instructions 7.5 mg every Mon, Thu, Sat; 5 mg all other days 7.5 mg every Mon, Thu, Sat; 5 mg all other days 7.5 mg every Mon, Thu; 5 mg all other days 7.5 mg every Mon, Thu; 5 mg all other days 7.5 mg every Mon, Thu; 5 mg all other days 7.5 mg every Mon, Thu; 5 mg all other days 7.5 mg every Wed, Fri; 5 mg all other days   Date of Next INR 10/25/2017 10/10/2017 09/30/2017 09/02/2017 08/27/2017 07/27/2017 07/06/2017       ASSESSMENT    Therapeutic INR in stable patient and without complications.     PLAN    INR 1.8. Continue Warfarin dosing of 7.5 mg x 4 (M, W, F, Sat) and 5 mg all other days. Plan for pt to return to clinic for RN visit and INR check in two weeks. Pt verbalizes understanding and agreement with plans.  [name]  As of 10/11/2017    Warfarin maintenance plan:   7.5 mg every Mon, Thu, Sat; 5 mg all other days   Full warfarin instructions:   7.5 mg every Mon, Thu, Sat; 5 mg all other days   Weekly warfarin total:   42.5 mg   No change documented:   Candie Mile, RN   Next INR check:   10/25/2017         Anticoagulation Episode Summary     Comments:                 PATIENT INSTRUCTIONS    September 2019 Details    Sun Glori Luis Thu Fri Sat     1               2               3               4               5               6               7                 8               9               10               11               12               13               14                 15               16               17               18               19               20               21                 22               23               24      5  mg   See details      25      5 mg         26      7.5 mg         27      5 mg         28      7.5 mg           29      5 mg         30      7.5 mg               Date Details   09/24 This  INR check              October 2019 Details    Sun Molli Knock  Tue Wed Thu Fri Sat       1      5 mg         2      5 mg         3      7.5 mg         4      5 mg         5      7.5 mg           6      5 mg         7      7.5 mg         8      5 mg         9               10               11               12                 13               14               15               16               17               18               19                 20               21               22               23               24               25               26                 27               28               29               30               31                   Date Details   No additional details    Date of next INR:  10/25/2017

## 2017-10-13 NOTE — Telephone Encounter (Signed)
Referral faxed to # provided by pt     Pt informed     Closing TE

## 2017-11-03 ENCOUNTER — Ambulatory Visit: Payer: Medicare HMO | Attending: Internal Medicine | Admitting: Internal Medicine

## 2017-11-03 ENCOUNTER — Ambulatory Visit (HOSPITAL_BASED_OUTPATIENT_CLINIC_OR_DEPARTMENT_OTHER): Payer: Medicare HMO | Admitting: Unknown Physician Specialty

## 2017-11-03 VITALS — BP 142/80 | HR 98 | Ht 66.0 in | Wt 267.0 lb

## 2017-11-03 DIAGNOSIS — I480 Paroxysmal atrial fibrillation: Secondary | ICD-10-CM | POA: Insufficient documentation

## 2017-11-03 DIAGNOSIS — Z7901 Long term (current) use of anticoagulants: Secondary | ICD-10-CM | POA: Insufficient documentation

## 2017-11-03 DIAGNOSIS — Z5181 Encounter for therapeutic drug level monitoring: Secondary | ICD-10-CM

## 2017-11-03 DIAGNOSIS — Z6841 Body Mass Index (BMI) 40.0 and over, adult: Secondary | ICD-10-CM

## 2017-11-03 DIAGNOSIS — I10 Essential (primary) hypertension: Secondary | ICD-10-CM

## 2017-11-03 LAB — PR PROTHROMBIN TIME, ONSITE: prothrombin INR: 1.9

## 2017-11-03 NOTE — Progress Notes (Signed)
Anticoag Treatment Plan  As of 11/03/2017    INR goal:   2.0-3.0   TTR:   81.4 % (4.2 y)   INR used for dosing:   1.9! (11/03/2017)   Full warfarin instructions:   5 mg every Sun, Tue, Thu; 7.5 mg all other days   Weekly warfarin total:   45 mg   Next INR check:   11/17/2017   Target end date:       Indications    Paroxysmal atrial fibrillation ([place]) [I48.0]             Anticoagulation Episode Summary     INR check location:       Send INR reminders to:   [facility] EDM CLINICAL SUPPORT STAFF POOL    Comments:               SUBJECTIVE    Patient Findings     Positives:   Missed doses    Negatives:   Signs/symptoms of thrombosis, Signs/symptoms of bleeding, Laboratory test error suspected, Change in health, Change in alcohol use, Change in activity, Upcoming invasive procedure, Emergency department visit, Upcoming dental procedure, Extra doses, Change in medications, Change in diet/appetite, Hospital admission, Bruising, Other complaints    Comments:   Took 5 mg instead of 7.5 mg one day this week.           OBJECTIVE    Anticoag Visit Date 11/03/2017 10/11/2017 10/03/2017 09/02/2017 08/23/2017 07/27/2017 07/06/2017   INR Goal 2.0-3.0 2.0-3.0 2.0-3.0 2.0-3.0 2.0-3.0 2.0-3.0 2.0-3.0   Selected INR 1.9 2.1 1.8 2.1 1.9 2.2 2.1   INR Date 11/03/2017 10/11/2017 10/03/2017 09/02/2017 08/23/2017 07/27/2017 07/06/2017   Previous Week Total 42.5 mg 42.5 mg 40 mg 40 mg 40 mg 40 mg 40 mg   Pt Deviation No No No No No No No   Pt Findings Comments Took 5 mg instead of 7.5 mg one day this week.  - Patient reports she may have missed half a dose since she found half a pill on the floor. Pt received cortisone injection in her knee last week. Patient also using biofreeze cream for musculoskeletal pain at times.  Patient using walker now due to left leg pain, referred to physical therapy.  Pt supposed to be having endometrial biopsy, not scheduled yet. Has been taking warfarin 7.5 mg MWF and 5 mg all other days. Pt to continue this dose and return for  INR in   1 month.  Patient forgot and took 5 mg instead of 7.5 mg yesterday. She continues to eat greens daily and has been consistent with diet. No other findings.  - Is eating Kale salad less frequently.   Next Week Total 45 mg 42.5 mg 42.5 mg 40 mg 40 mg 40 mg 40 mg   Full Instructions 5 mg every Sun, Tue, Thu; 7.5 mg all other days 7.5 mg every Mon, Thu, Sat; 5 mg all other days 7.5 mg every Mon, Thu, Sat; 5 mg all other days 7.5 mg every Mon, Thu; 5 mg all other days 7.5 mg every Mon, Thu; 5 mg all other days 7.5 mg every Mon, Thu; 5 mg all other days 7.5 mg every Mon, Thu; 5 mg all other days   Date of Next INR 11/17/2017 10/25/2017 10/10/2017 09/30/2017 09/02/2017 08/27/2017 07/27/2017       ASSESSMENT    Subtherapeutic INR in association with taking 5 mg instead of 7.5 mg x 1 this week  and without complications.  PLAN    1.   Continue Warfarin 7.5 mg x 4 days a week and 5 mg x 3 days a week (T/Th/Sun). Re check INR check in two weeks.  Reviewed per MD protocol.        .  As of 11/03/2017    Warfarin maintenance plan:   5 mg every Sun, Tue, Thu; 7.5 mg all other days   Full warfarin instructions:   5 mg every Sun, Tue, Thu; 7.5 mg all other days   Weekly warfarin total:   45 mg   Next INR check:   11/17/2017         Anticoagulation Episode Summary     Comments:             2. Caitlin Wiley acknowledged understanding of this plan      PATIENT INSTRUCTIONS    October 2019 Details    Sun Mon Tue Wed Thu Fri Sat       1               2               3               4               5                 6               7               8               9               10               11               12                 13               14               15               16               17      5  mg   See details      18      7.5 mg         19      7.5 mg           20      5 mg         21      7.5 mg         22      5 mg         23      7.5 mg         24      5 mg         25      7.5 mg         26      7.5 mg            27      5 mg         28  7.5 mg         29      5 mg         30      7.5 mg         31      5 mg            Date Details   10/17 This INR check       Date of next INR:  11/17/2017

## 2017-11-03 NOTE — Progress Notes (Signed)
DATE:  11/03/2017    NAME: Caitlin Wiley  DOB:  [birth year]    Referring Provider:  No ref. provider found  Primary Care Provider:  El-Attar, Hanley Seamen, MD    Caitlin Wiley is an 67 year old female who comes in for follow-up of paroxysmal atrial fibrillation.  The patient is status post pulmonary vein isolation.  She is unaware of any symptomatically recurrences of her atrial fibrillation.  I asked her to wear a 30 day event monitor to assess whether she is having asymptomatic recurrences before recommending that she discontinue anticoagulation.  She now presents for follow-up of that.  She again is unaware of her heart racing, skipping, pounding or beating irregularly.  She denies pain, pressure or heaviness in her chest.  She denies dyspnea on exertion, but has a very limited functional status because of her arthritis.  She denies paroxysmal nocturnal dyspnea or orthopnea.  She is not been lightheaded or presyncopal.  She is tolerating her current medical regimen well.  She wishes to discontinue anticoagulation because of her arthritis and wishes to be able to take nonsteroidal anti-inflammatory medications.    The patient has gone over the review of systems form today, and other than what is noted above, notes weight loss, shoulder pain, diarrhea, joint pains, muscle aches, easy bruising and depression.     MEDICATION ALLERGIES AND INTOLERANCES:  Bee venom; Ciprofloxacin; Pneumococcal vaccines; Sulfa antibiotics; Cats [animals]; Dust mite extract; and Pollen extract    MEDICATIONS:  Current Outpatient Medications   Medication Sig Dispense Refill    Acetaminophen (TYLENOL) 325 MG Oral Tab Take 325 mg by mouth every 6 hours as needed.      Albuterol Sulfate HFA (VENTOLIN HFA) 108 (90 Base) MCG/ACT Inhalation Aero Soln Inhale 1-2 puffs by mouth every 6 hours as needed for shortness of breath/wheezing. 18 g 3    Cholecalciferol (VITAMIN D3 OR) Take by mouth.      Flaxseed, Linseed, (FLAXSEED OIL) 1200  MG Oral Cap None Entered      GLUCOSAMINE CHONDROITIN COMPLX OR       Magnesium 400 MG Oral Cap Take 400 mg by mouth.      Montelukast Sodium 10 MG Oral Tab Take 1 tablet (10 mg) by mouth daily. 90 tablet 3    Multiple Vitamins-Minerals (MULTIVITAMIN OR) no iron      Probiotic Product (PROBIOTIC DAILY OR)       raNITIdine HCl 150 MG Oral Tab Take 1 tablet (150 mg) by mouth 2 times a day. 180 tablet 3    triamterene-hydrochlorothiazide 37.5-25 MG Oral Tab Take 1 tablet by mouth daily. 90 tablet 3    Unclassified (OTHER MEDS, SEE COMMENTS,) Curamin oral tab daily      warfarin (COUMADIN) 5 MG Oral Tab Take 1 tablet (5 mg) by mouth daily. 1 tablet daily or as directed 110 tablet 3     No current facility-administered medications for this visit.        PHYSICAL EXAMINATION reveals a Constitutional: well-appearing female, who is oriented x 3 and expresses a normal affect.  Vitals:  BP (!) 142/80    Pulse 98    Ht 5\' 6"  (1.676 m)    Wt (!) 267 lb (121.1 kg)    LMP  (LMP Unknown)    SpO2 99%    BMI 43.09 kg/m .  Conjunctivae are clear to myself.  Perioral mucosa is with no cyanosis.  Jugular veins are flat.  Respirations are nonlabored and lungs  are clear to auscultation.  Cardiac exam shows no lifts or thrills.  Rhythm is regular, with no murmurs, rubs or gallops.  Carotid upstrokes are normal with no bruits.  Abdomen is with no palpable hepatomegaly or tenderness.  The abdominal aorta is without bruits or palpable enlargement.  Upper extremity digits show no cyanosis.  There is no lower extremity edema.  Pedal pulses are 2+ bilaterally.  Neuro: Neuro: gait is Labored.  Skin: Skin: no lesions noted.    Review of the 30 day event monitor shows that the patient is having intermittent episodes of paroxysmal atrial fibrillation.  The episodes are brief, but her heart rates in the 130 to 140 bpm range.    IMPRESSION AND PLAN:  (Z79.01) Chronic anticoagulation  (primary encounter diagnosis)  Plan: The patient is  chronically anticoagulated on warfarin.  With her having episodes of breakthrough paroxysmal atrial fibrillation, I would not recommend stopping anticoagulation at this time.  I discussed the possibility the patient using one of the newer anticoagulants.  She will consider this further.    (I10) Essential hypertension  Plan: The patient's blood pressure is mildly elevated today, but is normally well controlled.    (I48.0) Paroxysmal atrial fibrillation ([place])  Plan: The patient has breakthroughs of atrial fibrillation.  The episodes are brief.  I would not recommend any changes present time.    I will see the patient back in follow-up in one month.          PROBLEM LIST:  Patient Active Problem List    Diagnosis Date Noted    Primary osteoarthritis of both knees 09/12/2017    Pain in both knees 09/12/2017    Chronic anticoagulation 08/02/2015    Right cervical radiculopathy 05/30/2015    Obesity 05/30/2015    Paroxysmal atrial fibrillation ([place]) 04/17/2014     A. S/p ablation ([facility]) 10/19/2016      GERD (gastroesophageal reflux disease) 11/08/2012    OA (osteoarthritis) 07/28/2012    Asthma 07/24/2012    Hypercholesterolemia 07/24/2012    Edema 07/24/2012    Sleep apnea 07/24/2012    HTN (hypertension) 07/24/2012    Pulmonary nodule 07/24/2012    Grief reaction 07/24/2012     05/11/2011         The patient has a past medical history of Anxiety, Cervical disc disease, GERD (gastroesophageal reflux disease), Leukocytosis, Muscle cramps (04/2011), Osteoarthritis, and Pain of toe of right foot (02/2010).  Past Surgical History:   Procedure Laterality Date    COLONOSCOPY FLX DX W/COLLJ SPEC WHEN PFRMD  11/2005    SURGICAL HX OTHER  08/1999    knee surgery    UNLISTED PROCEDURE FEMUR/KNEE      UNLISTED PROCEDURE NECK/THORAX         SOCIAL HISTORY:  Social History     Socioeconomic History    Marital status: Widowed     Spouse name: Not on file    Number of children: 0    Years of education: Not on file     Highest education level: Not on file   Occupational History    Not on file   Social Needs    Financial resource strain: Not on file    Food insecurity:     Worry: Not on file     Inability: Not on file    Transportation needs:     Medical: Not on file     Non-medical: Not on file   Tobacco Use    Smoking  status: Never Smoker    Smokeless tobacco: Never Used   Substance and Sexual Activity    Alcohol use: No     Alcohol/week: 0.0 standard drinks     Comment: rare     Drug use: No    Sexual activity: Not Currently   Lifestyle    Physical activity:     Days per week: Not on file     Minutes per session: Not on file    Stress: Not on file   Relationships    Social connections:     Talks on phone: Not on file     Gets together: Not on file     Attends religious service: Not on file     Active member of club or organization: Not on file     Attends meetings of clubs or organizations: Not on file     Relationship status: Not on file    Intimate partner violence:     Fear of current or ex partner: Not on file     Emotionally abused: Not on file     Physically abused: Not on file     Forced sexual activity: Not on file   Other Topics Concern    Not on file   Social History Narrative    Widow  Technical sales engineer  Rare ETOH  Former Smoker No social drug use. Rare exercise.         DXA completed on 07/31/12, resulting in normal bone density of the lumbar spine, and normal bone density at the L hip; ordered 04/30/15        Mammogram performed on 05/27/14, resulting in no mammographic features of malignancy         Colonoscopy performed on 04/03/14 with Dr. Estill Bamberg, resulting in moderate diverticulosis of the left side of the colon, long redundant colon, cleared for seven years         TDAP- 03/13/10    Zostavax-advised 04/30/15        PAP-monitored by Dr. Harmon Pier        HIV screening, Hepatitis C screening-done in 2014       FAMILY HISTORY:  Family History     Problem (# of Occurrences) Relation (Name,Age of Onset)     Anesthesia Problems (1) Mother    Arthritis (3) Mother, Father, Other: grandmother    Asthma (1) Other: and/or allergies - grandfather    Breast Cancer (2) Mother: & tongue cancer, Other: grandmother    Cancer (2) Father, Other: grandparents    Colon Polyps (1) Father    Diabetes (1) Father    Heart Disease (3) Father: torn valve 01/1999, Maternal Grandfather, Paternal Grandfather    Hypertension (2) Mother, Father    Lupus (1) Aunt/Uncle: great-aunt    Mental Illness (2) Mother, Brother    Stroke (2) Mother: possible, Father    Tuberculosis (1) Other: great-grandmother          Electronically signed by Marquis Buggy, MD, [facility]  Please note:  This document was created using speech recognition technology.    cc: Jerald Kief, MD  [address] [place] [address]  Patient Care Team:  El-Attar, Hanley Seamen, MD as PCP - General (Family Practice)  Ivan Croft, Katherine Basset, MD (Cardiology)  Ronalee Red, RD as Dietitian (Dietitian/Nutritionist)  Dolack, Marcine Matar, MD as Consulting Physician (Internal Medicine)

## 2017-11-03 NOTE — Patient Instructions (Signed)
Return in 1 month

## 2017-11-11 IMAGING — DX CHEST PA AND LATERAL
1 series · 2 of 2 positions shown · non-contrast
Comparison: August 27, 2016

CLINICAL INDICATION:  Right anterior pain

[Series 1: PA · U · 0.14mm/px · 2 of 2 slices shown]
[im 1/2]
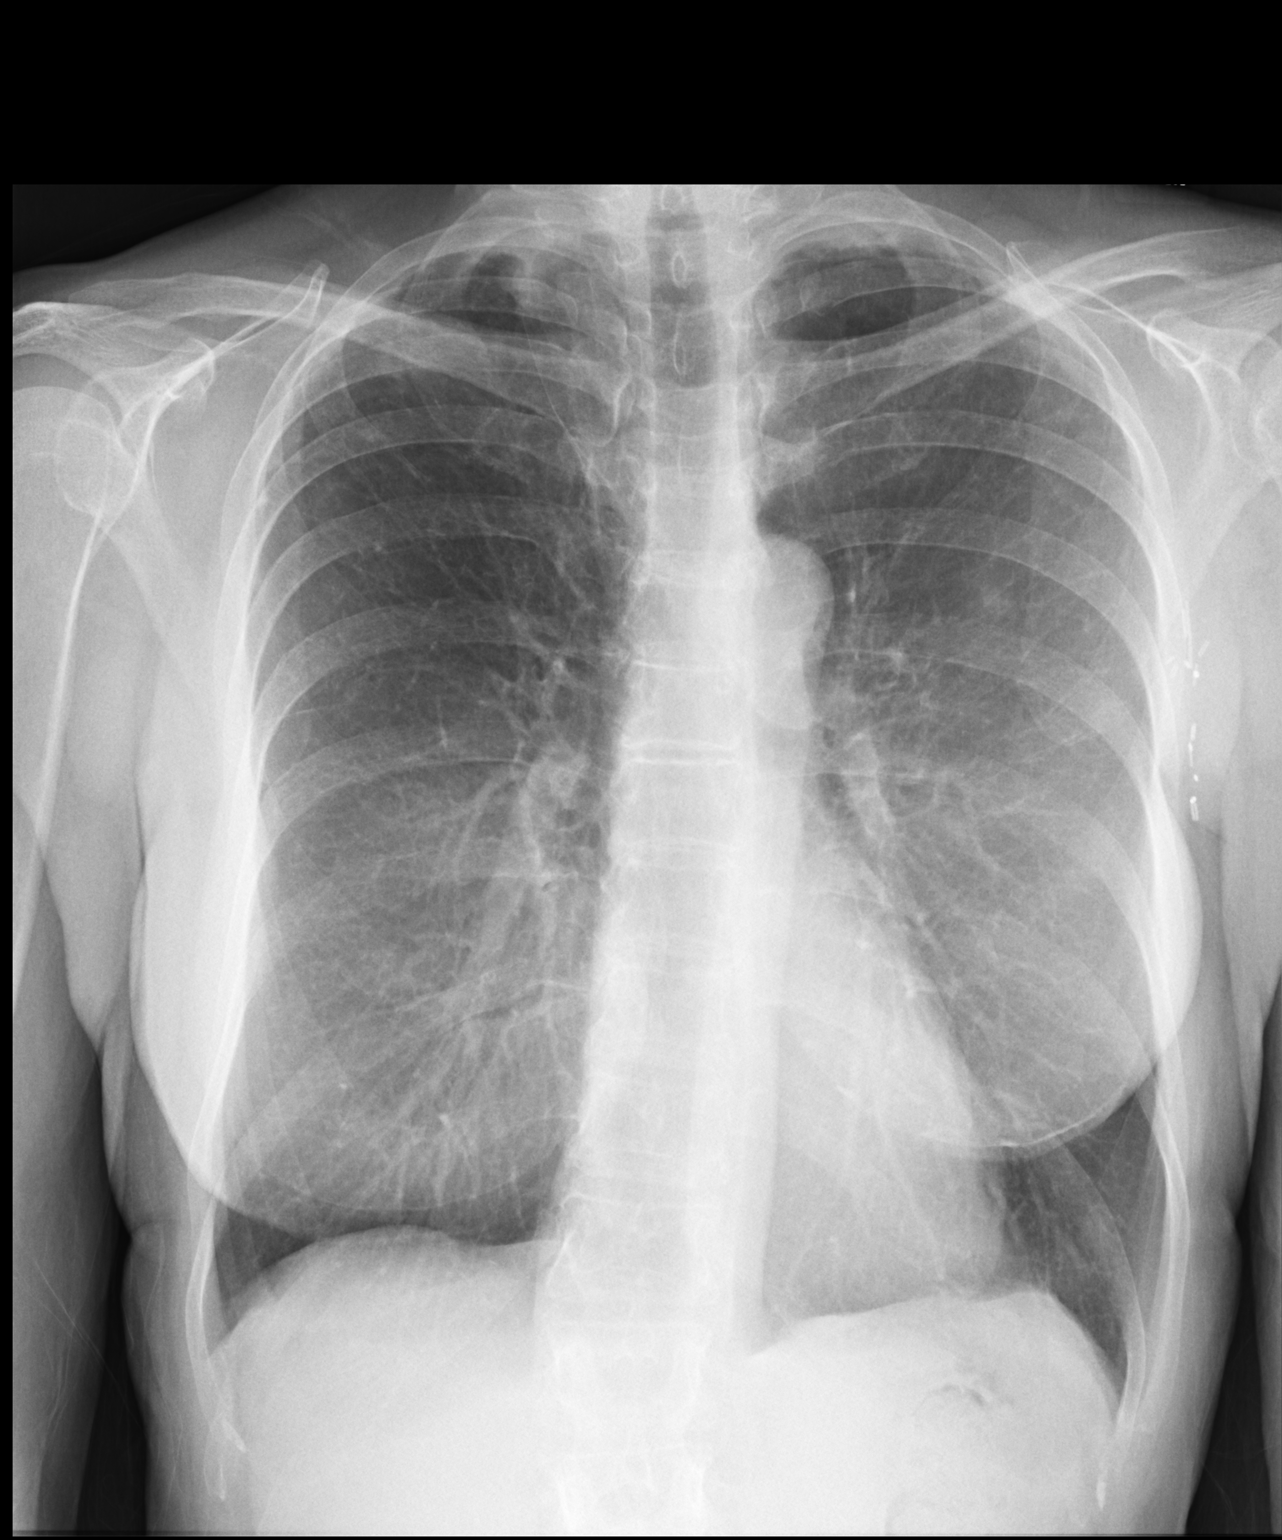
[im 2/2]
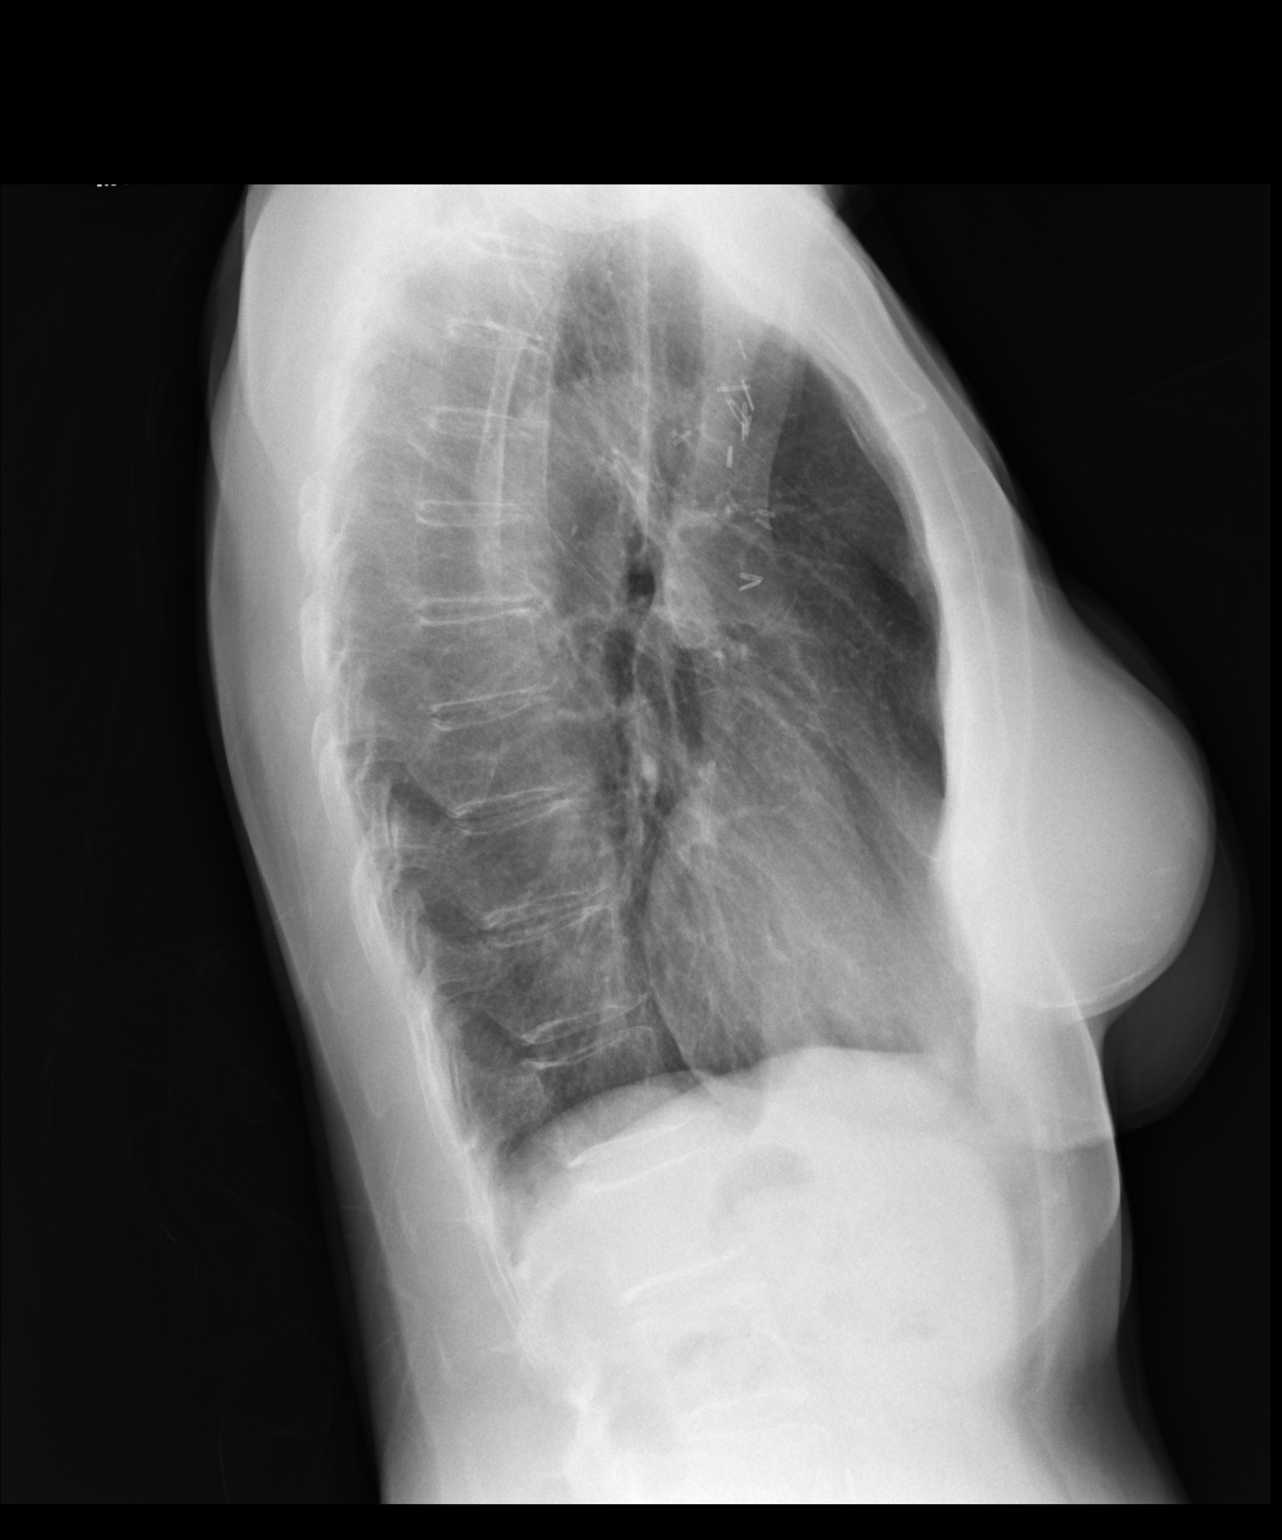

[2 of 2 positions shown; findings below may reference images not displayed]

FINDINGS: Postoperative changes left chest wall. Mild atherosclerotic changes. 
Stable density overlying the left upper thorax and both apices. No new mass or 
consolidation. No pleural effusion seen.
IMPRESSION: Stable chest x-ray appearance.

## 2017-11-17 ENCOUNTER — Ambulatory Visit: Payer: Medicare HMO | Attending: Internal Medicine

## 2017-11-17 ENCOUNTER — Telehealth (HOSPITAL_BASED_OUTPATIENT_CLINIC_OR_DEPARTMENT_OTHER): Payer: Self-pay | Admitting: Internal Medicine

## 2017-11-17 DIAGNOSIS — I480 Paroxysmal atrial fibrillation: Secondary | ICD-10-CM | POA: Insufficient documentation

## 2017-11-17 LAB — PR PROTHROMBIN TIME, ONSITE: prothrombin INR: 2.4

## 2017-11-17 NOTE — Telephone Encounter (Signed)
Jayme Cloud, MD  Olean Ree, RN   Caller: Unspecified (Today, 2:27 PM)            Topical analgesics are fine.          I called pt and passed on Dr Catha Gosselin message to her.     She was thankful for Dr Catha Gosselin care.    Orvilla Cornwall RN, [facility] Team

## 2017-11-17 NOTE — Telephone Encounter (Signed)
Pt in for an INR / Protime check today.    She is having trouble sleeping lately. Caitlin Wiley keeps waking up every one to two hours because of nightly arthritis pain (worse in the knees).    Caitlin Wiley is wondering if she can safely use Lanacane cream, or lidocaine patches or aspercream to treat her pain - she is concerned about possible interactions with Warfarin therapy.    She is using occasional Arnica cream .    I called [facility] pharmacist, and she states that there should not be any interactions with topical Lidocaine products and Warfarin, but that pain itself can increase pt's INR !    I told her I would have Dr Dorothyann Peng review her question as well to see if he has any reservations or concerns.    Routing to Dr Dorothyann Peng for his review.

## 2017-11-17 NOTE — Progress Notes (Signed)
Anticoag Treatment Plan  As of 11/17/2017    INR goal:   2.0-3.0   TTR:   81.4 % (4.2 y)   INR used for dosing:   2.4 (11/17/2017)   Full warfarin instructions:   5 mg every Sun, Tue, Thu; 7.5 mg all other days   Weekly warfarin total:   45 mg   Next INR check:   12/08/2017   Target end date:       Indications    Paroxysmal atrial fibrillation ([name]) [I48.0]             Anticoagulation Episode Summary     INR check location:       Send INR reminders to:   [facility] EDM CLINICAL SUPPORT STAFF POOL    Comments:               SUBJECTIVE    Patient Findings     Negatives:   Signs/symptoms of thrombosis, Signs/symptoms of bleeding, Laboratory test error suspected, Change in health, Change in alcohol use, Change in activity, Upcoming invasive procedure, Emergency department visit, Upcoming dental procedure, Missed doses, Extra doses, Change in medications, Change in diet/appetite, Hospital admission, Bruising, Other complaints          OBJECTIVE    Anticoag Visit Date 11/17/2017 11/03/2017 10/11/2017 10/03/2017 09/02/2017 08/23/2017 07/27/2017   INR Goal 2.0-3.0 2.0-3.0 2.0-3.0 2.0-3.0 2.0-3.0 2.0-3.0 2.0-3.0   Selected INR 2.4 1.9 2.1 1.8 2.1 1.9 2.2   INR Date 11/17/2017 11/03/2017 10/11/2017 10/03/2017 09/02/2017 08/23/2017 07/27/2017   Previous Week Total 45 mg 42.5 mg 42.5 mg 40 mg 40 mg 40 mg 40 mg   Pt Deviation No No No No No No No   Pt Findings Comments - Took 5 mg instead of 7.5 mg one day this week.  - Patient reports she may have missed half a dose since she found half a pill on the floor. Pt received cortisone injection in her knee last week. Patient also using biofreeze cream for musculoskeletal pain at times.  Patient using walker now due to left leg pain, referred to physical therapy.  Pt supposed to be having endometrial biopsy, not scheduled yet. Has been taking warfarin 7.5 mg MWF and 5 mg all other days. Pt to continue this dose and return for INR in   1 month.  Patient forgot and took 5 mg instead of 7.5 mg yesterday.  She continues to eat greens daily and has been consistent with diet. No other findings.  -   Next Week Total 45 mg 45 mg 42.5 mg 42.5 mg 40 mg 40 mg 40 mg   Full Instructions 5 mg every Sun, Tue, Thu; 7.5 mg all other days 5 mg every Sun, Tue, Thu; 7.5 mg all other days 7.5 mg every Mon, Thu, Sat; 5 mg all other days 7.5 mg every Mon, Thu, Sat; 5 mg all other days 7.5 mg every Mon, Thu; 5 mg all other days 7.5 mg every Mon, Thu; 5 mg all other days 7.5 mg every Mon, Thu; 5 mg all other days   Date of Next INR 12/08/2017 11/17/2017 10/25/2017 10/10/2017 09/30/2017 09/02/2017 08/27/2017       ASSESSMENT    Therapeutic INR in stable patient and without complications.     PLAN    1.  Continue Warfarin  7.5 mg x 4 days a week and 5 mg x 3 days a week (T/Th/Sun. Re check INR in three weeks.      .  As of  11/17/2017    Warfarin maintenance plan:   5 mg every Sun, Tue, Thu; 7.5 mg all other days   Full warfarin instructions:   5 mg every Sun, Tue, Thu; 7.5 mg all other days   Weekly warfarin total:   45 mg   No change documented:   Caitlin Ree, RN   Next INR check:   12/08/2017         Anticoagulation Episode Summary     Comments:             2. Caitlin Wiley acknowledged understanding of this plan      PATIENT INSTRUCTIONS    October 2019 Details    Sun Mon Tue Wed Thu Fri Sat       1               2               3               4               5                 6               7               8               9               10               11               12                 13               14               15               16               17               18               19                 20               21               22               23               24               25               26                 27               28               29               30               31      5  mg  See details         Date Details   10/31 This INR check              November 2019 Details    Sun Mon Tue Wed Thu  Fri Sat          1      7.5 mg         2      7.5 mg           3      5 mg         4      7.5 mg         5      5 mg         6      7.5 mg         7      5 mg         8      7.5 mg         9      7.5 mg           10      5 mg         11      7.5 mg         12      5 mg         13      7.5 mg         14      5 mg         15      7.5 mg         16      7.5 mg           17      5 mg         18      7.5 mg         19      5 mg         20      7.5 mg         21      5 mg         22               23                 24               25               26               27               28               29               30                 Date Details   No additional details    Date of next INR:  12/08/2017

## 2017-11-19 ENCOUNTER — Other Ambulatory Visit (INDEPENDENT_AMBULATORY_CARE_PROVIDER_SITE_OTHER): Payer: Self-pay | Admitting: Family Medicine

## 2017-11-19 ENCOUNTER — Encounter (INDEPENDENT_AMBULATORY_CARE_PROVIDER_SITE_OTHER): Payer: Self-pay | Admitting: Family Medicine

## 2017-11-19 DIAGNOSIS — R0602 Shortness of breath: Secondary | ICD-10-CM

## 2017-11-21 MED ORDER — VENTOLIN HFA 108 (90 BASE) MCG/ACT IN AERS
INHALATION_SPRAY | RESPIRATORY_TRACT | 3 refills | Status: DC
Start: 2017-11-21 — End: 2017-12-29

## 2017-11-22 ENCOUNTER — Ambulatory Visit (INDEPENDENT_AMBULATORY_CARE_PROVIDER_SITE_OTHER): Payer: Medicare HMO | Admitting: Nurse Practitioner

## 2017-11-22 VITALS — BP 104/70 | HR 81 | Temp 98.1°F | Resp 16 | Wt 266.0 lb

## 2017-11-22 DIAGNOSIS — Z6841 Body Mass Index (BMI) 40.0 and over, adult: Secondary | ICD-10-CM

## 2017-11-22 DIAGNOSIS — M19049 Primary osteoarthritis, unspecified hand: Secondary | ICD-10-CM

## 2017-11-22 LAB — COMPREHENSIVE METABOLIC PANEL
ALT (GPT): 8 U/L (ref 7–33)
AST (GOT): 13 U/L (ref 9–38)
Albumin: 4 g/dL (ref 3.5–5.2)
Alkaline Phosphatase (Total): 75 U/L (ref 38–172)
Anion Gap: 10 (ref 4–12)
Bilirubin (Total): 0.5 mg/dL (ref 0.2–1.3)
Calcium: 9.6 mg/dL (ref 8.9–10.2)
Carbon Dioxide, Total: 27 meq/L (ref 22–32)
Chloride: 103 meq/L (ref 98–108)
Creatinine: 0.71 mg/dL (ref 0.38–1.02)
GFR, Calc, African American: >60 mL/min/{1.73_m2} (ref >59)
GFR, Calc, European American: >60 mL/min/{1.73_m2} (ref >59)
Glucose: 93 mg/dL (ref 62–125)
Potassium: 3.8 meq/L (ref 3.6–5.2)
Protein (Total): 6.6 g/dL (ref 6.0–8.2)
Sodium: 140 meq/L (ref 135–145)
Urea Nitrogen: 17 mg/dL (ref 8–21)

## 2017-11-22 LAB — CBC, DIFF
% Basophils: 1 %
% Eosinophils: 2 %
% Immature Granulocytes: 0 %
% Lymphocytes: 19 %
% Monocytes: 6 %
% Neutrophils: 72 %
% Nucleated RBC: 0 %
Absolute Eosinophil Count: 0.23 10*3/uL (ref 0.00–0.50)
Absolute Lymphocyte Count: 1.95 10*3/uL (ref 1.00–4.80)
Basophils: 0.09 10*3/uL (ref 0.00–0.20)
Hematocrit: 46 % — ABNORMAL HIGH (ref 36–45)
Hemoglobin: 14.9 g/dL (ref 11.5–15.5)
Immature Granulocytes: 0.03 10*3/uL (ref 0.00–0.05)
MCH: 27.9 pg (ref 27.3–33.6)
MCHC: 32.7 g/dL (ref 32.2–36.5)
MCV: 85 fL (ref 81–98)
Monocytes: 0.56 10*3/uL (ref 0.00–0.80)
Neutrophils: 7.4 10*3/uL — ABNORMAL HIGH (ref 1.80–7.00)
Nucleated RBC: 0 10*3/uL
Platelet Count: 341 10*3/uL (ref 150–400)
RBC: 5.35 10*6/uL — ABNORMAL HIGH (ref 3.80–5.00)
RDW-CV: 14.8 % — ABNORMAL HIGH (ref 11.6–14.4)
WBC: 10.26 10*3/uL — ABNORMAL HIGH (ref 4.3–10.0)

## 2017-11-22 LAB — RHEUMATOID FACTOR: Rheumatoid Factor: 141 [IU]/mL — ABNORMAL HIGH (ref <15)

## 2017-11-22 NOTE — Progress Notes (Signed)
Visit: see chief complaint     Refills? NO  Referral? YES  Letter or Form? NO  Lab Results? NO    HEALTH MAINTENANCE:  Has the patient has this done since their last visit?  Cervical screening/PAP: N/A  Mammo: N/A  Colon Screen: N/A  Diabetic Eye Exam (If applicable): N/A    Have you seen a specialist since your last visit: on file    Vaccines Due? No    Does patient have eCare?  yes    HM Due:   Health Maintenance   Topic Date Due    Zoster Vaccine (1 of 2) 03/02/2001    Osteoporosis Screening  04/29/2017    Pneumococcal Vaccine: 65+ years (2 of 2 - PPSV23) 08/20/2017    Influenza Vaccine (1) 10/18/2017    Medicare Annual Wellness Visit  09/02/2018    Depression Screening (PHQ-2)  09/15/2018    Breast Cancer Screening  08/12/2019    Tetanus Vaccine  03/13/2020    Diabetes Screening  08/23/2020    Colorectal Cancer Screening (Colonoscopy)  04/02/2021    Lipid Disorders Screening  08/24/2022    Hepatitis C Screening  Completed    Pneumococcal Vaccine: Pediatrics (0-5 years) and At-Risk Patients (6-64 years)  Aged Out       PCP Verified?  Yes, el-attar

## 2017-11-22 NOTE — Patient Instructions (Signed)
We will check labs today and refer you to OT.   Based on lab results we may refer to rheumatologist.   Please see Dr. Velvet Bathe in follow-up if not improving.

## 2017-11-22 NOTE — Progress Notes (Signed)
Chief Complaint   Patient presents with    Musculoskeletal Problem     Pt c/o discomfort, swelling, LROM in R middle finger x 3wks.  Pt states she has occasional pain and stiffness in her wrists, shoulders, hands off and on.  Wondering if this is all related, arthritis?  WOuld like to have referral to rheumatology.     Swelling     Pt has also noted her tonsills/lymph nodes i her throat swell off and on.       Primary care provider: Jerald Kief, MD  Subjective   Caitlin Wiley is a 67 year old female with a history of hypertension, anticoagulated for atrial fibrillation, and osteoarthritis of her knees who presents on 11/22/2017 for the following concerns:    Limited range of motion of her middle finger right hand.  Range of motion of the middle finger right hand has been limited for the past 3 weeks.  Active range of motion is limited.  Passive range of motion is possible with some discomfort.  The middle finger has not been hot or swollen.  There is no discernible change in symptoms throughout the day.   Pain is described as dull or neutral 2/10 at rest with sharp 5/10 pain with movement.   She denies trauma or prior injury.  Denies any flicking motions.   Color, warmth, and sensation are normal.  She is right-handed, is an Advertising account executive.  In terms of other joint discomfort her knees are chronically painful from osteoarthritis.  She occasionally has pain in the DIPs and PIPs of both hands though has not had any limitations in range of motion previously.  She's also had pain in her shoulders, though none currently.    Known prior osteoarthritis of both knees for which she is attending physical therapy. She mobilizes with assistance of a walker due to arthritis in her knees.  She also uses a cane from time to time.    Anticoagulated with warfarin for atrial fibrillation     Review of Systems   Constitutional: Negative for chills, fever and weight loss.   Gastrointestinal: Abdominal pain:         Musculoskeletal: Positive for joint pain. Negative for falls and myalgias.   Neurological: Negative for tingling, sensory change and weakness.     I reviewed the patient's recorded medical history, and confirmed the following: medications, problem list, allergies and past medical history    Objective   Caitlin Wiley presents to the clinic alone. .   Vitals: BP 104/70    Pulse 81    Temp 98.1 F (36.7 C) (Temporal)    Resp 16    Wt (!) 266 lb (120.7 kg)    LMP  (LMP Unknown)    SpO2 96%    BMI 42.93 kg/m     Physical Exam   Constitutional: She is oriented to person, place, and time and well-developed, well-nourished, and in no distress.   Cardiovascular: Normal rate and regular rhythm.   Pulmonary/Chest: Effort normal and breath sounds normal. Musculoskeletal:      Right forearm: Normal.      Right hand: She exhibits decreased range of motion and swelling. She exhibits no tenderness, no bony tenderness and normal capillary refill. Normal sensation noted.      Left hand: Normal.      Comments: Limited active ROM of DIP and PIP of middle finger of right hand. Passive ROM without crepitus. Hand and wrist joints without swelling or redness. Temperature normal.  Neurological: She is alert and oriented to person, place, and time.   Psychiatric: Mood and affect normal.       Assessment & Plan   Caitlin Wiley is a 67 year old female with:    Hand arthritis    Caron presents with limited range of motion of her right middle finger for the past 3 weeks.  It does not appear to be infected, there is no trauma suggesting fracture, and the joints are without obvious effusion.  Differential is mechanical versus inflammatory arthritis.  We will draw labs for rheumatoid arthritis, and referred to a rheumatologist if positive.  X-rays are deferred at this time.  I have recommended acetaminophen as needed for pain and provided a referral to occupational therapy.     - REFERRAL TO OCCUPATIONAL THERAPY  - Rheumatoid  Factor  - Anti C. Citrullinated Peptide2  - CBC with Differential  - Comprehensive Metabolic Panel    Advised to return to clinic if symptoms increase, persist, or worsen. Diagnosis, treatments, risks and alternatives discussed with patient and questions answered.      Fayne Mediate, ARNP  Nurse Practitioner, [facility]

## 2017-11-23 LAB — ANTI C. CITRULLINATED PEPTIDE2
Anti CCP2 Comment: POSITIVE
Anti CCP2 Level: 8.7 U/mL — ABNORMAL HIGH (ref 0.0–1.5)

## 2017-11-30 ENCOUNTER — Encounter (HOSPITAL_BASED_OUTPATIENT_CLINIC_OR_DEPARTMENT_OTHER): Payer: Medicare HMO | Admitting: Rehabilitative and Restorative Service Providers"

## 2017-11-30 ENCOUNTER — Ambulatory Visit: Payer: Medicare HMO | Attending: Rheumatology

## 2017-11-30 ENCOUNTER — Ambulatory Visit (INDEPENDENT_AMBULATORY_CARE_PROVIDER_SITE_OTHER): Payer: Medicare HMO | Admitting: Rheumatology

## 2017-11-30 VITALS — BP 113/71 | HR 85 | Wt 268.0 lb

## 2017-11-30 DIAGNOSIS — M25532 Pain in left wrist: Secondary | ICD-10-CM

## 2017-11-30 DIAGNOSIS — M170 Bilateral primary osteoarthritis of knee: Secondary | ICD-10-CM

## 2017-11-30 DIAGNOSIS — M150 Primary generalized (osteo)arthritis: Secondary | ICD-10-CM

## 2017-11-30 DIAGNOSIS — M0579 Rheumatoid arthritis with rheumatoid factor of multiple sites without organ or systems involvement: Secondary | ICD-10-CM

## 2017-11-30 DIAGNOSIS — M19072 Primary osteoarthritis, left ankle and foot: Secondary | ICD-10-CM

## 2017-11-30 DIAGNOSIS — M25561 Pain in right knee: Secondary | ICD-10-CM

## 2017-11-30 DIAGNOSIS — M159 Polyosteoarthritis, unspecified: Secondary | ICD-10-CM

## 2017-11-30 DIAGNOSIS — M25531 Pain in right wrist: Secondary | ICD-10-CM

## 2017-11-30 DIAGNOSIS — M79641 Pain in right hand: Secondary | ICD-10-CM

## 2017-11-30 DIAGNOSIS — M25562 Pain in left knee: Secondary | ICD-10-CM

## 2017-11-30 DIAGNOSIS — M8949 Other hypertrophic osteoarthropathy, multiple sites: Secondary | ICD-10-CM

## 2017-11-30 DIAGNOSIS — M180 Bilateral primary osteoarthritis of first carpometacarpal joints: Secondary | ICD-10-CM

## 2017-11-30 DIAGNOSIS — Z6841 Body Mass Index (BMI) 40.0 and over, adult: Secondary | ICD-10-CM

## 2017-11-30 DIAGNOSIS — M7752 Other enthesopathy of left foot: Secondary | ICD-10-CM

## 2017-11-30 DIAGNOSIS — M7751 Other enthesopathy of right foot: Secondary | ICD-10-CM

## 2017-11-30 DIAGNOSIS — M19071 Primary osteoarthritis, right ankle and foot: Secondary | ICD-10-CM

## 2017-11-30 DIAGNOSIS — M79642 Pain in left hand: Secondary | ICD-10-CM

## 2017-11-30 LAB — SED RATE: Erythrocyte Sedimentation Rate: 23 mm/h — ABNORMAL HIGH (ref 0–20)

## 2017-11-30 LAB — C_REACTIVE PROTEIN: C_Reactive Protein: 17.1 mg/L — ABNORMAL HIGH (ref 0.0–10.0)

## 2017-11-30 LAB — URIC ACID, SERUM: Uric Acid: 4.6 mg/dL (ref 2.6–6.8)

## 2017-11-30 NOTE — Progress Notes (Signed)
[place]  [address]  TEL: [phone]  l  FAX: [phone]    New Patient Initial Evaluation  11/30/2017    PRIMARY CARE PROVIDER:  Jerald Kief, MD  [address] [place] [address]    CONSULTING PROVIDER:  [place] [address]    IDENTIFYING DATA/CC:    Caitlin Wiley is a 67 year old female seen today for an initial consultation of inflammatory arthritis.    HISTORY OF PRESENT ILLNESS:  Caitlin Wiley is a very pleasant 67 year old woman who is referred to rheumatology by her primary care provider for evaluation of inflammatory arthritis.  Of note she was previously evaluated by Dr. Cala Bradford in our clinic in 2013 with the same query. She had joint symptoms with modest titer rheumatoid factor and low titer anti-CCP autoantibody at that time. At that time, Dr. Terance Hart felt that it was unlikely that she had rheumatoid arthritis based on her clinical manifestations. Patient reports since then she has had various joint pain most notably in her bilateral knees which has been attributed to osteoarthritis. She is a candidate for joint replacement surgery for both knees but she must first lose weight.  Due to this she has lost 55 pounds in the last 7 months with exercise and diet.  However in the last few months she has noticed more persistent joint pain involving her right third and fourth MCP and bilateral wrists.  There is associated swelling.  Pain is worse in the morning and seems to improve with movement.  She also has 1-2 hours of morning stiffness.  She does not have much back or neck pain.  She has tried taking acetaminophen without much benefit.  She cannot take NSAIDs due to concurrent anticoagulation therapy.  She has never been on steroids for treatment of joint disease. She also has some stiffness in bilateral shoulders however this is improved with physical therapy.    She has a history of positive  rheumatoid factor titer since 2013.  A recent check by her PCP showed consistent level of rheumatoid factor in the 140s but now anti-CCP autoantibody level of 8.7. Patient does not have history of psoriasis, inflammatory eye or bowel disease. She has patchy eczema. No photosensitive or malar rash. No raynaud's phenomenon.    REVIEW OF SYSTEMS:  In addition to those listed in history of present illness, patient also reports itching eyes, ringing in ears, difficulty swallowing, vaginal dryness and nocturia, sun sensitivity, easy bruising, dizziness, anxiety and depression, swollen glands.  Complete ROS is negative, except for those mentioned in HPI.    PAST MEDICAL/SURGICAL HISTORY:  Patient Active Problem List    Diagnosis Date Noted    Primary osteoarthritis of both knees [M17.0] 09/12/2017    Pain in both knees [M25.561, M25.562] 09/12/2017    Chronic anticoagulation [Z79.01] 08/02/2015    Right cervical radiculopathy [M54.12] 05/30/2015    Obesity [E66.9] 05/30/2015    Paroxysmal atrial fibrillation ([place]) [I48.0] 04/17/2014     A. S/p ablation ([facility] PVI) 10/19/2016      GERD (gastroesophageal reflux disease) [K21.9] 11/08/2012    OA (osteoarthritis) [M19.90] 07/28/2012    Asthma [J45.909] 07/24/2012    Hypercholesterolemia [E78.00] 07/24/2012    Edema [R60.9] 07/24/2012    Sleep apnea [G47.30] 07/24/2012    HTN (hypertension) [I10] 07/24/2012    Pulmonary nodule [R91.1] 07/24/2012  Grief reaction [F43.21] 07/24/2012     05/11/2011         FAMILY HISTORY:  Family History     Problem (# of Occurrences) Relation (Name,Age of Onset)    Anesthesia Problems (1) Mother    Arthritis (3) Mother, Father, Other: grandmother    Asthma (1) Other: and/or allergies - grandfather    Breast Cancer (2) Mother: & tongue cancer, Other: grandmother    Cancer (2) Father, Other: grandparents    Colon Polyps (1) Father    Diabetes (1) Father    Heart Disease (3) Father: torn valve 01/1999, Maternal Grandfather, Paternal  Grandfather    Hypertension (2) Mother, Father    Lupus (1) Aunt/Uncle: great-aunt    Mental Illness (2) Mother, Brother    Stroke (2) Mother: possible, Father    Tuberculosis (1) Other: great-grandmother      Denies history of inflammatory arthritis, psoriasis, inflammatory eye or bowel disease.    SOCIAL HISTORY:  Social History     Socioeconomic History    Marital status: Widowed     Spouse name: Not on file    Number of children: 0    Years of education: Not on file    Highest education level: Not on file   Occupational History    Not on file   Social Needs    Financial resource strain: Not on file    Food insecurity:     Worry: Not on file     Inability: Not on file    Transportation needs:     Medical: Not on file     Non-medical: Not on file   Tobacco Use    Smoking status: Never Smoker    Smokeless tobacco: Never Used   Substance and Sexual Activity    Alcohol use: No     Alcohol/week: 0.0 standard drinks     Comment: rare     Drug use: No    Sexual activity: Not Currently   Lifestyle    Physical activity:     Days per week: Not on file     Minutes per session: Not on file    Stress: Not on file   Relationships    Social connections:     Talks on phone: Not on file     Gets together: Not on file     Attends religious service: Not on file     Active member of club or organization: Not on file     Attends meetings of clubs or organizations: Not on file     Relationship status: Not on file    Intimate partner violence:     Fear of current or ex partner: Not on file     Emotionally abused: Not on file     Physically abused: Not on file     Forced sexual activity: Not on file   Other Topics Concern    Not on file   Social History Narrative    Widow  Technical sales engineer  Rare ETOH  Former Smoker No social drug use. Rare exercise.         DXA completed on 07/31/12, resulting in normal bone density of the lumbar spine, and normal bone density at the L hip; ordered 04/30/15        Mammogram performed on  05/27/14, resulting in no mammographic features of malignancy         Colonoscopy performed on 04/03/14 with Dr. Estill Bamberg, resulting in moderate diverticulosis of the left side  of the colon, long redundant colon, cleared for seven years         TDAP- 03/13/10    Zostavax-advised 04/30/15        PAP-monitored by Dr. 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 Likes to paint birds. Remote history of tobacco use. No EtOH or illicit drugs.    ALLERGIES:  Review of patient's allergies indicates:  Allergies   Allergen Reactions    Bee Venom Anaphylaxis    Ciprofloxacin Other     Joint aching    Pneumococcal Vaccines Hives    Sulfa Antibiotics Hives    Cats [Animals] Itching    Dust Mite Extract Itching     DUST    Pollen Extract Itching       MEDICATIONS:  Current Outpatient Medications   Medication Sig Dispense Refill    Acetaminophen (TYLENOL) 325 MG Oral Tab Take 325 mg by mouth every 6 hours as needed.      Cholecalciferol (VITAMIN D3 OR) Take by mouth.      Flaxseed, Linseed, (FLAXSEED OIL) 1200 MG Oral Cap None Entered      GLUCOSAMINE CHONDROITIN COMPLX OR       Magnesium 400 MG Oral Cap Take 400 mg by mouth.      Montelukast Sodium 10 MG Oral Tab Take 1 tablet (10 mg) by mouth daily. 90 tablet 3    Multiple Vitamins-Minerals (MULTIVITAMIN OR) no iron      Probiotic Product (PROBIOTIC DAILY OR)       raNITIdine HCl 150 MG Oral Tab Take 1 tablet (150 mg) by mouth 2 times a day. 180 tablet 3    triamterene-hydrochlorothiazide 37.5-25 MG Oral Tab Take 1 tablet by mouth daily. 90 tablet 3    Unclassified (OTHER MEDS, SEE COMMENTS,) Curamin oral tab daily      VENTOLIN HFA 108 (90 Base) MCG/ACT inhaler INHALE ONE TO TWO PUFFS BY MOUTH EVERY 6 HOURS AS NEEDED FOR SHORTNESS OF BREATH /WHEEZING 18 g 3    warfarin (COUMADIN) 5 MG Oral Tab Take 1 tablet (5 mg) by mouth daily. 1 tablet daily or as directed 110 tablet 3     No  current facility-administered medications for this visit.        PHYSICAL EXAMINATION:  Vital signs:  BP 113/71    Pulse 85    Wt (!) 268 lb (121.6 kg)    LMP  (LMP Unknown)    BMI 43.26 kg/m   General:  Awake, alert, and oriented, no apparent distress, pleasant, and cooperative  Psychologic:  Mood is euthymic, affect is congruent  EYES: Anicteric sclera, no conjunctival injection  ENT:  Normocephalic, atraumatic, moist membranes, no oral ulcers, normal salivary pool   Pulmonary:  Non-labored breathing, no wheezes, rhonchi, or rales  Cardiovascular:  RRR, no murmurs, rubs or gallops  Abdomen: +BS. Soft, nondistended, nontender. No heptosplenomegaly.  Skin:  Normal temperature and texture, no visible rashes, ulcers, or nodules  Neurologic:  PERRL. EOMI. Face symmetric. Tongue midline. Light touch sensation is grossly intact.    Musculoskeletal:     Gait: Normal   Joints: No evidence of synovitis. Bilateral ulnar wrist swelling with R>L tenderness with palpation. Also swelling particularly notable in the palmar right 3/4 MCP. These joints are tender to palpation as well. Mild discomfort with full 180 abduction of the shoulders. Modified FABERs maneuver negative. Full  active ROM. Negative enthesopathy.   Nails: No pitting, clubbing, or cyanosis noted in the fingernails bilaterally    Muscle strength: Strength is grossly 5 out of 5 throughout the bilateral upper and lower extremities    REVIEW AND SUMMATION OF OLD RECORDS:  I reviewed notes, laboratory evaluation, imaging studies found in epic, care everywhere, but reports.  In addition to what is noted in history of present illness see below for pertinent details.  Laboratory evaluation obtained July 05, 2011.  HLA-B27 negative  Anti-CCP autoantibody 16.1  Uric acid 4.7  Total IgA, IgE 194, 76 respectively  Celiac panel negative    Laboratory evaluation May 18, 2011.    ANA negative    LABS:    Results for orders placed or performed in visit on 11/22/17    Rheumatoid Factor   Result Value Ref Range    Rheumatoid Factor 141 (H) <15 [IU]/mL   Anti C. Citrullinated Peptide2   Result Value Ref Range    Anti CCP2 Level 8.7 (H) 0.0 - 1.5 U/mL    Anti CCP2 Comment Low Positive    CBC with Differential   Result Value Ref Range    WBC 10.26 (H) 4.3 - 10.0 10*3/uL    RBC 5.35 (H) 3.80 - 5.00 10*6/uL    Hemoglobin 14.9 11.5 - 15.5 g/dL    Hematocrit 46 (H) 36 - 45 %    MCV 85 81 - 98 fL    MCH 27.9 27.3 - 33.6 pg    MCHC 32.7 32.2 - 36.5 g/dL    Platelet Count 341 150 - 400 10*3/uL    RDW-CV 14.8 (H) 11.6 - 14.4 %    % Neutrophils 72 %    % Lymphocytes 19 %    % Monocytes 6 %    % Eosinophils 2 %    % Basophils 1 %    % Immature Granulocytes 0 %    Neutrophils 7.40 (H) 1.80 - 7.00 10*3/uL    Absolute Lymphocyte Count 1.95 1.00 - 4.80 10*3/uL    Monocytes 0.56 0.00 - 0.80 10*3/uL    Absolute Eosinophil Count 0.23 0.00 - 0.50 10*3/uL    Basophils 0.09 0.00 - 0.20 10*3/uL    Immature Granulocytes 0.03 0.00 - 0.05 10*3/uL    Nucleated RBC 0.00 0.00 10*3/uL    % Nucleated RBC 0 %   Comprehensive Metabolic Panel   Result Value Ref Range    Sodium 140 135 - 145 meq/L    Potassium 3.8 3.6 - 5.2 meq/L    Chloride 103 98 - 108 meq/L    Carbon Dioxide, Total 27 22 - 32 meq/L    Anion Gap 10 4 - 12    Glucose 93 62 - 125 mg/dL    Urea Nitrogen 17 8 - 21 mg/dL    Creatinine 0.71 0.38 - 1.02 mg/dL    Protein (Total) 6.6 6.0 - 8.2 g/dL    Albumin 4.0 3.5 - 5.2 g/dL    Bilirubin (Total) 0.5 0.2 - 1.3 mg/dL    Calcium 9.6 8.9 - 10.2 mg/dL    AST (GOT) 13 9 - 38 U/L    Alkaline Phosphatase (Total) 75 38 - 172 U/L    ALT (GPT) 8 7 - 33 U/L    GFR, Calc, European American >60 >59 mL/min/[1.73_m2]    GFR, Calc, African American >60 >59 mL/min/[1.73_m2]    GFR, Information       Calculated GFR in mL/min/1.73 m2 by MDRD equation.  Inaccurate with changing renal function.  See [email or web address]       IMAGING/DIAGNOSTIC RESULTS:   Xrays of hands, feet, knees  pending.    ASSESSMENT AND PLAN:  Caitlin Wiley is a 67 year old female seen today for an initial consultation of inflammatory arthritis.    #. Probable rheumatoid arthritis. Clinical history consistent with inflammatory arthritis with evidence of swelling on exam, albeit no synovitis. Also positive for both rheumatoid factor and anti-CCP, although the latter is very low titer. Nevertheless, I suspect patient has rheumatoid arthritis. I would like to check inflammatory markers, rule out infectious (especially in anticipation of immune suppressive therapy) and get xrays today. She probably also has concurrent osteoarthritis given her age.     - labs   - xrays      All the patient's questions were answered. The patient will follow up in 1-2 weeks.      Dictated using Chief of Staff and electronically signed by Roxy Horseman, MD   Note: This document may contain errors inherent to this technology. If you find any errors that might affect patient care, please contact our office as soon as possible.   [address] [place], [place], [place]  Ph. [phone] Fx. [phone]

## 2017-11-30 NOTE — Patient Instructions (Addendum)
It was a pleasure seeing you in clinic today.     Continue your current medications   Get labs/xrays after your appointment today   Follow up in 2 weeks    If you have any questions or concerns before your next appointment, please do not hesitate to contact me. Our clinic phone number is [phone] and I am also available via [facility].

## 2017-12-01 LAB — QUANTIFERON-TB GOLD PLUS
Quantiferon Nil Result: 0.03 IU gamma IF/mL
Quantiferon TB Interpretation: NEGATIVE
Quantiferon TB Mitogen minus Nil Result: >10 IU gamma IF/mL
Quantiferon TB1 Ag minus Nil Result: <0 IU gamma IF/mL
Quantiferon TB2 Ag minus Nil Result: <0 IU gamma IF/mL

## 2017-12-01 LAB — HEPATITIS B BATTERY (HBSAG W/RFLX PCR, ANTI-HBS, ANIT-HBC)
Hepatitis B Core Ab: NONREACTIVE
Hepatitis B Surf Antibody Intl Units: <8 m[IU]/mL
Hepatitis B Surface Ab: NONREACTIVE
Hepatitis B Surface Antigen w/Reflex: NONREACTIVE

## 2017-12-06 ENCOUNTER — Ambulatory Visit: Payer: Medicare HMO | Attending: Internal Medicine | Admitting: Unknown Physician Specialty

## 2017-12-06 ENCOUNTER — Other Ambulatory Visit (HOSPITAL_BASED_OUTPATIENT_CLINIC_OR_DEPARTMENT_OTHER): Payer: Self-pay | Admitting: Unknown Physician Specialty

## 2017-12-06 DIAGNOSIS — I480 Paroxysmal atrial fibrillation: Secondary | ICD-10-CM

## 2017-12-06 LAB — PROTHROMBIN TIME
Prothrombin INR: 3 — ABNORMAL HIGH (ref 0.8–1.3)
Prothrombin Time Patient: 32.1 s — ABNORMAL HIGH (ref 10.7–15.6)

## 2017-12-06 NOTE — Progress Notes (Signed)
Anticoag Treatment Plan  As of 12/06/2017    INR goal:   2.0-3.0   TTR:   81.6 % (4.3 y)   INR used for dosing:   3.0 (12/06/2017)   Full warfarin instructions:   5 mg every Sun, Tue, Thu; 7.5 mg all other days   Weekly warfarin total:   45 mg   Next INR check:   12/20/2017   Target end date:       Indications    Paroxysmal atrial fibrillation ([place]) [I48.0]             Anticoagulation Episode Summary     INR check location:       Send INR reminders to:   [facility] EDM CLINICAL SUPPORT STAFF POOL    Comments:               SUBJECTIVE    Patient Findings     Positives:   Change in health, Change in medications, Change in diet/appetite    Negatives:   Signs/symptoms of thrombosis, Signs/symptoms of bleeding, Laboratory test error suspected, Change in alcohol use, Change in activity, Upcoming invasive procedure, Emergency department visit, Upcoming dental procedure, Missed doses, Extra doses, Hospital admission, Bruising, Other complaints    Comments:   Has been taking Extra Strength Tylenol 650 mg, two tablets every 8 hours for the past week for arthritis and eating less greens          OBJECTIVE    Anticoag Visit Date 12/06/2017 11/17/2017 11/03/2017 10/11/2017 10/03/2017 09/02/2017 08/23/2017   INR Goal 2.0-3.0 2.0-3.0 2.0-3.0 2.0-3.0 2.0-3.0 2.0-3.0 2.0-3.0   Selected INR 3.0 2.4 1.9 2.1 1.8 2.1 1.9   INR Date 12/06/2017 11/17/2017 11/03/2017 10/11/2017 10/03/2017 09/02/2017 08/23/2017   Previous Week Total 45 mg 45 mg 42.5 mg 42.5 mg 40 mg 40 mg 40 mg   Pt Deviation No No No No No No No   Pt Findings Comments Has been taking Extra Strength Tylenol 650 mg, two tablets every 8 hours for the past week for arthritis and eating less greens - Took 5 mg instead of 7.5 mg one day this week.  - Patient reports she may have missed half a dose since she found half a pill on the floor. Pt received cortisone injection in her knee last week. Patient also using biofreeze cream for musculoskeletal pain at times.  Patient using walker now due to  left leg pain, referred to physical therapy.  Pt supposed to be having endometrial biopsy, not scheduled yet. Has been taking warfarin 7.5 mg MWF and 5 mg all other days. Pt to continue this dose and return for INR in   1 month.  Patient forgot and took 5 mg instead of 7.5 mg yesterday. She continues to eat greens daily and has been consistent with diet. No other findings.    Next Week Total 45 mg 45 mg 45 mg 42.5 mg 42.5 mg 40 mg 40 mg   Full Instructions 5 mg every Sun, Tue, Thu; 7.5 mg all other days 5 mg every Sun, Tue, Thu; 7.5 mg all other days 5 mg every Sun, Tue, Thu; 7.5 mg all other days 7.5 mg every Mon, Thu, Sat; 5 mg all other days 7.5 mg every Mon, Thu, Sat; 5 mg all other days 7.5 mg every Mon, Thu; 5 mg all other days 7.5 mg every Mon, Thu; 5 mg all other days   Date of Next INR 12/20/2017 12/08/2017 11/17/2017 10/25/2017 10/10/2017 09/30/2017 09/02/2017  ASSESSMENT     Therapeutic INR in stable patient and without complications.  Has been taking Extra Strength Tylenol 650 mg, two tablets every 8 hours for the past week for arthritis and has been eating less greens.  Has Rheumatology f/u on 12/14/17.  Advised patient to call  Rheumatologist to see if she can get a sooner appointment and regarding so much Tylenol use.  Patient will do this.      PLAN     1.  Continue Warfarin 7.5 mg x 4 days a week and 5 mg x 3 days a week (T/Th/Sun). Re check INR in two weeks.  Reviewed per MD protocol.        .  As of 12/06/2017    Warfarin maintenance plan:   5 mg every Sun, Tue, Thu; 7.5 mg all other days   Full warfarin instructions:   5 mg every Sun, Tue, Thu; 7.5 mg all other days   Weekly warfarin total:   45 mg   Next INR check:   12/20/2017         Anticoagulation Episode Summary     Comments:             2. Barb Shear acknowledged understanding of this plan      PATIENT INSTRUCTIONS    November 2019 Details    Sun Glori Luis Thu Fri Sat          1               2                 3                4               5               6               7               8               9                 10               11               12               13               14               15               16                 17               18               19      5  mg   See details      20      7.5 mg         21      5 mg         22      7.5 mg         23      7.5 mg  24      5 mg         25      7.5 mg         26      5 mg         27      7.5 mg         28      5 mg         29      7.5 mg         30      7.5 mg          Date Details   11/19 This INR check              December 2019 Details    Sun Mon Tue Wed Thu Fri Sat     1      5 mg         2      7.5 mg         3      5 mg         4               5               6               7                 8               9               10               11               12               13               14                 15               16               17               18               19               20               21                 22               23               24               25               26               27               28                 29                30  31                    Date Details   No additional details    Date of next INR:  12/20/2017

## 2017-12-07 ENCOUNTER — Encounter (HOSPITAL_BASED_OUTPATIENT_CLINIC_OR_DEPARTMENT_OTHER): Payer: Medicare HMO

## 2017-12-07 ENCOUNTER — Ambulatory Visit (INDEPENDENT_AMBULATORY_CARE_PROVIDER_SITE_OTHER): Payer: Medicare HMO | Admitting: Rheumatology

## 2017-12-07 VITALS — BP 110/67 | HR 74

## 2017-12-07 DIAGNOSIS — M0579 Rheumatoid arthritis with rheumatoid factor of multiple sites without organ or systems involvement: Secondary | ICD-10-CM

## 2017-12-07 MED ORDER — PREDNISONE 5 MG OR TABS
15.0000 mg | ORAL_TABLET | Freq: Every day | ORAL | 0 refills | Status: DC
Start: 2017-12-07 — End: 2018-01-05

## 2017-12-07 MED ORDER — METHOTREXATE SODIUM 2.5 MG OR TABS
ORAL_TABLET | ORAL | 3 refills | Status: DC
Start: 2017-12-07 — End: 2018-02-08

## 2017-12-07 MED ORDER — FOLIC ACID 1 MG OR TABS
1.0000 mg | ORAL_TABLET | Freq: Every day | ORAL | 3 refills | Status: DC
Start: 2017-12-07 — End: 2018-05-22

## 2017-12-07 NOTE — Progress Notes (Signed)
[place]  [address]  TEL: [phone]  l  FAX: [phone]    Follow Up Clinic Note  12/07/2017    IDENTIFYING DATA/CC:    Caitlin Wiley is a very pleasant 67 year old female who presents for follow up of rheumatoid arthritis.    RHEUMATOLOGIC HISTORY OF PRESENT ILLNESS:  Diagnosis: RA, OA; initial contult 11/30/2017  Presenting Symptoms/Timeline: 2013 joint pain hands, wrists, knees, shoulders  Autoimmune Serologies: RF 140, CCP 16; CRP 17, ESR 23; UA 4.6  Pertinent Radiology: Xrays of hands, wrists, knees, feet: No erosions, consistent with OA, chondrocalcinosis  Medications Used:  Significant Flares:  Immunization Status: Influenza, PCV13, PPSV23, Zoster  Infectious Disease Screening: Hepatitis B NR, Hepatitis C, TB quantiferon NR  Other: Afib on chronic anticoag,     INTERVAL HISTORY:  Patient returns for follow-up of rheumatoid arthritis.  She was last seen 1 week ago at which time we obtained labs and x-ray.  She is here to follow-up.    We discussed results of xrays and labs. No erosive disease on xrays but labs show elevated CRP/ESR.    REVIEW OF SYSTEMS:  Complete ROS is negative, except for those mentioned above in the Interval History    PROBLEM LIST:  Patient Active Problem List    Diagnosis Date Noted    Primary osteoarthritis of both knees [M17.0] 09/12/2017    Pain in both knees [M25.561, M25.562] 09/12/2017    Chronic anticoagulation [Z79.01] 08/02/2015    Right cervical radiculopathy [M54.12] 05/30/2015    Obesity [E66.9] 05/30/2015    Paroxysmal atrial fibrillation ([place]) [I48.0] 04/17/2014     A. S/p ablation ([place] [facility]) 10/19/2016      GERD (gastroesophageal reflux disease) [K21.9] 11/08/2012    OA (osteoarthritis) [M19.90] 07/28/2012    Asthma [J45.909] 07/24/2012    Hypercholesterolemia [E78.00] 07/24/2012    Edema [R60.9] 07/24/2012    Sleep apnea [G47.30] 07/24/2012    HTN (hypertension) [I10] 07/24/2012    Pulmonary nodule  [R91.1] 07/24/2012    Grief reaction [F43.21] 07/24/2012     05/11/2011         SOCIAL/FAMILY HISTORY:  Reviewed with patient. No changes from what is currently noted in EPIC    ALLERGIES:  Review of patient's allergies indicates:  Allergies   Allergen Reactions    Bee Venom Anaphylaxis    Ciprofloxacin Other     Joint aching    Pneumococcal Vaccines Hives    Sulfa Antibiotics Hives    Cats [Animals] Itching    Dust Mite Extract Itching     DUST    Pollen Extract Itching       MEDICATIONS:  Current Outpatient Medications   Medication Sig Dispense Refill    Acetaminophen (TYLENOL) 325 MG Oral Tab Take 325 mg by mouth every 6 hours as needed.      Cholecalciferol (VITAMIN D3 OR) Take by mouth.      Flaxseed, Linseed, (FLAXSEED OIL) 1200 MG Oral Cap None Entered      GLUCOSAMINE CHONDROITIN COMPLX OR       Magnesium 400 MG Oral Cap Take 400 mg by mouth.      Montelukast Sodium 10 MG Oral Tab Take 1 tablet (10 mg) by mouth daily. 90 tablet 3    Multiple Vitamins-Minerals (MULTIVITAMIN OR) no iron      Probiotic Product (PROBIOTIC DAILY OR)       raNITIdine HCl 150 MG Oral Tab  Take 1 tablet (150 mg) by mouth 2 times a day. 180 tablet 3    triamterene-hydrochlorothiazide 37.5-25 MG Oral Tab Take 1 tablet by mouth daily. 90 tablet 3    Unclassified (OTHER MEDS, SEE COMMENTS,) Curamin oral tab daily      VENTOLIN HFA 108 (90 Base) MCG/ACT inhaler INHALE ONE TO TWO PUFFS BY MOUTH EVERY 6 HOURS AS NEEDED FOR SHORTNESS OF BREATH /WHEEZING 18 g 3    warfarin (COUMADIN) 5 MG Oral Tab Take 1 tablet (5 mg) by mouth daily. 1 tablet daily or as directed 110 tablet 3     No current facility-administered medications for this visit.        PHYSICAL EXAMINATION:  Vital signs:  BP 110/67    Pulse 74    LMP  (LMP Unknown)   General:  Awake, alert, and oriented, no apparent distress, pleasant, and cooperative  Psychologic:  Mood is euthymic, affect is congruent  EYES: Anicteric sclera, no conjunctival injection  ENT:   Normocephalic, atraumatic, moist membranes, no oral ulcers   Pulmonary:  Non-labored breathing  Skin:  Normal temperature and texture, No visible rashes, ulcers, or nodules  Neurologic:  Light touch sensation is grossly intact. Face symmetric   Musculoskeletal:     Gait: Antalgic      LABS:    Reviewed    RADIOLOGY:   Reviewed.    DISEASE ACTIVITY:  Patient Function: 3  Patient Pain Assessment: 7.5  Patient Global Assessment: 5   Combined Rapid 3 Score: 15.5    ASSESSMENT AND PLAN:  Caitlin Wiley is a very pleasant 67 year old female who presents for follow up of rheumatoid arthritis.    #. Rheumatoid arthritis. Seropositive, nonerosive RA. We discussed starting treatment immediately to treat symptoms and prevent joint damage. I discussed the use of methotrexate, its indications for inflammatory arthritis, benefits (improved pain and function, preserved joint function) and risks (hepatotoxicity, myelosuppression, oral ulcers, hypersensitivity pneumonitis) with the patient and she elected to start this medicine. She understand it may be weeks to months before she notice benefit. Hence she would like to start prednisone for more immediate disease capture. We will start low dose but she understands that prednisone may have effect on her INR and she will need to let anti-coagulation know and monitor closely.   She expressed understanding and all her questions were answered.         - start methotrexate '15mg'$  once a week   - start folic acid '1mg'$  daily   - start prednisone '15mg'$  x10days then decrease by '5mg'$  every 5 days until off   - monitor INR closely    #. Immunization. She is due for second pneumonia booster and her influenza vaccine. I recommend that she get both these today and wait couple of days before starting her immune medications for best effect. She will also need to get shingles vaccine if not already done.    #. Bone health. Did not have time to discuss today.    I spent a total time of 30 minutes  face-to-face with the patient, of which more than 50% was spent counseling and coordinating care as outlined in this note.      All the patient's questions were answered. The patient will follow up in 8 weeks.    Dictated using Chief of Staff and electronically signed by Roxy Horseman, MD   Note: This document may contain errors inherent to this technology. If you find any errors that might  affect patient care, please contact our office as soon as possible.   [address], [address], [place], [place] [address]  Ph. [phone] Fx. [phone]

## 2017-12-07 NOTE — Patient Instructions (Addendum)
It was a pleasure seeing you in clinic today.     Start methotrexate as described. We will titrate up to 15mg  once a week. TAke with daily folic acid.   Please start prednisone 15mg  once a day x10 days then decrease by 5mg  every 5 days until off   Please get your vaccines prior to methotrexate, this is second pneumonia, influenza today then start your medications in 2 days   Follow up in 9-10 weeks    If you have any questions or concerns before your next appointment, please do not hesitate to contact me. Our clinic phone number is [phone] and I am also available via [facility].

## 2017-12-08 ENCOUNTER — Encounter (INDEPENDENT_AMBULATORY_CARE_PROVIDER_SITE_OTHER): Payer: Self-pay | Admitting: Family Medicine

## 2017-12-08 ENCOUNTER — Encounter (HOSPITAL_BASED_OUTPATIENT_CLINIC_OR_DEPARTMENT_OTHER): Payer: Medicare HMO | Admitting: Rehabilitative and Restorative Service Providers"

## 2017-12-08 NOTE — Telephone Encounter (Signed)
Vaccines updated in chart

## 2017-12-12 ENCOUNTER — Encounter (HOSPITAL_BASED_OUTPATIENT_CLINIC_OR_DEPARTMENT_OTHER): Payer: Medicare HMO | Admitting: Internal Medicine

## 2017-12-14 ENCOUNTER — Encounter (INDEPENDENT_AMBULATORY_CARE_PROVIDER_SITE_OTHER): Payer: Medicare HMO | Admitting: Rheumatology

## 2017-12-16 ENCOUNTER — Encounter (HOSPITAL_BASED_OUTPATIENT_CLINIC_OR_DEPARTMENT_OTHER): Payer: Medicare HMO | Admitting: Rehabilitative and Restorative Service Providers"

## 2017-12-19 ENCOUNTER — Encounter (HOSPITAL_BASED_OUTPATIENT_CLINIC_OR_DEPARTMENT_OTHER): Payer: Medicare HMO | Admitting: Internal Medicine

## 2017-12-19 ENCOUNTER — Encounter (HOSPITAL_BASED_OUTPATIENT_CLINIC_OR_DEPARTMENT_OTHER): Payer: Medicare HMO

## 2017-12-20 ENCOUNTER — Ambulatory Visit: Payer: Medicare HMO | Attending: Internal Medicine

## 2017-12-20 DIAGNOSIS — I480 Paroxysmal atrial fibrillation: Secondary | ICD-10-CM | POA: Insufficient documentation

## 2017-12-20 LAB — PR PROTHROMBIN TIME, ONSITE: prothrombin INR: 2.6

## 2017-12-20 NOTE — Progress Notes (Signed)
Anticoag Treatment Plan  As of 12/20/2017    INR goal:   2.0-3.0   TTR:   81.8 % (4.3 y)   INR used for dosing:   2.6 (12/20/2017)   Full warfarin instructions:   5 mg every Sun, Tue, Thu; 7.5 mg all other days   Weekly warfarin total:   45 mg   Next INR check:   01/03/2018   Target end date:       Indications    Paroxysmal atrial fibrillation ([place]) [I48.0]             Anticoagulation Episode Summary     INR check location:       Send INR reminders to:   [facility] EDM CLINICAL SUPPORT STAFF POOL    Comments:               SUBJECTIVE    Patient Findings     Positives:   Change in medications    Comments:   Prednisone and methotrexate per rheumatology - prednisone wean.          OBJECTIVE    Anticoag Visit Date 12/20/2017 12/06/2017 11/17/2017 11/03/2017 10/11/2017 10/03/2017 09/02/2017   INR Goal 2.0-3.0 2.0-3.0 2.0-3.0 2.0-3.0 2.0-3.0 2.0-3.0 2.0-3.0   Selected INR 2.6 3.0 2.4 1.9 2.1 1.8 2.1   INR Date 12/20/2017 12/06/2017 11/17/2017 11/03/2017 10/11/2017 10/03/2017 09/02/2017   Previous Week Total 45 mg 45 mg 45 mg 42.5 mg 42.5 mg 40 mg 40 mg   Pt Deviation No No No No No No No   Pt Findings Comments Prednisone and methotrexate per rheumatology - prednisone wean. Has been taking Extra Strength Tylenol 650 mg, two tablets every 8 hours for the past week for arthritis and eating less greens - Took 5 mg instead of 7.5 mg one day this week.  - Patient reports she may have missed half a dose since she found half a pill on the floor. Pt received cortisone injection in her knee last week. Patient also using biofreeze cream for musculoskeletal pain at times.  Patient using walker now due to left leg pain, referred to physical therapy.  Pt supposed to be having endometrial biopsy, not scheduled yet. Has been taking warfarin 7.5 mg MWF and 5 mg all other days. Pt to continue this dose and return for INR in   1 month.    Next Week Total 45 mg 45 mg 45 mg 45 mg 42.5 mg 42.5 mg 40 mg   Full Instructions 5 mg every Sun, Tue, Thu; 7.5 mg all  other days 5 mg every Sun, Tue, Thu; 7.5 mg all other days 5 mg every Sun, Tue, Thu; 7.5 mg all other days 5 mg every Sun, Tue, Thu; 7.5 mg all other days 7.5 mg every Mon, Thu, Sat; 5 mg all other days 7.5 mg every Mon, Thu, Sat; 5 mg all other days 7.5 mg every Mon, Thu; 5 mg all other days   Date of Next INR 01/03/2018 12/20/2017 12/08/2017 11/17/2017 10/25/2017 10/10/2017 09/30/2017       ASSESSMENT    Therapeutic INR in stable patient and without complications.     PLAN    1.  Continue Warfarin at current dose of 5 mg on T/Th/sun and 7.5 mg other days of the week. Much less joint pain now - has been taking prednisone (tapering) and methotrexate for the past 10 days. Recheck in 2 weeks when establishing care with Dr Nicole Kindred.  .  As of 12/20/2017    Warfarin  maintenance plan:   5 mg every Sun, Tue, Thu; 7.5 mg all other days   Full warfarin instructions:   5 mg every Sun, Tue, Thu; 7.5 mg all other days   Weekly warfarin total:   45 mg   No change documented:   Caitlin Flake, RN   Next INR check:   01/03/2018         Anticoagulation Episode Summary     Comments:             2. Caitlin Wiley acknowledged understanding of this plan      PATIENT INSTRUCTIONS    December 2019 Details    Sun Glori Luis Thu Fri Sat     1               2               3      5  mg   See details      4      7.5 mg         5      5 mg         6      7.5 mg         7      7.5 mg           8      5 mg         9      7.5 mg         10      5 mg         11      7.5 mg         12      5 mg         13      7.5 mg         14      7.5 mg           15      5 mg         16      7.5 mg         17      5 mg         18               19               20               21                 22               23               24               25               26               27               28                 29               30                31  Date Details   12/03 This INR check       Date of next INR:  01/03/2018

## 2017-12-21 ENCOUNTER — Telehealth (HOSPITAL_BASED_OUTPATIENT_CLINIC_OR_DEPARTMENT_OTHER): Payer: Self-pay | Admitting: Rehabilitative and Restorative Service Providers"

## 2017-12-21 NOTE — Telephone Encounter (Signed)
[facility] and confirm 12/6 evaluation appointment. Informed patient of cancellation and no show policy.

## 2017-12-22 ENCOUNTER — Telehealth (HOSPITAL_BASED_OUTPATIENT_CLINIC_OR_DEPARTMENT_OTHER): Payer: Self-pay | Admitting: Rehabilitative and Restorative Service Providers"

## 2017-12-22 NOTE — Telephone Encounter (Signed)
Received vm from patient confirming 12/23/17 appointment with Nevin Bloodgood at 1:30.

## 2017-12-23 ENCOUNTER — Ambulatory Visit: Payer: Medicare HMO | Attending: Nurse Practitioner | Admitting: Rehabilitative and Restorative Service Providers"

## 2017-12-23 DIAGNOSIS — M19049 Primary osteoarthritis, unspecified hand: Secondary | ICD-10-CM | POA: Insufficient documentation

## 2017-12-23 DIAGNOSIS — M79644 Pain in right finger(s): Secondary | ICD-10-CM

## 2017-12-23 DIAGNOSIS — M25641 Stiffness of right hand, not elsewhere classified: Secondary | ICD-10-CM

## 2017-12-23 NOTE — Progress Notes (Signed)
HAND THERAPY INITIAL EVALUATION      This note serves as CMS evaluation form that requires the referring provider to certify the need for therapy services furnished under the Plan of Treatment. The signing provider is certifying the plan of care.      GENERAL VISIT INFO  Encounter Diagnoses   Name Primary?   . Hand arthritis Yes   . Pain of right middle finger    . Stiffness of finger joint of right hand      Referring Provider: Ronald Lobo, ARNP    Certification From*: 82/50/53  Certification To*: 97/67/34  Date of Symptom Onset*: 11/01/17  Start of Care Date*: 12/23/17  Reason for Referral*: 67 yo female wtih limited ROM of right MF at DIP adn PIP joints, likely arthritis. No history of trauma. She uses a walker and cane for mobility which may be aggravating. Please evalaute and treat.            VISIT COUNT    1       EVALUATION       General Assessment  Certification From*: 19/37/90  Certification To*: 24/09/73  Date of Symptom Onset*: 11/01/17  Start of Care Date*: 12/23/17  Reason for Referral*: 67 yo female wtih limited ROM of right MF at DIP adn PIP joints, likely arthritis. No history of trauma. She uses a walker and cane for mobility which may be aggravating. Please evalaute and treat.  Precautions: none  Pain Score: 2  Aggravating Factors: writing, drawing, driving, general use of her hand  Relieving Factors: rest  Patient Outcome Tool: QuickDash  Outcome Score: 30   Based on screening, patient's fall risk is moderate.  5HG for longer distances, cane for shorter distances , wall walks when at home.     MECHANISM OF INJURY    Progressive, no trauma.     PRIOR FUNCTION      Prior Level of Function  Level of Independence: Independent with ADLs and functional transfers;Independent with homemaking with ambulation  Ambulation: Modified independent  Vocational: Retired      Past Medical History:   Diagnosis Date   . Anxiety    . Cervical disc disease    . GERD (gastroesophageal reflux disease)    .  Leukocytosis    . Muscle cramps 04/2011   . Osteoarthritis     chronic   . Pain of toe of right foot 02/2010    5th toe          SUBJECTIVE    Pain: She started methotrexate 2 weeks ago which has been helping to decrease her pain in general. She also is having right thumb MP pain.    EXAMINATION/OBJECTIVE      ROM:         DIGIT ROM:  AROM    Pre/Post treatment        MP PIP DIP Comment   R Index finger       R Middle finger       R Ring finger 80/100 2-98/100 66/78    R Small finger       L Index finger       L Middle finger       L Ring finger       L Small finger         Composite Flexion/ Fist, tip to [facility]: NT    Sensation:   Pt reports normal sensation. Light touch to all fingertips normal.  Integumentary:  Normal     Edema:  MF P1 - Right 6.4, Left 5.9    Palpation:  No TTP    Dexterity/Coordination:  Decreased    Strength:   Right HD     Right Left Comments   Grip 30 45    Pinch Lat   Pinches NT due to thumb pn   Pinch 3 pt        Special Tests: None    TREATMENT    Initial evaluation completed.  Instructed in HEP including-  Heat application to R hand.  Gentle AROM including MF IP blocking and TGE's.  Written instructions given.  Pt reports right thumb MP pain that is currently limiting her function quite a bit.  Discussed orthosis options to decrease joint inflammation.  Fabricated right hand based thumbs spica orthosis mainly to wear at night and periodically during the day to help reduce thumb MP pain.  Instructed in wear and care for orthosis.    EDUCATION    Education  Primary Learner: Patient  Preferred Language: English  Education Preference: Verbal;Reading/Handout;Demonstration  Method of Education: Handout;Demonstration;Verbal  Response to Education: Demonstrates independently  Barriers to Education: None  Cultural practices that influence care: None    Educated in pertinent anatomy and anticipated progression through therapy.    ASSESSMENT   Pt is a right hand dominant female who is currently not  working. She developed right MF pain and stiffness 7 weeks ago without trauma and was diagnosed with arthritis. She also reports right thumb MP pain that is limiting the function of her right dominant hand. She will benefit from a course of hand therapy for heat modalities, edema control, A/PROM, custom orthosis fabrication, strengthening prn, joint protection/adaptive equipment. She has a history of bilateral knee OA and is anticipating TKR in the coming months.    GOALS      Therapy Goals  Patient Goals: Decrease pain with hand use.  Potential Barriers to Achieving Goals: None Defined    STG: 2 weeks  (01/06/18)  Independent with HEP.  Independent with use of orthosis.    LTG: 6 weeks  (02/09/18)  No longer waking at night with pain.  Pt to carry a grocery bag in her right hand from the car to the house.  Pt to open a jar with adaptive equipment.  Pt to draw x 15 min without increase in pain.    Patient requires skilled therapy to restore prior level of function utilizing the treatment and modalities described in this plan of care.     Rehab Potential: Good     PLAN       Planned Intervention(s)*: Patient Education;Occupational Therapy Evaluation;Paraffin;Therapeutic Exercise;Manual Therapy Techniques         Therapy Review Date: 02/09/17    Therapy Frequency: 1/wk      Next visit: Adjust orthosis. Review HEP. Address MF edema. Joint protection techniques.      Thank you for this referral.    Joan Mayans, OTR/L, CLT, CHT

## 2017-12-23 NOTE — Telephone Encounter (Signed)
Received vm from patient confirming 12/23/17 appointment with Nevin Bloodgood at 1:30.

## 2017-12-28 ENCOUNTER — Other Ambulatory Visit (INDEPENDENT_AMBULATORY_CARE_PROVIDER_SITE_OTHER): Payer: Self-pay | Admitting: Family Medicine

## 2017-12-28 DIAGNOSIS — R600 Localized edema: Secondary | ICD-10-CM

## 2017-12-28 DIAGNOSIS — J4520 Mild intermittent asthma, uncomplicated: Secondary | ICD-10-CM

## 2017-12-28 DIAGNOSIS — R0602 Shortness of breath: Secondary | ICD-10-CM

## 2017-12-29 MED ORDER — MONTELUKAST SODIUM 10 MG OR TABS
ORAL_TABLET | ORAL | 0 refills | Status: DC
Start: 2017-12-29 — End: 2018-08-21

## 2017-12-29 MED ORDER — VENTOLIN HFA 108 (90 BASE) MCG/ACT IN AERS
INHALATION_SPRAY | RESPIRATORY_TRACT | 2 refills | Status: DC
Start: 2017-12-29 — End: 2019-02-13

## 2017-12-29 MED ORDER — TRIAMTERENE-HCTZ 37.5-25 MG OR TABS
ORAL_TABLET | ORAL | 0 refills | Status: DC
Start: 2017-12-29 — End: 2018-10-13

## 2018-01-05 ENCOUNTER — Ambulatory Visit: Payer: Medicare HMO | Attending: Cardiovascular Disease | Admitting: Cardiovascular Disease

## 2018-01-05 ENCOUNTER — Ambulatory Visit (HOSPITAL_BASED_OUTPATIENT_CLINIC_OR_DEPARTMENT_OTHER): Payer: Medicare HMO

## 2018-01-05 VITALS — BP 109/62 | Ht 65.98 in | Wt 264.0 lb

## 2018-01-05 DIAGNOSIS — I480 Paroxysmal atrial fibrillation: Secondary | ICD-10-CM

## 2018-01-05 DIAGNOSIS — I10 Essential (primary) hypertension: Secondary | ICD-10-CM | POA: Insufficient documentation

## 2018-01-05 DIAGNOSIS — Z6841 Body Mass Index (BMI) 40.0 and over, adult: Secondary | ICD-10-CM

## 2018-01-05 LAB — PR PROTHROMBIN TIME, ONSITE: prothrombin INR: 2.3

## 2018-01-05 MED ORDER — APIXABAN 5 MG OR TABS
5.0000 mg | ORAL_TABLET | Freq: Two times a day (BID) | ORAL | 3 refills | Status: DC
Start: 2018-01-05 — End: 2018-12-18

## 2018-01-05 NOTE — Patient Instructions (Signed)
We discussed a stroke risk of 3% per year without anticoagulation and in atrial fibrillation.  Your stroke risk is lower given only short episodes of atrial fibrillation, but we agreed to continue anticoagulation given the bad outcomes with stroke.    We discussed switching to Eliquis (apixaban) given lower risk of stroke and bleeding compared to warfarin.  Please check out the cost before switching, and you will need to hold warfarin for 1-2 days and come in for a protime before starting apixaban.     Your exam today is normal including blood pressure.  Keep as active as you can, think about getting back in the pool.     Let us know if you have symptomatic atrial fibrillation episodes or bleeding.

## 2018-01-05 NOTE — Progress Notes (Signed)
Anticoag Treatment Plan  As of 01/05/2018    INR goal:   2.0-3.0   TTR:   82.0 % (4.3 y)   INR used for dosing:   2.3 (01/05/2018)   Full warfarin instructions:   5 mg every Sun, Tue, Thu; 7.5 mg all other days   Weekly warfarin total:   45 mg   Next INR check:   02/02/2018   Target end date:       Indications    Paroxysmal atrial fibrillation ([place]) [I48.0]             Anticoagulation Episode Summary     INR check location:       Send INR reminders to:   [facility] EDM CLINICAL SUPPORT STAFF POOL    Comments:               SUBJECTIVE    Patient Findings     Negatives:   Signs/symptoms of thrombosis, Signs/symptoms of bleeding, Laboratory test error suspected, Change in health, Change in alcohol use, Change in activity, Upcoming invasive procedure, Emergency department visit, Upcoming dental procedure, Missed doses, Extra doses, Change in medications, Change in diet/appetite, Hospital admission, Bruising, Other complaints          OBJECTIVE    Anticoag Visit Date 01/05/2018 12/20/2017 12/06/2017 11/17/2017 11/03/2017 10/11/2017 10/03/2017   INR Goal 2.0-3.0 2.0-3.0 2.0-3.0 2.0-3.0 2.0-3.0 2.0-3.0 2.0-3.0   Selected INR 2.3 2.6 3.0 2.4 1.9 2.1 1.8   INR Date 01/05/2018 12/20/2017 12/06/2017 11/17/2017 11/03/2017 10/11/2017 10/03/2017   Previous Week Total 45 mg 45 mg 45 mg 45 mg 42.5 mg 42.5 mg 40 mg   Pt Deviation No No No No No No No   Pt Findings Comments - Prednisone and methotrexate per rheumatology - prednisone wean. Has been taking Extra Strength Tylenol 650 mg, two tablets every 8 hours for the past week for arthritis and eating less greens - Took 5 mg instead of 7.5 mg one day this week.  - Patient reports she may have missed half a dose since she found half a pill on the floor. Pt received cortisone injection in her knee last week. Patient also using biofreeze cream for musculoskeletal pain at times.    Next Week Total 45 mg 45 mg 45 mg 45 mg 45 mg 42.5 mg 42.5 mg   Full Instructions 5 mg every Sun, Tue, Thu; 7.5 mg all  other days 5 mg every Sun, Tue, Thu; 7.5 mg all other days 5 mg every Sun, Tue, Thu; 7.5 mg all other days 5 mg every Sun, Tue, Thu; 7.5 mg all other days 5 mg every Sun, Tue, Thu; 7.5 mg all other days 7.5 mg every Mon, Thu, Sat; 5 mg all other days 7.5 mg every Mon, Thu, Sat; 5 mg all other days   Date of Next INR 02/02/2018 01/03/2018 12/20/2017 12/08/2017 11/17/2017 10/25/2017 10/10/2017       ASSESSMENT  INR 2.3  Therapeutic INR in stable patient and without complications. Reviewed per MD protocol.     PLAN    1.  Continue Warfarin 7.5 mg x 4 days a week and 5 mg x 3 days a week (T/Th/Sun). Recheck 1 month.   .  As of 01/05/2018    Warfarin maintenance plan:   5 mg every Sun, Tue, Thu; 7.5 mg all other days   Full warfarin instructions:   5 mg every Sun, Tue, Thu; 7.5 mg all other days   Weekly warfarin total:   45 mg  No change documented:   Jaedin Regina, Virl Cagey, RN   Next INR check:   02/02/2018         Anticoagulation Episode Summary     Comments:             2. Janelis Stelzer acknowledged understanding of this plan      PATIENT INSTRUCTIONS    December 2019 Details    Sun Glori Luis Thu Fri Sat     1               2               3               4               5               6               7                 8               9               10               11               12               13               14                 15               16               17               18               19      5  mg   See details      20      7.5 mg         21      7.5 mg           22      5 mg         23      7.5 mg         24      5 mg         25      7.5 mg         26      5 mg         27      7.5 mg         28      7.5 mg           29      5 mg         30      7.5 mg         31      5 mg              Date Details   12/19 This INR check              January 2020 Details    Sun  Mon Tue Wed Thu Fri Sat        1      7.5 mg         2      5 mg         3      7.5 mg         4      7.5 mg           5      5 mg          6      7.5 mg         7      5 mg         8      7.5 mg         9      5 mg         10      7.5 mg         11      7.5 mg           12      5 mg         13      7.5 mg         14      5 mg         15      7.5 mg         16      5 mg         17               18                 19               20               21               22               23               24               25                 26               27               28               29               30               31                  Date Details   No additional details    Date of next INR:  02/02/2018

## 2018-01-05 NOTE — Progress Notes (Signed)
Caitlin Wiley is here for management of paroxysmal atrial fibrillation.  Additional problems include hypertension, sleep apnea on CPAP therapy, and a recent diagnosis of rheumatoid arthritis in addition to osteoarthritis.  She underwent catheter ablation for atrial fibrillation in October 2018.  Her electrophysiologist is Dr. Michaelene Song.  Prior to ablation she describes 2 cardioversions, and she was treated with sotalol until about December 2018.  We reviewed a 30 day event monitor from September and October of this year.  She had several short episodes of atrial fibrillation lasting up to 17 seconds or less.  She has a chads 2 Vasc score of 3 currently anticoagulated, and she has been interested in discontinuing anticoagulation if possible.    She reports good control of hypertension.  She is in a weight loss program and currently has lost about 60 pounds!  Physical activity is quite limited because of bilateral knee osteoarthritis, and she describes future plans for bilateral total knee replacement.  Her goal weight is less than 230 pounds.  She sleeps well with CPAP.  She reports only occasional palpitations lasting 1 to 2 seconds and denies any recurrent episodes of atrial fibrillation.  She denies TIA symptoms.  She has noted occasional vaginal bleeding which has been attributed to a cervical polyp status post negative biopsy by her report.  She denies any other significant bleeding but does note bruising associated with trauma.      Patient Active Problem List   Diagnosis   . Asthma   . Hypercholesterolemia   . Edema   . Sleep apnea   . HTN (hypertension)   . Pulmonary nodule   . Grief reaction   . OA (osteoarthritis)   . GERD (gastroesophageal reflux disease)   . Paroxysmal atrial fibrillation (HCC)   . Right cervical radiculopathy   . Obesity   . Chronic anticoagulation   . Primary osteoarthritis of both knees   . Pain in both knees     Social history: She lives in [place] about 4 blocks from the clinic.  She  lives alone.  She is a nonsmoker.    Review of patient's allergies indicates:  Allergies   Allergen Reactions   . Bee Venom Anaphylaxis   . Ciprofloxacin Other     Joint aching   . Pneumococcal Vaccines Hives   . Sulfa Antibiotics Hives   . Cats [Animals] Itching   . Dust Mite Extract Itching     DUST   . Pollen Extract Itching     Outpatient Medications Prior to Visit   Medication Sig Dispense Refill   . Acetaminophen (TYLENOL) 325 MG Oral Tab Take 325 mg by mouth every 6 hours as needed.     . Cholecalciferol (VITAMIN D3 OR) Take by mouth.     . Flaxseed, Linseed, (FLAXSEED OIL) 1200 MG Oral Cap None Entered     . folic acid 1 MG tablet Take 1 tablet (1 mg) by mouth daily. 90 tablet 3   . GLUCOSAMINE CHONDROITIN COMPLX OR      . Magnesium 400 MG Oral Cap Take 400 mg by mouth.     . methotrexate 2.5 MG tablet Take methotrexate weekly. Week 1: take 3 tablets (7.5mg ), Week 2: take 5 tablet (12.5mg ), Week 3 and after: take 6 tablets (15mg ) 24 tablet 3   . MONTELUKAST 10 MG tablet TAKE ONE TABLET BY MOUTH ONCE DAILY 90 tablet 0   . Multiple Vitamins-Minerals (MULTIVITAMIN OR) no iron     . predniSONE 5 MG  tablet Take 3 tablets (15 mg) by mouth daily. 45 tablet 0   . Probiotic Product (PROBIOTIC DAILY OR)      . raNITIdine HCl 150 MG Oral Tab Take 1 tablet (150 mg) by mouth 2 times a day. 180 tablet 3   . TRIAMTERENE-HYDROCHLOROTHIAZIDE 37.5-25 MG tablet TAKE ONE TABLET BY MOUTH ONCE DAILY 90 tablet 0   . Unclassified (OTHER MEDS, SEE COMMENTS,) Curamin oral tab daily     . VENTOLIN HFA 108 (90 Base) MCG/ACT inhaler INHALE ONE TO TWO PUFFS BY MOUTH EVERY 6 HOURS AS NEEDED FOR SHORTNESS OF BREATH /WHEEZING 18 g 2   . warfarin (COUMADIN) 5 MG Oral Tab Take 1 tablet (5 mg) by mouth daily. 1 tablet daily or as directed 110 tablet 3     No facility-administered medications prior to visit.      BP 109/62   Ht 5' 5.98" (1.676 m)   Wt (!) 264 lb (119.7 kg)   LMP  (LMP Unknown)   BMI 42.63 kg/m   She is alert and  comfortable.  Neck without adenopathy.  No carotid delay or bruit.  Jugular venous pressure is not elevated.  Lungs are clear.  Cardiac exam reveals a regular rhythm and no murmur or gallop.  Abdomen soft and nontender without hepatomegaly.  She has no peripheral edema and pedal pulses are 2+.    ECG: Tracing in September showed sinus rhythm at 79 bpm with minor ST-T changes in the anterior leads.    Laboratories: From last month comprehensive metabolic panel normal as well as a CBC.    A/P:  (I48.0) Paroxysmal atrial fibrillation (HCC)  (primary encounter diagnosis)  She has had no symptomatic episodes of atrial fibrillation but the 30 day monitor did show several short episodes lasting 17 seconds or less.  The duration of atrial fibrillation does seem to matter for determination of stroke risk, her stroke risk would be lower with only short episodes of atrial fibrillation compared to a patient with prolonged paroxysms or chronic atrial fibrillation.  She has the option to discontinue anticoagulation at this time, but my conservative recommendation is to continue anticoagulation in large part because stroke can be such a devastating outcome.  We agreed to continue anticoagulation at this time, but she will change from warfarin to Eliquis 5 mg twice daily if affordable.  She will talk to her pharmacist about the cost before making a final decision.  We discussed starting Eliquis once her INR falls below 2.0.  See patient instructions.  She is to report symptomatic episodes of atrial fibrillation or any bleeding.    Plan: apixaban (Eliquis) 5 MG tablet           (I10) Essential hypertension  Blood pressures well controlled continue present therapy

## 2018-01-06 ENCOUNTER — Encounter (HOSPITAL_BASED_OUTPATIENT_CLINIC_OR_DEPARTMENT_OTHER): Payer: Medicare HMO | Admitting: Rehabilitative and Restorative Service Providers"

## 2018-01-06 ENCOUNTER — Telehealth (HOSPITAL_BASED_OUTPATIENT_CLINIC_OR_DEPARTMENT_OTHER): Payer: Self-pay | Admitting: Cardiovascular Disease

## 2018-01-06 NOTE — Telephone Encounter (Signed)
Patient called.  She needs to speak to a nurse about going off of warfarin and switching to eliquis. Please call patient and advise.    Molly Maduro, CMA

## 2018-01-06 NOTE — Telephone Encounter (Signed)
Called patient back and she states she can afford the Eliquis which will be $80 a month for her for 2 doses a day.  She wants to start right away but since we need her INR to be low 2.0 before starting the Eliquis.  On 01/05/18 her INR was 2.3, her normal dose for today through Sunday is 7.5 mg.  She wanted to drop to 5 mg instead for the weekend.  We will call back on Monday and have her come in for INR check one last time before starting Eliquis.  Patient understood the plan.

## 2018-01-09 ENCOUNTER — Telehealth (INDEPENDENT_AMBULATORY_CARE_PROVIDER_SITE_OTHER): Payer: Self-pay | Admitting: Family Medicine

## 2018-01-09 ENCOUNTER — Telehealth (HOSPITAL_BASED_OUTPATIENT_CLINIC_OR_DEPARTMENT_OTHER): Payer: Self-pay | Admitting: Cardiovascular Disease

## 2018-01-09 ENCOUNTER — Ambulatory Visit: Payer: Medicare HMO | Attending: Cardiovascular Disease

## 2018-01-09 DIAGNOSIS — L719 Rosacea, unspecified: Secondary | ICD-10-CM

## 2018-01-09 DIAGNOSIS — I480 Paroxysmal atrial fibrillation: Secondary | ICD-10-CM | POA: Insufficient documentation

## 2018-01-09 LAB — PR PROTHROMBIN TIME, ONSITE: prothrombin INR: 2

## 2018-01-09 NOTE — Progress Notes (Signed)
Pt will be transitioning to apixaban from Warfarin. INR 2.0 today after 5 mg the past 2 days instead of 7.5 mg. Will have patient stop Warfarin today and start Apixaban when recommended by Dr Nicole Kindred.  Sallye Ober,  RN  [place]

## 2018-01-09 NOTE — Telephone Encounter (Signed)
Pt will be transitioning to apixaban from Warfarin. INR 2.0 today after 5 mg the past 2 days instead of 7.5 mg. Will have patient stop Warfarin today and start Apixaban when recommended by Dr Nicole Kindred.  Sallye Ober,  RN  [place]

## 2018-01-09 NOTE — Telephone Encounter (Signed)
Done. Please notify patient.

## 2018-01-09 NOTE — Telephone Encounter (Signed)
Pt informed that referral has been signed     Referral faxed to # below     Confirmation received       Your fax has been successfully sent to referral  at [phone].  ------------------------------------------------------------  From: Daisy Floro  ------------------------------------------------------------  01/09/2018 2:31:41 PM Transmission Record   Sent to [phone] with remote ID ""   Result: (0/339;0/0) Successful Send   Page record: 1 - 3   Elapsed time: 02:19 on channel 14

## 2018-01-09 NOTE — Telephone Encounter (Signed)
Called patient back this morning.  She stayed on 5 mg of warfarin over the weekend.  She will come in today for INR check.

## 2018-01-09 NOTE — Telephone Encounter (Signed)
INR will be less than 2 tomorrow, so she can start apixaban tomorrow.

## 2018-01-09 NOTE — Telephone Encounter (Signed)
Pt is requesting a referral to see [facility] Savage). Pt would like to be seen for skin check and possible rosacea. Referral pending review. Thank you     Ph: [phone]  Fax: [phone]

## 2018-01-09 NOTE — Telephone Encounter (Addendum)
Pt notified via e-care and also LM  Sallye Ober,  RN  [place]

## 2018-01-12 ENCOUNTER — Telehealth (INDEPENDENT_AMBULATORY_CARE_PROVIDER_SITE_OTHER): Payer: Self-pay | Admitting: Family Medicine

## 2018-01-12 NOTE — Telephone Encounter (Signed)
Referral still pending with [facility]

## 2018-01-12 NOTE — Telephone Encounter (Signed)
RETURN CALL: Voicemail - Detailed Message      SUBJECT:  General Message     MESSAGE: Odessa Fleming listed above called regarding patient's Dermatology Med Referral [phone]. Larence Penning is requesting we fax her the authorization @ [phone] when we get it back from patient's insurance company.

## 2018-01-13 NOTE — Telephone Encounter (Signed)
Referral and auth faxed to [facility] at [phone]

## 2018-01-20 ENCOUNTER — Ambulatory Visit: Payer: Medicare HMO | Attending: Nurse Practitioner | Admitting: Rehabilitative and Restorative Service Providers"

## 2018-01-20 DIAGNOSIS — M79644 Pain in right finger(s): Secondary | ICD-10-CM | POA: Insufficient documentation

## 2018-01-20 DIAGNOSIS — M19049 Primary osteoarthritis, unspecified hand: Secondary | ICD-10-CM | POA: Insufficient documentation

## 2018-01-20 DIAGNOSIS — M79645 Pain in left finger(s): Secondary | ICD-10-CM | POA: Insufficient documentation

## 2018-01-20 DIAGNOSIS — M25641 Stiffness of right hand, not elsewhere classified: Secondary | ICD-10-CM | POA: Insufficient documentation

## 2018-01-20 NOTE — Progress Notes (Signed)
HAND THERAPY DAILY NOTE          GENERAL VISIT INFO  Encounter Diagnoses   Name Primary?   . Hand arthritis Yes   . Pain of right middle finger    . Stiffness of finger joint of right hand      Referring Provider: Ronald Lobo, ARNP    Certification From*: 07/13/92  Certification To*: 85/46/27  Date of Symptom Onset*: 11/01/17  Start of Care Date*: 12/23/17  Reason for Referral*: 68 yo female wtih limited ROM of right MF at DIP and PIP joints, likely arthritis. No history of trauma. She uses a walker and cane for mobility which may be aggravating. Please evalaute and treat.            VISIT COUNT    2       EVALUATION       General Assessment  Certification From*: 03/50/09  Certification To*: 38/18/29  Date of Symptom Onset*: 11/01/17  Start of Care Date*: 12/23/17  Reason for Referral*: 68 yo female wtih limited ROM of right MF at DIP and PIP joints, likely arthritis. No history of trauma. She uses a walker and cane for mobility which may be aggravating. Please evalaute and treat.  Precautions: none  Pain Score: 2  Aggravating Factors: writing, drawing, driving, general use of her hand  Relieving Factors: rest  Patient Outcome Tool: QuickDash  Outcome Score: 30  Have you fallen in the past year?: No  Are you afraid of falling?: No  Issues with walking/balance/feeling unsteady: No  Fall Screen Date: 12/23/17   Based on screening, patient's fall risk is moderate.  9BZ for longer distances, cane for shorter distances , wall walks when at home.     MECHANISM OF INJURY    Progressive, no trauma.     PRIOR FUNCTION      Prior Level of Function  Level of Independence: Independent with ADLs and functional transfers;Independent with homemaking with ambulation  Ambulation: Modified independent  Vocational: Retired      Past Medical History:   Diagnosis Date   . Anxiety    . Cervical disc disease    . GERD (gastroesophageal reflux disease)    . Leukocytosis    . Muscle cramps 04/2011   . Osteoarthritis     chronic   .  Pain of toe of right foot 02/2010    5th toe          SUBJECTIVE    Pain:     EXAMINATION/OBJECTIVE      ROM:         DIGIT ROM:  AROM    Pre/Post treatment        MP PIP DIP Comment   R Index finger       R Middle finger 85/100 2-105/100 72/78    R Ring finger       R Small finger       L Index finger       L Middle finger       L Ring finger       L Small finger         Composite Flexion/ Fist, tip to [facility]: NT    Sensation:   Pt reports normal sensation. Light touch to all fingertips normal.     Integumentary:  Normal     Edema:  MF P1 - Right 6.4, Left 5.9    Palpation:  No TTP    Dexterity/Coordination:  Decreased  Strength:   Right HD     Right Left Comments   Grip 30 45    Pinch Lat   Pinches NT due to thumb pn   Pinch 3 pt        Special Tests: None    TREATMENT    MHP to right hand x 10 min  Edema massage to MF/hand followed by TGE's.  Issued size L digisleeve to assist with MF edema.  Educated pt regarding pertinent anatomy and what happens to the joint with arthritis causing pain.  Pt had bought compression gloves online and feels they have been helpful.  Trialed use of Pen Again and issued cylindrical foam primarily for use on pen for drawing.  Issued Camera operator and discussed adaptive equipment specifically scissors and kitchen knives in neutral position.  Repair to orthosis.    EDUCATION    Education  Primary Learner: Patient  Preferred Language: English  Education Preference: Verbal;Reading/Handout;Demonstration  Method of Education: Handout;Demonstration;Verbal  Response to Education: Demonstrates independently  Barriers to Education: None  Cultural practices that influence care: None    Educated in pertinent anatomy and anticipated progression through therapy.    ASSESSMENT   Pt is a right hand dominant female who is currently not working. She developed right MF pain and stiffness 7 weeks ago without trauma and was diagnosed with arthritis. She also reports right thumb MP pain that is limiting  the function of her right dominant hand. She will benefit from a course of hand therapy for heat modalities, edema control, A/PROM, custom orthosis fabrication, strengthening prn, joint protection/adaptive equipment. She has a history of bilateral knee OA and is anticipating TKR in the coming months. Thumb pain somewhat better with use of custom orthosis at night.    GOALS      Therapy Goals  Patient Goals: Decrease pain with hand use.  Potential Barriers to Achieving Goals: None Defined    STG: 2 weeks  (01/06/18)  Independent with HEP.   MET  Independent with use of orthosis.   MET    LTG: 6 weeks  (02/09/18)  No longer waking at night with pain.  Pt to carry a grocery bag in her right hand from the car to the house.  Pt to open a jar with adaptive equipment.  Pt to draw x 15 min without increase in pain.    Patient requires skilled therapy to restore prior level of function utilizing the treatment and modalities described in this plan of care.     Rehab Potential: Good     PLAN       Planned Intervention(s)*: Patient Education;Occupational Therapy Evaluation;Paraffin;Therapeutic Exercise;Manual Therapy Techniques         Therapy Review Date: 02/09/17    Therapy Frequency: 1/wk      Next visit: Adjust orthosis. Review HEP. Address MF edema. Joint protection techniques.  Next visit - paraffin, kinesiotape for thumb MP joint      Thank you for this referral.    Joan Mayans, OTR/L, CLT, CHT

## 2018-01-27 ENCOUNTER — Encounter (HOSPITAL_BASED_OUTPATIENT_CLINIC_OR_DEPARTMENT_OTHER): Payer: Medicare HMO | Admitting: Rehabilitative and Restorative Service Providers"

## 2018-01-27 ENCOUNTER — Ambulatory Visit (HOSPITAL_BASED_OUTPATIENT_CLINIC_OR_DEPARTMENT_OTHER): Payer: Medicare HMO | Admitting: Rehabilitative and Restorative Service Providers"

## 2018-01-27 DIAGNOSIS — M25641 Stiffness of right hand, not elsewhere classified: Secondary | ICD-10-CM

## 2018-01-27 DIAGNOSIS — M79644 Pain in right finger(s): Secondary | ICD-10-CM

## 2018-01-27 DIAGNOSIS — M19049 Primary osteoarthritis, unspecified hand: Secondary | ICD-10-CM

## 2018-01-27 NOTE — Progress Notes (Signed)
HAND THERAPY DAILY NOTE          GENERAL VISIT INFO  Encounter Diagnoses   Name Primary?   . Hand arthritis Yes   . Pain of right middle finger    . Stiffness of finger joint of right hand      Referring Provider: Danielson, Kyle R, ARNP    Certification From*: 12/23/17  Certification To*: 02/09/18  Date of Symptom Onset*: 11/01/17  Start of Care Date*: 12/23/17  Reason for Referral*: 67 yo female wtih limited ROM of right MF at DIP and PIP joints, likely arthritis. No history of trauma. She uses a walker and cane for mobility which may be aggravating. Please evalaute and treat.            VISIT COUNT    3       EVALUATION       General Assessment  Certification From*: 12/23/17  Certification To*: 02/09/18  Date of Symptom Onset*: 11/01/17  Start of Care Date*: 12/23/17  Reason for Referral*: 67 yo female wtih limited ROM of right MF at DIP and PIP joints, likely arthritis. No history of trauma. She uses a walker and cane for mobility which may be aggravating. Please evalaute and treat.  Precautions: none  Pain Score: 2  Aggravating Factors: writing, drawing, driving, general use of her hand  Relieving Factors: rest  Patient Outcome Tool: QuickDash  Outcome Score: 30  Have you fallen in the past year?: No  Are you afraid of falling?: No  Issues with walking/balance/feeling unsteady: No  Fall Screen Date: 12/23/17   Based on screening, patient's fall risk is moderate.  4WW for longer distances, cane for shorter distances , wall walks when at home.     MECHANISM OF INJURY    Progressive, no trauma.     PRIOR FUNCTION      Prior Level of Function  Level of Independence: Independent with ADLs and functional transfers;Independent with homemaking with ambulation  Ambulation: Modified independent  Vocational: Retired      Past Medical History:   Diagnosis Date   . Anxiety    . Cervical disc disease    . GERD (gastroesophageal reflux disease)    . Leukocytosis    . Muscle cramps 04/2011   . Osteoarthritis     chronic   .  Pain of toe of right foot 02/2010    5th toe        EXAMINATION/OBJECTIVE      ROM:         DIGIT ROM:  AROM    Pre/Post treatment        MP PIP DIP Comment   R Index finger       R Middle finger 80/100 2-100/100 70/78    R Ring finger       R Small finger       L Index finger       L Middle finger 84 96 80    L Ring finger       L Small finger         Composite Flexion/ Fist, tip to DPC: NT    Sensation:   Pt reports normal sensation. Light touch to all fingertips normal.     Integumentary:  Normal     Edema:  MF P1 - Right 6.4, Left 5.9    Palpation:  No TTP    Dexterity/Coordination:  Decreased    Strength:   Right HD     Right   Left Comments   Grip 30 45    Pinch Lat   Pinches NT due to thumb pn   Pinch 3 pt        Special Tests: None    TREATMENT    Trialed use of paraffin for heat with discussion regarding unit for home use.  She notes increased pain of MF, she is doing TGE's with too much force and for too many reps which is likely contributing to her increased pain. Pt notes right MF locked this morning with exercises, she needed left hand to release finger into extension. Educated in trigger finger with need to rest, changes to TGE's should help to reduce risk of MF triggering.  Instructed her to do TGE's lightly, touching palm with tips of fingers, not squeezing into fist, etc.  Also will do AROM exercises 2x/day only, had sometimes been doing them 6x/day.  Instructed in isometric strengthening ex for grip and pinch (muscle contraction without joint motion).  Written instructions given.  Applied kinesiotape for CMC support with discussion of wear and care of tape including most comfortable removal.  Discussed options for drawing includig heating hand prior to drawing, trial with orthosis in place, various pen grips to limit tension on painful joints.  Pt reports painful left thumb CMC joint and is interested in having a custom HBTS for left as well. Therapist to contact ARNP for possible referral.  Discussed  comfort cool neoprene orthosis as a possible alternative.    EDUCATION    Education  Primary Learner: Patient  Preferred Language: English  Education Preference: Verbal;Reading/Handout;Demonstration  Method of Education: Handout;Demonstration;Verbal  Response to Education: Demonstrates independently  Barriers to Education: None  Cultural practices that influence care: None    Educated in pertinent anatomy and anticipated progression through therapy.    ASSESSMENT   Pt is a right hand dominant female who is currently not working. She developed right MF pain and stiffness 7 weeks ago without trauma and was diagnosed with arthritis. She also reports right thumb MP pain that is limiting the function of her right dominant hand. She will benefit from a course of hand therapy for heat modalities, edema control, A/PROM, custom orthosis fabrication, strengthening prn, joint protection/adaptive equipment. She has a history of bilateral knee OA and is anticipating TKR in the coming months. Increased MF pain and starting to trigger, adjusted intensity and frequency of TGE's that will likely help to decrease new MF symptoms.    GOALS      Therapy Goals  Patient Goals: Decrease pain with hand use.  Potential Barriers to Achieving Goals: None Defined    STG: 2 weeks  (01/06/18)  Independent with HEP.   MET  Independent with use of orthosis.   MET    LTG: 6 weeks  (02/09/18)  No longer waking at night with pain.   MET  Pt to carry a grocery bag in her right hand from the car to the house.   PROGRESS - less use of MF  Pt to open a jar with adaptive equipment.   MET  Pt to draw x 15 min without increase in pain.   PROGRESS    Patient requires skilled therapy to restore prior level of function utilizing the treatment and modalities described in this plan of care.     Rehab Potential: Good     PLAN       Planned Intervention(s)*: Patient Education;Occupational Therapy Evaluation;Paraffin;Therapeutic Exercise;Manual Therapy  Techniques           Therapy Review Date: 02/09/17    Therapy Frequency: 1/wk      Next visit: Contact Kyle Danielson, ARNP for possible referral for left HBTS orthosis.      Thank you for this referral.     , OTR/L, CLT, CHT

## 2018-01-30 ENCOUNTER — Telehealth (INDEPENDENT_AMBULATORY_CARE_PROVIDER_SITE_OTHER): Payer: Self-pay | Admitting: Nurse Practitioner

## 2018-01-30 DIAGNOSIS — M1812 Unilateral primary osteoarthritis of first carpometacarpal joint, left hand: Secondary | ICD-10-CM

## 2018-01-30 NOTE — Telephone Encounter (Addendum)
Message below from OT.   Referral signed.     ----- Message -----  From: Loletta Parish, OT  Sent: 01/27/2018   4:09 PM PST  To: Ronald Lobo, ARNP  Subject: Left thumb splint                                Hi Caitlin Wiley-    You approved a right thumb spica orthosis for Caitlin Wiley on 12/16. She is now complaining of left thumb CMC pain and is asking a bout a splint for her left thumb. She has a positive grind test, pain with CMC palpation and resisted thumb extension on the left. Would you be able to put in a new referral for her left thumb or would you need to see her first?    Thank you,   Joan Mayans, OTR/L, CHT

## 2018-02-07 ENCOUNTER — Encounter (HOSPITAL_BASED_OUTPATIENT_CLINIC_OR_DEPARTMENT_OTHER): Payer: Medicare HMO

## 2018-02-08 ENCOUNTER — Ambulatory Visit: Payer: Medicare HMO | Attending: Rheumatology

## 2018-02-08 ENCOUNTER — Ambulatory Visit (HOSPITAL_BASED_OUTPATIENT_CLINIC_OR_DEPARTMENT_OTHER): Payer: Medicare HMO | Admitting: Rehabilitative and Restorative Service Providers"

## 2018-02-08 ENCOUNTER — Ambulatory Visit (INDEPENDENT_AMBULATORY_CARE_PROVIDER_SITE_OTHER): Payer: Medicare HMO | Admitting: Rheumatology

## 2018-02-08 VITALS — BP 118/78 | HR 80 | Wt 264.0 lb

## 2018-02-08 DIAGNOSIS — M0579 Rheumatoid arthritis with rheumatoid factor of multiple sites without organ or systems involvement: Secondary | ICD-10-CM | POA: Insufficient documentation

## 2018-02-08 DIAGNOSIS — M79645 Pain in left finger(s): Secondary | ICD-10-CM

## 2018-02-08 DIAGNOSIS — Z79899 Other long term (current) drug therapy: Secondary | ICD-10-CM

## 2018-02-08 DIAGNOSIS — M170 Bilateral primary osteoarthritis of knee: Secondary | ICD-10-CM

## 2018-02-08 DIAGNOSIS — M25641 Stiffness of right hand, not elsewhere classified: Secondary | ICD-10-CM

## 2018-02-08 DIAGNOSIS — Z6841 Body Mass Index (BMI) 40.0 and over, adult: Secondary | ICD-10-CM

## 2018-02-08 DIAGNOSIS — M19049 Primary osteoarthritis, unspecified hand: Secondary | ICD-10-CM

## 2018-02-08 DIAGNOSIS — M79644 Pain in right finger(s): Secondary | ICD-10-CM

## 2018-02-08 LAB — CBC, DIFF
% Basophils: 1 %
% Eosinophils: 2 %
% Immature Granulocytes: 0 %
% Lymphocytes: 20 %
% Monocytes: 6 %
% Neutrophils: 71 %
% Nucleated RBC: 0 %
Absolute Eosinophil Count: 0.26 10*3/uL (ref 0.00–0.50)
Absolute Lymphocyte Count: 2.11 10*3/uL (ref 1.00–4.80)
Basophils: 0.1 10*3/uL (ref 0.00–0.20)
Hematocrit: 44 % (ref 36–45)
Hemoglobin: 14.5 g/dL (ref 11.5–15.5)
Immature Granulocytes: 0.03 10*3/uL (ref 0.00–0.05)
MCH: 29 pg (ref 27.3–33.6)
MCHC: 32.7 g/dL (ref 32.2–36.5)
MCV: 89 fL (ref 81–98)
Monocytes: 0.61 10*3/uL (ref 0.00–0.80)
Neutrophils: 7.62 10*3/uL — ABNORMAL HIGH (ref 1.80–7.00)
Nucleated RBC: 0 10*3/uL
Platelet Count: 323 10*3/uL (ref 150–400)
RBC: 5 10*6/uL (ref 3.80–5.00)
RDW-CV: 16 % — ABNORMAL HIGH (ref 11.6–14.4)
WBC: 10.73 10*3/uL — ABNORMAL HIGH (ref 4.3–10.0)

## 2018-02-08 LAB — COMPREHENSIVE METABOLIC PANEL
ALT (GPT): 10 U/L (ref 7–33)
AST (GOT): 14 U/L (ref 9–38)
Albumin: 4.2 g/dL (ref 3.5–5.2)
Alkaline Phosphatase (Total): 72 U/L (ref 38–172)
Anion Gap: 11 (ref 4–12)
Bilirubin (Total): 0.6 mg/dL (ref 0.2–1.3)
Calcium: 9.5 mg/dL (ref 8.9–10.2)
Carbon Dioxide, Total: 26 meq/L (ref 22–32)
Chloride: 103 meq/L (ref 98–108)
Creatinine: 0.88 mg/dL (ref 0.38–1.02)
GFR, Calc, African American: >60 mL/min/{1.73_m2} (ref >59)
GFR, Calc, European American: >60 mL/min/{1.73_m2} (ref >59)
Glucose: 95 mg/dL (ref 62–125)
Potassium: 3.9 meq/L (ref 3.6–5.2)
Protein (Total): 6.5 g/dL (ref 6.0–8.2)
Sodium: 140 meq/L (ref 135–145)
Urea Nitrogen: 22 mg/dL — ABNORMAL HIGH (ref 8–21)

## 2018-02-08 LAB — SED RATE: Erythrocyte Sedimentation Rate: 17 mm/h (ref 0–20)

## 2018-02-08 LAB — C_REACTIVE PROTEIN: C_Reactive Protein: 11.9 mg/L — ABNORMAL HIGH (ref 0.0–10.0)

## 2018-02-08 MED ORDER — METHOTREXATE SODIUM 2.5 MG OR TABS
20.0000 mg | ORAL_TABLET | ORAL | 3 refills | Status: DC
Start: 2018-02-08 — End: 2019-02-12

## 2018-02-08 NOTE — Progress Notes (Signed)
[place]  [address]  TEL: [phone]  l  FAX: [phone]    Follow Up Clinic Note  02/08/2018    IDENTIFYING DATA/CC:    Caitlin Wiley is a very pleasant 68 year old female who presents for follow up of rheumatoid arthritis.    RHEUMATOLOGIC HISTORY OF PRESENT ILLNESS:  Diagnosis: RA, OA; initial contult 11/30/2017  Presenting Symptoms/Timeline: 2013 joint pain hands, wrists, knees, shoulders  Autoimmune Serologies: RF 140, CCP 16; CRP 17, ESR 23; UA 4.6  Pertinent Radiology: Xrays of hands, wrists, knees, feet: No erosions, consistent with OA, chondrocalcinosis  Medications Used: MXT 11/2017  Significant Flares:  Immunization Status: Influenza, PCV13, PPSV23, Zoster  Infectious Disease Screening: Hepatitis B NR, Hepatitis C, TB quantiferon NR  Other: Afib on chronic anticoag,     INTERVAL HISTORY:  Patient is here for follow-up of rheumatoid arthritis.  She was last seen 2 months ago at which time we started methotrexate for treatment of her rheumatoid arthritis. Due to diffuse joint symptoms we also started a short course of prednisone for disease capture.    Felt "wonderful on prednisone" and continued to have minimal joints symptoms for about a week after prednisone was taper. Started MTX and had "flu like symptoms" for the first few weeks but now no side effects. Do believe it is helping. Hands less pain and swelling. Her knees continues to bother her. 15-30 mins of AM stiffness.  No infectious complications.    REVIEW OF SYSTEMS:  Complete ROS is negative, except for those mentioned above in the Interval History    PROBLEM LIST:  Patient Active Problem List    Diagnosis Date Noted   . Primary osteoarthritis of both knees [M17.0] 09/12/2017   . Pain in both knees [M25.561, M25.562] 09/12/2017   . Chronic anticoagulation [Z79.01] 08/02/2015   . Right cervical radiculopathy [M54.12] 05/30/2015   . Obesity [E66.9] 05/30/2015   . Paroxysmal atrial  fibrillation ([place]) [I48.0] 04/17/2014     A. S/p ablation ([facility]) 10/19/2016     . GERD (gastroesophageal reflux disease) [K21.9] 11/08/2012   . OA (osteoarthritis) [M19.90] 07/28/2012   . Asthma [J45.909] 07/24/2012   . Hypercholesterolemia [E78.00] 07/24/2012   . Edema [R60.9] 07/24/2012   . Sleep apnea [G47.30] 07/24/2012   . HTN (hypertension) [I10] 07/24/2012   . Pulmonary nodule [R91.1] 07/24/2012   . Grief reaction [F43.21] 07/24/2012     05/11/2011         SOCIAL/FAMILY HISTORY:  Reviewed with patient. No changes from what is currently noted in EPIC    ALLERGIES:  Review of patient's allergies indicates:  Allergies   Allergen Reactions   . Bee Venom Anaphylaxis   . Ciprofloxacin Other     Joint aching   . Pneumococcal Vaccines Hives   . Sulfa Antibiotics Hives   . Cats [Animals] Itching   . Dust Mite Extract Itching     DUST   . Pollen Extract Itching       MEDICATIONS:  Current Outpatient Medications   Medication Sig Dispense Refill   . Acetaminophen (TYLENOL) 325 MG Oral Tab Take 325 mg by mouth every 6 hours as needed.     [name] apixaban (Eliquis) 5 MG tablet Take 1 tablet (5 mg) by mouth 2 times a day. 180 tablet 3   . Cholecalciferol (VITAMIN D3 OR) Take by mouth.     . Flaxseed, Linseed, (FLAXSEED OIL) 1200  MG Oral Cap None Entered     . folic acid 1 MG tablet Take 1 tablet (1 mg) by mouth daily. 90 tablet 3   . GLUCOSAMINE CHONDROITIN COMPLX OR      . Magnesium 400 MG Oral Cap Take 400 mg by mouth.     . methotrexate 2.5 MG tablet Take methotrexate weekly. Week 1: take 3 tablets (7.26m), Week 2: take 5 tablet (12.522m, Week 3 and after: take 6 tablets (1573m24 tablet 3   . MONTELUKAST 10 MG tablet TAKE ONE TABLET BY MOUTH ONCE DAILY 90 tablet 0   . Multiple Vitamins-Minerals (MULTIVITAMIN OR) no iron     . Probiotic Product (PROBIOTIC DAILY OR)      . raNITIdine HCl 150 MG Oral Tab Take 1 tablet (150 mg) by mouth 2 times a day. 180 tablet 3   . TRIAMTERENE-HYDROCHLOROTHIAZIDE 37.5-25 MG tablet TAKE ONE  TABLET BY MOUTH ONCE DAILY 90 tablet 0   . Unclassified (OTHER MEDS, SEE COMMENTS,) Curamin oral tab daily     . VENTOLIN HFA 108 (90 Base) MCG/ACT inhaler INHALE ONE TO TWO PUFFS BY MOUTH EVERY 6 HOURS AS NEEDED FOR SHORTNESS OF BREATH /WHEEZING 18 g 2   . warfarin (COUMADIN) 5 MG Oral Tab Take 1 tablet (5 mg) by mouth daily. 1 tablet daily or as directed 110 tablet 3     No current facility-administered medications for this visit.        PHYSICAL EXAMINATION:  Vital signs:  BP 118/78   Pulse 80   Wt (!) 264 lb (119.7 kg)   LMP  (LMP Unknown)   BMI 42.63 kg/m   General:  Awake, alert, and oriented, no apparent distress, pleasant, and cooperative  Psychologic:  Mood is euthymic, affect is congruent  EYES: Anicteric sclera, no conjunctival injection  ENT:  Normocephalic, atraumatic, moist membranes, no oral ulcers   Pulmonary:  Non-labored breathing, no wheezes, rhonchi, or rales  Cardiovascular:  RRR, no murmurs, rubs or gallops  Skin:  Normal temperature and texture, No visible rashes, ulcers, or nodules  Neurologic:  Light touch sensation is grossly intact. Face symmetric   Musculoskeletal:     Gait: Normal   Joints: No evidence of synovitis, joint swelling. Tenderness in the medial aspect of the tibial plateau of both knees.   Nails: No pitting, clubbing, or cyanosis noted in the fingernails bilaterally    Muscle strength: Strength is grossly 5 out of 5 throughout the bilateral upper and lower extremities    LABS:    Pending    RADIOLOGY:   None    DISEASE ACTIVITY:  Patient Function: 2.3  Patient Pain Assessment: 5.5  Patient Global Assessment: 2.5   Combined Rapid 3 Score: 10.3    ASSESSMENT AND PLAN:  Caitlin Wiley a very pleasant 67 68ar old female who presents for follow up of rheumatoid arthritis.    #. Rheumatoid arthritis. Seropositive, nonerosive RA. Doing better since MTX. As she is tolerating her initial dose w/o side effect, I would like to increase to 76m80mce a week. We  discussed splitting the dose for better absorption.    - increase methotrexate 76mg37me a week   - continue folic acid 1mg d2my     #. High risk medication. MTX toxicity labs today.    #. Immunization. She is due for second pneumonia booster and her influenza vaccine. I recommend that she get both these today and wait couple of days before starting her  immune medications for best effect. She will also need to get shingles vaccine if not already done.    #. Bone health. Did not have time to discuss today.      All the patient's questions were answered. The patient will follow up in 3 months.    Dictated using speech recognition technology and electronically signed by  , MD   Note: This document may contain errors inherent to this technology. If you find any errors that might affect patient care, please contact our office as soon as possible.   [address], [address], [place], [place] [address]  Ph. [phone] Fx. [phone]

## 2018-02-08 NOTE — Progress Notes (Signed)
HAND THERAPY DAILY NOTE          GENERAL VISIT INFO  Encounter Diagnoses   Name Primary?   . Hand arthritis    . Pain of right middle finger    . Stiffness of finger joint of right hand    . Pain of left thumb Yes     Referring Provider: Ronald Lobo, ARNP    Certification From*: 62/37/62  Certification To*: 83/15/17  Date of Symptom Onset*: 11/01/17  Start of Care Date*: 12/23/17  Reason for Referral*: 68 yo female wtih limited ROM of right MF at DIP and PIP joints, likely arthritis. No history of trauma. She uses a walker and cane for mobility which may be aggravating. Please evalaute and treat.            VISIT COUNT    4       EVALUATION       General Assessment  Certification From*: 61/60/73  Certification To*: 71/06/26  Date of Symptom Onset*: 11/01/17  Start of Care Date*: 12/23/17  Reason for Referral*: 68 yo female wtih limited ROM of right MF at DIP and PIP joints, likely arthritis. No history of trauma. She uses a walker and cane for mobility which may be aggravating. Please evalaute and treat.  Precautions: none  Pain Score: 2  Aggravating Factors: writing, drawing, driving, general use of her hand  Relieving Factors: rest  Patient Outcome Tool: QuickDash  Outcome Score: 30  Have you fallen in the past year?: No  Are you afraid of falling?: No  Issues with walking/balance/feeling unsteady: No  Fall Screen Date: 12/23/17   Based on screening, patient's fall risk is moderate.  9SW for longer distances, cane for shorter distances , wall walks when at home.     MECHANISM OF INJURY    Progressive, no trauma.     PRIOR FUNCTION      Prior Level of Function  Level of Independence: Independent with ADLs and functional transfers;Independent with homemaking with ambulation  Ambulation: Modified independent  Vocational: Retired      Past Medical History:   Diagnosis Date   . Anxiety    . Cervical disc disease    . GERD (gastroesophageal reflux disease)    . Leukocytosis    . Muscle cramps 04/2011   .  Osteoarthritis     chronic   . Pain of toe of right foot 02/2010    5th toe      SUBJECTIVE    Pain: Left thumb CMC pain with functional use. 2/10.    EXAMINATION/OBJECTIVE      ROM:         DIGIT ROM:  AROM    Pre/Post treatment        MP PIP DIP Comment   R Index finger       R Middle finger 80/100 2-100/100 70/78    R Ring finger       R Small finger       L Index finger       L Middle finger 84 96 80    L Ring finger       L Small finger         Composite Flexion/ Fist, tip to [facility]: NT    Sensation:   Pt reports normal sensation. Light touch to all fingertips normal.     Integumentary:  Normal     Edema:  MF P1 - Right 6.4, Left 5.9    Palpation:  No TTP    Dexterity/Coordination:  Decreased    Strength:   Right HD     Right Left Comments   Grip 30 45    Pinch Lat   Pinches NT due to thumb pn   Pinch 3 pt        Special Tests: None    TREATMENT    New referral from Rudean Curt, ARNP per therapist request for left thumb custom orthosis.  Fabricated HBTS orthosis for left hand to assist with pain reduction which will increase functional use of left hand.  Review of wear and care of orthosis, pt has been using custom right HBTS so is familiar with information.    EDUCATION    Education  Primary Learner: Patient  Preferred Language: English  Education Preference: Verbal;Reading/Handout;Demonstration  Method of Education: Handout;Demonstration;Verbal  Response to Education: Demonstrates independently  Barriers to Education: None  Cultural practices that influence care: None    Educated in pertinent anatomy and anticipated progression through therapy.    ASSESSMENT   Pt is a right hand dominant female who is currently not working. She developed right MF pain and stiffness 7 weeks ago without trauma and was diagnosed with arthritis. She also reports right thumb MP pain that is limiting the function of her right dominant hand. She will benefit from a course of hand therapy for heat modalities, edema control, A/PROM,  custom orthosis fabrication, strengthening prn, joint protection/adaptive equipment. She has a history of bilateral knee OA and is anticipating TKR in the coming months.     GOALS      Therapy Goals  Patient Goals: Decrease pain with hand use.  Potential Barriers to Achieving Goals: None Defined    STG: 2 weeks  (01/06/18)  Independent with HEP.   MET  Independent with use of orthosis.   MET    LTG: 6 weeks   No longer waking at night with pain.   MET  Pt to carry a grocery bag in her right hand from the car to the house.   PROGRESS - less use of MF  Pt to open a jar with adaptive equipment.   MET  Pt to draw x 15 min without increase in pain.   PROGRESS  Additional goal for new left thumb pain on 02/08/18 with updated referral for orthosis:  Pt to report 1/10 left thumb pain with use of HBTS orthosis during personal ADL tasks.    Patient requires skilled therapy to restore prior level of function utilizing the treatment and modalities described in this plan of care.     Rehab Potential: Good     PLAN       Planned Intervention(s)*: Patient Education;Occupational Therapy Evaluation;Paraffin;Therapeutic Exercise;Manual Therapy Techniques         Therapy Review Date: 02/09/17    Therapy Frequency: 1/wk      Next visit: Adjustment to orthosis prn.      Thank you for this referral.    Joan Mayans, OTR/L, CLT, CHT

## 2018-02-08 NOTE — Patient Instructions (Signed)
It was a pleasure seeing you in clinic today.     Increase methotrexate to 20mg  (8 tabs) once a week. Okay to split the dose to 4 tabs in the AM and 4 tabs in the PM.   Continue the folic acid.   Get labs after your appointment today   Follow up in 3 months    If you have any questions or concerns before your next appointment, please do not hesitate to contact me. Our clinic phone number is [phone] and I am also available via [facility].

## 2018-02-10 ENCOUNTER — Other Ambulatory Visit (HOSPITAL_BASED_OUTPATIENT_CLINIC_OR_DEPARTMENT_OTHER): Payer: Self-pay

## 2018-02-10 DIAGNOSIS — I480 Paroxysmal atrial fibrillation: Secondary | ICD-10-CM

## 2018-03-02 ENCOUNTER — Encounter (HOSPITAL_BASED_OUTPATIENT_CLINIC_OR_DEPARTMENT_OTHER): Payer: Self-pay | Admitting: Internal Medicine

## 2018-03-02 ENCOUNTER — Other Ambulatory Visit (HOSPITAL_BASED_OUTPATIENT_CLINIC_OR_DEPARTMENT_OTHER): Payer: Self-pay

## 2018-03-02 ENCOUNTER — Other Ambulatory Visit (HOSPITAL_BASED_OUTPATIENT_CLINIC_OR_DEPARTMENT_OTHER): Payer: Self-pay | Admitting: Internal Medicine

## 2018-03-02 ENCOUNTER — Encounter (HOSPITAL_BASED_OUTPATIENT_CLINIC_OR_DEPARTMENT_OTHER): Payer: Medicare HMO | Admitting: Internal Medicine

## 2018-03-02 DIAGNOSIS — I480 Paroxysmal atrial fibrillation: Secondary | ICD-10-CM

## 2018-03-02 NOTE — Telephone Encounter (Signed)
OPENED IN ERROR

## 2018-03-09 IMAGING — DX RIBS RIGHT 2 VIEW
1 series · 3 of 3 positions shown · non-contrast
Comparison: Chest 2 views 11/11/2017

RIBS RIGHT 2 VIEW, 03/09/2018 [DATE]: 
CLINICAL INDICATION: Pain since August. Burning sensation under the right 
breast. No recent injury.

[Series 1: AP · U · 0.14mm/px · 3 of 3 slices shown]
[im 1/3]
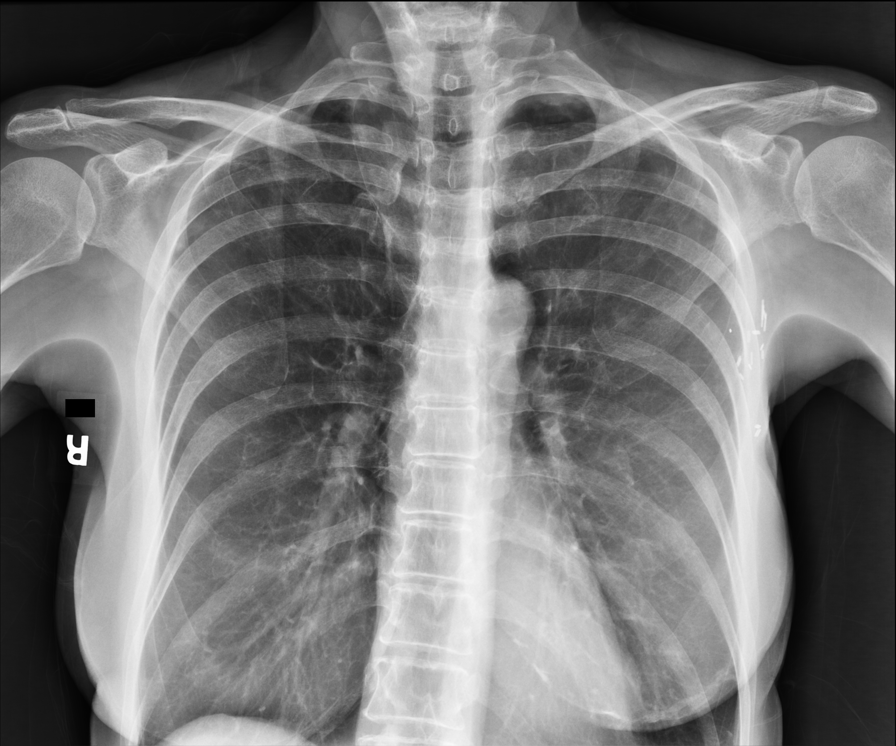
[im 2/3]
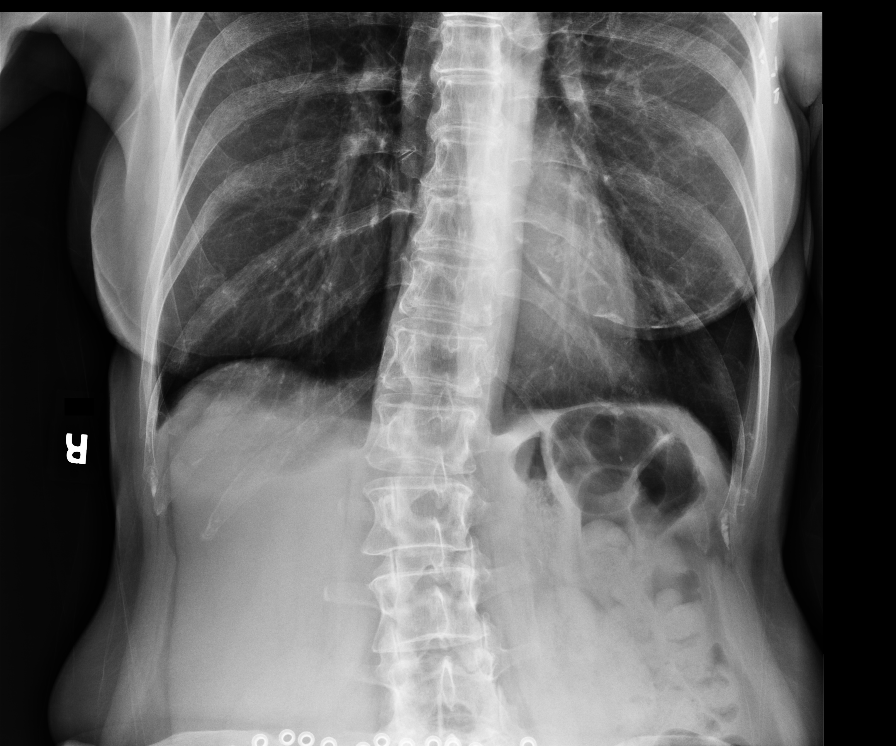
[im 3/3]
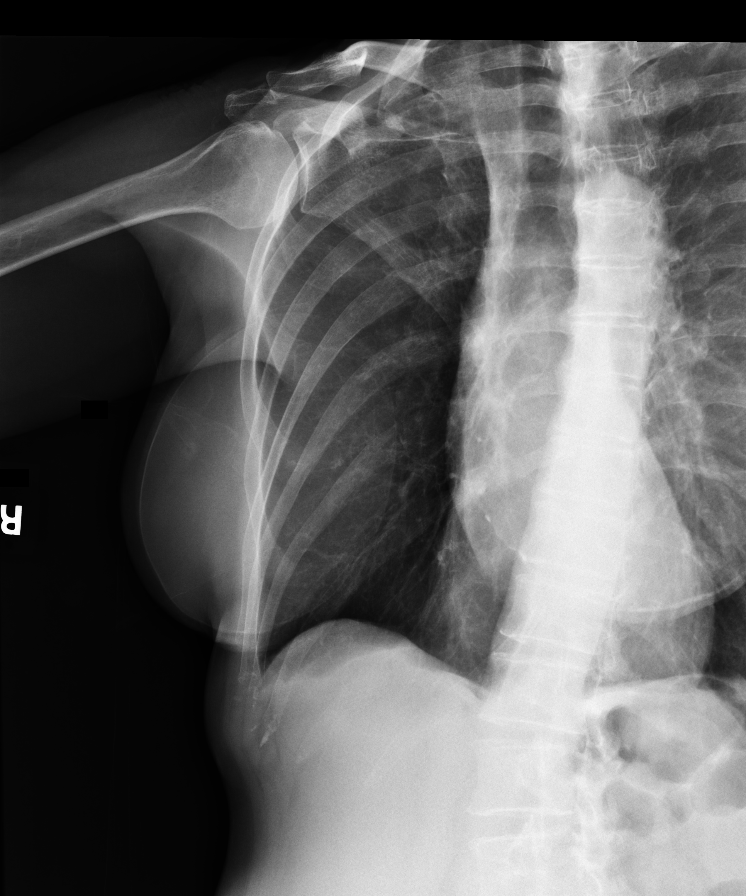

[3 of 3 positions shown; findings below may reference images not displayed]

FINDINGS: No acute bony fracture line. No bony subluxation. Lungs are clear. No 
pneumothorax. No pleural effusion. Chest wall soft tissues are intact. Bilateral 
breast implants are noted with partial peripheral calcification. There are 
surgical clips within the left axilla. Upper abdominal structures are grossly 
negative.
IMPRESSION: Negative right rib series.

## 2018-05-08 ENCOUNTER — Telehealth (HOSPITAL_BASED_OUTPATIENT_CLINIC_OR_DEPARTMENT_OTHER): Payer: Self-pay | Admitting: Cardiovascular Disease

## 2018-05-08 NOTE — Telephone Encounter (Signed)
Patient left a message on Saturday morning around 8am. She states that she was in AFib since Friday and her resting heart rate was 110.  Her BP was 98/68.  She then calls back and states that she is now in sinus rhythm as of 9am on Saturday.  Please call and advise.    Molly Maduro, CMA

## 2018-05-08 NOTE — Telephone Encounter (Signed)
Given that this is her first episode of more prolonged atrial fibrillation in over 1 year, I recommend only observation for now.  If she starts having more frequent episodes of symptomatic atrial fibrillation, we can discuss rhythm control strategies.

## 2018-05-08 NOTE — Telephone Encounter (Addendum)
It had been about a year and a half since her last episodes of Afib.  During her Afib on 4/17-4/18 Friday to Saturday morning (12 hours) she could feel  her "heart beating" the low BP also caused some mild orthostatic fatigue when changing positions from supine to sitting.  She felt more fatigued with activities.  She denied any SOB or chest pain.  She will continue to monitor her symptoms for ongoing Afib episodes.  I'll message Dr Nicole Kindred regarding this since she is currently not on any antiarrhythmic medications.     Dr Les Pou note from 01/05/18:  A/P:  (I48.0) Paroxysmal atrial fibrillation ([place])  (primary encounter diagnosis)  She has had no symptomatic episodes of atrial fibrillation but the 30 day monitor did show several short episodes lasting 17 seconds or less.  The duration of atrial fibrillation does seem to matter for determination of stroke risk, her stroke risk would be lower with only short episodes of atrial fibrillation compared to a patient with prolonged paroxysms or chronic atrial fibrillation.  She has the option to discontinue anticoagulation at this time, but my conservative recommendation is to continue anticoagulation in large part because stroke can be such a devastating outcome.  We agreed to continue anticoagulation at this time, but she will change from warfarin to Eliquis 5 mg twice daily if affordable.  She will talk to her pharmacist about the cost before making a final decision.  We discussed starting Eliquis once her INR falls below 2.0.  See patient instructions.  She is to report symptomatic episodes of atrial fibrillation or any bleeding.    Plan: apixaban (Eliquis) 5 MG tablet           (I10) Essential hypertension  Blood pressures well controlled continue present therapy

## 2018-05-09 NOTE — Telephone Encounter (Signed)
Called patient back and Roman understood the plan.  She stated this has happened to her before.  She had Afib ablation on 10/19/2016.

## 2018-05-15 ENCOUNTER — Telehealth (INDEPENDENT_AMBULATORY_CARE_PROVIDER_SITE_OTHER): Payer: Medicare HMO | Admitting: Rheumatology

## 2018-05-15 DIAGNOSIS — M0579 Rheumatoid arthritis with rheumatoid factor of multiple sites without organ or systems involvement: Secondary | ICD-10-CM

## 2018-05-15 DIAGNOSIS — Z5181 Encounter for therapeutic drug level monitoring: Secondary | ICD-10-CM

## 2018-05-15 NOTE — Progress Notes (Signed)
[place]  [address]  TEL: [phone]  l  FAX: [phone]    COVID-19 Telemedicine Note      NOTE:  I decided to perform this telemedicine encounter in lieu of the patient's scheduled in-person encounter with my clinic due to the current COVID-19 outbreak and the risk that an in-person visit would pose to the patient, clinic staff, or the general public.    I conducted this encounter from [facility] via secure, live, face-to-face video conference with the patient. Caitlin Wiley was located at home.  Prior to the interview, the risks and benefits of telemedicine were discussed with the patient and verbal consent was obtained.      05/15/2018    IDENTIFYING DATA:  MRN: [id number]  DOB: 1950-02-28    CHIEF COMPLAINT:      Caitlin Wiley is a very pleasant 68 year old female who presents for follow up of rheumatoid arthritis.    RHEUMATOLOGIC HISTORY OF PRESENT ILLNESS:  Diagnosis:RA, OA; initial contult 11/30/2017  Presenting Symptoms/Timeline:2013 joint pain hands, wrists, knees, shoulders  Autoimmune Serologies:RF 140, CCP 16; CRP 17, ESR 23; UA 4.6  Pertinent Radiology:Xrays of hands, wrists, knees, feet: No erosions, consistent with OA, chondrocalcinosis  Medications Used: MXT 11/2017  Significant Flares:  Immunization Status: Influenza, PCV13, PPSV23, Zoster  Infectious Disease Screening: HepatitisB NR,Hepatitis C, TB quantiferon NR  Other: Afib on chronic anticoag,    INTERVAL HISTORY:  Patient seen via TM due to COVID-19. She was seen 3 months ago at which time we increased MTX to 25m once a week.    Patient reports that about 3-4 weeks ago, she began feeling much better. Her joint symptoms significantly improved. Stiffness in the AM also improved. She is tolerating MTX 275monce a week. She has noticed some increase alopecia on her brush. No rash, oral ulcers, GI side effect.    She is happy with MTX and how this is working for  her.    REVIEW OF SYSTEMS:  Complete ROS is negative, except for those mentioned above in the Interval History    PROBLEM LIST:  Patient Active Problem List    Diagnosis Date Noted   . Rheumatoid arthritis involving multiple sites with positive rheumatoid factor [M05.79] 02/08/2018   . Primary osteoarthritis of both knees [M17.0] 09/12/2017   . Pain in both knees [M25.561, M25.562] 09/12/2017   . Chronic anticoagulation [Z79.01] 08/02/2015   . Right cervical radiculopathy [M54.12] 05/30/2015   . Obesity [E66.9] 05/30/2015   . Parox[place]mal atrial fibrillation ([place][I48.0] 04/17/2014     A. S/p ablation (Cryo PVI) 10/19/2016     . GERD (gastroesophageal reflux disease) [K21.9] 11/08/2012   . OA (osteoarthritis) [M19.90] 07/28/2012   . Asthma [J45.909] 07/24/2012   . Hypercholesterolemia [E78.00] 07/24/2012   . Edema [R60.9] 07/24/2012   . Sleep apnea [G47.30] 07/24/2012   . HTN (hypertension) [I10] 07/24/2012   . Pulmonary nodule [R91.1] 07/24/2012   . Grief reaction [F43.21] 07/24/2012     05/11/2011         SOCIAL/FAMILY HISTORY:  Reviewed with patient. No changes from what is currently noted in EPIC    ALLERGIES:  Review of patient's allergies indicates:  Allergies   Allergen Reactions   . Bee Venom Anaphylaxis   . Ciprofloxacin Other     Joint aching   . Pneumococcal Vaccines Hives   . Sulfa Antibiotics Hives   . Cats [  Animals] Itching   . Dust Mite Extract Itching     DUST   . Pollen Extract Itching       MEDICATIONS:  Current Outpatient Medications   Medication Sig Dispense Refill   . Acetaminophen (TYLENOL) 325 MG Oral Tab Take 325 mg by mouth every 6 hours as needed.     [name] apixaban (Eliquis) 5 MG tablet Take 1 tablet (5 mg) by mouth 2 times a day. 180 tablet 3   . Cholecalciferol (VITAMIN D3 OR) Take by mouth.     . Flaxseed, Linseed, (FLAXSEED OIL) 1200 MG Oral Cap None Entered     . folic acid 1 MG tablet Take 1 tablet (1 mg) by mouth daily. 90 tablet 3   . GLUCOSAMINE CHONDROITIN COMPLX OR      . Magnesium 400  MG Oral Cap Take 400 mg by mouth.     . methotrexate 2.5 MG tablet Take 8 tablets (20 mg) by mouth every 7 days. 72 tablet 3   . MONTELUKAST 10 MG tablet TAKE ONE TABLET BY MOUTH ONCE DAILY 90 tablet 0   . Multiple Vitamins-Minerals (MULTIVITAMIN OR) no iron     . Probiotic Product (PROBIOTIC DAILY OR)      . raNITIdine HCl 150 MG Oral Tab Take 1 tablet (150 mg) by mouth 2 times a day. 180 tablet 3   . TRIAMTERENE-HYDROCHLOROTHIAZIDE 37.5-25 MG tablet TAKE ONE TABLET BY MOUTH ONCE DAILY 90 tablet 0   . Unclassified (OTHER MEDS, SEE COMMENTS,) Curamin oral tab daily     . VENTOLIN HFA 108 (90 Base) MCG/ACT inhaler INHALE ONE TO TWO PUFFS BY MOUTH EVERY 6 HOURS AS NEEDED FOR SHORTNESS OF BREATH /WHEEZING 18 g 2     No current facility-administered medications for this visit.        LABS:  Results for orders placed or performed in visit on 02/08/18   CBC with Differential   Result Value Ref Range    WBC 10.73 (H) 4.3 - 10.0 10*3/uL    RBC 5.00 3.80 - 5.00 10*6/uL    Hemoglobin 14.5 11.5 - 15.5 g/dL    Hematocrit 44 36 - 45 %    MCV 89 81 - 98 fL    MCH 29.0 27.3 - 33.6 pg    MCHC 32.7 32.2 - 36.5 g/dL    Platelet Count 323 150 - 400 10*3/uL    RDW-CV 16.0 (H) 11.6 - 14.4 %    % Neutrophils 71 %    % Lymphocytes 20 %    % Monocytes 6 %    % Eosinophils 2 %    % Basophils 1 %    % Immature Granulocytes 0 %    Neutrophils 7.62 (H) 1.80 - 7.00 10*3/uL    Absolute Lymphocyte Count 2.11 1.00 - 4.80 10*3/uL    Monocytes 0.61 0.00 - 0.80 10*3/uL    Absolute Eosinophil Count 0.26 0.00 - 0.50 10*3/uL    Basophils 0.10 0.00 - 0.20 10*3/uL    Immature Granulocytes 0.03 0.00 - 0.05 10*3/uL    Nucleated RBC 0.00 0.00 10*3/uL    % Nucleated RBC 0 %   Comprehensive Metabolic Panel   Result Value Ref Range    Sodium 140 135 - 145 meq/L    Potassium 3.9 3.6 - 5.2 meq/L    Chloride 103 98 - 108 meq/L    Carbon Dioxide, Total 26 22 - 32 meq/L    Anion Gap 11 4 - 12  Glucose 95 62 - 125 mg/dL    Urea Nitrogen 22 (H) 8 - 21 mg/dL     Creatinine 0.88 0.38 - 1.02 mg/dL    Protein (Total) 6.5 6.0 - 8.2 g/dL    Albumin 4.2 3.5 - 5.2 g/dL    Bilirubin (Total) 0.6 0.2 - 1.3 mg/dL    Calcium 9.5 8.9 - 10.2 mg/dL    AST (GOT) 14 9 - 38 U/L    Alkaline Phosphatase (Total) 72 38 - 172 U/L    ALT (GPT) 10 7 - 33 U/L    GFR, Calc, European American >60 >59 mL/min/[1.73_m2]    GFR, Calc, African American >60 >59 mL/min/[1.73_m2]    GFR, Information       Calculated GFR in mL/min/1.73 m2 by MDRD equation.  Inaccurate with changing renal function.  See [email or web address]   CRP, high sensitivity   Result Value Ref Range    C_Reactive Protein 11.9 (H) 0.0 - 10.0 mg/L   RBC Sedimentation Rate   Result Value Ref Range    Erythrocyte Sedimentation Rate 17 0 - 20 mm/h         ASSESSMENT AND PLAN:  Caitlin Wiley is a very pleasant 68 year old female who presents for follow up of rheumatoid arthritis.    #.Rheumatoid arthritis.Seropositive, nonerosive RA. Doing much better since increase in MTX. We will continue her current dose for now. Is she continues to have stable disease and stable labs, she can continue on the current dose as long as she is tolerating it. We discussed increase in folic acid supplement which may help with alopecia.    -continue methotrexate 20m once a week  - continue folic acid 159mdaily   - we discussed COVID-19 precautions    #. High risk medication. MTX toxicity labs due. We should get her labs soon. We can wait a few more weeks as her previous labs were stable but I do not want to wait more than that. She understands this. She is not drinking EtOH.     #. Immunization. She is due for second pneumonia booster and her influenza vaccine. I recommend that she get both these today and wait couple of days before starting her immune medications for best effect. She will also need to get shingles vaccine if not already done.    We did not discuss this today.    #. Bone health. Did not have time  to discuss today.    I spent a total time of 22 minutes face-to-face with the patient, of which more than 50% was spent counseling and coordinating care as outlined in this note.      All the patient's questions were answered. The patient will follow up in 3-4 weeks.    CC: PCP    Dictated using speech recognition technology and electronically signed by YuRoxy HorsemanD   Note: This document may contain errors inherent to this technology. If you find any errors that might affect patient care, please contact our office as soon as possible.   [address], [place]Ph. [phone]

## 2018-05-16 ENCOUNTER — Telehealth (HOSPITAL_BASED_OUTPATIENT_CLINIC_OR_DEPARTMENT_OTHER): Payer: Self-pay | Admitting: Cardiovascular Disease

## 2018-05-16 NOTE — Telephone Encounter (Signed)
This morning Caitlin Wiley noticed some odd sensation with her heartbeats.  About every 5 to 9 beats or so she feels a "hesitating" beat.  BP and HR was normal per patient's report. When she looked on her phone app she could see the normal beats and then some sort of unrecognizable ectopy during the "half skipped beat".  She states this was not afib sensation like she had over a week ago.  She did think it was PVCs because she has had PVCs leading up to Afib back in 2017 or 2018 per the patient's report.  The symptoms have resolved now.  Regarding the odd sensation in her left arm she reports she thinks it's due to exercising because she feels it in both upper tricep areas.  She recently resumed some PT for her arms.  She also wanted to report dealing with family stress and increase of sleep apnea might be contributing to her symptoms.  She denies SOB, tingling, dizziness, CP, no dyspnea with exercise.  She just wanted [place] to know and she will continue to monitor her symptoms.  I'll send message to Dr Nicole Kindred.        Communication from 05/08/2018  Caitlin Feinstein, MD          4:46 PM   Note      Given that this is her first episode of more prolonged atrial fibrillation in over 1 year, I recommend only observation for now.  If she starts having more frequent episodes of symptomatic atrial fibrillation, we can discuss rhythm control strategies.

## 2018-05-16 NOTE — Telephone Encounter (Signed)
Pt called to report she's having irregular heart beat this morning that shows on her phone meter and feels like her heart is skipping a beat. Also feels on and off sensation on her left arm. No other symptoms, no SOB/chest pain. BP ok.    Please f/u.    Thank you,  Pennie Rushing  PSS Lead

## 2018-05-17 ENCOUNTER — Other Ambulatory Visit (INDEPENDENT_AMBULATORY_CARE_PROVIDER_SITE_OTHER): Payer: Self-pay | Admitting: Family Medicine

## 2018-05-17 ENCOUNTER — Ambulatory Visit: Payer: Medicare HMO | Attending: Cardiovascular Disease

## 2018-05-17 VITALS — BP 122/60 | HR 90 | Resp 14 | Wt 258.4 lb

## 2018-05-17 DIAGNOSIS — I480 Paroxysmal atrial fibrillation: Secondary | ICD-10-CM | POA: Insufficient documentation

## 2018-05-17 DIAGNOSIS — K219 Gastro-esophageal reflux disease without esophagitis: Secondary | ICD-10-CM

## 2018-05-17 MED ORDER — FAMOTIDINE 20 MG OR TABS
20.0000 mg | ORAL_TABLET | Freq: Two times a day (BID) | ORAL | 0 refills | Status: DC
Start: 2018-05-17 — End: 2018-08-21

## 2018-05-17 NOTE — Progress Notes (Signed)
S: Patient called today to report irregular heart beat has continued from yesterday.    B: Patient of Dr Nicole Kindred and last seen 01/05/2018.  She has hx of Afib and has had cardioversions and ablation.  Last treatment was ablation in 2018.  She also has sleep apnea and has been dealing with family stress recently.    A: Patient noted 12 hours of Afib from 05/05/2018-05/06/2018.  Then she called yesterday with concerns of what sounded like PVCs.  She called today to report the ongoing sensation and her phone app showing irregular heart rhythm.  Patient requested to be seen today for EKG.  Spoke to Dr Nicole Kindred about the patient and agreed she can come today for EKG and depending on the result of that will probably proceed with 2 day CAM.    BP: 122/60  HR: 60's to 90's  O2 sats 95%    R: EKG was reviewed with Dr Nicole Kindred which showed frequent PVCs.  The 2 day CAM Dr Nicole Kindred wanted to let the patient decide but the EKG was revealing so he stated no need to order.  Caitlin Wiley did not want to pursue the CAM, and feels more reassured the sensation she is having are just PVCs and no afib.  She does think the current stress is a factor to her heart rhythm.  No other interventions needed at this time.  She will notify us again about of any other symptoms.

## 2018-05-18 LAB — EKG 12 LEAD
Atrial Rate: 90 {beats}/min
P Axis: 67 degrees
P-R Interval: 184 ms
Q-T Interval: 368 ms
QRS Duration: 100 ms
QTC Calculation: 450 ms
R Axis: 87 degrees
T Axis: 49 degrees
Ventricular Rate: 90 {beats}/min

## 2018-05-19 ENCOUNTER — Encounter (INDEPENDENT_AMBULATORY_CARE_PROVIDER_SITE_OTHER): Payer: Self-pay | Admitting: Rheumatology

## 2018-05-19 DIAGNOSIS — M0579 Rheumatoid arthritis with rheumatoid factor of multiple sites without organ or systems involvement: Secondary | ICD-10-CM

## 2018-05-19 NOTE — Telephone Encounter (Signed)
Noted.  She is describing premature beats as well as a possible episode of atrial fibrillation recently.  She is anticoagulated.  I agree with observation for now.

## 2018-05-22 MED ORDER — FOLIC ACID 1 MG OR TABS
3.0000 mg | ORAL_TABLET | Freq: Every day | ORAL | 3 refills | Status: DC
Start: 2018-05-22 — End: 2019-05-11

## 2018-05-22 NOTE — Telephone Encounter (Signed)
Routing to provider for review and approval.      Last Labs: 02/08/2018  Last Office visit: 05/15/2018  Next Appointment: 06/20/2018

## 2018-06-20 ENCOUNTER — Ambulatory Visit: Payer: Medicare HMO | Attending: Rheumatology

## 2018-06-20 ENCOUNTER — Other Ambulatory Visit (INDEPENDENT_AMBULATORY_CARE_PROVIDER_SITE_OTHER): Payer: Medicare HMO | Admitting: Rheumatology

## 2018-06-20 ENCOUNTER — Ambulatory Visit (INDEPENDENT_AMBULATORY_CARE_PROVIDER_SITE_OTHER): Payer: Medicare HMO | Admitting: Rheumatology

## 2018-06-20 VITALS — BP 115/73 | HR 78 | Wt 261.0 lb

## 2018-06-20 DIAGNOSIS — Z79899 Other long term (current) drug therapy: Secondary | ICD-10-CM

## 2018-06-20 DIAGNOSIS — M0579 Rheumatoid arthritis with rheumatoid factor of multiple sites without organ or systems involvement: Secondary | ICD-10-CM | POA: Insufficient documentation

## 2018-06-20 DIAGNOSIS — M170 Bilateral primary osteoarthritis of knee: Secondary | ICD-10-CM

## 2018-06-20 DIAGNOSIS — Z780 Asymptomatic menopausal state: Secondary | ICD-10-CM

## 2018-06-20 LAB — CBC, DIFF
% Basophils: 1 %
% Eosinophils: 3 %
% Immature Granulocytes: 0 %
% Lymphocytes: 25 %
% Monocytes: 5 %
% Neutrophils: 66 %
% Nucleated RBC: 0 %
Absolute Eosinophil Count: 0.23 10*3/uL (ref 0.00–0.50)
Absolute Lymphocyte Count: 2.28 10*3/uL (ref 1.00–4.80)
Basophils: 0.06 10*3/uL (ref 0.00–0.20)
Hematocrit: 44 % (ref 36–45)
Hemoglobin: 14.3 g/dL (ref 11.5–15.5)
Immature Granulocytes: 0.04 10*3/uL (ref 0.00–0.05)
MCH: 29.1 pg (ref 27.3–33.6)
MCHC: 32.5 g/dL (ref 32.2–36.5)
MCV: 90 fL (ref 81–98)
Monocytes: 0.44 10*3/uL (ref 0.00–0.80)
Neutrophils: 5.96 10*3/uL (ref 1.80–7.00)
Nucleated RBC: 0 10*3/uL
Platelet Count: 311 10*3/uL (ref 150–400)
RBC: 4.91 10*6/uL (ref 3.80–5.00)
RDW-CV: 14.7 % — ABNORMAL HIGH (ref 11.6–14.4)
WBC: 9.01 10*3/uL (ref 4.3–10.0)

## 2018-06-20 LAB — COMPREHENSIVE METABOLIC PANEL
ALT (GPT): 15 U/L (ref 7–33)
AST (GOT): 17 U/L (ref 9–38)
Albumin: 4.1 g/dL (ref 3.5–5.2)
Alkaline Phosphatase (Total): 69 U/L (ref 38–172)
Anion Gap: 11 (ref 4–12)
Bilirubin (Total): 0.7 mg/dL (ref 0.2–1.3)
Calcium: 9.3 mg/dL (ref 8.9–10.2)
Carbon Dioxide, Total: 25 meq/L (ref 22–32)
Chloride: 104 meq/L (ref 98–108)
Creatinine: 0.87 mg/dL (ref 0.38–1.02)
Glucose: 95 mg/dL (ref 62–125)
Potassium: 4.1 meq/L (ref 3.6–5.2)
Protein (Total): 6.6 g/dL (ref 6.0–8.2)
Sodium: 140 meq/L (ref 135–145)
Urea Nitrogen: 20 mg/dL (ref 8–21)
eGFR by CKD-EPI: >60 mL/min/{1.73_m2} (ref >59)

## 2018-06-20 LAB — SED RATE: Erythrocyte Sedimentation Rate: 16 mm/h (ref 0–20)

## 2018-06-20 LAB — C_REACTIVE PROTEIN: C_Reactive Protein: 6.8 mg/L (ref 0.0–10.0)

## 2018-06-20 NOTE — Patient Instructions (Signed)
It was a pleasure seeing you in clinic today.     Continue your current medications   Get labs/DEXA after your appointment today   Follow up in 3-4 months    If you have any questions or concerns before your next appointment, please do not hesitate to contact me. Our clinic phone number is [phone] and I am also available via eCare.

## 2018-06-20 NOTE — Progress Notes (Signed)
[place]  [address]  TEL: [phone]  l  FAX: [phone]    Follow Up Clinic Note  06/20/2018    IDENTIFYING DATA/CC:    Caitlin Wiley is a very pleasant 68 year old female who presents for follow up of rheumatoid arthritis.    RHEUMATOLOGIC HISTORY OF PRESENT ILLNESS:  Diagnosis:RA, OA; initial contult 11/30/2017  Presenting Symptoms/Timeline:2013 joint pain hands, wrists, knees, shoulders  Autoimmune Serologies:RF 140, CCP 16; CRP 17, ESR 23; UA 4.6  Pertinent Radiology:Xrays of hands, wrists, knees, feet: No erosions, consistent with OA, chondrocalcinosis  Medications Used:MXT 11/2017  Significant Flares:  Immunization Status: Influenza, PCV13, PPSV23, Zoster  Infectious Disease Screening: HepatitisB NR,Hepatitis C, TB quantiferon NR  Other: Afib on chronic anticoag,    INTERVAL HISTORY:  Patient returns for follow up for rheumatoid arthritis. She was last seen 4-5 months ago at which time we increased MTX to 64m with good results.    Reports feeling well. She walked with her cane into clinic today and not her usual walker. Pain improved. Knee still bothers her especially the medial knee on the left. She is trying to be more active now that RA pain better.     Tolerating MTX w/o side effect. No infectious complications. Taking COVId-19 precautions. Working on the garden a lot.    No falls or fractures.    REVIEW OF SYSTEMS:  Complete ROS is negative, except for those mentioned above in the Interval History    PROBLEM LIST:  Patient Active Problem List    Diagnosis Date Noted   . Rheumatoid arthritis involving multiple sites with positive rheumatoid factor [M05.79] 02/08/2018   . Primary osteoarthritis of both knees [M17.0] 09/12/2017   . Pain in both knees [M25.561, M25.562] 09/12/2017   . Chronic anticoagulation [Z79.01] 08/02/2015   . Right cervical radiculopathy [M54.12] 05/30/2015   . Obesity [E66.9] 05/30/2015   . Paroxysmal atrial  fibrillation ([place] [I48.0] 04/17/2014     A. S/p ablation (Cryo PVI) 10/19/2016     . GERD (gastroesophageal reflux disease) [K21.9] 11/08/2012   . OA (osteoarthritis) [M19.90] 07/28/2012   . Asthma [J45.909] 07/24/2012   . Hypercholesterolemia [E78.00] 07/24/2012   . Edema [R60.9] 07/24/2012   . Sleep apnea [G47.30] 07/24/2012   . HTN (hypertension) [I10] 07/24/2012   . Pulmonary nodule [R91.1] 07/24/2012   . Grief reaction [F43.21] 07/24/2012     05/11/2011         SOCIAL/FAMILY HISTORY:  Reviewed with patient. No changes from what is currently noted in EPIC    ALLERGIES:  Review of patient's allergies indicates:  Allergies   Allergen Reactions   . Bee Venom Anaphylaxis   . Ciprofloxacin Other     Joint aching   . Pneumococcal Vaccines Hives   . Sulfa Antibiotics Hives   . Cats [Animals] Itching   . Dust Mite Extract Itching     DUST   . Pollen Extract Itching       MEDICATIONS:  Current Outpatient Medications   Medication Sig Dispense Refill   . Acetaminophen (TYLENOL) 325 MG Oral Tab Take 325 mg by mouth every 6 hours as needed.     .Marland Kitchenalbuterol HFA (Ventolin HFA) 108 (90 Base) MCG/ACT inhaler Inhale by mouth.     .Marland Kitchenapixaban (Eliquis) 5 MG tablet Take 1 tablet (5 mg) by mouth 2 times a day. 180 tablet 3   . Cholecalciferol (VITAMIN D3 OR)  Take by mouth.     . famotidine 20 MG tablet Take 1 tablet (20 mg) by mouth 2 times a day. 180 tablet 0   . Flaxseed, Linseed, (FLAXSEED OIL) 1200 MG Oral Cap None Entered     . folic acid 1 MG tablet Take 3 tablets (3 mg) by mouth daily. (Patient taking differently: Take 2 mg by mouth daily. ) 270 tablet 3   . GLUCOSAMINE CHONDROITIN COMPLX OR      . Magnesium 400 MG Oral Cap Take 400 mg by mouth.     . methotrexate 2.5 MG tablet Take 8 tablets (20 mg) by mouth every 7 days. 72 tablet 3   . MONTELUKAST 10 MG tablet TAKE ONE TABLET BY MOUTH ONCE DAILY 90 tablet 0   . Multiple Vitamins-Minerals (MULTIVITAMIN OR) no iron     . Probiotic Product (PROBIOTIC DAILY OR)      .  TRIAMTERENE-HYDROCHLOROTHIAZIDE 37.5-25 MG tablet TAKE ONE TABLET BY MOUTH ONCE DAILY 90 tablet 0   . Unclassified (OTHER MEDS, SEE COMMENTS,) Curamin oral tab daily     . VENTOLIN HFA 108 (90 Base) MCG/ACT inhaler INHALE ONE TO TWO PUFFS BY MOUTH EVERY 6 HOURS AS NEEDED FOR SHORTNESS OF BREATH [place] (Patient not taking: Reported on 05/17/2018) 18 g 2     No current facility-administered medications for this visit.        PHYSICAL EXAMINATION:  Vital signs:  BP 115/73   Pulse 78   Wt (!) 261 lb (118.4 kg)   LMP  (LMP Unknown)   BMI 42.15 kg/m   General:  Awake, alert, and oriented, no apparent distress, pleasant, and cooperative  Psychologic:  Mood is euthymic, affect is congruent  EYES: Anicteric sclera, no conjunctival injection  ENT:  Normocephalic, atraumatic, moist membranes, no oral ulcers   Pulmonary:  Non-labored breathing, no wheezes, rhonchi, or rales  Cardiovascular:  RRR, no murmurs, rubs or gallops  Skin:  Normal temperature and texture, No visible rashes, ulcers, or nodules  Neurologic:  Light touch sensation is grossly intact. Face symmetric   Musculoskeletal:     Gait: Normal   Joints: No evidence of synovitis. Left knee swelling and tenderness with palpation.   Nails: No pitting, clubbing, or cyanosis noted in the fingernails bilaterally    Muscle strength: Strength is grossly 5 out of 5 throughout the bilateral upper and lower extremities    LABS:    Pending    RADIOLOGY:   None    DISEASE ACTIVITY:  Patient Function: 1.7  Patient Pain Assessment: 2.5  Patient Global Assessment: 2.5   Combined Rapid 3 Score: 6.7    ASSESSMENT AND PLAN:  Caitlin Wiley is a very pleasant 68 year old female who presents for follow up of rheumatoid arthritis, high risk medication.    #.Rheumatoid arthritis.Seropositive, nonerosive RA.Doing much better since MTX. We will continue her current dose for now. Is she continues to have stable disease and stable labs, she can continue on the current  dose as long as she is tolerating it. We discussed increase in folic acid supplement which may help with alopecia.    -continuemethotrexate 30m once a week  -continuefolic acid 173mdaily   - we discussed COVID-19 precautions    #. High risk medication. MTX toxicity labs today.    #. Osteoarthritis. Most bothersome in the knees. She is interested in TKR but will need to lose some weight first which I am hopeful now that she is more active.  She has tried PT and continues with the exercises.    #. Immunization. She is due for second pneumonia booster and her influenza vaccine. I recommend that she get both these today and wait couple of days before starting her immune medications for best effect. She will also need to get shingles vaccine if not already done.    We did not discuss this today.    #. Bone health. Last DEXA from 2017 was normal. We will repeat today as it has been over 3 years. No falls or fractures but patient is at some risk for falls. In addition, if she is considering TKR in the future, it would be helpful to optimize her BMD.     - DEXA today      All the patient's questions were answered. The patient will follow up in 3-4 months.    Dictated using Chief of Staff and electronically signed by Roxy Horseman, MD   Note: This document may contain errors inherent to this technology. If you find any errors that might affect patient care, please contact our office as soon as possible.   [address], [address], [place], [place]  Ph. [phone] Fx. [phone]

## 2018-06-22 NOTE — Result Encounter Note (Signed)
Your labs look great. Both your inflammatory markers are now normal. This is great news and reflects methotrexate working for you. Your DEXA (measure of bone mineral density) is normal. No need to start treatment for fracture prevention at this time. Best, YM.

## 2018-07-03 ENCOUNTER — Encounter (HOSPITAL_BASED_OUTPATIENT_CLINIC_OR_DEPARTMENT_OTHER): Payer: Self-pay | Admitting: Unknown Physician Specialty

## 2018-07-11 ENCOUNTER — Ambulatory Visit (HOSPITAL_BASED_OUTPATIENT_CLINIC_OR_DEPARTMENT_OTHER): Payer: Medicare HMO | Admitting: Cardiovascular Disease

## 2018-07-11 ENCOUNTER — Ambulatory Visit: Payer: Medicare HMO | Attending: Cardiovascular Disease | Admitting: Cardiovascular Disease

## 2018-07-11 DIAGNOSIS — I480 Paroxysmal atrial fibrillation: Secondary | ICD-10-CM | POA: Insufficient documentation

## 2018-07-11 DIAGNOSIS — I10 Essential (primary) hypertension: Secondary | ICD-10-CM | POA: Insufficient documentation

## 2018-07-11 DIAGNOSIS — Z7901 Long term (current) use of anticoagulants: Secondary | ICD-10-CM

## 2018-07-11 NOTE — Progress Notes (Signed)
[facility]istant [facility] Telemedicine Encounter    I conducted this encounter from [employer] via secure, live, face-to-face video conference with the patient. Caitlin Wiley was located at home with no other participants.  Prior to the interview, the risks and benefits of telemedicine were discussed with the patient and verbal consent was obtained.      Caitlin Wiley is a 68 year old woman with paroxysmal atrial fibrillation, hypertension, sleep apnea and rheumatoid arthritis now on methotrexate.  She has a history of atrial fibrillation ablation in October 2018 and she was treated with sotalol until December 2018.  A 30-day event monitor last fall showed very short episodes of atrial fibrillation lasting up to 17 seconds or less.  She has remained on long-term oral anticoagulation, now on Eliquis.  She denies bleeding and notes less bruising than she had on warfarin.  She had a 12-hour episode of atrial fibrillation back in April but denies a recurrence.  Her heart rate today is 85-99 but regular by her report.  She denies syncope or TIA symptoms but does note orthostatic lightheadedness.  She describes improving arthritis on methotrexate now able to do yard work, and she denies exertional dyspnea or palpitations.  Average systolic blood pressure recently has been 133 mmHg, more typical for her is around 126 mmHg.  She has lost another 5 pounds with diet, and she has now lost a total of 60 pounds in the past year.    Review of patient's allergies indicates:  Allergies   Allergen Reactions   . Bee Venom Anaphylaxis   . Ciprofloxacin Other     Joint aching   . Pneumococcal Vaccines Hives   . Sulfa Antibiotics Hives   . Cats [Animals] Itching   . Dust Mite Extract Itching     DUST   . Pollen Extract Itching     Current Outpatient Medications   Medication Sig Dispense Refill   . Acetaminophen (TYLENOL) 325 MG Oral Tab Take 325 mg by mouth every 6 hours as needed.     [name] albuterol HFA (Ventolin HFA) 108 (90 Base) MCG/ACT inhaler Inhale by  mouth.     [name] apixaban (Eliquis) 5 MG tablet Take 1 tablet (5 mg) by mouth 2 times a day. 180 tablet 3   . Cholecalciferol (VITAMIN D3 OR) Take by mouth.     . famotidine 20 MG tablet Take 1 tablet (20 mg) by mouth 2 times a day. 180 tablet 0   . Flaxseed, Linseed, (FLAXSEED OIL) 1200 MG Oral Cap None Entered     . folic acid 1 MG tablet Take 3 tablets (3 mg) by mouth daily. (Patient taking differently: Take 2 mg by mouth daily. ) 270 tablet 3   . GLUCOSAMINE CHONDROITIN COMPLX OR      . Magnesium 400 MG Oral Cap Take 400 mg by mouth.     . methotrexate 2.5 MG tablet Take 8 tablets (20 mg) by mouth every 7 days. 72 tablet 3   . MONTELUKAST 10 MG tablet TAKE ONE TABLET BY MOUTH ONCE DAILY 90 tablet 0   . Multiple Vitamins-Minerals (MULTIVITAMIN OR) no iron     . Probiotic Product (PROBIOTIC DAILY OR)      . TRIAMTERENE-HYDROCHLOROTHIAZIDE 37.5-25 MG tablet TAKE ONE TABLET BY MOUTH ONCE DAILY 90 tablet 0   . Unclassified (OTHER MEDS, SEE COMMENTS,) Curamin oral tab daily     . VENTOLIN HFA 108 (90 Base) MCG/ACT inhaler INHALE ONE TO TWO PUFFS BY MOUTH EVERY 6 HOURS AS NEEDED FOR  SHORTNESS OF BREATH [place] (Patient not taking: Reported on 05/17/2018) 18 g 2     No current facility-administered medications for this visit.      Patient Active Problem List   Diagnosis   . Asthma   . Hypercholesterolemia   . Edema   . Sleep apnea   . HTN (hypertension)   . Pulmonary nodule   . Grief reaction   . OA (osteoarthritis)   . GERD (gastroesophageal reflux disease)   . Paroxysmal atrial fibrillation (HCC)   . Right cervical radiculopathy   . Obesity   . Chronic anticoagulation   . Primary osteoarthritis of both knees   . Pain in both knees   . Rheumatoid arthritis involving multiple sites with positive rheumatoid factor     Laboratories: From June 2 sedimentation rate down to 16, CRP down to 6.8, CBC and CMP normal.    A/P:  (I48.0) Paroxysmal atrial fibrillation ([place])  (primary encounter diagnosis)   She has had one episode of  atrial fibrillation in the past 6 months.  She will continue on anticoagulation given occasional atrial fibrillation recurrence.  Plan: Continue anticoagulation, observation    (I10) Essential hypertension  Overall blood pressure is well controlled.  She will continue to monitor.  Plan: Continue current therapy

## 2018-07-11 NOTE — Patient Instructions (Addendum)
Overall you seem to be doing very well.  You may have intermittent atrial fibrillation, but I am okay with that as long as you are feeling well and you remain on anticoagulation.  I am glad you are doing well with Eliquis.    Please continue to monitor blood pressure, I would like to see your average systolic blood pressure 121 mmHg or less.    I will plan to see you in 6 months or sooner as needed.

## 2018-08-17 ENCOUNTER — Other Ambulatory Visit (INDEPENDENT_AMBULATORY_CARE_PROVIDER_SITE_OTHER): Payer: Self-pay | Admitting: Family Medicine

## 2018-08-17 DIAGNOSIS — K219 Gastro-esophageal reflux disease without esophagitis: Secondary | ICD-10-CM

## 2018-08-17 DIAGNOSIS — J4520 Mild intermittent asthma, uncomplicated: Secondary | ICD-10-CM

## 2018-08-21 MED ORDER — FAMOTIDINE 20 MG OR TABS
ORAL_TABLET | ORAL | 0 refills | Status: DC
Start: 2018-08-21 — End: 2018-10-13

## 2018-08-21 MED ORDER — MONTELUKAST SODIUM 10 MG OR TABS
ORAL_TABLET | ORAL | 0 refills | Status: DC
Start: 2018-08-21 — End: 2018-10-13

## 2018-08-21 NOTE — Telephone Encounter (Signed)
Patient is due for yearly exam.  One refill authorized.  Please schedule follow up visit.

## 2018-08-21 NOTE — Telephone Encounter (Signed)
Ecare message sent

## 2018-08-22 NOTE — Telephone Encounter (Signed)
LM to call back    90 refills back       CCR- schedule return with PCP

## 2018-08-22 NOTE — Telephone Encounter (Signed)
Pt called back and says she will call at a later time to schedule       Closing TE

## 2018-09-15 ENCOUNTER — Other Ambulatory Visit (INDEPENDENT_AMBULATORY_CARE_PROVIDER_SITE_OTHER): Payer: Self-pay

## 2018-09-15 ENCOUNTER — Encounter (INDEPENDENT_AMBULATORY_CARE_PROVIDER_SITE_OTHER): Payer: Self-pay | Admitting: Family Medicine

## 2018-09-15 ENCOUNTER — Ambulatory Visit (INDEPENDENT_AMBULATORY_CARE_PROVIDER_SITE_OTHER): Payer: Self-pay

## 2018-09-15 DIAGNOSIS — Z20822 Contact with and (suspected) exposure to covid-19: Secondary | ICD-10-CM

## 2018-09-15 DIAGNOSIS — R197 Diarrhea, unspecified: Secondary | ICD-10-CM

## 2018-09-15 NOTE — Telephone Encounter (Signed)
Ecare message:    Dr. Velvet Bathe - I have been having intestinal issues since Monday.  At first, it was just loose stool for a couple days, then yesterday the stool was mostly liquid and the cramping increased and urge to go to the bathroom was every hour or so.   I continued to have a normal appetite until Thursday am, when it decreased.  I changed to a bland diet (rice, bread) increased fluids (water) and took Emergen-c. I have checked my temperature and it has remained in the normal range the entire time (98.1 today). I have felt well enough to carry out normal activities (housework etc), although this has decreased today.    I have a history or diverticulitis, so it could be that, I may have eaten some tainted onion (I see there was a recall because of Salmonella), or perhaps picked up something from eating wild blackberries and cleaning out a pond that is frequented by birds and small animals. Of course, I thought of Covid, but I have been pretty isolated except for deliveries and one trip to the car dealership. What would you suggest I do?    Patient c/o diarrhea and abdominal cramping before going to the bathroom x 5 days. Averaging about 7-8 BM/day. Patient able to keep water and food down. No recent antibiotics.    RN Phone Triage for Possible COVID-19  Last Updated July 26, 2018    Chief Complaint:  Evaluation for possible COVID-19     HPI:   Caitlin Wiley is a 68 year old female who is being evaluated for possible COVID-19 symptoms or concerns.     Has the patient had NEW symptoms of:  Yes No   []    [x]   Cough    []    [x]   Fever (T > 38C or 100.38F)    []    [x]   Shortness of breath    []    [x]   Myalgias    []    [x]   Chills   []    [x]   Rhinorrhea   []    [x]   Sore throat    []    [x]   Anosmia   []    [x]   Ageusia   []    [x]   Headache   []    []   GI symptoms (e.g. nausea, vomiting, diarrhea)    If the patient is asymptomatic:  Yes No   []    []   Is there a requirement from an employer that requires  testing?   []    []   Is there a requirement for testing for travel purposes?   []    []   Was the patient exposed to COVID-19 ? 48 hours ago?   (Testing recommended for household contacts and high risk exposed individuals ? 48 hours after exposure)    If the patient is symptomatic, when did symptoms start?   09/11/18    Other symptoms/additional HPI?   None    [facility]: Are you a Nurse, learning disability? No    Auditory Physical Exam:  General:   [x]  Sounds alert []  Sounds lethargic   [x]  In no acute distress []  In acute distress     Respiratory:  [x]  Speaking in full sentences comfortably []  Unable to speak in full sentences comfortably   [x]  No cough during conversation []  Coughing during conversation       Assessment:  Does the patient meet criteria for COVID-19 testing? Yes    Plan:  Arranged for testing and reviewed plan for  result communication    Cheral Almas, RN    Plan: No available appointments at [facility]. Patient will test for COVID 19. Patient will use [facility] today. Advised patient to call 911 or go to closest emergency room if life threatening symptoms occur.    Reason for Disposition  . SEVERE diarrhea (e.g., 7 or more times / day more than normal) and age > 60 years    Additional Information  . Negative: Shock suspected (e.g., cold/pale/clammy skin, too weak to stand)  . Negative: Difficult to awaken or acting confused (e.g., disoriented, slurred speech)  . Negative: Sounds like a life-threatening emergency to the triager  . Negative: Vomiting also present and worse than the diarrhea  . Negative: Blood in stool and without diarrhea  . Negative: SEVERE abdominal pain (e.g., excruciating) and present > 1 hour  . Negative: SEVERE abdominal pain and age > 19  . Negative: Bloody, black, or tarry bowel movements    Protocols used: DIARRHEA-ADULT-OH

## 2018-09-15 NOTE — Telephone Encounter (Signed)
Forward to nurse Triage regarding  GI issues. Please see pt e-caremessage

## 2018-09-16 ENCOUNTER — Ambulatory Visit (INDEPENDENT_AMBULATORY_CARE_PROVIDER_SITE_OTHER): Payer: Medicare HMO

## 2018-09-16 DIAGNOSIS — Z1159 Encounter for screening for other viral diseases: Secondary | ICD-10-CM

## 2018-09-16 NOTE — Telephone Encounter (Signed)
Patient dropped off forms; placing in Dr. Gavin Pound box. Patient would like call today informing if Covering provider will order labs for her.

## 2018-09-16 NOTE — Telephone Encounter (Signed)
The patient will drop off summary of labs ordered in person.

## 2018-09-16 NOTE — Telephone Encounter (Signed)
Pt is needing labs done per her virtual clinic visit, they are wanting to do: stool C&S, fecal leukocyte, GSA ( not pended not sure what it is) , O&P and to consider CBC and CMP.    Form is in your inbox for review on Tuesday, if an office visit is needed please let us know.

## 2018-09-16 NOTE — Progress Notes (Signed)
Patient was seen on 09/16/2018 at the [facility] drive up site where a sample of dual nasal pharyngeal collection was taken. The specimen was sent to the [facility] lab for COVID-19 testing.  Patient will be informed of test results within 48 hours.  Patient received informational instructions on self-care.    The specimen was collected by: James Davies, RN

## 2018-09-16 NOTE — Telephone Encounter (Signed)
Awaiting reply from pt

## 2018-09-16 NOTE — Patient Instructions (Signed)
Evaluation for COVID-19  Testing, Result Information, Symptom Management    Who is being tested for COVID-19?  [facility] is testing patients for COVID-19 for:  1. Patients who have symptoms that may be related to COVID-19  2. Patients who were exposed to COVID-19  3. Patients who require testing for travel or to return to work  4. Patients who do not have symptoms, but have an upcoming surgery or procedure that requires routine COVID-19 testing beforehand    If a COVID-19 test was ordered, what number do I call to set up a swabbing appointment?  You may call the [facility] COVID-19 Line at [phone].    If you are experiencing symptoms, what do we believe you have?  You have a viral syndrome, which may include symptoms like muscle aches, fevers, chills, runny nose, cough, sneezing, sore throat, vomiting or diarrhea.     SARS-CoV-2, the virus that causes COVID-19, is one of the potential viruses you may have. You may be just as likely to have a different viral infection such as the common cold or flu.    Most patients with COVID-19 have mild symptoms and recover on their own. Resting, staying hydrated, and sleeping are typically helpful. As of today's visit, you are well enough to go home and treat your symptoms with oral fluids and medicines for fevers, cough, pain, etc. If your symptoms worsen, you should seek additional medical care.    Why is COVID-19 testing being performed before my surgery/procedure?  The safety of our patients and staff is our top priority. We are performing COVID-19 testing before certain surgeries and procedures to maintain everyone's safety and help prevent others from getting infected or exposed.    When will I receive results for my COVID-19 test?  If COVID-19 testing is performed, the results should be available in 1-2 days. You may find testing follow-up instructions here: [email or web address]    Who can I contact for questions?  Call  [phone] for any COVID-19 questions or if your symptoms are worsening. Please allow 48 hours for results to finalize before contacting us about your result status.    How do I receive results?  Please do not contact the Emergency Department or clinic for results of this test. Please wait to be contacted as outlined below and do not go to your doctor's office for results.    IF THE RESULT IS POSITIVE OR INCONCLUSIVE  A member of the [facility] team will call you for further discussion. You may also view your result in eCare or through a QR code you may receive at your testing site.    IF THE RESULT IS NEGATIVE  You will receive this information by phone, via eCare, or a QR code you may receive at your testing site.    Pre-surgical evaluations  A member of your surgery team will review your results and contact you if needed. Please remain isolated until your surgery date to reduce the risk of COVID-19 exposure.    eCare  If you are a [facility] patient, eCare ([email or web address]) is the fastest way to receive your results. Results will be released to eCare within one hour of being posted in our system, and you may receive your results before we are able to contact you.    QR Code  You may receive a QR code label at the time of your test. If you do not have an eCare account, you may use the QR code label to view   your results at [email or web address]. You will not receive a notification when your result is ready to view on this site, but you can visit the site as many times as you wish to check the result status.    What do I do while I wait for my test results?   Stay home except to get medical care. Do not return to work or your regular activities outside of home. Remain isolated until you receive your results.    After receiving your results, follow the instructions here: [email or web address]    Please follow the precautions below:   Stay home except to  get medical care.     Do not go to work, school, or public areas. Avoid using public transportation, ride-sharing, or taxis.   Separate yourself from other people in your home as much as possible.   Stay in a specific room and away from other people in your home as much as possible. Use a separate bathroom if possible.   Do not share household items with other people in your home.   This includes sharing dishes, drinking glasses, cups, eating utensils, towels or bedding. After using these items, they should be washed thoroughly with soap and water.   Clean all "high-touch" surfaces regularly.   This includes counters, tabletops, doorknobs, bathroom fixtures, toilets, phones, keyboards, tablets and bedside tables. Also, clean any surfaces that may have blood, stool or body fluids on them. Use a household cleaning spray or wipe, according to the label instructions.   Cover your coughs and sneezes with a tissue, mask or the inside of your elbow.   Throw used tissues in a lined trash can; immediately wash your hands with soap and water for at least 20 seconds or clean your hands with an alcohol-based hand sanitizer that contains at least 60% alcohol. Soap and water should be used if hands are visibly dirty.   When seeking care at a healthcare facility:   Seek prompt medical attention if your illness is worsening (e.g., difficulty breathing).   When possible, call the healthcare provider before arriving.   Put on a facemask before you enter the facility.   If possible, put on a facemask before the ambulance or paramedics arrive.   These steps will help the healthcare provider's office prevent other people from getting infected or exposed.      Please see the resources below for more information  Information Lines  [place] [facility] of Health COVID-19 Call Center: [phone]   [facility] COVID-19 Line: [phone]    [facility] Websites  COVID-19  Information  [email or web address]    [place] Department of Health Websites  General Facts on COVID-19  doh.wa.gov/Emergencies/NovelCoronavirusOutbreak2020/FactSheet    What to do if you were potentially exposed to someone with confirmed coronavirus disease (COVID-19)  doh.wa.gov/Portals/1/Documents/1600/coronavirus/COVIDexposed.pdf    Centers for Disease Control and Prevention (CDC) Websites  COVID-19 FAQs:  cdc.gov/coronavirus/2019-ncov/faq.html    What to do if you are sick: cdc.gov/coronavirus/2019-ncov/if-you-are-sick/steps-when-sick.html

## 2018-09-17 ENCOUNTER — Ambulatory Visit: Payer: Medicare HMO | Attending: Family Medicine

## 2018-09-17 LAB — COVID-19 CORONAVIRUS QUALITATIVE PCR: COVID-19 Coronavirus Qual PCR Result: NOT DETECTED

## 2018-09-17 NOTE — Result Encounter Note (Signed)
eCare message sent for negative COVID-19 result

## 2018-09-17 NOTE — Telephone Encounter (Signed)
Order signed.

## 2018-09-18 ENCOUNTER — Telehealth (HOSPITAL_BASED_OUTPATIENT_CLINIC_OR_DEPARTMENT_OTHER): Payer: Self-pay | Admitting: Cardiovascular Disease

## 2018-09-18 ENCOUNTER — Other Ambulatory Visit (INDEPENDENT_AMBULATORY_CARE_PROVIDER_SITE_OTHER): Payer: Self-pay | Admitting: Family Medicine

## 2018-09-18 DIAGNOSIS — R197 Diarrhea, unspecified: Secondary | ICD-10-CM

## 2018-09-18 LAB — STOOL LEUKOCYTE EXAM

## 2018-09-18 NOTE — Telephone Encounter (Signed)
Caitlin Wiley called back and stated she thinks she is back in NSR again.  She is feeling better.  She will call back if she has more episodes of Afib.

## 2018-09-18 NOTE — Telephone Encounter (Signed)
She had been having diarrhea for little over a week and on Friday the virtual clinic put her on liquid diet.  She had some rice and apple sauce and the diarrhea came back.  With the GI issues and her elderly cat she has been under stress.  Yesterday she thought she was in afib and her her BP machine showed irregular pulse.  This morning her BP was 128/73 HR of 90.  She can feel the irregular rhythm.  She describes every 4 to 5 beats she feels an extra beat.  She is feeling fatigued since yesterday, but no other symptoms.  She has continued on Eliquis.  She also has a phone app that said her HR is in the 70's but was still irregular.  She does not think her HR has gone above 100.  I told her it was best to come in tomorrow when Dr Nicole Kindred was going to be in so we can get an EKG.  She did not want to come in because she has been to couple of medical appointments already due to her GI issues.  She really does think her recent episodes of paroxysmal afib is from all the stress.  She is not on any antiarrythmic medication.  She thought the last time she was on it was possibly a year or so ago and it was sotalol.  She last came to clinic in April for EKG and she was having frequent PVCs during that time.  She will call us back if she is wanting to come in for an EKG this week for nurse visit.

## 2018-09-19 ENCOUNTER — Ambulatory Visit: Payer: Medicare HMO | Attending: Cardiovascular Disease | Admitting: Unknown Physician Specialty

## 2018-09-19 ENCOUNTER — Telehealth (HOSPITAL_BASED_OUTPATIENT_CLINIC_OR_DEPARTMENT_OTHER): Payer: Self-pay | Admitting: Cardiovascular Disease

## 2018-09-19 DIAGNOSIS — I480 Paroxysmal atrial fibrillation: Secondary | ICD-10-CM

## 2018-09-19 LAB — EXTENDED STOOL PARASITE MICROSCOPIC EXAM

## 2018-09-19 NOTE — Telephone Encounter (Signed)
Called and discussed Dr. Les Pou recommendation. Caitlin Wiley will be coming to clinic tonight for a 4 pm appointment.

## 2018-09-19 NOTE — Telephone Encounter (Signed)
Patient called.  She states that she is having irregular heart beats again.  Please call patient and advise.    Molly Maduro, CMA

## 2018-09-19 NOTE — Progress Notes (Signed)
CAM Monitor    Ordering/Referring: Dr. Evon Slack    Dx: Paroxysmal afib    Hookup Tech: Lennette Bihari    Start time: [id number]    Duration: 2 days    Special Instructions:     Explained to patient how to wear and utilize the function of the moniter. The patient understood not to get the monitor wet and to bring the monitor and all associated equipment back to the clinic. Patient verbally acknowledged the instructions and had no further questions and left with all needed equipment for the monitoring period.

## 2018-09-19 NOTE — Telephone Encounter (Signed)
Patient of Dr. Les Pou with a history of paroxysmal atrial fibrillation, HTN, sleep apnea, and RA. Last seen via telemedicine on 07/11/18. At that time had only had 1 instance of atrial fibrillation in the past 6 months.    She called on 09/18/18 with concerns about atrial fibrillation. She can feel extra beats but was otherwise asymptomatic. She was offered an RN appointment on 09/19/18 with EKG while Dr. Nicole Kindred is in clinic and available to review, but decline as she went back into NSR.    Caitlin Wiley is calling today with another episode of irregular beats. She has concerns about going in and out of atrial fibrillation. It has similarities to being in atrial fibrillation, but more like a "thud" every 4-5 beats. This happened yesterday (09/18/18) and then again today which is why she is calling back. HR during these episodes has been controlled, highest was around 90 bpm. No SOB, diaphoresis, lightheadedness, or dizziness. She is having a slight feeling of fatigue and being shaky. She has been trying to eat a bland diet due to GI issues, and feels like her shakiness may be related to food intake. She is working with her PCP for follow up of her diarrhea which has been ongoing for over 1 week.    Caitlin Wiley was instructed to notify Dr. Nicole Kindred if she has any further episodes of atrial fibrillation at her appointment with him in June which is why she is calling.    Routing to Dr. Nicole Kindred to evaluate.

## 2018-09-19 NOTE — Telephone Encounter (Signed)
The history suggests she is having premature beats and not atrial fibrillation.  The only way to know for sure is to check her rhythm.  I recommend a 2-day patch monitor.

## 2018-09-20 ENCOUNTER — Telehealth (INDEPENDENT_AMBULATORY_CARE_PROVIDER_SITE_OTHER): Payer: Self-pay | Admitting: Family Medicine

## 2018-09-20 LAB — ENTERIC PATHOGENS PANEL

## 2018-09-20 NOTE — Telephone Encounter (Signed)
Called pt to follow up on  Virtual visit on 8/28 for diarrhea. Pt notes that sx are improving, but not better. Pt states that stool is more formed. Pt dropped off stool samples on Saturday and would like to know the results and possible reason for what caused her diarrhea.

## 2018-09-21 LAB — EXTENDED STOOL PARASITE MICROSCOPIC EXAM

## 2018-09-21 NOTE — Telephone Encounter (Signed)
LMTCB otherwise will try again tomorrow.     Plan: please see Dr. Gavin Pound below message.    CCR: Okay to transfer to me at [phone]

## 2018-09-21 NOTE — Telephone Encounter (Signed)
Please let Caitlin Wiley know that her stool cultures are negative.  Please offer he an appointment with me (no holds) to discuss her symptoms if needed.

## 2018-09-22 NOTE — Telephone Encounter (Signed)
Pt notified of results.    She declined to schedule.    Closing TE.

## 2018-10-03 ENCOUNTER — Ambulatory Visit: Payer: Medicare HMO | Attending: Cardiovascular Disease

## 2018-10-03 DIAGNOSIS — I4891 Unspecified atrial fibrillation: Secondary | ICD-10-CM | POA: Insufficient documentation

## 2018-10-03 NOTE — Progress Notes (Signed)
Analysis of 2 day CAM was completed and report forwarded to consult reader, Dr Patton. CV

## 2018-10-09 ENCOUNTER — Encounter (INDEPENDENT_AMBULATORY_CARE_PROVIDER_SITE_OTHER): Payer: Self-pay | Admitting: Family Medicine

## 2018-10-09 NOTE — Telephone Encounter (Signed)
Notified pt we will not have them until October

## 2018-10-11 ENCOUNTER — Encounter (HOSPITAL_BASED_OUTPATIENT_CLINIC_OR_DEPARTMENT_OTHER): Payer: Medicare HMO | Admitting: Adult Congenital Heart Disease

## 2018-10-11 DIAGNOSIS — I480 Paroxysmal atrial fibrillation: Secondary | ICD-10-CM

## 2018-10-12 ENCOUNTER — Telehealth (INDEPENDENT_AMBULATORY_CARE_PROVIDER_SITE_OTHER): Payer: Self-pay | Admitting: Rheumatology

## 2018-10-12 NOTE — Telephone Encounter (Signed)
Pt has a wellness visit tomorrow w/ her PCP and wanted to ask Dr. Verner Mould if she  Needs any labs drawn for her upcoming appt. Either way, please call pt, she will end up changing her appt to TM if no labs are needed.

## 2018-10-13 ENCOUNTER — Ambulatory Visit (INDEPENDENT_AMBULATORY_CARE_PROVIDER_SITE_OTHER): Payer: Medicare HMO | Admitting: Family Medicine

## 2018-10-13 ENCOUNTER — Encounter (INDEPENDENT_AMBULATORY_CARE_PROVIDER_SITE_OTHER): Payer: Self-pay | Admitting: Family Medicine

## 2018-10-13 VITALS — BP 101/67 | HR 92 | Temp 97.9°F | Resp 16 | Ht 64.57 in | Wt 257.0 lb

## 2018-10-13 DIAGNOSIS — R600 Localized edema: Secondary | ICD-10-CM

## 2018-10-13 DIAGNOSIS — J4520 Mild intermittent asthma, uncomplicated: Secondary | ICD-10-CM

## 2018-10-13 DIAGNOSIS — K219 Gastro-esophageal reflux disease without esophagitis: Secondary | ICD-10-CM

## 2018-10-13 DIAGNOSIS — Z0000 Encounter for general adult medical examination without abnormal findings: Secondary | ICD-10-CM

## 2018-10-13 MED ORDER — FAMOTIDINE 20 MG OR TABS
20.0000 mg | ORAL_TABLET | Freq: Two times a day (BID) | ORAL | 3 refills | Status: DC
Start: 2018-10-13 — End: 2019-11-02

## 2018-10-13 MED ORDER — MONTELUKAST SODIUM 10 MG OR TABS
10.0000 mg | ORAL_TABLET | Freq: Every day | ORAL | 3 refills | Status: DC
Start: 2018-10-13 — End: 2019-11-06

## 2018-10-13 MED ORDER — TRIAMTERENE-HCTZ 37.5-25 MG OR TABS
1.0000 | ORAL_TABLET | Freq: Every day | ORAL | 3 refills | Status: DC
Start: 2018-10-13 — End: 2019-11-06

## 2018-10-13 NOTE — Progress Notes (Signed)
Visit: pt is here for a wellness exam. Pt would like to follow up on current health issues. Pt would like to discuss episodes of diarrhea/loose that stated at the end of August.     Refills? YES  Referral? YES- GI   Letter or Form? NO  Lab Results? NO    HEALTH MAINTENANCE:  Has the patient has this done since their last visit?  Cervical screening/PAP: N/A  Mammo: N/A  Colon Screen: N/A  Diabetic Eye Exam (If applicable): N/A    Have you seen a specialist since your last visit: No    Vaccines Due? No    Does patient have eCare?  YES     HM Due:   Health Maintenance   Topic Date Due   . Zoster Vaccine (1 of 2) 03/02/2001   . Medicare Annual Wellness Visit  09/02/2018   . Depression Screening (PHQ-2)  09/15/2018   . Influenza Vaccine (1) 10/19/2018   . DTaP, Tdap, and Td Vaccines (3 - Td) 03/13/2020   . Osteoporosis Screening  06/19/2020   . Breast Cancer Screening  08/15/2020   . Colorectal Cancer Screening  04/02/2021   . Diabetes Screening  06/19/2021   . Lipid Disorders Screening  08/24/2022   . Pneumococcal Vaccine: 65+ years  Completed   . Hepatitis C Screening  Completed   . Hepatitis B Vaccine  Aged Out   . Pneumococcal Vaccine: Pediatrics (0-5 years) and At-Risk Patients (6-64 years)  Aged Out       PCP Verified?  Yes, El-Attar, Hanley Seamen, MD

## 2018-10-13 NOTE — Patient Instructions (Addendum)
You may be a candidate for the following screening and prevention measures:    Health Maintenance   Topic Date Due   . Zoster Vaccine (1 of 2) 03/02/2001   . Medicare Annual Wellness Visit  09/02/2018   . Influenza Vaccine (1) 10/19/2018   . Depression Screening (PHQ-2)  10/13/2019   . DTaP, Tdap, and Td Vaccines (3 - Td) 03/13/2020   . Osteoporosis Screening  06/19/2020   . Breast Cancer Screening  08/15/2020   . Colorectal Cancer Screening  04/02/2021   . Diabetes Screening  06/19/2021   . Lipid Disorders Screening  08/24/2022   . Pneumococcal Vaccine: 65+ years  Completed   . Hepatitis C Screening  Completed   . Hepatitis B Vaccine  Aged Out   . Pneumococcal Vaccine: Pediatrics (0-5 years) and At-Risk Patients (6-64 years)  Aged Out       It is very important to maintain a healthy lifestyle.  This begins with eating a balanced diet that includes daily servings of fruit, vegetables and whole grains.  It is much healthier to prepare your own meals.  Try to avoid fast food and sugared drinks.  Maintaining an active lifestyle is also important.  This includes 3-4 days of an exercise that increases your heart rate (cardio) for at least 20-30 minutes.  Examples of cardiovascular exercise are walking, jogging, swimming, biking.  Stretching exercises such as yoga and working with weights can also have great benefit.     Specific health issues that are important for you to manage are:    1. Atrial Fibrillation

## 2018-10-13 NOTE — Progress Notes (Signed)
ANNUAL WELLNESS VISIT    Caitlin Wiley is a 68 year old female who presents for an Annual Wellness Visit.   []  Initial   [x]  Subsequent     INFORMATION GATHERING:  The following areas were confirmed with patient/caregiver and/or updated in Epic at this visit (required):    [x]  Past Medical History   []  Past Surgical History   []  Family History   [x]  Social History    [x]  Current medications and supplements (including vitamins and calcium)   [x]  Allergies    The information below is up to date at the end of this  visit (required):  Review of functional ability, hearing, fall risk and safety, diet, physical activity, and health habits  (via HRA or direct review with patient or caregiver)    The Health Risk Assessment (HRA) for today's visit (required) was completed (check one):   [x]   as Patient-Entered Questionnaire via eCare (visible in eCare Questionnaire                     Encounter and in Synopsis)   []  as paper HRA form, completed by or with the patient or caregiver (reminder:                    paper form, if completed, must be scanned to Media)   []  in HRA template documentation, completed by staff or provider with patient or  caregiver at visit (for use when HRA was not completed prior to rooming)      List of current providers and suppliers (check all that apply):   []   See Care Team Section   [x]   See EHR Encounters for [facility] Providers involved in care   []   Other providers and suppliers outside [facility]     Depression screening:  PHQ-2: 0      EXAM:  BP 101/67   Pulse 92   Temp 97.9 F (36.6 C) (Temporal)   Resp 16   Ht 5' 4.57" (1.64 m)   Wt (!) 257 lb (116.6 kg)   LMP  (LMP Unknown)   SpO2 97%   BMI 43.34 kg/m   GAIT:    [x]  Normal, stable, independent    []  Abnormal (describe):    As assessed by:     [x]  Direct Observation     []  Timed Up and Go     []  Other:  COGNITION:    [x]  Intact    []  Abnormal (describe):     As assessed by:      [x]  Direct Observation     []  Brief  Cognitive Screen      ADVANCE CARE PLANNING (ACP) (optional)  Advance care planning:   []  Patient Accepted   [x]  Patient Declined     Check all that apply:   []  Advance directives are on file in her chart   []  Explained & discussed advance directives at this visit   []  Completed advance care planning form(s) at this visit   []  Other Advance Care Planning discussion at this visit (describe):       Time Spent on ACP: []  None       []  1-15 minutes      []  16-30 minutes      []  >30 minutes      ASSESSMENT:  Caitlin Wiley was seen for her Annual Wellness Visit, including identification of risk factors & conditions that may affect her health and function in  the future.   (Z00.00) Routine general medical examination at a health care facility  (primary encounter diagnosis)    (R60.0) Edema of both legs  Plan: triamterene-hydroCHLOROthiazide 37.5-25 MG         tablet    (J45.20) Mild intermittent asthma without complication  Plan: montelukast 10 MG tablet    (K21.9) Gastroesophageal reflux disease without esophagitis  Plan: famotidine 20 MG tablet       Actions at this visit (all 3 required):   [x]   Establishing or updating a written schedule of screening and prevention  measures recommended and appropriate for Caitlin Wiley for the next 5-10 years   [x]   Establishing or updating a list of her risk factors and conditions for which  lifestyle or medical interventions are recommended or underway, including  mental health risks and conditions, and including risks/benefits of treatment   [x]   Furnishing personalized health advice and, as appropriate, referrals to health  education or preventive counseling services or programs (such as fall  prevention, tobacco cessation, physical activity, nutrition, weight loss)      COUNSELING:      Here are screening & prevention measures recommended for you:  Health Maintenance   Topic Date Due   . Zoster Vaccine (1 of 2) 03/02/2001   . Medicare Annual Wellness Visit   09/02/2018   . Influenza Vaccine (1) 10/19/2018   . Depression Screening (PHQ-2)  10/13/2019   . DTaP, Tdap, and Td Vaccines (3 - Td) 03/13/2020   . Osteoporosis Screening  06/19/2020   . Breast Cancer Screening  08/15/2020   . Colorectal Cancer Screening  04/02/2021   . Diabetes Screening  06/19/2021   . Lipid Disorders Screening  08/24/2022   . Pneumococcal Vaccine: 65+ years  Completed   . Hepatitis C Screening  Completed   . Hepatitis B Vaccine  Aged Out   . Pneumococcal Vaccine: Pediatrics (0-5 years) and At-Risk Patients (6-64 years)  Aged Out       It is very important to maintain a healthy lifestyle.  This begins with eating a balanced diet that includes daily servings of fruit, vegetables and whole grains.  It is much healthier to prepare your own meals.  Try to avoid fast food and sugared drinks.  Maintaining an active lifestyle is also important.  This includes 3-4 days of an exercise that increases your heart rate (cardio) for at least 20-30 minutes.  Examples of cardiovascular exercise are walking, jogging, swimming, biking.  Stretching exercises such as yoga and working with weights can also have great benefit.     Specific health issues that are important for you to manage are:    1. Atrial Fibrillation       Please plan to have a Subsequent Annual Wellness Visit in 1 year.

## 2018-10-16 NOTE — Progress Notes (Signed)
[place]  [address]  TEL: [phone]  l  FAX: [phone]    Follow Up Clinic Note  10/16/2018    IDENTIFYING DATA/CC:    Caitlin Wiley is a very pleasant 68 year old female who presents for follow up of rheumatoid arthritis, osteoarthritis.    RHEUMATOLOGIC HISTORY OF PRESENT ILLNESS:  Diagnosis:RA, OA; initial contult 11/30/2017  Presenting Symptoms/Timeline:2013 joint pain hands, wrists, knees, shoulders  Autoimmune Serologies:RF 140, CCP 16; CRP 17, ESR 23; UA 4.6  Pertinent Radiology:Xrays of hands, wrists, knees, feet: No erosions, consistent with OA, chondrocalcinosis  Medications Used:MXT 11/2017  Significant Flares:  Immunization Status: Influenza, PCV13, PPSV23, Zoster  Infectious Disease Screening: HepatitisB NR,Hepatitis C, TB quantiferon NR  Other: Afib on chronic anticoag,    INTERVAL HISTORY:  Patient is here for follow-up of rheumatoid arthritis.  She was last seen nearly 4 months ago at which time she was doing well.  She is on methotrexate 20 mg weekly for treatment of her inflammatory arthritis.  She also has osteoarthritis most notably in the knees.    Patient reports that she is doing well.  Methotrexate continues to work well for her.  She does have occasional increased joint pain involving her bilateral knees as well as her wrists.  Currently her wrist is bothersome but this is activity related as she spent some time yesterday sweeping her driveway.  Her knees were bothering her yesterday as well but improved with rest.  She is tolerating methotrexate without significant side effect.  She did have diarrheal episodes about a month ago but this has resolved. Denies infectious complications and she is taking strict COVID precautions.    REVIEW OF SYSTEMS:  Complete ROS is negative, except for those mentioned above in the Interval History    PROBLEM LIST:  Patient Active Problem List    Diagnosis Date Noted   . Rheumatoid arthritis  involving multiple sites with positive rheumatoid factor [M05.79] 02/08/2018   . Primary osteoarthritis of both knees [M17.0] 09/12/2017   . Chronic anticoagulation [Z79.01] 08/02/2015   . Right cervical radiculopathy [M54.12] 05/30/2015   . Morbid obesity with BMI of 40.0-44.9, adult ([place]) [E66.01, Z68.41] 05/30/2015   . Paroxysmal atrial fibrillation ([place]) [I48.0] 04/17/2014     A. S/p ablation ([place] [facility]) 10/19/2016     . GERD (gastroesophageal reflux disease) [K21.9] 11/08/2012   . OA (osteoarthritis) [M19.90] 07/28/2012   . Asthma [J45.909] 07/24/2012   . Edema [R60.9] 07/24/2012   . Sleep apnea [G47.30] 07/24/2012   . Pulmonary nodule [R91.1] 07/24/2012   . Grief reaction [F43.21] 07/24/2012     05/11/2011         SOCIAL/FAMILY HISTORY:  Reviewed with patient. No changes from what is currently noted in EPIC    ALLERGIES:  Review of patient's allergies indicates:  Allergies   Allergen Reactions   . Bee Venom Anaphylaxis   . Ciprofloxacin Other     Joint aching   . Pneumococcal Vaccines Hives   . Sulfa Antibiotics Hives   . Cats [Animals] Itching   . Dust Mite Extract Itching     DUST   . Pollen Extract Itching       MEDICATIONS:  Current Outpatient Medications   Medication Sig Dispense Refill   . Acetaminophen (TYLENOL) 325 MG Oral Tab Take 325 mg by mouth every 6 hours as needed.     [name] apixaban (Eliquis) 5 MG tablet Take  1 tablet (5 mg) by mouth 2 times a day. 180 tablet 3   . Cholecalciferol (VITAMIN D3 OR) Take by mouth.     . famotidine 20 MG tablet Take 1 tablet (20 mg) by mouth 2 times a day. 180 tablet 3   . Flaxseed, Linseed, (FLAXSEED OIL) 1200 MG Oral Cap None Entered     . folic acid 1 MG tablet Take 3 tablets (3 mg) by mouth daily. (Patient taking differently: Take 2 mg by mouth daily. ) 270 tablet 3   . GLUCOSAMINE CHONDROITIN COMPLX OR      . Magnesium 400 MG Oral Cap Take 400 mg by mouth.     . methotrexate 2.5 MG tablet Take 8 tablets (20 mg) by mouth every 7 days. 72 tablet 3   . montelukast 10 MG  tablet Take 1 tablet (10 mg) by mouth daily. 90 tablet 3   . Multiple Vitamins-Minerals (MULTIVITAMIN OR) no iron     . Probiotic Product (PROBIOTIC DAILY OR)      . triamterene-hydroCHLOROthiazide 37.5-25 MG tablet Take 1 tablet by mouth daily. 90 tablet 3   . Unclassified (OTHER MEDS, SEE COMMENTS,) Curamin oral tab daily     . VENTOLIN HFA 108 (90 Base) MCG/ACT inhaler INHALE ONE TO TWO PUFFS BY MOUTH EVERY 6 HOURS AS NEEDED FOR SHORTNESS OF BREATH /WHEEZING 18 g 2     No current facility-administered medications for this visit.        PHYSICAL EXAMINATION:  Vital signs:  BP 111/72   Pulse 77   Resp 16   Ht 5' 4.5" (1.638 m)   Wt (!) 257 lb (116.6 kg)   LMP  (LMP Unknown)   BMI 43.43 kg/m   General:  Awake, alert, and oriented, no apparent distress, pleasant, and cooperative  Psychologic:  Mood is euthymic, affect is congruent  EYES: Anicteric sclera, no conjunctival injection  ENT:  Normocephalic, atraumatic, moist membranes, no oral ulcers   Pulmonary:  Non-labored breathing, no wheezes, rhonchi, or rales  Cardiovascular:  RRR, no murmurs, rubs or gallops  Skin:  Normal temperature and texture, No visible rashes, ulcers, or nodules  Neurologic:  Light touch sensation is grossly intact. Face symmetric   Musculoskeletal:     Gait: Normal   Joints: No evidence of synovitis, joint swelling. Mild bogginess of the bilateral radial wrists. Mild tenderness with palpation of the radial wrists and CMC joints.   Nails: No pitting, clubbing, or cyanosis noted in the fingernails bilaterally    Muscle strength: Strength is grossly 5 out of 5 throughout the bilateral upper and lower extremities    LABS:    Pending    RADIOLOGY:   None    DISEASE ACTIVITY:  Patient Function: 1.3  Patient Pain Assessment: 2.5  Patient Global Assessment: 2   Combined Rapid 3 Score: 5.8    ASSESSMENT AND PLAN:  Caitlin Wiley is a very pleasant 67 year old female who presents for follow up of rheumatoid arthritis,  osteoarthritis.    #.Rheumatoid arthritis.Seropositive, nonerosive RA.Doingmuch better since MTX.We will continue her current dose for now. Is she continues to have stable disease and stable labs, she can continue on the current dose as long as she is tolerating it.     -continuemethotrexate 1m once a week  -continuefolic acid 156mdaily  - we discussed COVID-19 precautions    #. High risk medication. MTX toxicitylabs today.    #. Osteoarthritis. Most bothersome in the knees and is  activity related. She is interested in TKR but will need to lose some weight first which I am hopeful now that she is more active. She has tried PT and continues with the exercises.    #. Immunization. She is due for second pneumonia booster and her influenza vaccine. She will get her high dose flu vaccine today in clinic.    #. Bone health. Last DEXA from 2017 was normal. DEXA 06/2018 normal.     - optimization of calcium and vitamin D   - daily weight bearing exercises      All the patient's questions were answered. The patient will follow up in 4 months.    Dictated using Chief of Staff and electronically signed by Roxy Horseman, MD   Note: This document may contain errors inherent to this technology. If you find any errors that might affect patient care, please contact our office as soon as possible.   [address], [address], [place], [place]  Ph. [phone] Fx. [phone]

## 2018-10-17 ENCOUNTER — Ambulatory Visit: Payer: Medicare HMO | Attending: Rheumatology

## 2018-10-17 ENCOUNTER — Telehealth (INDEPENDENT_AMBULATORY_CARE_PROVIDER_SITE_OTHER): Payer: Self-pay | Admitting: Rheumatology

## 2018-10-17 ENCOUNTER — Ambulatory Visit (INDEPENDENT_AMBULATORY_CARE_PROVIDER_SITE_OTHER): Payer: Medicare HMO | Admitting: Rheumatology

## 2018-10-17 VITALS — BP 111/72 | HR 77 | Resp 16 | Ht 64.5 in | Wt 257.0 lb

## 2018-10-17 DIAGNOSIS — Z79899 Other long term (current) drug therapy: Secondary | ICD-10-CM

## 2018-10-17 DIAGNOSIS — Z23 Encounter for immunization: Secondary | ICD-10-CM

## 2018-10-17 DIAGNOSIS — M0579 Rheumatoid arthritis with rheumatoid factor of multiple sites without organ or systems involvement: Secondary | ICD-10-CM

## 2018-10-17 LAB — CBC, DIFF
% Basophils: 1 %
% Eosinophils: 2 %
% Immature Granulocytes: 0 %
% Lymphocytes: 22 %
% Monocytes: 5 %
% Neutrophils: 70 %
% Nucleated RBC: 0 %
Absolute Eosinophil Count: 0.2 10*3/uL (ref 0.00–0.50)
Absolute Lymphocyte Count: 2.11 10*3/uL (ref 1.00–4.80)
Basophils: 0.06 10*3/uL (ref 0.00–0.20)
Hematocrit: 45 % (ref 36–45)
Hemoglobin: 14.6 g/dL (ref 11.5–15.5)
Immature Granulocytes: 0.03 10*3/uL (ref 0.00–0.05)
MCH: 29.4 pg (ref 27.3–33.6)
MCHC: 32.8 g/dL (ref 32.2–36.5)
MCV: 90 fL (ref 81–98)
Monocytes: 0.5 10*3/uL (ref 0.00–0.80)
Neutrophils: 6.84 10*3/uL (ref 1.80–7.00)
Nucleated RBC: 0 10*3/uL
Platelet Count: 326 10*3/uL (ref 150–400)
RBC: 4.96 10*6/uL (ref 3.80–5.00)
RDW-CV: 14.2 % (ref 11.6–14.4)
WBC: 9.74 10*3/uL (ref 4.3–10.0)

## 2018-10-17 LAB — COMPREHENSIVE METABOLIC PANEL
ALT (GPT): 13 U/L (ref 7–33)
AST (GOT): 16 U/L (ref 9–38)
Albumin: 4 g/dL (ref 3.5–5.2)
Alkaline Phosphatase (Total): 74 U/L (ref 38–172)
Anion Gap: 9 (ref 4–12)
Bilirubin (Total): 0.8 mg/dL (ref 0.2–1.3)
Calcium: 9.5 mg/dL (ref 8.9–10.2)
Carbon Dioxide, Total: 25 meq/L (ref 22–32)
Chloride: 104 meq/L (ref 98–108)
Creatinine: 0.86 mg/dL (ref 0.38–1.02)
Glucose: 92 mg/dL (ref 62–125)
Potassium: 4.1 meq/L (ref 3.6–5.2)
Protein (Total): 6.2 g/dL (ref 6.0–8.2)
Sodium: 138 meq/L (ref 135–145)
Urea Nitrogen: 22 mg/dL — ABNORMAL HIGH (ref 8–21)
eGFR by CKD-EPI: >60 mL/min/{1.73_m2} (ref >59)

## 2018-10-17 NOTE — Progress Notes (Signed)
Patient came back into clinic, asked to have labs reordered in Dr. Molly Maduro name. Labs signed.

## 2018-10-17 NOTE — Patient Instructions (Signed)
It was a pleasure seeing you in clinic today.    Continue your current medications  Get labs after your appointment today  Follow up in 4 months      If you have any questions or concerns before your next appointment, please do not hesitate to contact me. Our clinic phone number is [phone] and I am also available via eCare.

## 2018-10-17 NOTE — Addendum Note (Signed)
Addended by: Wilber Oliphant on: 10/17/2018 11:21 AM     Modules accepted: Orders

## 2018-10-17 NOTE — Progress Notes (Signed)
Flu Screening Questions:    1) Currently have a moderate or severe illness, including fever? NO    2) Ever had a serious allergic reaction to eggs?  NO    3) Ever had a serious reaction to a flu vaccine or another vaccine in the past? NO    4) Ever had Guillain-Barre syndrome associated with a vaccine? NO    5) Less than 6 months old? NO    If YES to any of the questions above - NO Flu Vaccine to be given.  Patient may consult provider as needed.    If NO to all questions above - Patient may receive Flu Shot (IM)

## 2018-10-17 NOTE — Telephone Encounter (Signed)
Rose from the [facility] called, the pt is downstairs, but there are no labs in the system, please enter labs for the patient.

## 2018-12-18 ENCOUNTER — Other Ambulatory Visit (HOSPITAL_BASED_OUTPATIENT_CLINIC_OR_DEPARTMENT_OTHER): Payer: Self-pay | Admitting: Cardiovascular Disease

## 2018-12-18 DIAGNOSIS — I480 Paroxysmal atrial fibrillation: Secondary | ICD-10-CM

## 2018-12-20 MED ORDER — APIXABAN 5 MG OR TABS
5.0000 mg | ORAL_TABLET | Freq: Two times a day (BID) | ORAL | 2 refills | Status: DC
Start: 2018-12-20 — End: 2019-08-10

## 2019-01-09 ENCOUNTER — Ambulatory Visit: Payer: Medicare HMO | Attending: Cardiovascular Disease | Admitting: Cardiovascular Disease

## 2019-01-09 DIAGNOSIS — I10 Essential (primary) hypertension: Secondary | ICD-10-CM | POA: Insufficient documentation

## 2019-01-09 DIAGNOSIS — I480 Paroxysmal atrial fibrillation: Secondary | ICD-10-CM | POA: Insufficient documentation

## 2019-01-09 NOTE — Patient Instructions (Signed)
Overall you seem to be doing quite well.  Your blood pressure is well controlled and you have not had recurrent atrial fibrillation.  We discussed weight loss with a goal of 230 pounds.  Please see the JACC article on that [place] published recently.  In particular take a look at the food pyramid.    Your exercise is limited by the chronic knee pain.  We discussed doing more exercises on the floor including core exercises, writing the air bike, and adding arm exercises at the same time.  Always keep the low back flat on the ground.     Please report recurrent atrial fibrillation, otherwise I will plan to see you in 1 year.

## 2019-01-11 NOTE — Progress Notes (Signed)
[facility] Telemedicine Encounter    I conducted this encounter from [employer] via secure, live, face-to-face video conference with the patient. Caitlin Wiley was located at home with no other participants.  Prior to the interview, the risks and benefits of telemedicine were discussed with the patient and verbal consent was obtained.      [name] is a 68 year old woman with paroxysmal atrial fibrillation and hypertension in the setting of sleep apnea and rheumatoid arthritis.  She underwent atrial fibrillation ablation October 2018 and was treated with sotalol until December 2018.  She has remained on long-term oral anticoagulation.  An event monitor in the fall 2019 showed short episodes of atrial fibrillation lasting 17 seconds or less.    She reports good control of blood pressure with blood pressure readings today 123-125/70-71 with heart rate in the 70s.  She denies episodes of atrial fibrillation or any bothersome palpitations.  She denies bleeding, TIA symptoms, syncope or lightheadedness.  Walking is limited by leg/knee pain.  Her current weight is 265 pounds by her report.  She describes waking up at night and in the morning with numbness in her hands in an ulnar distribution which resolves with position change.    Review of patient's allergies indicates:  Allergies   Allergen Reactions   . Bee Venom Anaphylaxis   . Ciprofloxacin Other     Joint aching   . Pneumococcal Vaccines Hives   . Sulfa Antibiotics Hives   . Cats [Animals] Itching   . Dust Mite Extract Itching     DUST   . Pollen Extract Itching     Current Outpatient Medications   Medication Sig Dispense Refill   . Acetaminophen (TYLENOL) 325 MG Oral Tab Take 325 mg by mouth every 6 hours as needed.     [name] apixaban (Eliquis) 5 MG tablet Take 1 tablet (5 mg) by mouth 2 times a day. 180 tablet 2   . Cholecalciferol (VITAMIN D3 OR) Take by mouth.     . famotidine 20 MG tablet Take 1 tablet (20 mg) by mouth 2 times a day. 180 tablet 3   . Flaxseed,  Linseed, (FLAXSEED OIL) 1200 MG Oral Cap None Entered     . folic acid 1 MG tablet Take 3 tablets (3 mg) by mouth daily. (Patient taking differently: Take 2 mg by mouth daily. ) 270 tablet 3   . GLUCOSAMINE CHONDROITIN COMPLX OR      . Magnesium 400 MG Oral Cap Take 400 mg by mouth.     . methotrexate 2.5 MG tablet Take 8 tablets (20 mg) by mouth every 7 days. 72 tablet 3   . montelukast 10 MG tablet Take 1 tablet (10 mg) by mouth daily. 90 tablet 3   . Multiple Vitamins-Minerals (MULTIVITAMIN OR) no iron     . Probiotic Product (PROBIOTIC DAILY OR)      . SIMETHICONE-80 OR Take 2-3 tablets by mouth daily as needed.     . triamterene-hydroCHLOROthiazide 37.5-25 MG tablet Take 1 tablet by mouth daily. 90 tablet 3   . Unclassified (OTHER MEDS, SEE COMMENTS,) Curamin oral tab daily     . VENTOLIN HFA 108 (90 Base) MCG/ACT inhaler INHALE ONE TO TWO PUFFS BY MOUTH EVERY 6 HOURS AS NEEDED FOR SHORTNESS OF BREATH /WHEEZING 18 g 2     No current facility-administered medications for this visit.      Patient Active Problem List   Diagnosis   . Asthma   . Edema   .  Sleep apnea   . Pulmonary nodule   . Grief reaction   . OA (osteoarthritis)   . GERD (gastroesophageal reflux disease)   . Paroxysmal atrial fibrillation (HCC)   . Right cervical radiculopathy   . Morbid obesity with BMI of 40.0-44.9, adult ([place])   . Chronic anticoagulation   . Primary osteoarthritis of both knees   . Rheumatoid arthritis involving multiple sites with positive rheumatoid factor     Labs: CBC and comprehensive metabolic panel and late September were within normal limits.    A/P:  (I48.0) Paroxysmal atrial fibrillation (HCC)  (primary encounter diagnosis)  No clinical recurrence of atrial fibrillation, no bothersome palpitations, and no bleeding or TIA symptoms on chronic anticoagulation.  Plan: Continue present therapy    (I10) Essential hypertension  Blood pressure is well controlled.  Physical activity is limited by knee pain, and I encouraged her  to improve her diet and lose weight if possible.  We discussed the [place] Mediterranean diet, and I referred her to a recent article in the Journal of the [employer] of cardiology.  I recommended that she do more recumbent exercises including core exercises.  We discussed flattening her back on the ground with her legs in the air and pretending she is riding a bicycle.  She could also add arm exercises with light weights.  Plan: Continue to monitor blood pressure, diet and exercise counseling.

## 2019-02-01 ENCOUNTER — Encounter (INDEPENDENT_AMBULATORY_CARE_PROVIDER_SITE_OTHER): Payer: Self-pay | Admitting: Family Medicine

## 2019-02-01 NOTE — Telephone Encounter (Signed)
Forward to covering provider, please advise on e-care message  Regarding foot issue.

## 2019-02-03 NOTE — Telephone Encounter (Signed)
Agree.  Patient needs appointment.

## 2019-02-03 NOTE — Telephone Encounter (Signed)
Scheduled.  Closing TE.

## 2019-02-03 NOTE — Telephone Encounter (Signed)
Patient should have an appointment to evaluate her foot pain

## 2019-02-06 ENCOUNTER — Encounter (INDEPENDENT_AMBULATORY_CARE_PROVIDER_SITE_OTHER): Payer: Self-pay | Admitting: Nurse Practitioner

## 2019-02-06 ENCOUNTER — Telehealth (INDEPENDENT_AMBULATORY_CARE_PROVIDER_SITE_OTHER): Payer: Medicare HMO | Admitting: Nurse Practitioner

## 2019-02-06 DIAGNOSIS — M79672 Pain in left foot: Secondary | ICD-10-CM

## 2019-02-06 NOTE — Progress Notes (Signed)
Primary Care Telemedicine   02/06/19   Primary care provider: Jerald Kief, MD  Subjective   Caitlin Wiley is a 69 year old female with the following concerns:     Left foot pain for a few weeks, exacerbated by working in the garden, walking on uneven surfaces.    History of bone spur   Often barefoot in the house   She's tried stretching which helps a bit   No trauma   Pain mostly near heel, extending towards 5th toe.    Pain increased in the morning    Review of Systems   Constitutional: Negative.    Musculoskeletal:        Left foot pain     I reviewed the patient's recorded medical history, and confirmed the following: medications, problem list, allergies and past medical history    Patient Active Problem List   Diagnosis   . Asthma   . Edema   . Sleep apnea   . Pulmonary nodule   . Grief reaction   . OA (osteoarthritis)   . GERD (gastroesophageal reflux disease)   . Paroxysmal atrial fibrillation (HCC)   . Right cervical radiculopathy   . Morbid obesity with BMI of 40.0-44.9, adult ([place])   . Chronic anticoagulation   . Primary osteoarthritis of both knees   . Rheumatoid arthritis involving multiple sites with positive rheumatoid factor       Current Outpatient Medications:   .  Acetaminophen (TYLENOL) 325 MG Oral Tab, Take 325 mg by mouth every 6 hours as needed., Disp: , Rfl:   .  apixaban (Eliquis) 5 MG tablet, Take 1 tablet (5 mg) by mouth 2 times a day., Disp: 180 tablet, Rfl: 2  .  Cholecalciferol (VITAMIN D3 OR), Take by mouth., Disp: , Rfl:   .  famotidine 20 MG tablet, Take 1 tablet (20 mg) by mouth 2 times a day., Disp: 180 tablet, Rfl: 3  .  Flaxseed, Linseed, (FLAXSEED OIL) 1200 MG Oral Cap, None Entered, Disp: , Rfl:   .  folic acid 1 MG tablet, Take 3 tablets (3 mg) by mouth daily. (Patient taking differently: Take 2 mg by mouth daily. ), Disp: 270 tablet, Rfl: 3  .  GLUCOSAMINE CHONDROITIN COMPLX OR, , Disp: , Rfl:    .  Magnesium 400 MG Oral Cap, Take 400 mg by mouth., Disp: , Rfl:   .  methotrexate 2.5 MG tablet, Take 8 tablets (20 mg) by mouth every 7 days., Disp: 72 tablet, Rfl: 3  .  montelukast 10 MG tablet, Take 1 tablet (10 mg) by mouth daily., Disp: 90 tablet, Rfl: 3  .  Multiple Vitamins-Minerals (MULTIVITAMIN OR), no iron, Disp: , Rfl:   .  Probiotic Product (PROBIOTIC DAILY OR), , Disp: , Rfl:   .  SIMETHICONE-80 OR, Take 2-3 tablets by mouth daily as needed., Disp: , Rfl:   .  triamterene-hydroCHLOROthiazide 37.5-25 MG tablet, Take 1 tablet by mouth daily., Disp: 90 tablet, Rfl: 3  .  Unclassified (OTHER MEDS, SEE COMMENTS,), Curamin oral tab daily, Disp: , Rfl:   .  VENTOLIN HFA 108 (90 Base) MCG/ACT inhaler, INHALE ONE TO TWO PUFFS BY MOUTH EVERY 6 HOURS AS NEEDED FOR SHORTNESS OF BREATH /WHEEZING, Disp: 18 g, Rfl: 2    Objective   Physical Exam    Deferred considering nature of visit.   Patient alert, no distress.     Assessment & Plan   Caitlin Wiley is a 69 year old  female with:    Foot pain, left     History consistent with likely plantar fasciitis. First-line therapy will be conservative management with home stretching, supportive footwear, and avoidance of reinjury. If worsens or clinical course is prolonged, may consider imaging to rule out stress fracture or referral for steroid injection if patient agrees -- currently she would not like to pursue anything involving a procedure or surgery.    Instructions for home care outlined in the after visit summary.      Advised to seek medical care if symptoms increase, persist, or worsen.   Diagnosis, treatments, risks and alternatives discussed with patient and questions answered.      Fayne Mediate, MPH, ARNP  Nurse Practitioner  [place]    Distant Site Telemedicine Encounter   I conducted this encounter from [facility] via secure, live, face-to-face video conference with the patient. Francis was located at home . Prior to the interview, the risks and benefits of telemedicine were discussed with the patient and verbal consent was obtained.

## 2019-02-06 NOTE — Progress Notes (Signed)
Patient c/o intermittent left foot pain outside of heel & ball of foot. Has been working out in garden a lot.  Onset: last couple weeks has been sore, she's not sure if it's related to her bone spur

## 2019-02-06 NOTE — Patient Instructions (Addendum)
Plantar Fasciitis    Is there anything I can do on my own to feel better? - Yes, you can:    ?Rest - Give your foot a chance to heal by resting. But don't completely stop being active. Doing that can lead to more pain and stiffness in the long run.    ?Ice your foot - Putting ice on your heel for 20 minutes up to 4 times a day might relieve pain. Icing and massaging your foot before exercise might also help.    ?Do special foot exercises - Certain exercises can help with heel pain. Do these exercises every day. Do these first thing in the morning, before walking, to prevent continued injury.     ?Wear sturdy shoes - Sneakers with a lot of cushion and good arch and heel support are best. Shoes with rigid soles can also help. Adding padded or gel heel inserts to your shoes might help, too.        Exercises that can help with heel pain                Rolling your foot on a rolling pin is another option.       ___________________________________________________________________________________      What causes heel pain? - One of the most common causes of heel pain is a problem called "plantar fasciitis." Plantar fasciitis is the term doctors use when a part of the foot called the plantar fascia gets irritated.     The plantar fascia is a tough band of tissue that connects the heel bone to the toes (See below).    Heel pain caused by plantar fasciitis is very common. It often affects people who run, jump, or stand for long periods. Most people who get this type of heel pain get better within a year even if they do not get treated.

## 2019-02-11 ENCOUNTER — Other Ambulatory Visit (INDEPENDENT_AMBULATORY_CARE_PROVIDER_SITE_OTHER): Payer: Self-pay | Admitting: Rheumatology

## 2019-02-11 ENCOUNTER — Other Ambulatory Visit (INDEPENDENT_AMBULATORY_CARE_PROVIDER_SITE_OTHER): Payer: Self-pay | Admitting: Family Medicine

## 2019-02-11 DIAGNOSIS — R0602 Shortness of breath: Secondary | ICD-10-CM

## 2019-02-11 DIAGNOSIS — M0579 Rheumatoid arthritis with rheumatoid factor of multiple sites without organ or systems involvement: Secondary | ICD-10-CM

## 2019-02-11 DIAGNOSIS — J4520 Mild intermittent asthma, uncomplicated: Secondary | ICD-10-CM

## 2019-02-12 MED ORDER — METHOTREXATE SODIUM 2.5 MG OR TABS
ORAL_TABLET | ORAL | 3 refills | Status: DC
Start: 2019-02-12 — End: 2019-10-23

## 2019-02-12 NOTE — Telephone Encounter (Signed)
Routing to provider for review and approval.      Last Labs: 10/17/2018  Last Office visit: 10/17/2018  Next Appointment: 02/27/2019

## 2019-02-13 MED ORDER — ALBUTEROL SULFATE HFA 108 (90 BASE) MCG/ACT IN AERS
INHALATION_SPRAY | RESPIRATORY_TRACT | 2 refills | Status: DC
Start: 2019-02-13 — End: 2020-01-31

## 2019-02-22 ENCOUNTER — Telehealth (HOSPITAL_BASED_OUTPATIENT_CLINIC_OR_DEPARTMENT_OTHER): Payer: Self-pay | Admitting: Cardiovascular Disease

## 2019-02-22 NOTE — Telephone Encounter (Signed)
Called Caitlin Wiley and she reports since Friday "skipping beats" started in the afternoon but continues to be intermittent.  Her vitals seem stable at  BP 125/72 HR 70-80 when she is not having the symptoms.    Saturday no symptoms feeling fine.  Sunday evening she had a few minutes.  Tuesday she had a couple, and by the time she put the cuff on the symptoms resolved.  She is having intestinal issues and some sleep issues.  She had right lower abdomen pain which she attributes to her gall bladder issues.  She has had these symptoms in the past which turned out to be PVCs.  She wore a monitor in September 2020 for 2 days and this was reviewed by Dr Nicole Kindred during her Dec Telemedicine appointment.  She is reluctant in coming in for EKG, but also knew she needed to report this to Dr Nicole Kindred.    Cardiac medications:  Apixaban 5 mg BID  Triamterene-Hydrochlorothiazide 37.5-25 mg tabs daily

## 2019-02-22 NOTE — Telephone Encounter (Signed)
Pt called to report she's been having what she feels like an afib since last Fri but it's been on and off. Also light headedness but no other symptoms.     Thank you,  Pennie Rushing  PSS Lead

## 2019-02-22 NOTE — Telephone Encounter (Signed)
History is more consistent with premature beats, not atrial fibrillation.  Please have her continue to monitor her symptoms for now.  Hopefully her sleep and GI issues will improve.

## 2019-02-23 NOTE — Telephone Encounter (Signed)
Called patient to let her know

## 2019-02-27 ENCOUNTER — Ambulatory Visit (INDEPENDENT_AMBULATORY_CARE_PROVIDER_SITE_OTHER): Payer: Medicare HMO | Admitting: Rheumatology

## 2019-02-27 ENCOUNTER — Ambulatory Visit: Payer: Medicare HMO | Attending: Rheumatology

## 2019-02-27 VITALS — BP 129/82 | HR 96 | Resp 16 | Wt 271.0 lb

## 2019-02-27 DIAGNOSIS — Z79899 Other long term (current) drug therapy: Secondary | ICD-10-CM

## 2019-02-27 DIAGNOSIS — M0579 Rheumatoid arthritis with rheumatoid factor of multiple sites without organ or systems involvement: Secondary | ICD-10-CM | POA: Insufficient documentation

## 2019-02-27 DIAGNOSIS — M170 Bilateral primary osteoarthritis of knee: Secondary | ICD-10-CM

## 2019-02-27 LAB — CBC, DIFF
% Basophils: 1 %
% Eosinophils: 2 %
% Immature Granulocytes: 0 %
% Lymphocytes: 21 %
% Monocytes: 5 %
% Neutrophils: 71 %
% Nucleated RBC: 0 %
Absolute Eosinophil Count: 0.17 10*3/uL (ref 0.00–0.50)
Absolute Lymphocyte Count: 2.03 10*3/uL (ref 1.00–4.80)
Basophils: 0.08 10*3/uL (ref 0.00–0.20)
Hematocrit: 46 % — ABNORMAL HIGH (ref 36–45)
Hemoglobin: 15 g/dL (ref 11.5–15.5)
Immature Granulocytes: 0.04 10*3/uL (ref 0.00–0.05)
MCH: 29 pg (ref 27.3–33.6)
MCHC: 32.6 g/dL (ref 32.2–36.5)
MCV: 89 fL (ref 81–98)
Monocytes: 0.52 10*3/uL (ref 0.00–0.80)
Neutrophils: 6.97 10*3/uL (ref 1.80–7.00)
Nucleated RBC: 0 10*3/uL
Platelet Count: 318 10*3/uL (ref 150–400)
RBC: 5.18 10*6/uL — ABNORMAL HIGH (ref 3.80–5.00)
RDW-CV: 14.7 % — ABNORMAL HIGH (ref 11.6–14.4)
WBC: 9.81 10*3/uL (ref 4.3–10.0)

## 2019-02-27 LAB — COMPREHENSIVE METABOLIC PANEL
ALT (GPT): 12 U/L (ref 7–33)
AST (GOT): 15 U/L (ref 9–38)
Albumin: 4.3 g/dL (ref 3.5–5.2)
Alkaline Phosphatase (Total): 70 U/L (ref 38–172)
Anion Gap: 9 (ref 4–12)
Bilirubin (Total): 0.7 mg/dL (ref 0.2–1.3)
Calcium: 9.6 mg/dL (ref 8.9–10.2)
Carbon Dioxide, Total: 26 meq/L (ref 22–32)
Chloride: 104 meq/L (ref 98–108)
Creatinine: 0.94 mg/dL (ref 0.38–1.02)
Glucose: 96 mg/dL (ref 62–125)
Potassium: 4.2 meq/L (ref 3.6–5.2)
Protein (Total): 6.6 g/dL (ref 6.0–8.2)
Sodium: 139 meq/L (ref 135–145)
Urea Nitrogen: 19 mg/dL (ref 8–21)
eGFR by CKD-EPI: >60 mL/min/{1.73_m2} (ref >59)

## 2019-02-27 LAB — C_REACTIVE PROTEIN: C_Reactive Protein: 5.6 mg/L (ref 0.0–10.0)

## 2019-02-27 LAB — SED RATE: Erythrocyte Sedimentation Rate: 11 mm/h (ref 0–20)

## 2019-02-27 NOTE — Patient Instructions (Signed)
It was a pleasure seeing you in clinic today.    Continue your current medications  Get labs after your appointment today  Follow up in 4 months      If you have any questions or concerns before your next appointment, please do not hesitate to contact me. Our clinic phone number is [phone] and I am also available via eCare.

## 2019-02-27 NOTE — Progress Notes (Signed)
[place]  [address]  TEL: [phone]  l  FAX: [phone]    Follow Up Clinic Note  02/27/2019    IDENTIFYING DATA/CC:    Caitlin Wiley is a very pleasant 69 year old female who presents for follow up of rheumatoid arthritis, osteoarthritis.    RHEUMATOLOGIC HISTORY OF PRESENT ILLNESS:  Diagnosis:RA, OA; initial contult 11/30/2017  Presenting Symptoms/Timeline:2013 joint pain hands, wrists, knees, shoulders  Autoimmune Serologies:RF 140, CCP 16; CRP 17, ESR 23; UA 4.6  Pertinent Radiology:Xrays of hands, wrists, knees, feet: No erosions, consistent with OA, chondrocalcinosis  Medications Used:MXT 11/2017  Significant Flares:  Immunization Status: Influenza, PCV13, PPSV23, Zoster  Infectious Disease Screening: HepatitisB NR,Hepatitis C, TB quantiferon NR  Other: Afib on chronic anticoag,    INTERVAL HISTORY:  Patient returns for follow-up of rheumatoid arthritis, osteoarthritis.  She was last 4 months ago at which time she was doing well.  She is on methotrexate monotherapy for treatment of her rheumatoid arthritis.    Patient reports increased stiffness and arthralgia in the past several weeks.  She noticed that the methotrexate does not seem to last the full 7 days.  Also noticed some pain in the left knee but only as she rise from a sitting position to standing.  There is a point during this movement when her knees are almost fully extended when she feels some pain.  However when she is upright and walking the pain improves.  No pain at rest.  No significant joint swelling, warmth.  She has 5 to 10 minutes of morning stiffness.  No new rash.  She continues to tolerate methotrexate.  No infectious complications.  She has an appointment for her first Covid vaccine.  This is next Thursday.    REVIEW OF SYSTEMS:  Please see patient intake form.    PROBLEM LIST:  Patient Active Problem List    Diagnosis Date Noted   . Rheumatoid arthritis involving  multiple sites with positive rheumatoid factor [M05.79] 02/08/2018   . Primary osteoarthritis of both knees [M17.0] 09/12/2017   . Chronic anticoagulation [Z79.01] 08/02/2015   . Right cervical radiculopathy [M54.12] 05/30/2015   . Morbid obesity with BMI of 40.0-44.9, adult ([place]) [E66.01, Z68.41] 05/30/2015   . Paroxysmal atrial fibrillation ([place]) [I48.0] 04/17/2014     A. S/p ablation ([facility]) 10/19/2016     . GERD (gastroesophageal reflux disease) [K21.9] 11/08/2012   . OA (osteoarthritis) [M19.90] 07/28/2012   . Asthma [J45.909] 07/24/2012   . Edema [R60.9] 07/24/2012   . Sleep apnea [G47.30] 07/24/2012   . Pulmonary nodule [R91.1] 07/24/2012   . Grief reaction [F43.21] 07/24/2012     05/11/2011         SOCIAL/FAMILY HISTORY:  Reviewed with patient. No changes from what is currently noted in EPIC    ALLERGIES:  Review of patient's allergies indicates:  Allergies   Allergen Reactions   . Bee Venom Anaphylaxis   . Ciprofloxacin Other     Joint aching   . Pneumococcal Vaccines Hives   . Sulfa Antibiotics Hives   . Cats [Animals] Itching   . Dust Mite Extract Itching     DUST   . Pollen Extract Itching       MEDICATIONS:  Current Outpatient Medications   Medication Sig Dispense Refill   . Acetaminophen (TYLENOL) 325 MG Oral Tab Take 325 mg by mouth every 6 hours as needed.     [name]  albuterol HFA 108 (90 Base) MCG/ACT inhaler INHALE ONE TO TWO PUFFS BY MOUTH EVERY 6 HOURS AS NEEDED FOR SHORTNESS OF BREATH, WHEEZING 18 g 2   . apixaban (Eliquis) 5 MG tablet Take 1 tablet (5 mg) by mouth 2 times a day. 180 tablet 2   . Cholecalciferol (VITAMIN D3 OR) Take by mouth.     . famotidine 20 MG tablet Take 1 tablet (20 mg) by mouth 2 times a day. 180 tablet 3   . Flaxseed, Linseed, (FLAXSEED OIL) 1200 MG Oral Cap None Entered     . folic acid 1 MG tablet Take 3 tablets (3 mg) by mouth daily. (Patient taking differently: Take 2 mg by mouth daily. ) 270 tablet 3   . GLUCOSAMINE CHONDROITIN COMPLX OR      . Magnesium 400 MG Oral  Cap Take 400 mg by mouth.     . methotrexate 2.5 MG tablet TAKE 8 TABLETS BY MOUTH EVERY 7 DAYS 96 tablet 3   . montelukast 10 MG tablet Take 1 tablet (10 mg) by mouth daily. 90 tablet 3   . Multiple Vitamins-Minerals (MULTIVITAMIN OR) no iron     . Probiotic Product (PROBIOTIC DAILY OR)      . SIMETHICONE-80 OR Take 2-3 tablets by mouth daily as needed.     . triamterene-hydroCHLOROthiazide 37.5-25 MG tablet Take 1 tablet by mouth daily. 90 tablet 3   . Unclassified (OTHER MEDS, SEE COMMENTS,) Curamin oral tab daily       No current facility-administered medications for this visit.        PHYSICAL EXAMINATION:  Vital signs:  BP 129/82   Pulse 96   Resp 16   Wt (!) 271 lb (122.9 kg)   LMP  (LMP Unknown)   BMI 45.80 kg/m   General:  Awake, alert, and oriented, no apparent distress, pleasant, and cooperative  Psychologic:  Mood is euthymic, affect is congruent  EYES: Anicteric sclera, no conjunctival injection  ENT:  Normocephalic, atraumatic, moist membranes, no oral ulcers   Pulmonary:  Non-labored breathing  Skin:  Normal temperature and texture, No visible rashes, ulcers, or nodules  Neurologic:  Light touch sensation is grossly intact. Face symmetric   Musculoskeletal:     Gait: Normal   Joints: No evidence of synovitis, joint swelling, or tenderness in the joints.   Nails: No pitting, clubbing, or cyanosis noted in the fingernails bilaterally    Muscle strength: Strength is grossly 5 out of 5 throughout the bilateral upper and lower extremities    LABS:    Pending    RADIOLOGY:   None    DISEASE ACTIVITY:  Patient Function: 2  Patient Pain Assessment: 4.5  Patient Global Assessment: 2.5   Combined Rapid 3 Score: 9    ASSESSMENT AND PLAN:  Caitlin Wiley is a very pleasant 69 year old female who presents for follow up of rheumatoid arthritis, osteoarthritis.    #.Rheumatoid arthritis.Seropositive, nonerosive RA.Doingmuch better since MTX but does not believe benefit lasts for 1 week.  No  evidence of synovitis on exam today.  Her left knee pain sounds mechanical in nature and she does have evidence of advanced osteoarthritis in both knees.  We discussed therapeutic options including corticosteroid or hyaluronic acid derivative injection.  Patient declines.  She could also try physical therapy although she is continuing some of her exercises at home.  She is a candidate for total knee replacement surgery once she is able to reach her target BMI.  Unfortunately she has gained weight during the pandemic.  Trying an injection may provide some relief where she can further participate in activity including exercise.  She will think about this and let us know.    -continuemethotrexate 3m once a week  -continuefolic acid 246mdaily  - we discussed COVID-19 precautions, she is due for her Covid vaccine next Thursday.  She will hold methotrexate dose this week prior to getting her vaccine next week.  She has some concerns about a flare.  We can address this with low-dose prednisone if necessary.    #. High risk medication. MTX toxicitylabstoday.    #. Osteoarthritis. Most bothersome in the knees and is activity related. She is interested in TKR but will need to lose some weight first which I am hopeful now that she is more active. She has tried PT and continues with the exercises.    #. Immunization. She is due for second pneumonia booster and her influenza vaccine. She will get her high dose flu vaccine today in clinic.    #. Bone health.Last DEXA from 2017 was normal. DEXA 06/2018 normal.    - optimization of calcium and vitamin D   - daily weight bearing exercises      All the patient's questions were answered. The patient will follow up in 3-4 months.    Dictated using spChief of Staffnd electronically signed by YuRoxy HorsemanMD   Note: This document may contain errors inherent to this technology. If you find any errors that might affect patient care, please contact our  office as soon as possible.   [id number]SuSavage Town[address], SeIndustryWaMoorestown-LenolaPh. [phone]

## 2019-03-07 ENCOUNTER — Ambulatory Visit (HOSPITAL_BASED_OUTPATIENT_CLINIC_OR_DEPARTMENT_OTHER): Payer: Medicare HMO

## 2019-03-07 ENCOUNTER — Encounter (INDEPENDENT_AMBULATORY_CARE_PROVIDER_SITE_OTHER): Payer: Self-pay | Admitting: Rheumatology

## 2019-03-07 DIAGNOSIS — Z23 Encounter for immunization: Secondary | ICD-10-CM

## 2019-03-23 ENCOUNTER — Encounter (INDEPENDENT_AMBULATORY_CARE_PROVIDER_SITE_OTHER): Payer: Self-pay | Admitting: Family Medicine

## 2019-03-31 ENCOUNTER — Ambulatory Visit (HOSPITAL_BASED_OUTPATIENT_CLINIC_OR_DEPARTMENT_OTHER): Payer: Self-pay

## 2019-03-31 DIAGNOSIS — Z23 Encounter for immunization: Secondary | ICD-10-CM

## 2019-04-06 ENCOUNTER — Encounter (INDEPENDENT_AMBULATORY_CARE_PROVIDER_SITE_OTHER): Payer: Self-pay

## 2019-04-12 ENCOUNTER — Telehealth (INDEPENDENT_AMBULATORY_CARE_PROVIDER_SITE_OTHER): Payer: Self-pay | Admitting: Family Medicine

## 2019-04-12 DIAGNOSIS — Z8371 Family history of colonic polyps: Secondary | ICD-10-CM

## 2019-04-12 DIAGNOSIS — Z86010 Personal history of colonic polyps: Secondary | ICD-10-CM

## 2019-04-12 NOTE — Telephone Encounter (Signed)
Order signed.

## 2019-04-12 NOTE — Telephone Encounter (Signed)
Received a fax from [facility] stating that they needs a referral for pt see them.     Order pended. Please advise.

## 2019-04-12 NOTE — Telephone Encounter (Signed)
Noted   Faxed referral to [facility]

## 2019-05-09 ENCOUNTER — Telehealth (INDEPENDENT_AMBULATORY_CARE_PROVIDER_SITE_OTHER): Payer: Self-pay | Admitting: Rheumatology

## 2019-05-09 NOTE — Telephone Encounter (Signed)
The Patient is scheduled for a colonoscopy on next Monday 05/14/19. This is the same date she is to take her next dose of Methotrexate.    She wants to know if she should take the methotrexate a day earlier on Sunday 05/13/19?

## 2019-05-09 NOTE — Telephone Encounter (Signed)
Spoke with patient, she'll wait to take it on Tuesday.

## 2019-05-10 ENCOUNTER — Other Ambulatory Visit (INDEPENDENT_AMBULATORY_CARE_PROVIDER_SITE_OTHER): Payer: Self-pay | Admitting: Rheumatology

## 2019-05-10 DIAGNOSIS — M0579 Rheumatoid arthritis with rheumatoid factor of multiple sites without organ or systems involvement: Secondary | ICD-10-CM

## 2019-05-11 MED ORDER — FOLIC ACID 1 MG OR TABS
2.0000 mg | ORAL_TABLET | Freq: Every day | ORAL | 3 refills | Status: DC
Start: 2019-05-11 — End: 2019-05-21

## 2019-05-11 NOTE — Telephone Encounter (Signed)
Routing to provider for review and approval.    Folic Acid 1 MG    Last Labs: 02/27/19  Last Office visit: 02/27/2019  Next Appointment: 06/26/2019    Per last visit note pt only takes 2 mg qd. Updated sig and quantity.

## 2019-05-14 ENCOUNTER — Encounter (INDEPENDENT_AMBULATORY_CARE_PROVIDER_SITE_OTHER): Payer: Self-pay | Admitting: Rheumatology

## 2019-05-19 ENCOUNTER — Encounter (INDEPENDENT_AMBULATORY_CARE_PROVIDER_SITE_OTHER): Payer: Self-pay | Admitting: Rheumatology

## 2019-05-19 DIAGNOSIS — M0579 Rheumatoid arthritis with rheumatoid factor of multiple sites without organ or systems involvement: Secondary | ICD-10-CM

## 2019-05-21 MED ORDER — FOLIC ACID 1 MG OR TABS
3.0000 mg | ORAL_TABLET | Freq: Every day | ORAL | 3 refills | Status: DC
Start: 2019-05-21 — End: 2019-10-23

## 2019-06-16 ENCOUNTER — Encounter (INDEPENDENT_AMBULATORY_CARE_PROVIDER_SITE_OTHER): Payer: Self-pay | Admitting: Family Medicine

## 2019-06-16 NOTE — Telephone Encounter (Signed)
Pt given # for CCL

## 2019-06-18 ENCOUNTER — Encounter (INDEPENDENT_AMBULATORY_CARE_PROVIDER_SITE_OTHER): Payer: Self-pay | Admitting: Rheumatology

## 2019-06-26 ENCOUNTER — Ambulatory Visit (INDEPENDENT_AMBULATORY_CARE_PROVIDER_SITE_OTHER): Payer: Medicare HMO | Admitting: Rheumatology

## 2019-06-26 ENCOUNTER — Ambulatory Visit: Payer: Medicare HMO | Attending: Rheumatology

## 2019-06-26 VITALS — BP 118/68 | HR 81 | Ht 64.5 in | Wt 278.0 lb

## 2019-06-26 DIAGNOSIS — Z79899 Other long term (current) drug therapy: Secondary | ICD-10-CM | POA: Insufficient documentation

## 2019-06-26 DIAGNOSIS — M170 Bilateral primary osteoarthritis of knee: Secondary | ICD-10-CM | POA: Insufficient documentation

## 2019-06-26 DIAGNOSIS — M0579 Rheumatoid arthritis with rheumatoid factor of multiple sites without organ or systems involvement: Secondary | ICD-10-CM

## 2019-06-26 LAB — CBC, DIFF
% Basophils: 1 %
% Eosinophils: 2 %
% Immature Granulocytes: 0 %
% Lymphocytes: 20 %
% Monocytes: 6 %
% Neutrophils: 71 %
% Nucleated RBC: 0 %
Absolute Eosinophil Count: 0.22 10*3/uL (ref 0.00–0.50)
Absolute Lymphocyte Count: 1.89 10*3/uL (ref 1.00–4.80)
Basophils: 0.08 10*3/uL (ref 0.00–0.20)
Hematocrit: 45 % (ref 36–45)
Hemoglobin: 14.4 g/dL (ref 11.5–15.5)
Immature Granulocytes: 0.03 10*3/uL (ref 0.00–0.05)
MCH: 28.5 pg (ref 27.3–33.6)
MCHC: 31.8 g/dL — ABNORMAL LOW (ref 32.2–36.5)
MCV: 90 fL (ref 81–98)
Monocytes: 0.58 10*3/uL (ref 0.00–0.80)
Neutrophils: 6.6 10*3/uL (ref 1.80–7.00)
Nucleated RBC: 0 10*3/uL
Platelet Count: 333 10*3/uL (ref 150–400)
RBC: 5.06 10*6/uL — ABNORMAL HIGH (ref 3.80–5.00)
RDW-CV: 14.8 % — ABNORMAL HIGH (ref 11.6–14.4)
WBC: 9.4 10*3/uL (ref 4.3–10.0)

## 2019-06-26 LAB — COMPREHENSIVE METABOLIC PANEL
ALT (GPT): 14 U/L (ref 7–33)
AST (GOT): 15 U/L (ref 9–38)
Albumin: 3.9 g/dL (ref 3.5–5.2)
Alkaline Phosphatase (Total): 65 U/L (ref 38–172)
Anion Gap: 9 (ref 4–12)
Bilirubin (Total): 0.6 mg/dL (ref 0.2–1.3)
Calcium: 9.3 mg/dL (ref 8.9–10.2)
Carbon Dioxide, Total: 26 meq/L (ref 22–32)
Chloride: 106 meq/L (ref 98–108)
Creatinine: 0.88 mg/dL (ref 0.38–1.02)
Glucose: 103 mg/dL (ref 62–125)
Potassium: 4 meq/L (ref 3.6–5.2)
Protein (Total): 6.4 g/dL (ref 6.0–8.2)
Sodium: 141 meq/L (ref 135–145)
Urea Nitrogen: 21 mg/dL (ref 8–21)
eGFR by CKD-EPI: >60 mL/min/{1.73_m2} (ref >59)

## 2019-06-26 LAB — SED RATE: Erythrocyte Sedimentation Rate: 12 mm/h (ref 0–20)

## 2019-06-26 LAB — C_REACTIVE PROTEIN: C_Reactive Protein: 6.9 mg/L (ref 0.0–10.0)

## 2019-06-26 NOTE — Progress Notes (Signed)
[place]  [address]  TEL: [phone]  l  FAX: [phone]    Follow Up Clinic Note  06/26/2019    IDENTIFYING DATA/CC:    Caitlin Wiley is a very pleasant 69 year old female who presents for follow up of rheumatoid arthritis, osteoarthritis.    RHEUMATOLOGIC HISTORY OF PRESENT ILLNESS:  Diagnosis:RA, OA; initial contult 11/30/2017  Presenting Symptoms/Timeline:2013 joint pain hands, wrists, knees, shoulders  Autoimmune Serologies:RF 140, CCP 16; CRP 17, ESR 23; UA 4.6  Pertinent Radiology:Xrays of hands, wrists, knees, feet: No erosions, consistent with OA, chondrocalcinosis  Medications Used:MXT 11/2017  Significant Flares:  Immunization Status: Influenza, PCV13, PPSV23, Zoster  Infectious Disease Screening: HepatitisB NR,Hepatitis C, TB quantiferon NR  Other: Afib on chronic anticoag,    INTERVAL HISTORY:  Patient is here for follow-up of rheumatoid arthritis and osteoarthritis.  She was last seen 4 months ago at which time she was reporting increased stiffness and arthralgia.  She stated methotrexate does not seem to last the full 7 days.  The characteristics of her arthralgia particularly in the knees sound mechanical.  She had no evidence of synovitis.  And her laboratory evaluation was normal.  We discussed physical therapy, injections.  Patient is awaiting joint replacement surgery once she is able to reach target BMI.    Patient reports stable disease.  Her knees continue to bother her with walking, prolonged standing but this is not changed.  She did have some lower back pain with sciatica down her right leg.  No joint swelling or warmth.  She has 5 minutes of morning stiffness.  She reports 20 pound weight gain this past 1 to 2 years.  She is tolerating her medications without significant side effects.  No infectious complications.  She did get both her Covid vaccine.  Had mild side effects such as sore arm, injection site reaction after the  first.  She did hold her methotrexate.    REVIEW OF SYSTEMS:  Complete ROS is negative, except for those mentioned above in the Interval History    PROBLEM LIST:  Patient Active Problem List    Diagnosis Date Noted   . Rheumatoid arthritis involving multiple sites with positive rheumatoid factor [M05.79] 02/08/2018   . Primary osteoarthritis of both knees [M17.0] 09/12/2017   . Chronic anticoagulation [Z79.01] 08/02/2015   . Right cervical radiculopathy [M54.12] 05/30/2015   . Morbid obesity with BMI of 40.0-44.9, adult ([place]) [E66.01, Z68.41] 05/30/2015   . Paroxysmal atrial fibrillation ([place]) [I48.0] 04/17/2014     A. S/p ablation ([facility]) 10/19/2016     . GERD (gastroesophageal reflux disease) [K21.9] 11/08/2012   . OA (osteoarthritis) [M19.90] 07/28/2012   . Asthma [J45.909] 07/24/2012   . Edema [R60.9] 07/24/2012   . Sleep apnea [G47.30] 07/24/2012   . Pulmonary nodule [R91.1] 07/24/2012   . Grief reaction [F43.21] 07/24/2012     05/11/2011         SOCIAL/FAMILY HISTORY:  Reviewed with patient. No changes from what is currently noted in EPIC    ALLERGIES:  Review of patient's allergies indicates:  Allergies   Allergen Reactions   . Bee Venom Anaphylaxis   . Ciprofloxacin Other     Joint aching   . Pneumococcal Vaccines Skin: Hives   . Sulfa Antibiotics Skin: Hives   . Cats [Animals] Skin: Itching   . Dust Mite Extract Skin: Itching     DUST   .  Pollen Extract Skin: Itching       MEDICATIONS:  Current Outpatient Medications   Medication Sig Dispense Refill   . Acetaminophen (TYLENOL) 325 MG Oral Tab Take 325 mg by mouth every 6 hours as needed.     [name] albuterol HFA 108 (90 Base) MCG/ACT inhaler INHALE ONE TO TWO PUFFS BY MOUTH EVERY 6 HOURS AS NEEDED FOR SHORTNESS OF BREATH, WHEEZING 18 g 2   . apixaban (Eliquis) 5 MG tablet Take 1 tablet (5 mg) by mouth 2 times a day. 180 tablet 2   . Cholecalciferol (VITAMIN D3 OR) Take by mouth.     . famotidine 20 MG tablet Take 1 tablet (20 mg) by mouth 2 times a day. 180  tablet 3   . Flaxseed, Linseed, (FLAXSEED OIL) 1200 MG Oral Cap None Entered     . folic acid 1 MG tablet Take 3 tablets (3 mg) by mouth daily. 270 tablet 3   . GLUCOSAMINE CHONDROITIN COMPLX OR      . Magnesium 400 MG Oral Cap Take 400 mg by mouth.     . methotrexate 2.5 MG tablet TAKE 8 TABLETS BY MOUTH EVERY 7 DAYS 96 tablet 3   . montelukast 10 MG tablet Take 1 tablet (10 mg) by mouth daily. 90 tablet 3   . Multiple Vitamins-Minerals (MULTIVITAMIN OR) no iron     . Probiotic Product (PROBIOTIC DAILY OR)      . SIMETHICONE-80 OR Take 2-3 tablets by mouth daily as needed.     . triamterene-hydroCHLOROthiazide 37.5-25 MG tablet Take 1 tablet by mouth daily. 90 tablet 3   . Unclassified (OTHER MEDS, SEE COMMENTS,) Curamin oral tab daily       No current facility-administered medications for this visit.        PHYSICAL EXAMINATION:  Vital signs:  BP 118/68   Pulse 81   Ht 5' 4.5" (1.638 m)   Wt (!) 126.1 kg (278 lb)   LMP  (LMP Unknown)   BMI 46.98 kg/m   General:  Awake, alert, and oriented, no apparent distress, pleasant, and cooperative  Psychologic:  Mood is euthymic, affect is congruent  EYES: Anicteric sclera, no conjunctival injection  ENT:  Normocephalic, atraumatic, moist membranes, no oral ulcers   Pulmonary:  Non-labored breathing, no wheezes, rhonchi, or rales  Cardiovascular:  RRR, no murmurs, rubs or gallops  Skin:  Normal temperature and texture, No visible rashes, ulcers, or nodules  Neurologic:  Light touch sensation is grossly intact. Face symmetric   Musculoskeletal:     Gait: Normal   Joints: No evidence of synovitis, joint swelling, or tenderness in the hands, wrists, ankles, feet. Mild swelling R>L.   Nails: No pitting, clubbing, or cyanosis noted in the fingernails bilaterally    Muscle strength: Strength is grossly 5 out of 5 throughout the bilateral upper and lower extremities    LABS:    Pending    RADIOLOGY:   None    DISEASE ACTIVITY:  Patient Function: 2.3  Patient Pain  Assessment: 3.5  Patient Global Assessment: 3.5   Combined Rapid 3 Score: 9.3     ASSESSMENT AND PLAN:  Caitlin Wiley is a very pleasant 69 year old female who presents for follow up of rheumatoid arthritis, osteoarthritis.    #.Rheumatoid arthritis.Seropositive, nonerosive RA.Doingmuch better since MTX but does not believe benefit lasts for 1 week.  No evidence of synovitis on exam today.  Her left knee pain sounds mechanical in nature and she does  have evidence of advanced osteoarthritis in both knees.  We discussed therapeutic options including corticosteroid or hyaluronic acid derivative injection.  Patient declines.  She could also try physical therapy although she is continuing some of her exercises at home.  She is a candidate for total knee replacement surgery once she is able to reach her target BMI.  Unfortunately she has gained weight during the pandemic.  Trying an injection may provide some relief where she can further participate in activity including exercise.  She will think about this and let us know.    Intermittent low back pain with sciatica likely due to DDD.  We discussed physical therapy.    -continuemethotrexate 52m once a week  -continuefolic acid 259mdaily  - we discussed COVID-19 precautions despite being vaccinated due to likely decrease efficacy and autoimmune patients on immunosuppressive therapy.  She should continue precautions when around unvaccinated individuals.  We discussed low utility of checking Covid spike antibodies as this will not change clinical management.    #. High risk medication. MTX toxicitylabstoday.    #. Osteoarthritis. Most bothersome in the kneesand is activity related.She is interested in TKR but will need to lose some weight first which I am hopeful now that she is more active. She has tried PT and continues with the exercises.    #. Immunization. She is due for second pneumonia booster and her influenza vaccine.She will get her  high dose flu vaccine today in clinic.    #. Bone health.Last DEXA from 2017 was normal.DEXA 06/2018 normal.    -optimization of calcium and vitamin D  - daily weight bearing exercises      All the patient's questions were answered. The patient will follow up in 4 months.    Dictated using spChief of Staffnd electronically signed by YuRoxy HorsemanMD   Note: This document may contain errors inherent to this technology. If you find any errors that might affect patient care, please contact our office as soon as possible.   [address], [place]pc[place]Ph. [phone]

## 2019-06-26 NOTE — Patient Instructions (Signed)
It was a pleasure seeing you in clinic today.    Continue your current medications  Get labs after your appointment today  Follow up in 4 months      If you have any questions or concerns before your next appointment, please do not hesitate to contact me. Our clinic phone number is [phone] and I am also available via eCare.

## 2019-07-06 ENCOUNTER — Encounter (INDEPENDENT_AMBULATORY_CARE_PROVIDER_SITE_OTHER): Payer: Self-pay | Admitting: Family Medicine

## 2019-07-06 NOTE — Telephone Encounter (Signed)
Defer to pcp

## 2019-07-06 NOTE — Telephone Encounter (Signed)
See care message, pt requesting PA for cataract surgery to [place] [facility] [place].

## 2019-07-09 NOTE — Telephone Encounter (Signed)
Please find out what she needs.  Does she need a referral or a preop evaluation?

## 2019-07-09 NOTE — Telephone Encounter (Signed)
Sent e-care message.

## 2019-08-02 ENCOUNTER — Other Ambulatory Visit (INDEPENDENT_AMBULATORY_CARE_PROVIDER_SITE_OTHER): Payer: Self-pay | Admitting: Rheumatology

## 2019-08-02 DIAGNOSIS — M0579 Rheumatoid arthritis with rheumatoid factor of multiple sites without organ or systems involvement: Secondary | ICD-10-CM

## 2019-08-05 ENCOUNTER — Encounter (INDEPENDENT_AMBULATORY_CARE_PROVIDER_SITE_OTHER): Payer: Self-pay | Admitting: Rheumatology

## 2019-08-08 ENCOUNTER — Other Ambulatory Visit (HOSPITAL_BASED_OUTPATIENT_CLINIC_OR_DEPARTMENT_OTHER): Payer: Self-pay | Admitting: Cardiovascular Disease

## 2019-08-08 DIAGNOSIS — I480 Paroxysmal atrial fibrillation: Secondary | ICD-10-CM

## 2019-08-10 MED ORDER — ELIQUIS 5 MG OR TABS
ORAL_TABLET | ORAL | 1 refills | Status: DC
Start: 2019-08-10 — End: 2019-09-25

## 2019-09-06 ENCOUNTER — Encounter (INDEPENDENT_AMBULATORY_CARE_PROVIDER_SITE_OTHER): Payer: Self-pay

## 2019-09-12 ENCOUNTER — Telehealth (INDEPENDENT_AMBULATORY_CARE_PROVIDER_SITE_OTHER): Payer: Self-pay | Admitting: Family Medicine

## 2019-09-12 DIAGNOSIS — H259 Unspecified age-related cataract: Secondary | ICD-10-CM

## 2019-09-12 NOTE — Telephone Encounter (Signed)
Routing to provider please see message below.     Referral pended

## 2019-09-12 NOTE — Telephone Encounter (Signed)
Order signed.

## 2019-09-12 NOTE — Telephone Encounter (Signed)
Authorization request submitted to patient's insurance.     Authorization is pending

## 2019-09-12 NOTE — Telephone Encounter (Addendum)
RETURN CALL: Voicemail - General Message      SUBJECT:  Referral Request/Questions      REASON FOR REFERRAL: Catarac Consultation  NAME OF CLINIC/SPECIALTY: [facility]  PROVIDER: Dr Linda Hedges   PHONE: [phone]  FAX: [phone]  ADDITIONAL INFORMATION:      PT is Scheduled for 09/18/19    Please fax referral.

## 2019-09-13 NOTE — Telephone Encounter (Signed)
Authorization received from insurance. Referral faxed to:     Your fax has been successfully sent to [facility] at [phone].  ------------------------------------------------------------  From: [facility] Referral Team  ------------------------------------------------------------  7:09:12 AM 09/13/2019   Fax information edited by [facility]    09/13/2019 7:09:28 AM Transmission Record   Sent to [phone] with remote ID "[id number]"   Inbound user ID [email or web address], routing code 100   Result: (0/339;0/0) Success   Page record: 1 - 9   Elapsed time: 06:49 on channel 34      Closing TE

## 2019-09-21 ENCOUNTER — Other Ambulatory Visit (HOSPITAL_BASED_OUTPATIENT_CLINIC_OR_DEPARTMENT_OTHER): Payer: Self-pay | Admitting: Cardiovascular Disease

## 2019-09-21 DIAGNOSIS — I480 Paroxysmal atrial fibrillation: Secondary | ICD-10-CM

## 2019-09-25 MED ORDER — ELIQUIS 5 MG OR TABS
ORAL_TABLET | ORAL | 0 refills | Status: DC
Start: 2019-09-25 — End: 2019-11-22

## 2019-09-25 NOTE — Telephone Encounter (Signed)
Paroxysmal atrial fibrillation (HCC)  (primary encounter diagnosis)  No clinical recurrence of atrial fibrillation, no bothersome palpitations, and no bleeding or TIA symptoms on chronic anticoagulation.  Plan: Continue present therapy

## 2019-10-21 ENCOUNTER — Encounter (INDEPENDENT_AMBULATORY_CARE_PROVIDER_SITE_OTHER): Payer: Self-pay | Admitting: Rheumatology

## 2019-10-21 ENCOUNTER — Other Ambulatory Visit: Payer: Self-pay

## 2019-10-23 ENCOUNTER — Ambulatory Visit (INDEPENDENT_AMBULATORY_CARE_PROVIDER_SITE_OTHER): Payer: Medicare HMO | Admitting: Rheumatology

## 2019-10-23 ENCOUNTER — Ambulatory Visit: Payer: Medicare HMO | Attending: Rheumatology

## 2019-10-23 ENCOUNTER — Ambulatory Visit (HOSPITAL_BASED_OUTPATIENT_CLINIC_OR_DEPARTMENT_OTHER): Payer: Self-pay

## 2019-10-23 ENCOUNTER — Encounter (INDEPENDENT_AMBULATORY_CARE_PROVIDER_SITE_OTHER): Payer: Self-pay | Admitting: Rheumatology

## 2019-10-23 VITALS — BP 126/74 | HR 89 | Wt 278.0 lb

## 2019-10-23 DIAGNOSIS — M0579 Rheumatoid arthritis with rheumatoid factor of multiple sites without organ or systems involvement: Secondary | ICD-10-CM | POA: Insufficient documentation

## 2019-10-23 DIAGNOSIS — Z23 Encounter for immunization: Secondary | ICD-10-CM

## 2019-10-23 DIAGNOSIS — Z79899 Other long term (current) drug therapy: Secondary | ICD-10-CM | POA: Insufficient documentation

## 2019-10-23 DIAGNOSIS — M170 Bilateral primary osteoarthritis of knee: Secondary | ICD-10-CM

## 2019-10-23 LAB — CBC, DIFF
% Basophils: 1 %
% Eosinophils: 2 %
% Immature Granulocytes: 0 %
% Lymphocytes: 21 %
% Monocytes: 6 %
% Neutrophils: 70 %
% Nucleated RBC: 0 %
Absolute Eosinophil Count: 0.22 10*3/uL (ref 0.00–0.50)
Absolute Lymphocyte Count: 1.91 10*3/uL (ref 1.00–4.80)
Basophils: 0.05 10*3/uL (ref 0.00–0.20)
Hematocrit: 46 % — ABNORMAL HIGH (ref 36–45)
Hemoglobin: 14.7 g/dL (ref 11.5–15.5)
Immature Granulocytes: 0.03 10*3/uL (ref 0.00–0.05)
MCH: 29.1 pg (ref 27.3–33.6)
MCHC: 32.3 g/dL (ref 32.2–36.5)
MCV: 90 fL (ref 81–98)
Monocytes: 0.54 10*3/uL (ref 0.00–0.80)
Neutrophils: 6.51 10*3/uL (ref 1.80–7.00)
Nucleated RBC: 0 10*3/uL
Platelet Count: 324 10*3/uL (ref 150–400)
RBC: 5.05 10*6/uL — ABNORMAL HIGH (ref 3.80–5.00)
RDW-CV: 14.6 % — ABNORMAL HIGH (ref 11.6–14.4)
WBC: 9.26 10*3/uL (ref 4.3–10.0)

## 2019-10-23 LAB — COMPREHENSIVE METABOLIC PANEL
ALT (GPT): 12 U/L (ref 7–33)
AST (GOT): 15 U/L (ref 9–38)
Albumin: 4.1 g/dL (ref 3.5–5.2)
Alkaline Phosphatase (Total): 72 U/L (ref 38–172)
Anion Gap: 9 (ref 4–12)
Bilirubin (Total): 0.6 mg/dL (ref 0.2–1.3)
Calcium: 9.4 mg/dL (ref 8.9–10.2)
Carbon Dioxide, Total: 25 meq/L (ref 22–32)
Chloride: 106 meq/L (ref 98–108)
Creatinine: 0.89 mg/dL (ref 0.38–1.02)
Glucose: 102 mg/dL (ref 62–125)
Potassium: 4.3 meq/L (ref 3.6–5.2)
Protein (Total): 6.7 g/dL (ref 6.0–8.2)
Sodium: 140 meq/L (ref 135–145)
Urea Nitrogen: 21 mg/dL (ref 8–21)
eGFR by CKD-EPI: >60 mL/min/{1.73_m2} (ref >59)

## 2019-10-23 LAB — C_REACTIVE PROTEIN: C_Reactive Protein: 9.3 mg/L (ref 0.0–10.0)

## 2019-10-23 LAB — SED RATE: Erythrocyte Sedimentation Rate: 12 mm/h (ref 0–20)

## 2019-10-23 MED ORDER — METHOTREXATE SODIUM 2.5 MG OR TABS
ORAL_TABLET | ORAL | 3 refills | Status: DC
Start: 2019-10-23 — End: 2020-09-25

## 2019-10-23 MED ORDER — FOLIC ACID 1 MG OR TABS
3.0000 mg | ORAL_TABLET | Freq: Every day | ORAL | 3 refills | Status: DC
Start: 2019-10-23 — End: 2020-09-25

## 2019-10-23 NOTE — Addendum Note (Signed)
Addended by: Hewitt Shorts on: 10/23/2019 10:16 AM     Modules accepted: Orders

## 2019-10-23 NOTE — Progress Notes (Signed)
[place]  [address]  TEL: [phone]  l  FAX: [phone]    Follow Up Clinic Note  10/23/2019    IDENTIFYING DATA/CC:    Caitlin Wiley is a very pleasant 69 year old female who presents for follow up of rheumatoid arthritis, osteoarthritis.    RHEUMATOLOGIC HISTORY OF PRESENT ILLNESS:  Diagnosis:RA, OA; initial contult 11/30/2017  Presenting Symptoms/Timeline:2013 joint pain hands, wrists, knees, shoulders  Autoimmune Serologies:RF 140, CCP 16; CRP 17, ESR 23; UA 4.6  Pertinent Radiology:Xrays of hands, wrists, knees, feet: No erosions, consistent with OA, chondrocalcinosis  Medications Used:MXT 11/2017  Significant Flares:  Immunization Status: Influenza, PCV13, PPSV23, Zoster  Infectious Disease Screening: HepatitisB NR,Hepatitis C, TB quantiferon NR  Other: Afib on chronic anticoag,    INTERVAL HISTORY:  Patient returns today for follow-up of rheumatoid arthritis and osteoarthritis.  She was last seen 4 months ago at which time she reported stable disease.  She is on methotrexate for treatment of her inflammatory arthritis.  She also has significant osteoarthritis particularly in the knees.  She is interested in TKA but will need to lose some weight first.    Reports stable health.  She believes methotrexate is working for her.  She has 15 minutes of morning stiffness.  Her main pain is related to osteoarthritis particularly in the knees and also in the neck.  She does occasionally have left CMC joint pain.  No swelling or warmth.  She continues to gain weight.  She is tolerating methotrexate without side effect.  No infectious complications.  She has not received her third Covid vaccine yet.  She does have some dry eyes.    REVIEW OF SYSTEMS:  Complete ROS is negative, except for those mentioned above in the Interval History    PROBLEM LIST:  Patient Active Problem List    Diagnosis Date Noted    Rheumatoid arthritis involving multiple sites  with positive rheumatoid factor [M05.79] 02/08/2018    Primary osteoarthritis of both knees [M17.0] 09/12/2017    Chronic anticoagulation [Z79.01] 08/02/2015    Right cervical radiculopathy [M54.12] 05/30/2015    Morbid obesity with BMI of 40.0-44.9, adult ([place]) [E66.01, Z68.41] 05/30/2015    Paroxysmal atrial fibrillation ([place]) [I48.0] 04/17/2014     A. S/p ablation ([facility] PVI) 10/19/2016      GERD (gastroesophageal reflux disease) [K21.9] 11/08/2012    OA (osteoarthritis) [M19.90] 07/28/2012    Asthma [J45.909] 07/24/2012    Edema [R60.9] 07/24/2012    Sleep apnea [G47.30] 07/24/2012    Pulmonary nodule [R91.1] 07/24/2012    Grief reaction [F43.21] 07/24/2012     05/11/2011         SOCIAL/FAMILY HISTORY:  Reviewed with patient. No changes from what is currently noted in EPIC    ALLERGIES:  Review of patient's allergies indicates:  Allergies   Allergen Reactions    Bee Venom Anaphylaxis    Ciprofloxacin Other     Joint aching    Pneumococcal Vaccines Skin: Hives    Sulfa Antibiotics Skin: Hives    Cats [Animals] Skin: Itching    Dust Mite Extract Skin: Itching     DUST    Pollen Extract Skin: Itching       MEDICATIONS:  Current Outpatient Medications   Medication Sig Dispense Refill    Acetaminophen (TYLENOL) 325 MG Oral Tab Take 325 mg by mouth every 6 hours as needed.      albuterol HFA  108 (90 Base) MCG/ACT inhaler INHALE ONE TO TWO PUFFS BY MOUTH EVERY 6 HOURS AS NEEDED FOR SHORTNESS OF BREATH, WHEEZING 18 g 2    Cholecalciferol (VITAMIN D3 OR) Take by mouth.      Eliquis 5 MG tablet TAKE ONE TABLET BY MOUTH TWICE DAILY 180 tablet 0    famotidine 20 MG tablet Take 1 tablet (20 mg) by mouth 2 times a day. 180 tablet 3    Flaxseed, Linseed, (FLAXSEED OIL) 1200 MG Oral Cap None Entered      folic acid 1 MG tablet Take 3 tablets (3 mg) by mouth daily. 270 tablet 3    GLUCOSAMINE CHONDROITIN COMPLX OR       Magnesium 400 MG Oral Cap Take 400 mg by mouth.      methotrexate 2.5 MG tablet TAKE  8 TABLETS BY MOUTH EVERY 7 DAYS 96 tablet 3    montelukast 10 MG tablet Take 1 tablet (10 mg) by mouth daily. 90 tablet 3    Multiple Vitamins-Minerals (MULTIVITAMIN OR) no iron      Probiotic Product (PROBIOTIC DAILY OR)       SIMETHICONE-80 OR Take 2-3 tablets by mouth daily as needed.      triamterene-hydroCHLOROthiazide 37.5-25 MG tablet Take 1 tablet by mouth daily. 90 tablet 3    Unclassified (OTHER MEDS, SEE COMMENTS,) Curamin oral tab daily       No current facility-administered medications for this visit.        PHYSICAL EXAMINATION:  Vital signs:  BP 126/74    Pulse 89    Wt (!) 126.1 kg (278 lb)    LMP  (LMP Unknown)    BMI 46.98 kg/m   General:  Awake, alert, and oriented, no apparent distress, pleasant, and cooperative  Psychologic:  Mood is euthymic, affect is congruent  EYES: Anicteric sclera, no conjunctival injection  ENT:  Normocephalic, atraumatic, moist membranes, no oral ulcers   Pulmonary:  Non-labored breathing, no wheezes, rhonchi, or rales  Cardiovascular:  RRR, no murmurs, rubs or gallops  Skin:  Normal temperature and texture, No visible rashes, ulcers, or nodules  Neurologic:  Light touch sensation is grossly intact. Face symmetric   Musculoskeletal:     Gait: Normal   Joints: No evidence of synovitis, joint swelling.  Tenderness to palpation of the left CMC joint.  There is mild subluxation as well as crepitus with abduction and adduction of that joint.   Nails: No pitting, clubbing, or cyanosis noted in the fingernails bilaterally    Muscle strength: Strength is grossly 5 out of 5 throughout the bilateral upper and lower extremities    LABS:    Pending    RADIOLOGY:   None    DISEASE ACTIVITY:  Patient Function: 2  Patient Pain Assessment: 3  Patient Global Assessment: 2   Combined Rapid 3 Score: 7    ASSESSMENT AND PLAN:  Caitlin Wiley is a very pleasant 69 year old female who presents for follow up of rheumatoid arthritis, osteoarthritis.    #.Rheumatoid  arthritis.Seropositive, nonerosive RA.Doingmuch better since MTX. No evidence of synovitis on exam today. Knee pain, CMC joint pain sounds mechanical in nature and likely related to osteoarthritis. We discussed therapeutic options including corticosteroid or hyaluronic acid derivative injection. Patient declines. She could also try physical therapy although she is continuing some of her exercises at home. She is a candidate for total knee replacement surgery once she is able to reach her target BMI. Unfortunately she has  gained weight during the pandemic. Trying an injection may provide some relief where she can further participate in activity including exercise. She will think about this and let us know.    -continuemethotrexate 70m once a week  -continuefolic [id number]  - we discussed COVID-19 precautions despite being vaccinated due to likely decrease efficacy and autoimmune patients on immunosuppressive therapy.  She should continue precautions when around unvaccinated individuals.  We discussed low utility of checking Covid spike antibodies as this will not change clinical management.    #. High risk medication. MTX toxicitylabstoday.  We discussed that she is eligible for her third Covid vaccine.  She previously received Moderna.  She should hold methotrexate for 1 week after her vaccine.    #. Osteoarthritis. Most bothersome in the kneesand is activity related.She is interested in TKR but will need to lose some weight first which I am hopeful now that she is more active. She has tried PT and continues with the exercises.    #. Immunization. She is due for influenza vaccine.    #. Bone health.Last DEXA from 2017 was normal.DEXA 06/2018 normal.    -optimization of calcium and vitamin D  - daily weight bearing exercises      All the patient's questions were answered. The patient will follow up in 4 months.    Dictated using sChief of Staffand electronically  signed by YRoxy Horseman MD   Note: This document may contain errors inherent to this technology. If you find any errors that might affect patient care, please contact our office as soon as possible.   [address], [place] Ph. [phone]. [phone]

## 2019-10-23 NOTE — Patient Instructions (Addendum)
It was a pleasure seeing you in clinic today.     Continue your current medications   Hold methotrexate for one week after your 3rd COVID vaccine   Get your flu vaccine   Get labs after your appointment today   Follow up in 4 months    If you have any questions or concerns before your next appointment, please do not hesitate to contact me. Our clinic phone number is [phone] and I am also available via eCare.

## 2019-11-01 ENCOUNTER — Other Ambulatory Visit (INDEPENDENT_AMBULATORY_CARE_PROVIDER_SITE_OTHER): Payer: Self-pay | Admitting: Family Medicine

## 2019-11-01 DIAGNOSIS — K219 Gastro-esophageal reflux disease without esophagitis: Secondary | ICD-10-CM

## 2019-11-02 MED ORDER — FAMOTIDINE 20 MG OR TABS
ORAL_TABLET | ORAL | 0 refills | Status: DC
Start: 2019-11-02 — End: 2019-11-22

## 2019-11-02 NOTE — Telephone Encounter (Signed)
Last wellness 10/13/18    Patient is due for yearly exam.  One refill authorized.  Please schedule follow up visit.

## 2019-11-03 NOTE — Telephone Encounter (Signed)
Mychart message sent.

## 2019-11-06 ENCOUNTER — Other Ambulatory Visit (INDEPENDENT_AMBULATORY_CARE_PROVIDER_SITE_OTHER): Payer: Self-pay | Admitting: Family Medicine

## 2019-11-06 DIAGNOSIS — J4520 Mild intermittent asthma, uncomplicated: Secondary | ICD-10-CM

## 2019-11-06 DIAGNOSIS — R600 Localized edema: Secondary | ICD-10-CM

## 2019-11-07 ENCOUNTER — Other Ambulatory Visit: Payer: Self-pay

## 2019-11-07 MED ORDER — TRIAMTERENE-HCTZ 37.5-25 MG OR TABS
1.0000 | ORAL_TABLET | Freq: Every day | ORAL | 0 refills | Status: DC
Start: 2019-11-07 — End: 2019-11-22

## 2019-11-07 MED ORDER — MONTELUKAST SODIUM 10 MG OR TABS
10.0000 mg | ORAL_TABLET | Freq: Every day | ORAL | 0 refills | Status: DC
Start: 2019-11-07 — End: 2019-11-22

## 2019-11-21 ENCOUNTER — Other Ambulatory Visit: Payer: Self-pay

## 2019-11-22 ENCOUNTER — Ambulatory Visit (INDEPENDENT_AMBULATORY_CARE_PROVIDER_SITE_OTHER): Payer: Medicare HMO | Admitting: Family Medicine

## 2019-11-22 ENCOUNTER — Encounter (INDEPENDENT_AMBULATORY_CARE_PROVIDER_SITE_OTHER): Payer: Self-pay | Admitting: Family Medicine

## 2019-11-22 VITALS — BP 108/71 | HR 100 | Temp 98.6°F | Resp 16 | Ht 64.76 in | Wt 277.0 lb

## 2019-11-22 DIAGNOSIS — K219 Gastro-esophageal reflux disease without esophagitis: Secondary | ICD-10-CM

## 2019-11-22 DIAGNOSIS — I480 Paroxysmal atrial fibrillation: Secondary | ICD-10-CM

## 2019-11-22 DIAGNOSIS — J4520 Mild intermittent asthma, uncomplicated: Secondary | ICD-10-CM

## 2019-11-22 DIAGNOSIS — Z23 Encounter for immunization: Secondary | ICD-10-CM

## 2019-11-22 DIAGNOSIS — Z760 Encounter for issue of repeat prescription: Secondary | ICD-10-CM

## 2019-11-22 DIAGNOSIS — R600 Localized edema: Secondary | ICD-10-CM

## 2019-11-22 DIAGNOSIS — Z0000 Encounter for general adult medical examination without abnormal findings: Secondary | ICD-10-CM

## 2019-11-22 MED ORDER — MONTELUKAST SODIUM 10 MG OR TABS
10.0000 mg | ORAL_TABLET | Freq: Every day | ORAL | 3 refills | Status: DC
Start: 2019-11-22 — End: 2020-11-21

## 2019-11-22 MED ORDER — INFLUENZA VAC A&B SA ADJ QUAD 0.5 ML IM PRSY
0.5000 mL | PREFILLED_SYRINGE | Freq: Once | INTRAMUSCULAR | Status: AC
Start: 2019-11-22 — End: 2019-11-22
  Administered 2019-11-22: 0.5 mL via INTRAMUSCULAR

## 2019-11-22 MED ORDER — FAMOTIDINE 20 MG OR TABS
20.0000 mg | ORAL_TABLET | Freq: Two times a day (BID) | ORAL | 3 refills | Status: DC
Start: 2019-11-22 — End: 2020-11-21

## 2019-11-22 MED ORDER — TRIAMTERENE-HCTZ 37.5-25 MG OR TABS
1.0000 | ORAL_TABLET | Freq: Every day | ORAL | 3 refills | Status: DC
Start: 2019-11-22 — End: 2020-11-21

## 2019-11-22 MED ORDER — APIXABAN 5 MG OR TABS
5.0000 mg | ORAL_TABLET | Freq: Two times a day (BID) | ORAL | 3 refills | Status: DC
Start: 2019-11-22 — End: 2020-11-21

## 2019-11-22 NOTE — Progress Notes (Signed)
ANNUAL WELLNESS VISIT    Caitlin Wiley is a 69 year old female who presents for an Annual Wellness Visit.   []  Initial   [x]  Subsequent        INFORMATION GATHERING:   The following areas were confirmed with patient/caregiver and/or updated in Epic at this visit:       Past Medical History   Past Surgical History   Family History   Social History    Current medications and supplements (including vitamins and calcium)   Allergies  [x]  All of the above components have been reviewed and updated.       The information below is up to date at the end of this visit:   Review of functional ability, hearing, fall risk and safety, diet, physical activity, and health habits  (via HRA or direct review with patient or caregiver)     The Health Risk Assessment (HRA) for today's visit was completed:    [x]  as Patient-Entered Questionnaire via eCare     []  as paper HRA form, completed by or with the patient or caregiver      []  in HRA template documentation, completed by staff or provider with patient or caregiver at visit        List of current providers and suppliers:     []   See Care Team Section   [x]   See EHR Encounters for [place] Providers involved in care   []   Other providers and suppliers outside [facility]:     Depression screening:    PHQ-2: 0     EXAM:  BP 108/71   Pulse 100   Temp 37 C (Temporal)   Resp 16   Ht 5' 4.76" (1.645 m)   Wt (!) 125.6 kg (277 lb)   LMP  (LMP Unknown)   SpO2 96%   BMI 46.43 kg/m         General:  alert, cooperative, no acute distress  Lung:  clear to auscultation bilaterally  Heart:  regular rate and rythmn, no murmurs   GAIT:    []  Normal, stable, independent    [x]  Abnormal (describe):    As assessed by:     [x]  Direct Observation; using walker     []  Timed Up and Go     []  Other:  COGNITION:    [x]  Intact     []  Abnormal (describe):     As assessed by:      [x]  Direct Observation     []  Brief Cognitive Screen          ADVANCE CARE PLANNING (ACP)     Advance  care planning:   []  Patient Accepted   [x]  Patient Declined     Check all that apply:   []  Advance directives are on file in her chart   []  Explained & discussed advance directives at this visit   []  Completed advance care planning form(s) at this visit   []  Other Advance Care Planning discussion at this visit (describe):       Time Spent on ACP: []  None       []  1-15 minutes      []  16-30 minutes      []  >30 minutes      ASSESSMENT:       Caitlin Wiley was seen for her Annual Wellness Visit, including identification of risk factors & conditions that may affect her health and function in the future.     (Z00.00)  Encounter for Medicare annual wellness exam  (primary encounter diagnosis)  Plan:     (I48.0) Paroxysmal atrial fibrillation ([place])  Plan: apixaban (Eliquis) 5 MG tablet            (R60.0) Edema of both legs  Plan: triamterene-hydroCHLOROthiazide 37.5-25 MG         tablet            (J45.20) Mild intermittent asthma without complication  Plan: montelukast 10 MG tablet            (K21.9) Gastroesophageal reflux disease without esophagitis  Plan: famotidine 20 MG tablet        Having some increased symptoms.  Discussed dietary triggers.    (Z23) Need for vaccination  Plan: influenza quadrivalent adjuvanted vaccine         (Fluad) injection 0.5 mL                       Actions at this visit:         Establishing or updating a written schedule of screening and prevention measures recommended and appropriate for Caitlin Wiley for the next 5-10 years   Establishing or updating a list of her risk factors and conditions for which lifestyle or medical interventions are recommended or underway, including mental health risks and conditions, and including risks/benefits of treatment   Furnishing personalized health advice and, as appropriate, referrals to health education or preventive counseling services or programs (such as fall prevention, tobacco cessation, physical activity, nutrition, weight  loss)    [x]  All of the above components have been reviewed and updated.     COUNSELING:      It is very important to maintain a healthy lifestyle.  This begins with eating a balanced diet that includes daily servings of fruit, vegetables and whole grains.  It is much healthier to prepare your own meals.  Try to avoid fast food and sugared drinks.  Maintaining an active lifestyle is also important.  This includes 3-4 days of an exercise that increases your heart rate (cardio) for at least 20-30 minutes.  Examples of cardiovascular exercise are walking, jogging, swimming, biking.  Stretching exercises such as yoga and working with weights can also have great benefit.     Specific health issues that are important for you to manage are:    1. Atrial fibrillation  2. Asthma       Here are screening & prevention measures recommended for you:  Health Maintenance   Topic Date Due   . Zoster Vaccine (1 of 2) Never done   . Medicare Annual Wellness Visit  10/13/2019   . Influenza Vaccine (1) 10/19/2019   . DTaP, Tdap, and Td Vaccines (3 - Td) 03/13/2020   . Osteoporosis Screening  06/19/2020   . Depression Screening (PHQ-2)  11/20/2020   . Colorectal Cancer Screening  04/02/2021   . Breast Cancer Screening  09/05/2021   . Lipid Disorders Screening  08/24/2022   . Diabetes Screening  10/23/2022   . Pneumococcal Vaccine: 65+ years  Completed   . Hepatitis C Screening  Completed   . COVID-19 Vaccine  Completed   . Hepatitis A Vaccine  Aged Out   . Hepatitis B Vaccine  Aged Out       Please plan to have a Subsequent Annual Wellness Visit in 1 year.

## 2019-11-22 NOTE — Progress Notes (Signed)
Visit: pt is her for a wellness and medication refills. Pt is here to address not her normal acid reflux sx last Thursday and Fri increased gas and left side pain. Pt states on Tuesday has the same pain on right side. Pt would like to a referral to ortho for both knees pain/replacement surgery.     Refills? YES  Referral? NO  Letter or Form? NO  Lab Results? NO    HEALTH MAINTENANCE:  Has the patient has this done since their last visit?  Cervical screening/PAP: N/A  Mammo: N/A  Colon Screen: N/A  Diabetic Eye Exam (If applicable): N/A    Have you seen a specialist since your last visit: YES     Vaccines Due? Yes, flu     Does patient have eCare?  YES     HM Due:   Health Maintenance   Topic Date Due   . Zoster Vaccine (1 of 2) Never done   . Medicare Annual Wellness Visit  10/13/2019   . Influenza Vaccine (1) 10/19/2019   . DTaP, Tdap, and Td Vaccines (3 - Td) 03/13/2020   . Osteoporosis Screening  06/19/2020   . Depression Screening (PHQ-2)  11/20/2020   . Colorectal Cancer Screening  04/02/2021   . Breast Cancer Screening  09/05/2021   . Lipid Disorders Screening  08/24/2022   . Diabetes Screening  10/23/2022   . Pneumococcal Vaccine: 65+ years  Completed   . Hepatitis C Screening  Completed   . COVID-19 Vaccine  Completed   . Hepatitis A Vaccine  Aged Out   . Hepatitis B Vaccine  Aged Out       PCP Verified?  Yes, El-Attar, Hanley Seamen, MD      Vaccine Screening Questions    Interpreter: No    1. Are you allergic to Latex? NO    2.  Have you had a serious reaction or an allergic reaction to a vaccine?  NO    3.  Currently have a moderate or severe illness, including fever?  NO    4.  Ever had a seizure or any neurological problem associated with a vaccine? (DTaP/TDaP/DTP pertinent) NO    5.  Is patient receiving any live vaccinations today? (Varicella-Chickenpox, MMR-Measles/Mumps/Rubella, Zoster-Shingles, Flumist, Yellow Fever) NOTE: oral rotavirus is exempt  NO    If YES to any of the questions above - Do NOT  give vaccine.  Consult with RN or provider in clinic.  (#5 can be YES if all Live vaccine questions are answered NO)    If NO to all questions above - Patient may receive vaccine.    6. Do you need to receive the Flu vaccine today? YES - Additional Flu Questions  Flu Vaccine Screening Questions:    Ever had a serious allergic reaction to eggs?  NO    Ever had Guillain-Barre syndrome associated with a vaccine? NO    Less than 6 months old? NO    If YES to any of the Flu questions above - NO Flu Vaccine to be given.  Patient may consult provider as needed.    If NO to all questions above - Patient may receive Flu Shot (IM)    Is the patient requesting Flumist? NO    If between 6 months and 36 years of age, was flu vaccine received last year?  N/A  If NO to above question:  . Children who are receiving influenza vaccine for the first time - administer 2 doses of  the current influenza vaccine (separated by at least 4 weeks).          All patients are encouraged to wait 15 minutes before leaving after receiving any vaccine.    VIS given 11/22/2019 by Casper Harrison, CMA.    Vaccine given today without initial adverse effect. YES    Serina Cowper, CMA                 Answers for HPI/ROS submitted by the patient on 11/21/2019   [facility] Score: 0

## 2019-11-22 NOTE — Patient Instructions (Addendum)
You may be a candidate for the following screening and prevention measures:    Health Maintenance   Topic Date Due   . Zoster Vaccine (1 of 2) Never done   . Medicare Annual Wellness Visit  10/13/2019   . Influenza Vaccine (1) 10/19/2019   . DTaP, Tdap, and Td Vaccines (3 - Td) 03/13/2020   . Osteoporosis Screening  06/19/2020   . Depression Screening (PHQ-2)  11/20/2020   . Colorectal Cancer Screening  04/02/2021   . Breast Cancer Screening  09/05/2021   . Lipid Disorders Screening  08/24/2022   . Diabetes Screening  10/23/2022   . Pneumococcal Vaccine: 65+ years  Completed   . Hepatitis C Screening  Completed   . COVID-19 Vaccine  Completed   . Hepatitis A Vaccine  Aged Out   . Hepatitis B Vaccine  Aged Out       It is very important to maintain a healthy lifestyle.  This begins with eating a balanced diet that includes daily servings of fruit, vegetables and whole grains.  It is much healthier to prepare your own meals.  Try to avoid fast food and sugared drinks.  Maintaining an active lifestyle is also important.  This includes 3-4 days of an exercise that increases your heart rate (cardio) for at least 20-30 minutes.  Examples of cardiovascular exercise are walking, jogging, swimming, biking.  Stretching exercises such as yoga and working with weights can also have great benefit.     Specific health issues that are important for you to manage are:    1. Atrial fibrillation  2. Asthma

## 2019-12-10 ENCOUNTER — Ambulatory Visit (INDEPENDENT_AMBULATORY_CARE_PROVIDER_SITE_OTHER): Payer: Self-pay | Admitting: Family Medicine

## 2019-12-10 ENCOUNTER — Telehealth (INDEPENDENT_AMBULATORY_CARE_PROVIDER_SITE_OTHER): Payer: Self-pay | Admitting: Family Medicine

## 2019-12-10 ENCOUNTER — Encounter (INDEPENDENT_AMBULATORY_CARE_PROVIDER_SITE_OTHER): Payer: Self-pay | Admitting: Family Medicine

## 2019-12-10 NOTE — Telephone Encounter (Signed)
"On Friday I noticed a few drops of bright red blood when I urinated" "the blood was coming from the vaginal area""I have had a little bit of spotting and now the discharge is brown in color"     Reason for Disposition   Taking Coumadin (warfarin) or other strong blood thinner, or known bleeding disorder (e.g., thrombocytopenia)    Additional Information   Negative: Shock suspected (e.g., cold/pale/clammy skin, too weak to stand, low BP, rapid pulse)   Negative: Difficult to awaken or acting confused (e.g., disoriented, slurred speech)   Negative: Passed out (i.e., lost consciousness, collapsed and was not responding)   Negative: Sounds like a life-threatening emergency to the triager   Negative: Followed a genital area injury   Negative: [1] Vaginal discharge is main symptom AND [2] small amount of blood   Negative: SEVERE abdominal pain   Negative: SEVERE dizziness (e.g., unable to stand, requires support to walk, feels like passing out now)   Negative: Sexual assault or rape   Negative: SEVERE vaginal bleeding (e.g., soaking 2 pads or tampons per hour and present 2 or more hours)   Negative: Patient sounds very sick or weak to the triager   Negative: MODERATE vaginal bleeding (i.e., soaking 1 pad or tampon per hour and present > 6 hours)   Negative: Pale skin (pallor) of new onset or worsening   Negative: [1] Constant abdominal pain AND [2] present > 2 hours    Answer Assessment - Initial Assessment Questions  1. AMOUNT: "Describe the bleeding that you are having." "How much bleeding is there?"     - SPOTTING: spotting, or pinkish / brownish mucous discharge; does not fill panti-liner or pad     - MILD:  less than 1 pad / hour; less than patient's  menstrual bleeding when she still had menstrual periods    - MODERATE: 1-2 pads / hour; small-medium blood clots (e.g., pea, grape, small coin)     - SEVERE: soaking 2 or more pads/hour for 2 or more hours; bleeding not contained by pads or continuous  red blood from vagina; large blood clots (e.g., golf ball, large coin)       spotting  2. ONSET: "When did the bleeding begin?" "Is it continuing now?"      Last Friday   3. MENOPAUSE: "When was your last menstrual period?"       No   4. ABDOMINAL PAIN: "Do you have any pain?" "How bad is the pain?"  (e.g., Scale 1-10; mild, moderate, or severe)    - MILD (1-3): doesn't interfere with normal activities, abdomen soft and not tender to touch     - MODERATE (4-7): interferes with normal activities or awakens from sleep, tender to touch     - SEVERE (8-10): excruciating pain, doubled over, unable to do any normal activities       No   5. BLOOD THINNERS: "Do you take any blood thinners?" (e.g., Coumadin/warfarin, Pradaxa/dabigatran, aspirin)      Eliqfuis   6. HORMONES: "Are you taking any hormone medications, prescription or OTC?" (e.g., birth control pills, estrogen)      No   7. CAUSE: "What do you think is causing the bleeding?" (e.g., recent gyn surgery, recent gyn procedure; known bleeding disorder, uterine cancer)        No   8. HEMODYNAMIC STATUS: "Are you weak or feeling lightheaded?" If so, ask: "Can you stand and walk normally?"        No  9. OTHER SYMPTOMS: "What other symptoms are you having with the bleeding?" (e.g., back pain, burning with urination, fever)      no    Protocols used: VAGINAL BLEEDING - POSTMENOPAUSAL-ADULT - AH

## 2019-12-10 NOTE — Telephone Encounter (Signed)
Scheduled

## 2019-12-10 NOTE — Telephone Encounter (Signed)
Routing to nurse triage.

## 2019-12-10 NOTE — Telephone Encounter (Signed)
Please schedule patient with Dr Arvil Chaco tomorrow; she has several same day openings, and PCP is away this week  30 min appointment  Thanks

## 2019-12-10 NOTE — Telephone Encounter (Signed)
Copied from My Chart message dated 11/22 at 11:23 AM  Friday I noticed a few drops of bright red blood on the TP when I urinated (from the vaginal area).  The next day, Saturday, there was some discharge mucus/mixed with blood (very little). And Sunday and today there were traces of blood on the TP but not the bright red liquid blood of Friday.    I have had break-thru bleeding in the past and saw Dr. Debbrah Alar (sp) in July 2019 (if I am remembering correctly) and she did an exam and biopsy and determined it was a non-cancerous polyp.    Not sure what to do this time.   Please advise

## 2019-12-10 NOTE — Telephone Encounter (Signed)
"On Friday I noticed a few drops of bright red blood when I urinated" "the blood was coming from the vaginal area""I have had a little bit of spotting and now the discharge is brown in color"     Declined disposition at this time to schedule an appointment to be seen within 24 hours   Requesting instructions from PCP  "I saw Dr. Norma Fredrickson in 2019 when she did an exam and biopsy and discovered a polyp"    Reason for Disposition   Taking Coumadin (warfarin) or other strong blood thinner, or known bleeding disorder (e.g., thrombocytopenia)    Additional Information   Negative: Shock suspected (e.g., cold/pale/clammy skin, too weak to stand, low BP, rapid pulse)   Negative: Difficult to awaken or acting confused (e.g., disoriented, slurred speech)   Negative: Passed out (i.e., lost consciousness, collapsed and was not responding)   Negative: Sounds like a life-threatening emergency to the triager   Negative: Followed a genital area injury   Negative: [1] Vaginal discharge is main symptom AND [2] small amount of blood   Negative: SEVERE abdominal pain   Negative: SEVERE dizziness (e.g., unable to stand, requires support to walk, feels like passing out now)   Negative: Sexual assault or rape   Negative: SEVERE vaginal bleeding (e.g., soaking 2 pads or tampons per hour and present 2 or more hours)   Negative: Patient sounds very sick or weak to the triager   Negative: MODERATE vaginal bleeding (i.e., soaking 1 pad or tampon per hour and present > 6 hours)   Negative: Pale skin (pallor) of new onset or worsening   Negative: [1] Constant abdominal pain AND [2] present > 2 hours    Answer Assessment - Initial Assessment Questions  1. AMOUNT: "Describe the bleeding that you are having." "How much bleeding is there?"     - SPOTTING: spotting, or pinkish / brownish mucous discharge; does not fill panti-liner or pad     - MILD:  less than 1 pad / hour; less than patient's  menstrual bleeding when she still had  menstrual periods    - MODERATE: 1-2 pads / hour; small-medium blood clots (e.g., pea, grape, small coin)     - SEVERE: soaking 2 or more pads/hour for 2 or more hours; bleeding not contained by pads or continuous red blood from vagina; large blood clots (e.g., golf ball, large coin)       spotting  2. ONSET: "When did the bleeding begin?" "Is it continuing now?"      Last Friday   3. MENOPAUSE: "When was your last menstrual period?"       No   4. ABDOMINAL PAIN: "Do you have any pain?" "How bad is the pain?"  (e.g., Scale 1-10; mild, moderate, or severe)    - MILD (1-3): doesn't interfere with normal activities, abdomen soft and not tender to touch     - MODERATE (4-7): interferes with normal activities or awakens from sleep, tender to touch     - SEVERE (8-10): excruciating pain, doubled over, unable to do any normal activities       No   5. BLOOD THINNERS: "Do you take any blood thinners?" (e.g., Coumadin/warfarin, Pradaxa/dabigatran, aspirin)      Eliqfuis   6. HORMONES: "Are you taking any hormone medications, prescription or OTC?" (e.g., birth control pills, estrogen)      No   7. CAUSE: "What do you think is causing the bleeding?" (e.g., recent gyn surgery, recent gyn procedure;  known bleeding disorder, uterine cancer)        No   8. HEMODYNAMIC STATUS: "Are you weak or feeling lightheaded?" If so, ask: "Can you stand and walk normally?"        No   9. OTHER SYMPTOMS: "What other symptoms are you having with the bleeding?" (e.g., back pain, burning with urination, fever)      no    Protocols used: VAGINAL BLEEDING - POSTMENOPAUSAL-ADULT - AH

## 2019-12-10 NOTE — Telephone Encounter (Signed)
Telephone triage encounter created to assess current symptoms

## 2019-12-11 ENCOUNTER — Ambulatory Visit (INDEPENDENT_AMBULATORY_CARE_PROVIDER_SITE_OTHER): Payer: Medicare HMO | Admitting: Family Medicine

## 2019-12-11 VITALS — BP 116/74 | HR 86 | Temp 98.6°F

## 2019-12-11 DIAGNOSIS — L900 Lichen sclerosus et atrophicus: Secondary | ICD-10-CM

## 2019-12-11 DIAGNOSIS — N950 Postmenopausal bleeding: Secondary | ICD-10-CM

## 2019-12-11 DIAGNOSIS — R300 Dysuria: Secondary | ICD-10-CM

## 2019-12-11 LAB — U/A AUTO DIPSTICK ONLY, ONSITE
Bilirubin, Urine: NEGATIVE
Glucose, Urine: NEGATIVE mg/dL
Ketones, URN: NEGATIVE mg/dL
Leukocytes: NEGATIVE
Nitrite, URN: NEGATIVE
Occult Blood, URN: NEGATIVE
Protein: NEGATIVE mg/dL
Specific Gravity, Urine: 1.02 (ref 1.005–1.030)
Urobilinogen, URN: 0.2 E.U./dL (ref 0.2–1.0)
pH, URN: 7 (ref 5.0–8.0)

## 2019-12-11 MED ORDER — CLOBETASOL PROPIONATE 0.05 % EX OINT
TOPICAL_OINTMENT | Freq: Two times a day (BID) | CUTANEOUS | 1 refills | Status: DC
Start: 2019-12-11 — End: 2020-04-30

## 2019-12-11 NOTE — Progress Notes (Signed)
Visit: Noticed blood after whipping vagina 5 days ago       Refills? NO  Referral? NO  Letter or Form? NO  Lab Results? NO    HEALTH MAINTENANCE:  Has the patient has this done since their last visit?  Cervical screening/PAP: N/A  Mammo: UTD   Colon Screen: UTD  Diabetic Eye Exam (If applicable): N/A    Have you seen a specialist since your last visit: No    Vaccines Due? Yes, Zoster    Does patient have eCare?  YES    HM Due:   Health Maintenance   Topic Date Due    Zoster Vaccine (1 of 2) Never done    DTaP, Tdap, and Td Vaccines (3 - Td or Tdap) 03/13/2020    Osteoporosis Screening  06/19/2020    Depression Screening (PHQ-2)  11/20/2020    Medicare Annual Wellness Visit  11/21/2020    Colorectal Cancer Screening  04/02/2021    Breast Cancer Screening  09/05/2021    Lipid Disorders Screening  08/24/2022    Diabetes Screening  10/23/2022    Pneumococcal Vaccine: 65+ years  Completed    Influenza Vaccine  Completed    Hepatitis C Screening  Completed    COVID-19 Vaccine  Completed    Hepatitis A Vaccine  Aged Out    Hepatitis B Vaccine  Aged Out       PCP Verified?  Yes, El-Attar, Hanley Seamen, MD

## 2019-12-11 NOTE — Progress Notes (Signed)
Chief Complaint   Patient presents with    Urine Problem     Noticed blood after whipping vagina 5 days ago         Subjective:     Caitlin Wiley is a 69 year old female who presents on 12/11/2019.    After urinating noted BRB on TP last week.  She has had a few occurrences since then.  She is also noticed that when she is trying to have a bowel movement she has some mucousy vaginal discharge.  She is quite certain the blood is coming from her vagina.  She does also get irritation and superficial bleeding from her perineum occasionally.  The discomfort started about 6 months ago.    8/19 seen by gyn for bleeding.  Exam was very difficult, on [place] endometrial stripe was 7 mm and there was a 6 mm endocervical polyp.  Endometrial biopsy did not have any endometrium.  She was advised to have hysteroscopy but she opted not to       Objective:    Vitals: BP 116/74    Pulse 86    Temp 37 C (Temporal)    LMP  (LMP Unknown)    SpO2 96%   Physical Exam  Genitourinary:          Pleasant woman, sitting comfortably  White, sclerotic tissue on the perineum extending onto the right labia       Assessment and Plan:      [name] was seen today for urine problem.    Dysuria  Normal u/a  -     POC Urine Dipstick, Automated; Future  -     POC Urine Dipstick, Automated    Lichen sclerosus et atrophicus  Lichen sclerosis on her perineum and labia.  Will treat with clobetasol as needed for comfort.  -     clobetasol 0.05 % ointment; Apply topically 2 times a day. Apply to perineum as needed.  Dispense: 45 g; Refill: 1    Postmenopausal bleeding  Patient with elevated BMI with recurrent vaginal bleeding with thickened endometrial stripe and endocervical polyp on u/s in 2019.  Prior attempt at endometrial biopsy was unsuccessful and she was advised to have hysteroscopy.  Patient would like to stay closer to home so referral was done to our gynecologist for evaluation and work-up.  -     Referral to Gynecology; Future    Other  orders  -     ZEBRA LABELS

## 2020-01-01 ENCOUNTER — Ambulatory Visit: Payer: Medicare HMO | Attending: Cardiovascular Disease | Admitting: Cardiovascular Disease

## 2020-01-01 VITALS — BP 118/74 | HR 87 | Temp 97.1°F | Ht 66.0 in | Wt 286.0 lb

## 2020-01-01 DIAGNOSIS — I480 Paroxysmal atrial fibrillation: Secondary | ICD-10-CM | POA: Insufficient documentation

## 2020-01-01 DIAGNOSIS — I10 Essential (primary) hypertension: Secondary | ICD-10-CM | POA: Insufficient documentation

## 2020-01-01 NOTE — Patient Instructions (Addendum)
Your heart exam is normal.   WE agreed to monitor the elevated heart rates for now, let me know if symptoms progress and you want to try a low dose of calcium channel blocker (Cartia/diltiazem).   We discussed weight loss by reducing carbs in your diet and trying to increase your level of activity in any way you can.   Please continue the Eliquis, hopefully the Gyn evaluation will be ok.

## 2020-01-02 NOTE — Progress Notes (Signed)
Caitlin Wiley returns for management of paroxysmal atrial fibrillation and hypertension in the setting of sleep apnea and rheumatoid arthritis status post atrial fibrillation ablation October 2018.  An event monitor in fall 2019 showed short episodes of atrial fibrillation only.  She remains on long-term anticoagulation.    She denies new medical problems or hospitalizations in the past year.  She uses CPAP nightly.  The past year she reports gaining 20 pounds.  Level of activity is relatively low, she walks about 2000 steps per day.  Walking is limited by inflamed, "unstable" knees.  She does some stretching and light weights.  She reports blood pressures well controlled at 125 over 70s with heart rates in the 70s.  She has an occasional increase in heart rate over the past 3 weeks.  It usually calms down over about 10 minutes, and she thinks the episodes are associated with dehydration.  Rates are in the 90s and no faster.  Episodes are not like her prior atrial fibrillation as the rhythm is not irregular.  She denies syncope or TIA symptoms.  She has noted a small amount of vaginal bleeding with a GYN appointment pending.    Review of patient's allergies indicates:  Allergies   Allergen Reactions    Bee Venom Anaphylaxis    Ciprofloxacin Other     Joint aching    Pneumococcal Vaccines Skin: Hives    Sulfa Antibiotics Skin: Hives    Cats [Animals] Skin: Itching    Dust Mite Extract Skin: Itching     DUST    Pollen Extract Skin: Itching     Current Outpatient Medications   Medication Sig Dispense Refill    Acetaminophen (TYLENOL) 325 MG Oral Tab Take 325 mg by mouth every 6 hours as needed.      albuterol HFA 108 (90 Base) MCG/ACT inhaler INHALE ONE TO TWO PUFFS BY MOUTH EVERY 6 HOURS AS NEEDED FOR SHORTNESS OF BREATH, WHEEZING 18 g 2    apixaban (Eliquis) 5 MG tablet Take 1 tablet (5 mg) by mouth 2 times a day. 180 tablet 3    Cholecalciferol (VITAMIN D3 OR) Take by mouth.      clobetasol 0.05 % ointment  Apply topically 2 times a day. Apply to perineum as needed. 45 g 1    famotidine 20 MG tablet Take 1 tablet (20 mg) by mouth 2 times a day. 180 tablet 3    Flaxseed, Linseed, (FLAXSEED OIL) 1200 MG Oral Cap None Entered      folic acid 1 MG tablet Take 3 tablets (3 mg) by mouth daily. 270 tablet 3    GLUCOSAMINE CHONDROITIN COMPLX OR       Magnesium 400 MG Oral Cap Take 400 mg by mouth.      methotrexate 2.5 MG tablet TAKE 8 TABLETS BY MOUTH EVERY 7 DAYS 96 tablet 3    montelukast 10 MG tablet Take 1 tablet (10 mg) by mouth daily. 90 tablet 3    Multiple Vitamins-Minerals (MULTIVITAMIN OR) no iron      Probiotic Product (PROBIOTIC DAILY OR)       SIMETHICONE-80 OR Take 2-3 tablets by mouth daily as needed.      triamterene-hydroCHLOROthiazide 37.5-25 MG tablet Take 1 tablet by mouth daily. 90 tablet 3    Unclassified (OTHER MEDS, SEE COMMENTS,) Curamin oral tab daily       No current facility-administered medications for this visit.     Patient Active Problem List   Diagnosis  Asthma    Edema    Sleep apnea    Pulmonary nodule    Grief reaction    OA (osteoarthritis)    GERD (gastroesophageal reflux disease)    Paroxysmal atrial fibrillation (HCC)    Right cervical radiculopathy    Morbid obesity with BMI of 40.0-44.9, adult (HCC)    Chronic anticoagulation    Primary osteoarthritis of both knees    Rheumatoid arthritis involving multiple sites with positive rheumatoid factor     BP 118/74    Pulse 87    Temp 36.2 C (Tympanic)    Ht _0  (1.676 m)    Wt (!) 129.7 kg (286 lb)    LMP  (LMP Unknown)    SpO2 95%    BMI 46.16 kg/m   She is alert and comfortable.  No carotid bruit.  JVP 5 cm of water.  Lungs are clear.  Rhythm regular with no murmur or gallop.  She has mild nonpitting ankle edema.    Laboratories: From October 5 comprehensive metabolic panel and ESR normal, CRP mildly elevated at 9.    A/P:  (I48.0) Paroxysmal atrial fibrillation (HCC)  (primary encounter diagnosis)  No  definite recurrence of paroxysmal atrial fibrillation, she reports occasional elevated heart rates but they are not rapid.  She has had vaginal bleeding, small amounts, on anticoagulation with Eliquis, and she has an upcoming GYN evaluation.  We agreed to observation for the atrial fibrillation, palpitations for now.  She will report any progression of her symptoms.  We discussed using a low-dose of diltiazem, but she prefers to avoid medical management at this time.  Plan: As above    (I10) Essential hypertension  Blood pressures controlled.  I still encouraged her to lose weight by reducing carbohydrates in her diet and trying to increase her level of activity.  Plan: Continue current therapy, lifestyle changes.    I spent a total of 26 minutes for the patient's care on the date of the service.

## 2020-01-08 ENCOUNTER — Ambulatory Visit (INDEPENDENT_AMBULATORY_CARE_PROVIDER_SITE_OTHER): Payer: Medicare HMO | Admitting: Obstetrics & Gynecology

## 2020-01-08 ENCOUNTER — Encounter (INDEPENDENT_AMBULATORY_CARE_PROVIDER_SITE_OTHER): Payer: Self-pay | Admitting: Obstetrics & Gynecology

## 2020-01-08 ENCOUNTER — Encounter (INDEPENDENT_AMBULATORY_CARE_PROVIDER_SITE_OTHER): Payer: Self-pay | Admitting: Rheumatology

## 2020-01-08 VITALS — BP 130/84 | HR 78 | Temp 98.8°F | Ht 66.0 in | Wt 286.0 lb

## 2020-01-08 DIAGNOSIS — N9089 Other specified noninflammatory disorders of vulva and perineum: Secondary | ICD-10-CM

## 2020-01-08 DIAGNOSIS — N841 Polyp of cervix uteri: Secondary | ICD-10-CM

## 2020-01-08 DIAGNOSIS — N950 Postmenopausal bleeding: Secondary | ICD-10-CM

## 2020-01-08 MED ORDER — MISOPROSTOL 200 MCG OR TABS
400.0000 ug | ORAL_TABLET | ORAL | 0 refills | Status: DC | PRN
Start: 2020-01-08 — End: 2020-11-21

## 2020-01-08 NOTE — Progress Notes (Signed)
[id number]  Caitlin Wiley  DOB:  [birth year]    Referring Physician:  Katina Degree, MD    69 year old [id number]    CC:  Postmenopausal bleeding.    HPI:  This patient underwent menopause in her 79s.  She was on hormone replacement therapy but is not currently on it.  Her vaginal bleeding is described as follows.   Onset: A few weeks ago.  Duration: 1 week.  Quantity: Spotting, only notices when she wiped with a toilet paper  Cramping:  None    She denies abnormal vaginal discharge, itching, or burning.  She also reports dryness or irritation.  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 She has not taken clobetasol because she does not want to use it unless she needs to or has lichen sclerosus.    Of note, she was seen in 08/2017 for PMB in 8/19. Per note, US revealed an endometrial stripe of 7 mm and a 6 mm endocervical polyp.  Endometrial biopsy was done but did not contain endometrial tissue.  She was advised to have hysteroscopy but she opted not to.      Past Medical History:   Diagnosis Date    Muscle cramps 04/2011    Pain of toe of right foot 02/2010    5th toe    Anxiety     Asthma     Cervical disc disease     Depression     GERD (gastroesophageal reflux disease)     Leukocytosis     Osteoarthritis     chronic    PMB (postmenopausal bleeding)      Past Surgical History:   Procedure Laterality Date    PR COLONOSCOPY FLX DX W/COLLJ SPEC WHEN PFRMD  11/2005    PR UNLISTED PROCEDURE FEMUR/KNEE      PR UNLISTED PROCEDURE NECK/THORAX      SURGICAL HX OTHER  08/1999    knee surgery     Review of patient's allergies indicates:  Allergies   Allergen Reactions    Bee Venom Anaphylaxis    Ciprofloxacin Other     Joint aching    Pneumococcal Vaccines Skin: Hives    Sulfa Antibiotics Skin: Hives    Cats [Animals] Skin: Itching    Dust Mite Extract Skin: Itching     DUST    Pollen Extract Skin: Itching     Outpatient  Medications Marked as Taking for the 01/08/20 encounter (Office Visit) with Mechele Dawley, MD   Medication Sig Dispense Refill    Acetaminophen (TYLENOL) 325 MG Oral Tab Take 325 mg by mouth every 6 hours as needed.      albuterol HFA 108 (90 Base) MCG/ACT inhaler INHALE ONE TO TWO PUFFS BY MOUTH EVERY 6 HOURS AS NEEDED FOR SHORTNESS OF BREATH, WHEEZING 18 g 2    apixaban (Eliquis) 5 MG tablet Take 1 tablet (5 mg) by mouth 2 times a day. 180 tablet 3    Cholecalciferol (VITAMIN D3 OR) Take by mouth.      clobetasol 0.05 % ointment Apply topically 2 times a day. Apply to perineum as needed. 45 g 1    famotidine 20 MG tablet Take 1 tablet (20 mg) by mouth 2 times a day. 180 tablet 3    Flaxseed, Linseed, (FLAXSEED OIL) 1200 MG Oral Cap None Entered      folic acid 1 MG tablet Take 3 tablets (3 mg) by mouth daily.  270 tablet 3    GLUCOSAMINE CHONDROITIN COMPLX OR       Magnesium 400 MG Oral Cap Take 400 mg by mouth.      methotrexate 2.5 MG tablet TAKE 8 TABLETS BY MOUTH EVERY 7 DAYS 96 tablet 3    montelukast 10 MG tablet Take 1 tablet (10 mg) by mouth daily. 90 tablet 3    Multiple Vitamins-Minerals (MULTIVITAMIN OR) no iron      Probiotic Product (PROBIOTIC DAILY OR)       SIMETHICONE-80 OR Take 2-3 tablets by mouth daily as needed.      triamterene-hydroCHLOROthiazide 37.5-25 MG tablet Take 1 tablet by mouth daily. 90 tablet 3    Unclassified (OTHER MEDS, SEE COMMENTS,) Curamin oral tab daily       Social History     Tobacco Use    Smoking status: Never Smoker    Smokeless tobacco: Never Used   Substance Use Topics    Alcohol use: No     Alcohol/week: 0.0 standard drinks     Comment: rare     Drug use: No       ROS:  A complete ROS was done, see uploaded form.       EXAM    BP 130/84    Pulse 78    Temp 37.1 C (Temporal)    Ht 5\' 6"  (1.676 m)    Wt (!) 129.7 kg (286 lb)    LMP  (LMP Unknown)    SpO2 96%    BMI 46.16 kg/m   BMI:  Body mass index is 46.16 kg/m.  Physical Exam  Constitutional:        General: She is not in acute distress.     Appearance: Normal appearance. She is not ill-appearing, toxic-appearing or diaphoretic.   Genitourinary:      Vulva normal.      No lesions in the vagina.      Right Labia: skin changes (Some whitening of perineum that extends into the right labia ).      Right Labia: No rash or Bartholin's cyst.     Left Labia: No Bartholin's cyst or rash.     Vulva exam comments: Some whitening of perineum that extends into the right labia .      No inguinal adenopathy present in the right or left side.     No vaginal tenderness, bleeding or ulceration.      No cervical lesion or polyp.      Uterus is anteverted.      Pelvic exam was performed with patient in the lithotomy position.   HENT:      Head: Normocephalic.   Cardiovascular:      Rate and Rhythm: Normal rate and regular rhythm.      Heart sounds: Normal heart sounds. No murmur heard.  No gallop.    Pulmonary:      Effort: Pulmonary effort is normal. No respiratory distress.      Breath sounds: Normal breath sounds. No stridor. No wheezing, rhonchi or rales.   Abdominal:      General: Abdomen is flat. Bowel sounds are normal. There is no distension.      Palpations: Abdomen is soft. There is no mass.      Tenderness: There is no abdominal tenderness. There is no right CVA tenderness, left CVA tenderness, guarding or rebound.   Musculoskeletal:         General: No swelling.      Cervical back: Neck  supple. No rigidity. No muscular tenderness.   Lymphadenopathy:      Lower Body: No right inguinal adenopathy. No left inguinal adenopathy.   Neurological:      Mental Status: She is alert and oriented to person, place, and time.      Gait: Gait normal.   Psychiatric:         Mood and Affect: Mood normal.   Vitals reviewed.         LABS:  No visits with results within 4 Week(s) from this visit.   Latest known visit with results is:   Office Visit on 12/11/2019   Component Date Value    Color, Urine 12/11/2019 YELLOW     Clarity, URN  12/11/2019 CLEAR     Glucose, Urine 12/11/2019 NEG     Bilirubin, Urine 12/11/2019 NEG     Ketones, URN 12/11/2019 NEG     Specific Gravity, Urine 12/11/2019 1.020     Occult Blood, URN 12/11/2019 NEG     pH, URN 12/11/2019 7.0     Protein 12/11/2019 NEG     Urobilinogen, URN 12/11/2019 0.2     Nitrite, URN 12/11/2019 NEG     Leukocytes 12/11/2019 NEG         ASSESSMENT:      ICD-10-CM    1. Postmenopausal bleeding  N95.0 Referral to Gynecology     US Pelvis Complete  W Transvag     miSOPROStol 200 MCG tablet   2. Cervical polyp  N84.1 US Pelvis Complete  W Transvag   3. Vulvar lesion  N90.89        RECOMMENDATIONS:      1. Postmenopausal bleeding  - US Pelvis Complete  W Transvag; Future  - Endometrial biopsy were recommended. R/B/A discussed and she was given an information handout on these procedures.     - Due to small cervical os to dilate cervix for EMB: miSOPROStol 200 MCG tablet ordered; Take 2 tablets (400 mcg) the night before procedure).  Dispense: 2 tablet; Refill: 0  - Per patient, prior EMB was very painful. She is not able to take ibuprofen because she is on blood thinners. Advised to take tylenol prior to procedure. She is interested in a paracervical block.    2. Cervical polyp  - US Pelvis Complete  W Transvag; Future  - Recommended removal of cervical polyp, R/B/A discussed.  - miSOPROStol 200 MCG tablet; Take 2 tablets (400 mcg) by mouth as needed (Take the night before procedure).  Dispense: 2 tablet; Refill: 0    3. Vulvar lesion  - Pt does not want to use clobetasol unless she needs to.  - Recommended vulvar biopsy. R/B/A discussed and she was given an information handout on these procedures.          RTC for EMB, possible cervical polypectomy, and vulvar biopsy.

## 2020-01-08 NOTE — Patient Instructions (Addendum)
Patient Education     Endometrial Biopsy    Endometrial biopsy is a procedure used to study the lining of the uterus. The uterus is also called the endometrium. The biopsy is usually done in your healthcare providers office. During the biopsy, small tissue samples are taken from inside the uterus. These are then sent to a lab for study. If any problems are found, you and your healthcare provider will discuss treatment options. The biopsy usually takes only a fewminutes, and you can often go back to your normal routine as soon as the procedure is over.  Reasons for the procedure  Endometrial biopsy may help pinpoint the cause of certain problems. These include:   Abnormal Pap test results   Bleeding after menopause   Bleeding associated with hormone therapy   Having certain types of cancer   Heavy or irregular menstrual periods   Prolonged bleeding   Trouble getting pregnant (fertility problems)   Damage to the uterine wall (very rare)  What are the risks?  Problems with endometrial biopsy are rare, but can include:   Bleeding   Damage to the uterine wall (very rare)   Infection  Getting ready for the procedure  Yourhealthcare providerwill ask about your health and any medicines you take, like blood thinners. Before your biopsy, you may have tests to make sure youre not pregnant or have an infection. You may also be asked to sign a consent form. You should do the following 1 to 2 days before the biopsy:   Don't use creams or other vaginal medicines.   Don't douche.   Ask your healthcare provider if you should take pain medicines shortly before the test.  During the biopsy  During the biopsy, you will likely experience the following:   You will be asked to lie on an exam table with your knees bent, just as you do for a Pap test.   You may have a brief pelvic exam. An instrument called a speculum is then inserted into the vagina to hold it open.   An antiseptic solution may be applied to the  cervix. The cervix may also be numbed with an anesthetic or dilated to widen the opening.   A small tube is passed through the cervix into the uterus.   It is normal to feel some cramping when the tube is inserted. But tell your healthcare provider if you have severe cramping or are very uncomfortable.   Using mild suction, samples are taken from the uterine lining. You may feel pinching or additional cramping when this is done.   The tube and speculum are then removed and the samples are sent to a lab for study.  After the procedure  After the procedure, you may experience the following:   If you feel lightheaded or dizzy, you can rest on the table until youre ready to get dressed.   For a few hours, you may feel some mild cramping. This can usually be relieved with over-the-counter pain medicines.   You may have some bleeding for a few days. Use pads instead of tampons.   Dont douche or use any vaginal medicines unless your healthcare provider says its OK.   Ask your healthcare provider when its OK to have sex again.  Follow-up care  It will take about a week for the biopsy results to come back from the lab. Then you and your healthcare provider can discuss the results. These may show that no treatment is required. Or you  may be scheduled for a follow-up appointment and more tests. If your biopsy was done for fertility problems, be sure to record the day when your next period begins.  Call your healthcare provider  Call your healthcare provider if you have any of the following:   Fever of100.86F (38C) or higher, or as directed by your healthcare provider   Foul-smelling or unusual vaginal discharge   Heavy bleeding (soaking more than 1 pad an hour for 2 hours)   Severe cramping or increasing pain   StayWell last reviewed this educational content on 06/19/2015   2000-2020 [employer]. [address] [place] [address]. All rights reserved. This information is not intended as  a substitute for professional medical care. Always follow your healthcare professional's instructions.           Patient Education     Understanding Vulvar Biopsy  A vulvar biopsy is a test used to check for vulvar cancer or another skin disease affecting the vulva. The vulva is the outer part of a womans genitals. During a biopsy, small tissue samples are taken from areas of skin that look changed (abnormal). The tissue is then checked in a lab for cancer and other types of skin disease.  How to say it  [id number]  Why a vulvar biopsy is done  A vulvar biopsy may be done if you have patches of skin on your vulva that look abnormal, such as:   Areas of skin that are white, or turn white after a special diluted vinegar-like solution is put on them   Patches of skin that are red, pink, gray, brown, or bumpy   A sore that doesnt heal   A lump or growth on the vulva   Genital warts that dont go away  How a vulvar biopsy is done  The biopsy is a quick procedure. Its often done in a healthcare providers office. You may be told to take over-the-counter pain medicine before the biopsy. This can help prevent pain after the biopsy. This is what the procedure may be like:   The skin in the area is cleaned with special swabs. The healthcare provider may put medicine on the skin to numb it. Then a small needle will be used to inject medicine into the area to help prevent pain during the biopsy.   The healthcare provider may use a tool called (colposcope) to do the biopsy. This scope has a magnifying lens that lets the provider clearly see even small changed areas of skin. The scope stays outside your body.   When the area is numb, the provider will take out a small piece (sample) of the skin. This is done with a small, sharp tool. This is called a punch biopsy. Sometimes a thin slice of the skin is removed. In some cases, the entire patch of changed skin is removed (excisional biopsy). Your healthcare  provider will tell you which kind of biopsy you will have.   If the provider takes a larger piece of skin, the area will then be closed with stitches (sutures).   You will be told how to care for the area after the biopsy to help it heal.  The tissue removed during the biopsy is then checked under the microscope by a special doctor called a pathologist. This doctor looks for precancer or cancer cells in the biopsy sample. You'll get the results in about a week. Your healthcare provider will tell you if you need any  follow-up tests.  Risks of a vulvar biopsy   Pain   Infection   Bleeding   Blood blister (hematoma)   Bruising   Loss of skin color (hypopigmentation) or increased skin color (hyperpigmentation) in the biopsy area   Scarring  Know what to expect after the biopsy. Ask what type of pain medicine you can use and how to care for the biopsy area. Also ask your healthcare provider what signs to watch for and when to call. Know how to get help after office hours and on weekends and holidays.  StayWell last reviewed this educational content on 11/18/2017   2000-2020 [employer]. [address] [place] [address]. All rights reserved. This information is not intended as a substitute for professional medical care. Always follow your healthcare professional's instructions.

## 2020-01-10 DIAGNOSIS — N950 Postmenopausal bleeding: Secondary | ICD-10-CM | POA: Insufficient documentation

## 2020-01-10 DIAGNOSIS — N841 Polyp of cervix uteri: Secondary | ICD-10-CM | POA: Insufficient documentation

## 2020-01-10 DIAGNOSIS — N9089 Other specified noninflammatory disorders of vulva and perineum: Secondary | ICD-10-CM | POA: Insufficient documentation

## 2020-01-23 ENCOUNTER — Ambulatory Visit
Admission: RE | Admit: 2020-01-23 | Discharge: 2020-01-23 | Disposition: A | Discharge status: Home / Self Care | Payer: Medicare HMO | Attending: Diagnostic Radiology | Admitting: Diagnostic Radiology

## 2020-01-23 DIAGNOSIS — N841 Polyp of cervix uteri: Secondary | ICD-10-CM | POA: Insufficient documentation

## 2020-01-23 DIAGNOSIS — N950 Postmenopausal bleeding: Secondary | ICD-10-CM | POA: Insufficient documentation

## 2020-01-25 ENCOUNTER — Encounter (HOSPITAL_BASED_OUTPATIENT_CLINIC_OR_DEPARTMENT_OTHER): Payer: Self-pay | Admitting: Cardiovascular Disease

## 2020-01-25 ENCOUNTER — Telehealth (HOSPITAL_BASED_OUTPATIENT_CLINIC_OR_DEPARTMENT_OTHER): Payer: Self-pay | Admitting: Cardiovascular Disease

## 2020-01-25 NOTE — Telephone Encounter (Signed)
Pt called to report she's having afib. She has a cardiomobile device that shows she has afib and has been experiencing this since last night but on and off. She would like to know what to do. Please f/u.    Thank you,  Pennie Rushing  PSS Lead

## 2020-01-25 NOTE — Telephone Encounter (Signed)
See MyChart message.  She is not in atrial fibrillation, tracings show atrial bigeminy.

## 2020-01-25 NOTE — Telephone Encounter (Signed)
Media Information      Patient Entered Attachment - Document on 01/25/2020: [id number]         Document Information    Patient Upload:  Patient Entered Attachment   [id number]   01/25/2020   Attached To:   Telephone on 01/25/20 with Levert Feinstein, MD     Source Information    Steve Rattler, Patient User

## 2020-01-25 NOTE — Telephone Encounter (Signed)
Called Caitlin Wiley and she describes intermittent "thumping" or "jiggling" in her chest (palpitations) that have been going on for about a week.  Kardia app last night showed possible afib 138 bpm at 10:10 pm and one minute later it was 80's and NSR.  This occured while walking to a room in her home.  Today her Kardia app at 1pm read 129 bpm, and in the morning it was 80's NSR.  While on the phone with her she checked again on Kardia app read NSR.  She reports not hydrating enough so she will do a better job of her intake of fluids.  She will also send [place] via e.care the images of Kardia App.    70 y/o F with hx of PAF and HTN in the setting of sleep apnea and rheumatoid arthritis s/p afib ablation October 2018.  Patient had seen Dr Nicole Kindred on 01/01/20:    A/P:  (I48.0) Paroxysmal atrial fibrillation ([place])  (primary encounter diagnosis)  No definite recurrence of paroxysmal atrial fibrillation, she reports occasional elevated heart rates but they are not rapid.  She has had vaginal bleeding, small amounts, on anticoagulation with Eliquis, and she has an upcoming GYN evaluation.  We agreed to observation for the atrial fibrillation, palpitations for now.  She will report any progression of her symptoms.  We discussed using a low-dose of diltiazem, but she prefers to avoid medical management at this time.  Plan: As above    (I10) Essential hypertension  Blood pressures controlled.  I still encouraged her to lose weight by reducing carbohydrates in her diet and trying to increase her level of activity.  Plan: Continue current therapy, lifestyle changes.    Eliquis 5 mg BID

## 2020-01-25 NOTE — Telephone Encounter (Signed)
Please see e.care message with Kardia app attachment.

## 2020-01-31 ENCOUNTER — Other Ambulatory Visit (INDEPENDENT_AMBULATORY_CARE_PROVIDER_SITE_OTHER): Payer: Self-pay | Admitting: Family Medicine

## 2020-01-31 DIAGNOSIS — R0602 Shortness of breath: Secondary | ICD-10-CM

## 2020-01-31 DIAGNOSIS — J4520 Mild intermittent asthma, uncomplicated: Secondary | ICD-10-CM

## 2020-02-01 MED ORDER — ALBUTEROL SULFATE HFA 108 (90 BASE) MCG/ACT IN AERS
INHALATION_SPRAY | RESPIRATORY_TRACT | 3 refills | Status: DC
Start: 2020-02-01 — End: 2020-12-09

## 2020-02-11 ENCOUNTER — Telehealth (INDEPENDENT_AMBULATORY_CARE_PROVIDER_SITE_OTHER): Payer: Self-pay | Admitting: Obstetrics & Gynecology

## 2020-02-11 NOTE — Telephone Encounter (Signed)
Pt had to reschedule her appointment for 1/25 as she's not feeling well. We rescheduled for 2/15 at 8 am. She has some questions about the medication miSOPROStol, she was told by Dr.Tran to use the medication differently than what was suggested (orally) on the prescription.     Please advise pt has questions on how to prep for her procedure

## 2020-02-11 NOTE — Telephone Encounter (Signed)
Routing to provider  

## 2020-02-12 ENCOUNTER — Encounter (INDEPENDENT_AMBULATORY_CARE_PROVIDER_SITE_OTHER): Payer: Self-pay | Admitting: Obstetrics & Gynecology

## 2020-02-26 ENCOUNTER — Telehealth (INDEPENDENT_AMBULATORY_CARE_PROVIDER_SITE_OTHER): Payer: Medicare HMO | Admitting: Rheumatology

## 2020-02-26 DIAGNOSIS — M0579 Rheumatoid arthritis with rheumatoid factor of multiple sites without organ or systems involvement: Secondary | ICD-10-CM

## 2020-02-26 DIAGNOSIS — M170 Bilateral primary osteoarthritis of knee: Secondary | ICD-10-CM

## 2020-02-26 DIAGNOSIS — Z79899 Other long term (current) drug therapy: Secondary | ICD-10-CM

## 2020-02-26 NOTE — Progress Notes (Signed)
[place]  [address]  TEL: [phone]  l  FAX: [phone]    COVID-19 Telemedicine Note      NOTE:  I decided to perform this telemedicine encounter in lieu of the patient's scheduled in-person encounter with my clinic due to the current COVID-19 outbreak and the risk that an in-person visit would pose to the patient, clinic staff, or the general public.    I conducted this encounter from [facility] via secure, live, face-to-face video conference with the patient. Caitlin Wiley was located at home.  Prior to the interview, the risks and benefits of telemedicine were discussed with the patient and verbal consent was obtained.      02/26/2020    IDENTIFYING DATA:  MRN: [id number]  DOB: 09/28/1950    CHIEF COMPLAINT:      Caitlin Wiley is a very pleasant 70 year old female who presents for follow up of rheumatoid arthritis, osteoarthritis.    RHEUMATOLOGIC HISTORY OF PRESENT ILLNESS:  Diagnosis:RA, OA; initial contult 11/30/2017  Presenting Symptoms/Timeline:2013 joint pain hands, wrists, knees, shoulders  Autoimmune Serologies:RF 140, CCP 16; CRP 17, ESR 23; UA 4.6  Pertinent Radiology:Xrays of hands, wrists, knees, feet: No erosions, consistent with OA, chondrocalcinosis  Medications Used:MXT 11/2017  Significant Flares:  Immunization Status: Influenza, PCV13, PPSV23, Zoster  Infectious Disease Screening: HepatitisB NR,Hepatitis C, TB quantiferon NR  Other: Afib on chronic anticoag,    INTERVAL HISTORY:  Patient seen today for follow-up of rheumatoid arthritis, osteoarthritis. She was last seen 4 months ago at which time she had stable disease. Patient is on methotrexate monotherapy for treatment of her inflammatory arthritis. Patient also has significant osteoarthritis particularly in her bilateral knees.    Patient had "flare" in January 20. The day before had carried two bags 20 lbs of bird seeds. Next day, each step going down stair was  like "broken glass". Mostly walking down the stair. Rest helped. Lasted 4 days. Some swelling with warm to touch. The following week had more diffuse pain involving the shoulders and wrists.    Now back to normal. Now typical OA knee pain with the swelling and warmth improved. Otherwise reports stable health. Tolerating MTX which does help her joints overall. No infectious complications. Have regained weight she has lost previously. Currently doing [place].    REVIEW OF SYSTEMS:  Complete ROS is negative, except for those mentioned above in the Interval History    PROBLEM LIST:  Patient Active Problem List    Diagnosis Date Noted    Postmenopausal bleeding [N95.0] 01/10/2020    Cervical polyp [N84.1] 01/10/2020    Vulvar lesion [N90.89] 01/10/2020    Rheumatoid arthritis involving multiple sites with positive rheumatoid factor [M05.79] 02/08/2018    Primary osteoarthritis of both knees [M17.0] 09/12/2017    Chronic anticoagulation [Z79.01] 08/02/2015    Right cervical radiculopathy [M54.12] 05/30/2015    Morbid obesity with BMI of 40.0-44.9, adult ([place]) [E66.01, Z68.41] 05/30/2015    Paroxysmal atrial fibrillation ([place]) [I48.0] 04/17/2014     A. S/p ablation (Cryo PVI) 10/19/2016      GERD (gastroesophageal reflux disease) [K21.9] 11/08/2012    OA (osteoarthritis) [M19.90] 07/28/2012    Asthma [J45.909] 07/24/2012    Edema [R60.9] 07/24/2012    Sleep apnea [G47.30] 07/24/2012    Pulmonary nodule [R91.1] 07/24/2012    Grief reaction [F43.21] 07/24/2012     05/11/2011  SOCIAL/FAMILY HISTORY:  Reviewed with patient. No changes from what is currently noted in EPIC    ALLERGIES:  Review of patient's allergies indicates:  Allergies   Allergen Reactions    Bee Venom Anaphylaxis    Ciprofloxacin Other     Joint aching    Pneumococcal Vaccines Skin: Hives    Sulfa Antibiotics Skin: Hives    Cats [Animals] Skin: Itching    Dust Mite Extract Skin: Itching     DUST    Pollen Extract Skin: Itching        MEDICATIONS:  Current Outpatient Medications   Medication Sig Dispense Refill    Acetaminophen (TYLENOL) 325 MG Oral Tab Take 325 mg by mouth every 6 hours as needed.      albuterol HFA 108 (90 Base) MCG/ACT inhaler INHALE ONE TO TWO PUFFS BY MOUTH EVERY 6 HOURS AS NEEDED FOR SHORTNESS OF BREATH, WHEEZING 18 g 3    apixaban (Eliquis) 5 MG tablet Take 1 tablet (5 mg) by mouth 2 times a day. 180 tablet 3    Cholecalciferol (VITAMIN D3 OR) Take by mouth.      clobetasol 0.05 % ointment Apply topically 2 times a day. Apply to perineum as needed. 45 g 1    famotidine 20 MG tablet Take 1 tablet (20 mg) by mouth 2 times a day. 180 tablet 3    Flaxseed, Linseed, (FLAXSEED OIL) 1200 MG Oral Cap None Entered      folic acid 1 MG tablet Take 3 tablets (3 mg) by mouth daily. 270 tablet 3    GLUCOSAMINE CHONDROITIN COMPLX OR       Magnesium 400 MG Oral Cap Take 400 mg by mouth.      methotrexate 2.5 MG tablet TAKE 8 TABLETS BY MOUTH EVERY 7 DAYS 96 tablet 3    miSOPROStol 200 MCG tablet Take 2 tablets (400 mcg) by mouth as needed (Take the night before procedure). 2 tablet 0    montelukast 10 MG tablet Take 1 tablet (10 mg) by mouth daily. 90 tablet 3    Multiple Vitamins-Minerals (MULTIVITAMIN OR) no iron      Probiotic Product (PROBIOTIC DAILY OR)       SIMETHICONE-80 OR Take 2-3 tablets by mouth daily as needed.      triamterene-hydroCHLOROthiazide 37.5-25 MG tablet Take 1 tablet by mouth daily. 90 tablet 3    Unclassified (OTHER MEDS, SEE COMMENTS,) Curamin oral tab daily       No current facility-administered medications for this visit.       LABS:  Results for orders placed or performed in visit on 12/11/19   POC Urine Dipstick, Automated   Result Value Ref Range    Color, Urine YELLOW     Clarity, URN CLEAR     Glucose, Urine NEG NEG mg/dL    Bilirubin, Urine NEG NEG    Ketones, URN NEG NEG mg/dL    Specific Gravity, Urine 1.020 1.005 - 1.030    Occult Blood, URN NEG NEG    pH, URN 7.0 5.0 - 8.0     Protein NEG NEG-TRACE mg/dL    Urobilinogen, URN 0.2 0.2 - 1.0 E.U./dL    Nitrite, URN NEG NEG    Leukocytes NEG NEG       RADIOLOGY:      ASSESSMENT AND PLAN:  Caitlin Wiley is a very pleasant 70 year old female who presents for follow up of rheumatoid arthritis, osteoarthritis.      #.Rheumatoid arthritis.Seropositive, nonerosive  RA.Doingmuch better since MTX. No evidence of synovitis on exam today. Knee pain, CMC joint pain sounds mechanical in nature and likely related to osteoarthritis. We discussed therapeutic options including corticosteroid or hyaluronic acid derivative injection in the future. She is a candidate for total knee replacement surgery once she is able to reach her target BMI.     Suspect inflammatory flare in bilateral knee ie crystal vs RA. Xrays showed chondrocalcinosis in the knees.  Flare now resolved.    -continuemethotrexate 56m once a week  -continuefolic [id number]    #. High risk medication. MTX toxicitylabs at next visit.    #. Osteoarthritis. Most bothersome in the kneesand is activity related.She is interested in TKR but will need to lose some weight first which I am hopeful now that she is more active. She has tried PT and continues with the exercises.    #. Immunization. She is due for influenza vaccine.    #. Bone health.Last DEXA from 2017 was normal.DEXA 06/2018 normal.    -optimization of calcium and vitamin D  - daily weight bearing exercises     I spent 30 minutes on this visit today, including chart review prior to the visit, face-to-face time with the patient, and documentation and coordination of care after the visit.      All the patient's questions were answered. The patient will follow up in 4-6 weeks.    CC: PCP    Dictated using speech recognition technology and electronically signed by YRoxy HorsemanMD   Note: This document may contain errors inherent to this technology. If you find any errors that might affect patient care,  please contact our office as soon as possible.   [address], [place] Ph. [phone]. [phone]

## 2020-03-04 ENCOUNTER — Encounter (INDEPENDENT_AMBULATORY_CARE_PROVIDER_SITE_OTHER): Payer: Self-pay | Admitting: Obstetrics & Gynecology

## 2020-03-13 ENCOUNTER — Ambulatory Visit: Payer: Medicare HMO | Attending: Anatomic Pathology | Admitting: Obstetrics & Gynecology

## 2020-03-13 ENCOUNTER — Ambulatory Visit (INDEPENDENT_AMBULATORY_CARE_PROVIDER_SITE_OTHER): Payer: Medicare HMO | Admitting: Obstetrics & Gynecology

## 2020-03-13 VITALS — BP 133/76 | HR 82 | Temp 98.4°F | Wt 288.0 lb

## 2020-03-13 DIAGNOSIS — N9089 Other specified noninflammatory disorders of vulva and perineum: Secondary | ICD-10-CM

## 2020-03-13 DIAGNOSIS — N909 Noninflammatory disorder of vulva and perineum, unspecified: Secondary | ICD-10-CM

## 2020-03-13 DIAGNOSIS — R9389 Abnormal findings on diagnostic imaging of other specified body structures: Secondary | ICD-10-CM

## 2020-03-13 DIAGNOSIS — N950 Postmenopausal bleeding: Secondary | ICD-10-CM | POA: Insufficient documentation

## 2020-03-13 NOTE — Progress Notes (Signed)
ENDOMETRIAL BIOPSY    Caitlin Wiley is a 70 year old [place] who presents today for endometrial biopsy.  INDICATION: postmenopausal bleeding, abnormal ultrasound: thickened endometrium    No LMP recorded (lmp unknown). Patient is postmenopausal.  Urine pregnancy test was not done.    The risks (including infection, bleeding, pain, and uterine perforation) and benefits of the procedure were explained to the patient: YES  Costs and insurance coverage discussed: NO  Treatment options reviewed: NO      PROCEDURE NOTE     Correct patient NAME and ID YES   Correct PROCEDURE YES   Correct EQUIPMENT and SETTINGS YES   Completed CONSENT YES, Date: 03/13/2020     The patient was placed in the dorsal lithotomy position.  Exam showed the uterus to be in the anteverted position.  A speculum was inserted in the vagina, and the cervix prepped with povidone iodine.No cervical polyp seen.  Endocervical curettage with a Kevorkian curette was not performed.    Lidocaine 1% was injected at the anterior cervical lip. A single tooth tenaculum was applied to the anterior lip of the cervix. A paracervical block was done secondary to pain by injecting lidocaine 1% in a two-point fashion and a total of 10 cc of lidocaine 1% was used. The uterus was sounded to a depth of 7.5 cm.  A Pipelle endometrial aspirator was used to sample the endometrium.  Sample was sent for pathologic examination.    The patient tolerated the procedure well. Hemostasis was noted.  Complications: none    The patient was advised to call for any fever or for prolonged or severe pain or bleeding. She was advised to use OTC acetaminophen as needed for mild to moderate pain.    Procedure performed by Clinician         Vulvar Biopsy    Indication: Vulvar lesion  Site(s): Right vulvar/perineum area about 7 o'clock from introitus    Prep: Povidone iodine  Anesthesia: 1% lidocaine    Description of procedure:    The risk of bleeding, infection, pain, nerve damage and  the benefit of obtaining a potential diagnosis were explained to the patient. The patient agreed to the procedure after being informed of the risks and benefits.    Final verification was performed.  After prepping the skin and with anesthesia, a 4 mm punch biopsy instrument was used to obtain the specimen. Specimen observed to be in the container correctly labeled with the patient's name and sent to the lab. Hemostasis was obtained using fast-absorbing suture and silver nitrate. The patient tolerated the procedure well. Patient was given wound care instructions.

## 2020-03-18 LAB — PATHOLOGY, SURGICAL

## 2020-03-31 ENCOUNTER — Other Ambulatory Visit (INDEPENDENT_AMBULATORY_CARE_PROVIDER_SITE_OTHER): Payer: Self-pay | Admitting: Obstetrics & Gynecology

## 2020-04-08 ENCOUNTER — Ambulatory Visit: Payer: Medicare HMO | Attending: Rheumatology

## 2020-04-08 ENCOUNTER — Ambulatory Visit (INDEPENDENT_AMBULATORY_CARE_PROVIDER_SITE_OTHER): Payer: Medicare HMO | Admitting: Rheumatology

## 2020-04-08 VITALS — BP 128/80 | HR 89 | Wt 288.0 lb

## 2020-04-08 DIAGNOSIS — M170 Bilateral primary osteoarthritis of knee: Secondary | ICD-10-CM

## 2020-04-08 DIAGNOSIS — M0579 Rheumatoid arthritis with rheumatoid factor of multiple sites without organ or systems involvement: Secondary | ICD-10-CM

## 2020-04-08 DIAGNOSIS — Z79899 Other long term (current) drug therapy: Secondary | ICD-10-CM

## 2020-04-08 LAB — CBC, DIFF
% Basophils: 1 %
% Eosinophils: 2 %
% Immature Granulocytes: 0 %
% Lymphocytes: 18 %
% Monocytes: 6 %
% Neutrophils: 73 %
% Nucleated RBC: 0 %
Absolute Eosinophil Count: 0.21 10*3/uL (ref 0.00–0.50)
Absolute Lymphocyte Count: 1.87 10*3/uL (ref 1.00–4.80)
Basophils: 0.06 10*3/uL (ref 0.00–0.20)
Hematocrit: 47 % — ABNORMAL HIGH (ref 36.0–45.0)
Hemoglobin: 14.9 g/dL (ref 11.5–15.5)
Immature Granulocytes: 0.03 10*3/uL (ref 0.00–0.05)
MCH: 29 pg (ref 27.3–33.6)
MCHC: 31.8 g/dL — ABNORMAL LOW (ref 32.2–36.5)
MCV: 91 fL (ref 81–98)
Monocytes: 0.64 10*3/uL (ref 0.00–0.80)
Neutrophils: 7.88 10*3/uL — ABNORMAL HIGH (ref 1.80–7.00)
Nucleated RBC: 0 10*3/uL
Platelet Count: 310 10*3/uL (ref 150–400)
RBC: 5.14 10*6/uL — ABNORMAL HIGH (ref 3.80–5.00)
RDW-CV: 14.7 % — ABNORMAL HIGH (ref 11.6–14.4)
WBC: 10.69 10*3/uL — ABNORMAL HIGH (ref 4.3–10.0)

## 2020-04-08 LAB — COMPREHENSIVE METABOLIC PANEL
ALT (GPT): 12 U/L (ref 7–33)
AST (GOT): 14 U/L (ref 9–38)
Albumin: 4.2 g/dL (ref 3.5–5.2)
Alkaline Phosphatase (Total): 71 U/L (ref 38–172)
Anion Gap: 9 (ref 4–12)
Bilirubin (Total): 0.7 mg/dL (ref 0.2–1.3)
Calcium: 9.3 mg/dL (ref 8.9–10.2)
Carbon Dioxide, Total: 27 meq/L (ref 22–32)
Chloride: 105 meq/L (ref 98–108)
Creatinine: 0.99 mg/dL (ref 0.38–1.02)
Glucose: 97 mg/dL (ref 62–125)
Potassium: 3.8 meq/L (ref 3.6–5.2)
Protein (Total): 6.6 g/dL (ref 6.0–8.2)
Sodium: 141 meq/L (ref 135–145)
Urea Nitrogen: 22 mg/dL — ABNORMAL HIGH (ref 8–21)
eGFR by CKD-EPI: 58 mL/min/{1.73_m2} — ABNORMAL LOW (ref >59)

## 2020-04-08 LAB — C_REACTIVE PROTEIN: C_Reactive Protein: 10.1 mg/L — ABNORMAL HIGH (ref 0.0–10.0)

## 2020-04-08 LAB — SED RATE: Erythrocyte Sedimentation Rate: 16 mm/h (ref 0–20)

## 2020-04-08 NOTE — Progress Notes (Signed)
[place]  [address]  TEL: [phone]  l  FAX: [phone]    Follow Up Clinic Note  04/08/2020    IDENTIFYING DATA/CC:    Caitlin Wiley is a very pleasant 70 year old female who presents for follow up of rheumatoid arthritis, osteoarthritis.    RHEUMATOLOGIC HISTORY OF PRESENT ILLNESS:  Diagnosis:RA, OA; initial contult 11/30/2017  Presenting Symptoms/Timeline:2013 joint pain hands, wrists, knees, shoulders  Autoimmune Serologies:RF 140, CCP 16; CRP 17, ESR 23; UA 4.6  Pertinent Radiology:Xrays of hands, wrists, knees, feet: No erosions, consistent with OA, chondrocalcinosis  Medications Used:MXT 11/2017  Significant Flares:  Immunization Status: Influenza, PCV13, PPSV23, Zoster  Infectious Disease Screening: HepatitisB NR,Hepatitis C, TB quantiferon NR  Other: Afib on chronic anticoag,    INTERVAL HISTORY:  Patient returns for follow-up of rheumatoid arthritis and osteoarthritis.  She was last seen 6 weeks ago at which time she had stable disease.  She did report a flare in January after heavy lifting.  Patient is on methotrexate monotherapy for treatment of her inflammatory arthritis.  She also has significant osteoarthritis particularly in her knees.    Reports stable symptoms overall.  She has not had the severe flare in her knees that she previously reported.  She does continue to have knee pain as well as bilateral CMC joint pain and lower back pain.  She is not as physically active as she used to be.  She has also gained weight this past winter.  She is trying to lose weight but has been difficult.  She believes is related to ongoing depressive symptoms.    She continues to tolerate methotrexate without side effect.  No infectious complications.    REVIEW OF SYSTEMS:  Complete ROS is negative, except for those mentioned above in the Interval History    PROBLEM LIST:  Patient Active Problem List    Diagnosis Date Noted    Postmenopausal  bleeding [N95.0] 01/10/2020    Cervical polyp [N84.1] 01/10/2020    Vulvar lesion [N90.89] 01/10/2020    Rheumatoid arthritis involving multiple sites with positive rheumatoid factor [M05.79] 02/08/2018    Primary osteoarthritis of both knees [M17.0] 09/12/2017    Chronic anticoagulation [Z79.01] 08/02/2015    Right cervical radiculopathy [M54.12] 05/30/2015    Morbid obesity with BMI of 40.0-44.9, adult ([place]) [E66.01, Z68.41] 05/30/2015    Paroxysmal atrial fibrillation ([place]) [I48.0] 04/17/2014     A. S/p ablation (Cryo PVI) 10/19/2016      GERD (gastroesophageal reflux disease) [K21.9] 11/08/2012    OA (osteoarthritis) [M19.90] 07/28/2012    Asthma [J45.909] 07/24/2012    Edema [R60.9] 07/24/2012    Sleep apnea [G47.30] 07/24/2012    Pulmonary nodule [R91.1] 07/24/2012    Grief reaction [F43.21] 07/24/2012     05/11/2011         SOCIAL/FAMILY HISTORY:  Reviewed with patient. No changes from what is currently noted in EPIC    ALLERGIES:  Review of patient's allergies indicates:  Allergies   Allergen Reactions    Bee Venom Anaphylaxis    Ciprofloxacin Other     Joint aching    Pneumococcal Vaccines Skin: Hives    Sulfa Antibiotics Skin: Hives    Cats [Animals] Skin: Itching    Dust Mite Extract Skin: Itching     DUST    Pollen Extract Skin: Itching       MEDICATIONS:  Current Outpatient Medications   Medication Sig Dispense  Refill    Acetaminophen (TYLENOL) 325 MG Oral Tab Take 325 mg by mouth every 6 hours as needed.      albuterol HFA 108 (90 Base) MCG/ACT inhaler INHALE ONE TO TWO PUFFS BY MOUTH EVERY 6 HOURS AS NEEDED FOR SHORTNESS OF BREATH, WHEEZING 18 g 3    apixaban (Eliquis) 5 MG tablet Take 1 tablet (5 mg) by mouth 2 times a day. 180 tablet 3    Cholecalciferol (VITAMIN D3 OR) Take by mouth.      clobetasol 0.05 % ointment Apply topically 2 times a day. Apply to perineum as needed. 45 g 1    famotidine 20 MG tablet Take 1 tablet (20 mg) by mouth 2 times a day. 180 tablet 3     Flaxseed, Linseed, (FLAXSEED OIL) 1200 MG Oral Cap None Entered      folic acid 1 MG tablet Take 3 tablets (3 mg) by mouth daily. 270 tablet 3    GLUCOSAMINE CHONDROITIN COMPLX OR       Magnesium 400 MG Oral Cap Take 400 mg by mouth.      methotrexate 2.5 MG tablet TAKE 8 TABLETS BY MOUTH EVERY 7 DAYS 96 tablet 3    miSOPROStol 200 MCG tablet Take 2 tablets (400 mcg) by mouth as needed (Take the night before procedure). 2 tablet 0    montelukast 10 MG tablet Take 1 tablet (10 mg) by mouth daily. 90 tablet 3    Multiple Vitamins-Minerals (MULTIVITAMIN OR) no iron      Probiotic Product (PROBIOTIC DAILY OR)       SIMETHICONE-80 OR Take 2-3 tablets by mouth daily as needed.      triamterene-hydroCHLOROthiazide 37.5-25 MG tablet Take 1 tablet by mouth daily. 90 tablet 3    Unclassified (OTHER MEDS, SEE COMMENTS,) Curamin oral tab daily       No current facility-administered medications for this visit.       PHYSICAL EXAMINATION:  Vital signs:  BP 128/80    Pulse 89    Wt (!) 130.6 kg (288 lb)    LMP  (LMP Unknown)    SpO2 96%    BMI 46.48 kg/m    Wt Readings from Last 10 Encounters:   04/08/20 (!) 130.6 kg (288 lb)   03/13/20 (!) 130.6 kg (288 lb)   01/08/20 (!) 129.7 kg (286 lb)   01/01/20 (!) 129.7 kg (286 lb)   11/22/19 (!) 125.6 kg (277 lb)   10/23/19 (!) 126.1 kg (278 lb)   06/26/19 (!) 126.1 kg (278 lb)   02/27/19 (!) 122.9 kg (271 lb)   10/17/18 (!) 116.6 kg (257 lb)   10/13/18 (!) 116.6 kg (257 lb)     General:  Awake, alert, and oriented, no apparent distress, pleasant, and cooperative  Psychologic:  Mood is euthymic, affect is congruent  EYES: Anicteric sclera, no conjunctival injection  ENT:  Normocephalic, atraumatic, moist membranes, no oral ulcers   Pulmonary:  Non-labored breathing, no wheezes, rhonchi, or rales  Cardiovascular:  RRR, no murmurs, rubs or gallops  Skin:  Normal temperature and texture, No visible rashes, ulcers, or nodules  Neurologic:  Light touch sensation is grossly intact.  Face symmetric   Musculoskeletal:     Gait: Normal   Joints: No evidence of synovitis, joint swelling, or tenderness in the joints with exception of bilateral medial knee pain with palpation.   Nails: No pitting, clubbing, or cyanosis noted in the fingernails bilaterally    Muscle strength: Strength is  grossly 5 out of 5 throughout the bilateral upper and lower extremities    LABS:    Pending    RADIOLOGY:   None    DISEASE ACTIVITY:  Patient Function: 1.3  Patient Pain Assessment: 4  Patient Global Assessment: 2   Combined Rapid 3 Score: 7.3    ASSESSMENT AND PLAN:  Caitlin Wiley is a very pleasant 70 year old female who presents for follow up of rheumatoid arthritis.    #.Rheumatoid arthritis.Seropositive, nonerosive RA.Doingmuch better since MTX.No evidence of synovitis on exam today.Knee pain, CMC joint pain sounds mechanical in nature and likely related to osteoarthritis. We discussed therapeutic options including corticosteroid or hyaluronic acid derivative injection in the future. She is a candidate for total knee replacement surgery once she is able to reach her target BMI.     Suspect inflammatory flare in bilateral knee ie crystal vs RA. Xrays showed chondrocalcinosis in the knees.    No evidence of synovitis on knees today.  X-rays show advanced tricompartmental OA.  She received corticosteroid injection by Dr. Patrick Jupiter in 2019.  She recalls that it only helped in one of her knee.  We discussed potentially trying Orthovisc for Synvisc injection.  Patient will need to think about this due to out-of-pocket cost.    -continuemethotrexate 52m once a week  -continuefolic [id number]    #. High risk medication. MTX toxicitylabs  today.    #. Osteoarthritis. Most bothersome in the kneesand is activity related.She is interested in TKR but will need to lose some weight first.  She has gained weight this winter.  I recommend that she follow-up with her PCP to discuss how her  mood condition may be affecting her health such as lack of desire to exercise, dietary intake.  She could also discuss with her PCP about a referral to weight loss clinic.    #. Immunization. She is due for influenza vaccine.    #. Bone health.Last DEXA from 2017 was normal.DEXA 06/2018 normal.    -optimization of calcium and vitamin D  - daily weight bearing exercises       All the patient's questions were answered. The patient will follow up in 4 months.    Dictated using sChief of Staffand electronically signed by YRoxy Horseman MD   Note: This document may contain errors inherent to this technology. If you find any errors that might affect patient care, please contact our office as soon as possible.   [address], [place] Ph. [phone]. [phone]

## 2020-04-08 NOTE — Patient Instructions (Signed)
It was a pleasure seeing you in clinic today.     Continue your current medications   We can do Orthovisc or Synvisc injection to the knees to see if this helps   Get labs after your appointment today   Follow up in 4 months    If you have any questions or concerns before your next appointment, please do not hesitate to contact me. Our clinic phone number is [phone] and I am also available via e[facility].

## 2020-04-09 NOTE — Result Encounter Note (Signed)
Labs are stable. CRP slightly elevated. This can be due to weight gain but we should monitor. Your other marker of inflammation was negative. Also hematocrit slightly elevated for the last couple of draws. Recommend follow up with primary care to discuss whether evaluation is needed ie referral for sleep apnea, breathing test etc

## 2020-04-15 ENCOUNTER — Encounter (INDEPENDENT_AMBULATORY_CARE_PROVIDER_SITE_OTHER): Payer: Self-pay | Admitting: Obstetrics & Gynecology

## 2020-04-23 ENCOUNTER — Ambulatory Visit (HOSPITAL_BASED_OUTPATIENT_CLINIC_OR_DEPARTMENT_OTHER): Payer: Medicare HMO

## 2020-04-25 ENCOUNTER — Ambulatory Visit (HOSPITAL_BASED_OUTPATIENT_CLINIC_OR_DEPARTMENT_OTHER): Payer: Medicare HMO

## 2020-04-25 DIAGNOSIS — Z23 Encounter for immunization: Secondary | ICD-10-CM

## 2020-04-30 ENCOUNTER — Ambulatory Visit (INDEPENDENT_AMBULATORY_CARE_PROVIDER_SITE_OTHER): Payer: Medicare HMO | Admitting: Obstetrics & Gynecology

## 2020-04-30 VITALS — BP 119/74 | HR 76 | Temp 98.2°F

## 2020-04-30 DIAGNOSIS — L900 Lichen sclerosus et atrophicus: Secondary | ICD-10-CM

## 2020-04-30 MED ORDER — CLOBETASOL PROPIONATE 0.05 % EX OINT
TOPICAL_OINTMENT | CUTANEOUS | 1 refills | Status: DC
Start: 2020-04-30 — End: 2021-05-04

## 2020-04-30 NOTE — Progress Notes (Signed)
5/40/0867    [name]  70 year old G0P0000      No LMP recorded (lmp unknown). Patient is postmenopausal.  History   Sexual Activity    Sexual activity: Not Currently       She is here for follow-up of LS.  She states her vulvar irritation has improved with clobetasol.     Exam:  BP 119/74    Pulse 76    Temp 36.8 C (Temporal)    LMP  (LMP Unknown)    SpO2 96%   BMI:  There is no height or weight on file to calculate BMI.  General:  Alert and oriented.  No acute distress.  Mood and Affect:  Normal.     ASSESSMENT:      [id number]    1. Lichen sclerosus  O67.1 clobetasol 0.05 % ointment       PLAN:    First-line therapy -- The initial management of LS involves patient education and medical therapy.  Patient education discussed:  ?Discussion of the patient's individual situation, with tactful coverage of architectural changes that may have occurred.  ?Discussion of the chronicity of vulvar LS and the expectation for frequent recurrences and remissions of signs and symptoms.  ?Reassurance that although LS has the potential to distort or destroy vulvar anatomy, the condition is manageable. Topical corticosteroid treatment is a highly effective therapy that can inhibit progression of the disease.  ?Discussion of the components of good vulvar hygiene and the importance of cessation of scratching to minimize exposure to factors that may exacerbate symptoms.  ?Discussion of the increased risk of malignancy in adult women with LS, the importance of self-examination for signs of malignancy, and the need for clinical follow-up at least once yearly. Patients should return for evaluation if they detect thickened areas of skin or sores that do not heal, particularly if resolution does not occur after a period of intensive topical therapy to the area.  ?Discussion of sexual function and any concerns for the patient and/or their partner. Patients should be reassured that their disease is not contagious. A referral to  a sexual counsellor to address any problems may be required.    1. Lichen sclerosus  - clobetasol 0.05 % ointment; Apply topically 3 times a week. Apply to vulvar area.  Dispense: 60 g; Refill: 1    Will reevaluate the patient after 12 weeks to ensure disease is adequately controlled with the maintenance regimen. If symptoms recur during or after tapering, the frequency of treatment is increased until there is satisfactory clinical improvement.     RTC in 12 weeks.

## 2020-04-30 NOTE — Progress Notes (Signed)
Visit: F/u on EMB results     Refills? NO  Referral? NO  Letter or Form? NO  Lab Results? NO    HEALTH MAINTENANCE:  Has the patient has this done since their last visit?  Cervical screening/PAP: N/A  Mammo: UTD  Colon Screen: UTD  Diabetic Eye Exam (If applicable): N/A    Have you seen a specialist since your last visit: No    Vaccines Due? Yes, tdap    Does patient have eCare?  YES    HM Due:   Health Maintenance   Topic Date Due    Zoster Vaccine (1 of 2) Never done    DTaP, Tdap, and Td Vaccines (3 - Td or Tdap) 03/13/2020    Osteoporosis Screening  06/19/2020    Depression Screening (PHQ-2)  11/20/2020    Medicare Annual Wellness Visit  11/21/2020    Breast Cancer Screening  09/05/2021    Lipid Disorders Screening  08/24/2022    Diabetes Screening  04/09/2023    Colorectal Cancer Screening  05/14/2026    Pneumococcal Vaccine: 65+ years  Completed    Influenza Vaccine  Completed    Hepatitis C Screening  Completed    COVID-19 Vaccine  Completed    Hepatitis A Vaccine  Aged Out    Hepatitis B Vaccine  Aged Out       PCP Verified?  Yes, El-Attar, Hanley Seamen, MD

## 2020-07-29 ENCOUNTER — Ambulatory Visit (INDEPENDENT_AMBULATORY_CARE_PROVIDER_SITE_OTHER): Payer: Medicare HMO | Admitting: Obstetrics & Gynecology

## 2020-07-29 VITALS — Wt 289.2 lb

## 2020-07-29 DIAGNOSIS — L900 Lichen sclerosus et atrophicus: Secondary | ICD-10-CM

## 2020-07-29 NOTE — Progress Notes (Signed)
Visit: Follow up on lichen scerlosus     Refills? NO  Referral? NO  Letter or Form? NO  Lab Results? NO    HEALTH MAINTENANCE:  Has the patient has this done since their last visit?  Cervical screening/PAP: N/A  Mammo: N/A  Colon Screen: N/A  Diabetic Eye Exam (If applicable): N/A    Have you seen a specialist since your last visit: No    Vaccines Due? Yes, Zoster, Tdap     Does patient have eCare?  Yes     HM Due:   Health Maintenance   Topic Date Due   . Zoster Vaccine (1 of 2) Never done   . DTaP, Tdap, and Td Vaccines (3 - Td or Tdap) 03/13/2020   . Osteoporosis Screening  06/19/2020   . Influenza Vaccine (1) 10/18/2020   . Depression Screening (PHQ-2)  11/20/2020   . Medicare Annual Wellness Visit  11/21/2020   . Colorectal Cancer Screening  04/02/2021   . Breast Cancer Screening  09/05/2021   . Lipid Disorders Screening  08/24/2022   . Diabetes Screening  04/09/2023   . Pneumococcal Vaccine: 65+ years  Completed   . Hepatitis C Screening  Completed   . COVID-19 Vaccine  Completed   . Hepatitis A Vaccine  Aged Out   . Hepatitis B Vaccine  Aged Out       PCP Verified?  Yes, El-Attar, Hanley Seamen, MD

## 2020-07-29 NOTE — Progress Notes (Signed)
3/47/4259    Caitlin Wiley  70 year old [id number]      No LMP recorded (lmp unknown). Patient is postmenopausal.  History   Sexual Activity   . Sexual activity: Not Currently       She is here for follow-up of LS.  Pt has been using the clobetasol three times a week. She has been applying it to her perineum and inguinal crease area because those areas felt dry. She noticed improvement in her perineum. She tries not to wear underwear at home.    Exam:  BP (P) 119/66   Pulse (P) 67   Temp (P) 36.6 C (Temporal)   Wt (!) 131.2 kg (289 lb 3.2 oz)   LMP  (LMP Unknown)   SpO2 (P) 99%   BMI 46.68 kg/m   BMI:  Body mass index is 46.68 kg/m.  General:  Alert and oriented.  No acute distress.  Mood and Affect:  Normal.   Physical Exam  Genitourinary:      Right Labia: skin changes (mild whitening on right labia minora, less whitening of the perineum).      Right Labia: No rash, tenderness, lesions or Bartholin's cyst.     Left Labia: No tenderness, lesions, skin changes, Bartholin's cyst or rash.     Vulva exam comments: Almost absent right labia minora, small left labia minora, normal appearing bilateral labia majora, normal appearing inguinal creases bilaterally.           Labs:  No visits with results within 4 Week(s) from this visit.   Latest known visit with results is:   Office Visit on 04/08/2020   Component Date Value   . WBC 04/08/2020 10.69 (A)   . RBC 04/08/2020 5.14 (A)   . Hemoglobin 04/08/2020 14.9    . Hematocrit 04/08/2020 47 (A)   . MCV 04/08/2020 91    . [facility] 04/08/2020 29.0    . MCHC 04/08/2020 31.8 (A)   . Platelet Count 04/08/2020 310    . RDW-CV 04/08/2020 14.7 (A)   . % Neutrophils 04/08/2020 73    . % Lymphocytes 04/08/2020 18    . % Monocytes 04/08/2020 6    . % Eosinophils 04/08/2020 2    . % Basophils 04/08/2020 1    . % Immature Granulocytes 04/08/2020 0    . Neutrophils 04/08/2020 7.88 (A)   . Absolute Lymphocyte Count 04/08/2020 1.87    . Monocytes 04/08/2020 0.64    . Absolute  Eosinophil Count 04/08/2020 0.21    . Basophils 04/08/2020 0.06    . Immature Granulocytes 04/08/2020 0.03    . Nucleated RBC 04/08/2020 0.00    . % Nucleated RBC 04/08/2020 0    . Sodium 04/08/2020 141    . Potassium 04/08/2020 3.8    . Chloride 04/08/2020 105    . Carbon Dioxide, Total 04/08/2020 27    . Anion Gap 04/08/2020 9    . Glucose 04/08/2020 97    . Urea Nitrogen 04/08/2020 22 (A)   . Creatinine 04/08/2020 0.99    . Protein (Total) 04/08/2020 6.6    . Albumin 04/08/2020 4.2    . Bilirubin (Total) 04/08/2020 0.7    . Calcium 04/08/2020 9.3    . AST (GOT) 04/08/2020 14    . Alkaline Phosphatase (To* 04/08/2020 71    . ALT (GPT) 04/08/2020 12    . eGFR by CKD-EPI 04/08/2020 58 (A)   . C_Reactive Protein 04/08/2020 10.1 (A)   .  Erythrocyte Sedimentatio* 04/08/2020 16        ASSESSMENT:      ICD-10-CM    1. Lichen sclerosus  L90.0        PLAN:      1. Lichen sclerosus  - Continue clobetasol 3x/week. Perineum improved in symptoms and appearance. We discussed the proper location to apply the medication (perineum, bilateral labia minora, and clitoris) as she does not have LS in the inguinal creases. She thought she should apply it to the inguinal crease area because that area felt dry. I recommended lotion for that area instead.     RTC in 6-12 months.

## 2020-08-12 ENCOUNTER — Ambulatory Visit (INDEPENDENT_AMBULATORY_CARE_PROVIDER_SITE_OTHER): Payer: Medicare HMO | Admitting: Rheumatology

## 2020-08-12 ENCOUNTER — Ambulatory Visit: Payer: Medicare HMO | Attending: Rheumatology

## 2020-08-12 VITALS — BP 122/80

## 2020-08-12 DIAGNOSIS — M170 Bilateral primary osteoarthritis of knee: Secondary | ICD-10-CM

## 2020-08-12 DIAGNOSIS — M0579 Rheumatoid arthritis with rheumatoid factor of multiple sites without organ or systems involvement: Secondary | ICD-10-CM

## 2020-08-12 DIAGNOSIS — Z79899 Other long term (current) drug therapy: Secondary | ICD-10-CM | POA: Insufficient documentation

## 2020-08-12 LAB — COMPREHENSIVE METABOLIC PANEL
ALT (GPT): 13 U/L (ref 7–33)
AST (GOT): 14 U/L (ref 9–38)
Albumin: 4.1 g/dL (ref 3.5–5.2)
Alkaline Phosphatase (Total): 69 U/L (ref 38–172)
Anion Gap: 8 (ref 4–12)
Bilirubin (Total): 0.6 mg/dL (ref 0.2–1.3)
Calcium: 9.2 mg/dL (ref 8.9–10.2)
Carbon Dioxide, Total: 28 meq/L (ref 22–32)
Chloride: 104 meq/L (ref 98–108)
Creatinine: 0.97 mg/dL (ref 0.38–1.02)
Glucose: 97 mg/dL (ref 62–125)
Potassium: 4 meq/L (ref 3.6–5.2)
Protein (Total): 6.5 g/dL (ref 6.0–8.2)
Sodium: 140 meq/L (ref 135–145)
Urea Nitrogen: 23 mg/dL — ABNORMAL HIGH (ref 8–21)
eGFR by CKD-EPI 2021: >60 mL/min/{1.73_m2} (ref >59)

## 2020-08-12 LAB — CBC, DIFF
% Basophils: 1 %
% Eosinophils: 2 %
% Immature Granulocytes: 1 %
% Lymphocytes: 17 %
% Monocytes: 6 %
% Neutrophils: 73 %
% Nucleated RBC: 0 %
Absolute Eosinophil Count: 0.21 10*3/uL (ref 0.00–0.50)
Absolute Lymphocyte Count: 1.95 10*3/uL (ref 1.00–4.80)
Basophils: 0.06 10*3/uL (ref 0.00–0.20)
Hematocrit: 44 % (ref 36.0–45.0)
Hemoglobin: 14.1 g/dL (ref 11.5–15.5)
Immature Granulocytes: 0.06 10*3/uL — ABNORMAL HIGH (ref 0.00–0.05)
MCH: 29.3 pg (ref 27.3–33.6)
MCHC: 32 g/dL — ABNORMAL LOW (ref 32.2–36.5)
MCV: 92 fL (ref 81–98)
Monocytes: 0.63 10*3/uL (ref 0.00–0.80)
Neutrophils: 8.58 10*3/uL — ABNORMAL HIGH (ref 1.80–7.00)
Nucleated RBC: 0 10*3/uL
Platelet Count: 298 10*3/uL (ref 150–400)
RBC: 4.82 10*6/uL (ref 3.80–5.00)
RDW-CV: 15.5 % — ABNORMAL HIGH (ref 11.6–14.4)
WBC: 11.49 10*3/uL — ABNORMAL HIGH (ref 4.3–10.0)

## 2020-08-12 LAB — SED RATE: Erythrocyte Sedimentation Rate: 17 mm/h (ref 0–20)

## 2020-08-12 LAB — C_REACTIVE PROTEIN: C_Reactive Protein: 12.9 mg/L — ABNORMAL HIGH (ref 0.0–10.0)

## 2020-08-12 NOTE — Patient Instructions (Signed)
It was a pleasure seeing you in clinic today.    Continue your current medications  Get labs after your appointment today  Follow up in 4 months      If you have any questions or concerns before your next appointment, please do not hesitate to contact me. Our clinic phone number is [phone] and I am also available via eCare.

## 2020-08-12 NOTE — Progress Notes (Signed)
[place]  [id number]  TEL: [phone]  l  FAX: [phone]    Follow Up Clinic Note  08/12/2020    IDENTIFYING DATA/CC:    Caitlin Wiley is a very pleasant 70 year old female who presents for follow up of rheumatoid arthritis, osteoarthritis.    RHEUMATOLOGIC HISTORY OF PRESENT ILLNESS:  Diagnosis:RA, OA; initial contult 11/30/2017  Presenting Symptoms/Timeline:2013 joint pain hands, wrists, knees, shoulders  Autoimmune Serologies:RF 140, CCP 16; CRP 17, ESR 23; UA 4.6  Pertinent Radiology:Xrays of hands, wrists, knees, feet: No erosions, consistent with OA, chondrocalcinosis  Medications Used:MXT 11/2017  Significant Flares:  Immunization Status: Influenza, PCV13, PPSV23, Zoster  Infectious Disease Screening: HepatitisB NR,Hepatitis C, TB quantiferon NR  Other: Afib on chronic anticoag,    INTERVAL HISTORY:  Patient is here for follow-up of rheumatoid arthritis, osteoarthritis.  She was last seen 4 months ago at which time she had stable symptoms.  Patient is on methotrexate monotherapy for treatment of her inflammatory arthritis.  At last visit she did have slight elevation in CRP which we are monitoring.  She has also had significant weight gain making knee osteoarthritis quite problematic for her.    Patient reports stable disease.  She states about a month ago she had some swelling in the left [facility] joint.  This was also around the time that she did not feel well with increased malaise, fatigue as well as lymphadenopathy involving the right side of her neck.  She also had diarrhea.  She states that her Alcario Drought had similar symptoms.  Her symptoms resolved without intervention.  She did test for COVID twice which was negative.  She had no fevers or URI symptoms.  Joints are otherwise stable.  She has 5 minutes of morning stiffness.  She continues to have problems with knee and wrist.  Pain is exacerbated with activity.  Despite this she finds that  doing physical therapy exercises are helpful for pain.    REVIEW OF SYSTEMS:  Complete ROS is negative, except for those mentioned above in the Interval History    PROBLEM LIST:  Patient Active Problem List    Diagnosis Date Noted   . Lichen sclerosus [K09.3] 04/30/2020   . Postmenopausal bleeding [N95.0] 01/10/2020   . Cervical polyp [N84.1] 01/10/2020   . Vulvar lesion [N90.89] 01/10/2020   . Rheumatoid arthritis involving multiple sites with positive rheumatoid factor [M05.79] 02/08/2018   . Primary osteoarthritis of both knees [M17.0] 09/12/2017   . Chronic anticoagulation [Z79.01] 08/02/2015   . Right cervical radiculopathy [M54.12] 05/30/2015   . Morbid obesity with BMI of 40.0-44.9, adult ([address]) [E66.01, Z68.41] 05/30/2015   . Paroxysmal atrial fibrillation ([place]) [I48.0] 04/17/2014     A. S/p ablation ([facility]) 10/19/2016     . GERD (gastroesophageal reflux disease) [K21.9] 11/08/2012   . OA (osteoarthritis) [M19.90] 07/28/2012   . Asthma [J45.909] 07/24/2012   . Edema [R60.9] 07/24/2012   . Sleep apnea [G47.30] 07/24/2012   . Pulmonary nodule [R91.1] 07/24/2012   . Grief reaction [F43.21] 07/24/2012     05/11/2011         SOCIAL/FAMILY HISTORY:  Reviewed with patient. No changes from what is currently noted in EPIC    ALLERGIES:  Review of patient's allergies indicates:  Allergies   Allergen Reactions   . Bee Venom Anaphylaxis   . Ciprofloxacin Other     Joint aching   . Pneumococcal Vaccines Skin:  Hives   . Sulfa Antibiotics Skin: Hives   . Cats [Animals] Skin: Itching   . Dust Mite Extract Skin: Itching     DUST   . Pollen Extract Skin: Itching       MEDICATIONS:  Current Outpatient Medications   Medication Sig Dispense Refill   . acetaminophen 325 MG tablet Take 325 mg by mouth every 6 hours as needed.     [name] albuterol HFA 108 (90 Base) MCG/ACT inhaler INHALE ONE TO TWO PUFFS BY MOUTH EVERY 6 HOURS AS NEEDED FOR SHORTNESS OF BREATH, WHEEZING 18 g 3   . apixaban (Eliquis) 5 MG tablet Take 1 tablet (5 mg) by  mouth 2 times a day. 180 tablet 3   . Cholecalciferol (VITAMIN D3 OR) Take by mouth.     . clobetasol 0.05 % ointment Apply topically 3 times a week. Apply to vulvar area. 60 g 1   . famotidine 20 MG tablet Take 1 tablet (20 mg) by mouth 2 times a day. 180 tablet 3   . Flaxseed, Linseed, (FLAXSEED OIL) 1200 MG Oral Cap None Entered     . folic acid 1 MG tablet Take 3 tablets (3 mg) by mouth daily. 270 tablet 3   . GLUCOSAMINE CHONDROITIN COMPLX OR      . Magnesium 400 MG Oral Cap Take 400 mg by mouth.     . methotrexate 2.5 MG tablet TAKE 8 TABLETS BY MOUTH EVERY 7 DAYS 96 tablet 3   . miSOPROStol 200 MCG tablet Take 2 tablets (400 mcg) by mouth as needed (Take the night before procedure). 2 tablet 0   . montelukast 10 MG tablet Take 1 tablet (10 mg) by mouth daily. 90 tablet 3   . Multiple Vitamins-Minerals (MULTIVITAMIN OR) no iron     . Probiotic Product (PROBIOTIC DAILY OR)      . SIMETHICONE-80 OR Take 2-3 tablets by mouth daily as needed.     . triamterene-hydroCHLOROthiazide 37.5-25 MG tablet Take 1 tablet by mouth daily. 90 tablet 3   . Unclassified (OTHER MEDS, SEE COMMENTS,) Curamin oral tab daily       No current facility-administered medications for this visit.       PHYSICAL EXAMINATION:  Vital signs:  BP 122/80   LMP  (LMP Unknown)   SpO2 95%   General:  Awake, alert, and oriented, no apparent distress, pleasant, and cooperative  Psychologic:  Mood is euthymic, affect is congruent  EYES: Anicteric sclera, no conjunctival injection  ENT:  Normocephalic, atraumatic, moist membranes, no oral ulcers   Pulmonary:  Non-labored breathing, no wheezes, rhonchi, or rales  Cardiovascular:  RRR, no murmurs, rubs or gallops  Skin:  Normal temperature and texture, No visible rashes, ulcers, or nodules  Neurologic:  Light touch sensation is grossly intact. Face symmetric   Musculoskeletal:     Gait: Normal   Joints: No evidence of synovitis, joint swelling.  Tenderness to palpation of the bilateral CMC  joint.   Nails: No pitting, clubbing, or cyanosis noted in the fingernails bilaterally    Muscle strength: Strength is grossly 5 out of 5 throughout the bilateral upper and lower extremities    LABS:    Pending    RADIOLOGY:   None    DISEASE ACTIVITY:  Patient Function: 3  Patient Pain Assessment: 3.5  Patient Global Assessment: 2   Combined Rapid 3 Score: 8.5    ASSESSMENT AND PLAN:  Thu Baggett is a very pleasant 71 year old  female who presents for follow up of rheumatoid arthritis.    #.Rheumatoid arthritis.Seropositive, nonerosive RA.Doingmuch better since MTX.No evidence of synovitis on exam today.Knee pain, CMC joint pain sounds mechanical in nature and likely related to osteoarthritis. We discussed therapeutic options including corticosteroid or hyaluronic acid derivative injectionin the future.She is a candidate for total knee replacement surgery once she is able to reach her target BMI.     Stable disease including her osteoarthritis.  We will repeat inflammatory labs today as previous CRP was 10.1 (upper limit normal 10).  She is tolerating methotrexate without significant side effects including no significant infectious complications.    -continuemethotrexate 41m once a week  -continuefolic aNVBT6OMdaily    #. High risk medication. MTX toxicitylabs today.    #. Osteoarthritis. Most bothersome in the kneesand is activity related.She is interested in TKR but will need to lose some weight first.  She has gained weight this winter.  I recommend that she follow-up with her PCP to discuss how her mood condition may be affecting her health such as lack of desire to exercise, dietary intake.  She could also discuss with her PCP about a referral to weight loss clinic.    #. Immunization. She is due for tetanus and shingles vaccine.  She is also due for her COVID-vaccine booster.    #. Bone health.Last DEXA from 2017 was normal.DEXA 06/2018 normal.    -optimization of  calcium and vitamin D  - daily weight bearing exercises      All the patient's questions were answered. The patient will follow up in 4 months.    Dictated using sChief of Staffand electronically signed by YRoxy Horseman MD   Note: This document may contain errors inherent to this technology. If you find any errors that might affect patient care, please contact our office as soon as possible.   [address], [place]. [phone]. [phone]

## 2020-08-13 NOTE — Result Encounter Note (Signed)
Labs stable

## 2020-09-09 ENCOUNTER — Encounter (INDEPENDENT_AMBULATORY_CARE_PROVIDER_SITE_OTHER): Payer: Self-pay | Admitting: Family Medicine

## 2020-09-11 ENCOUNTER — Encounter (INDEPENDENT_AMBULATORY_CARE_PROVIDER_SITE_OTHER): Payer: Self-pay

## 2020-09-25 ENCOUNTER — Telehealth (INDEPENDENT_AMBULATORY_CARE_PROVIDER_SITE_OTHER): Payer: Self-pay | Admitting: Family Medicine

## 2020-09-25 ENCOUNTER — Other Ambulatory Visit (INDEPENDENT_AMBULATORY_CARE_PROVIDER_SITE_OTHER): Payer: Self-pay | Admitting: Rheumatology

## 2020-09-25 DIAGNOSIS — M0579 Rheumatoid arthritis with rheumatoid factor of multiple sites without organ or systems involvement: Secondary | ICD-10-CM

## 2020-09-25 IMAGING — CT CT LUMBAR SPINE WITHOUT CONTRAST
3 of 4 series · 10 of 33 positions shown, 12 images · non-contrast
Comparison: None

________________________________________________________________________________________________ 
CT LUMBAR SPINE WITHOUT CONTRAST, 09/25/2020 [DATE]: 
CLINICAL INDICATION: History of breast cancer. Lower lumbar back pain and hip 
pain. Lumbar radiculopathy. 
A search for DICOM formatted images was conducted for prior CT imaging studies 
completed at a non-affiliated media free facility.
TECHNIQUE: The lumbar spine was scanned from T12 through mid-sacrum without 
contrast on a high-resolution CT scanner using dose reduction techniques. 
Routing MPR reconstructions were performed.

[Series 3: axial · axial · 0.28mm/px · z∈[-463,-381]mm · 2 of 123 slices shown, 3 images]
[im 41/123  soft-tissue]
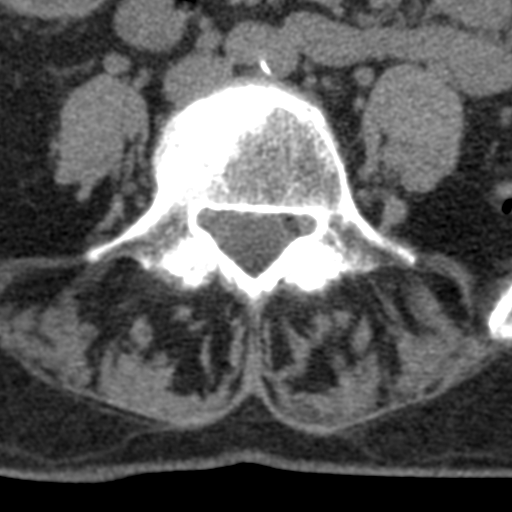
[im 41/123  bone]
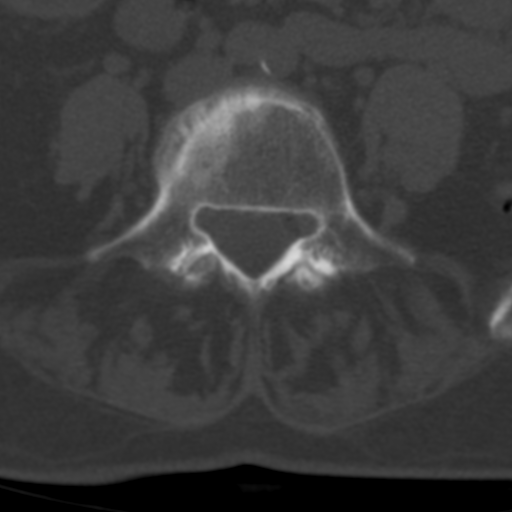
[im 82/123  bone]
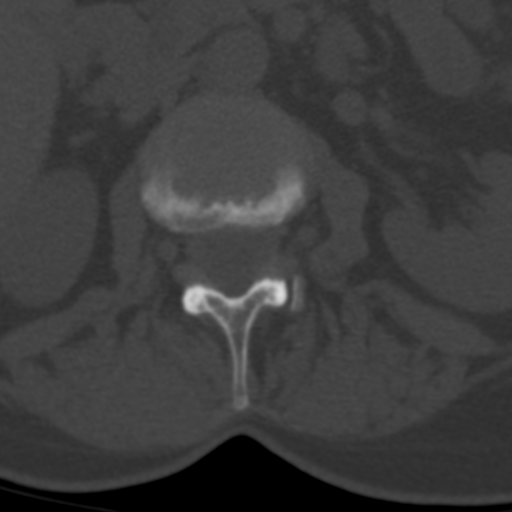

[Series 6: cor · coronal · 0.35mm/px · 3 of 62 slices shown]
[im 13/62  bone]
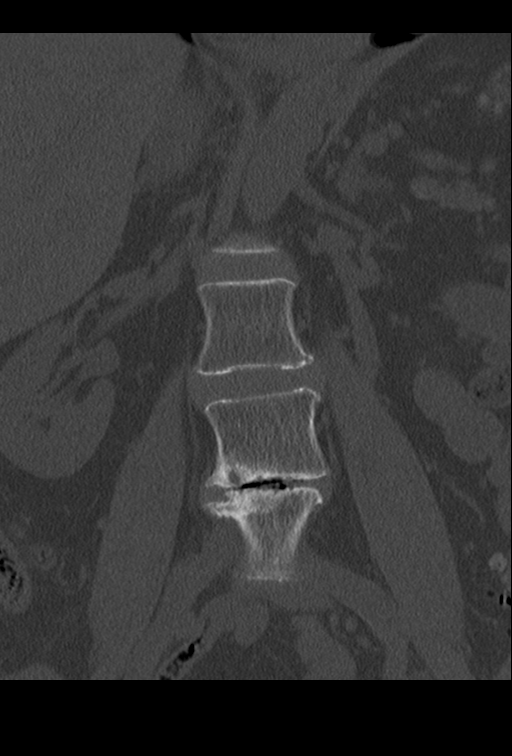
[im 25/62  bone]
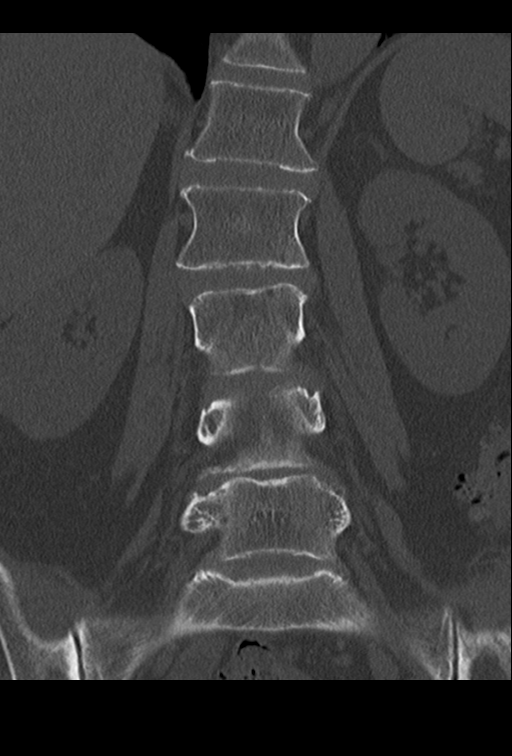
[im 37/62  bone]
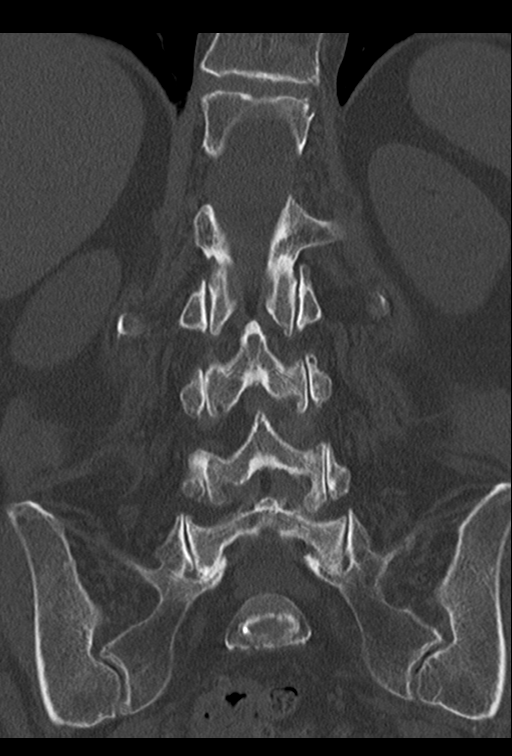

[Series 8: sag st · sagittal · 0.28mm/px · 5 of 77 slices shown, 6 images]
[im 26/77  bone]
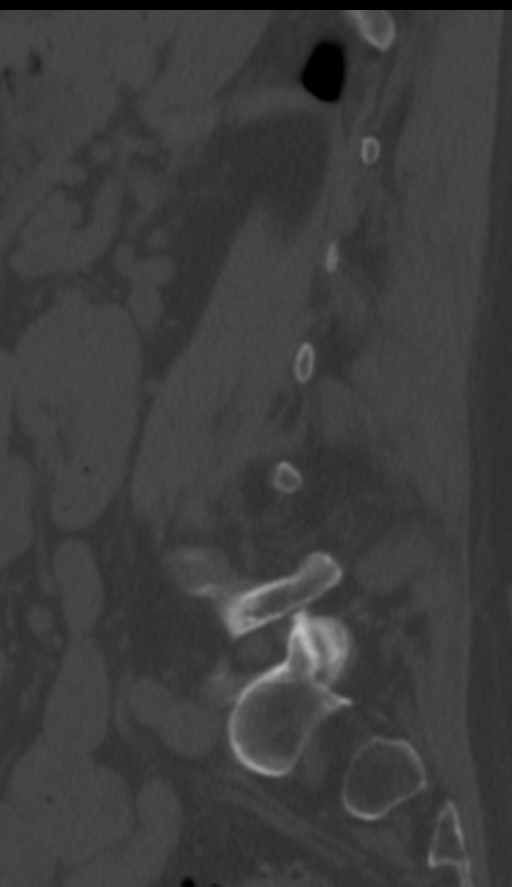
[im 32/77  bone]
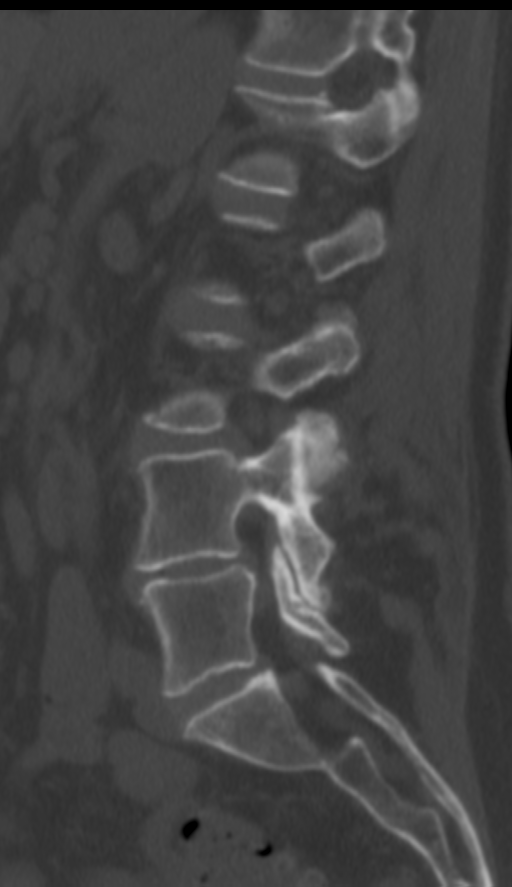
[im 39/77  soft-tissue]
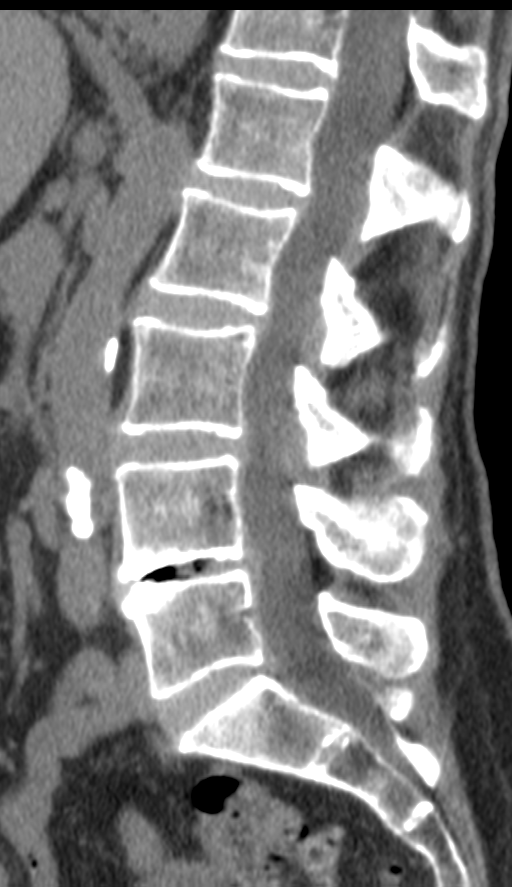
[im 39/77  bone]
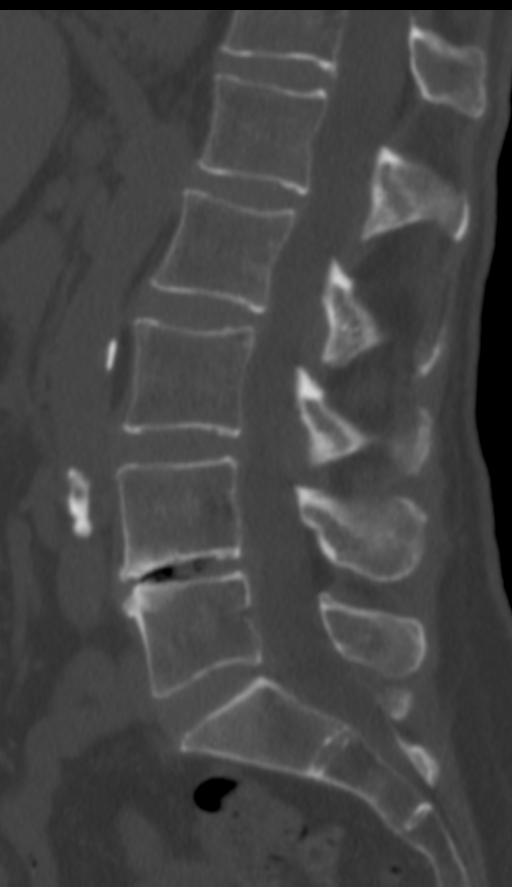
[im 45/77  bone]
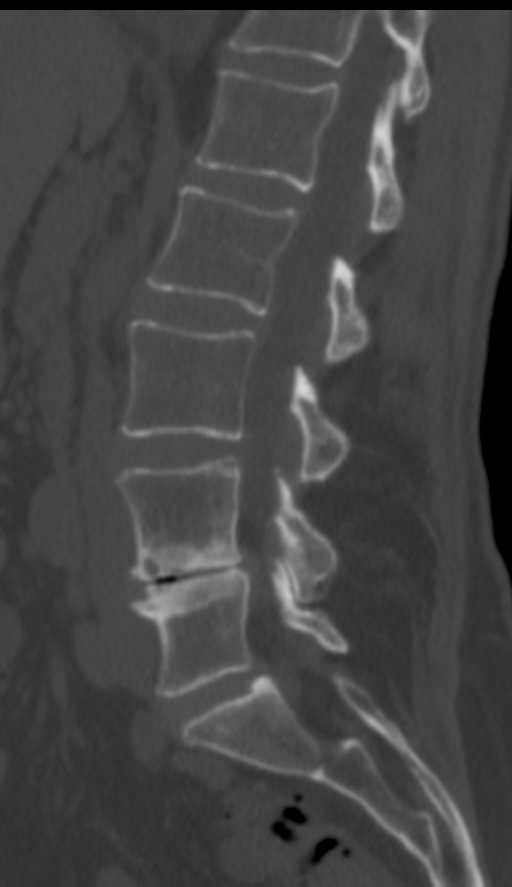
[im 51/77  bone]
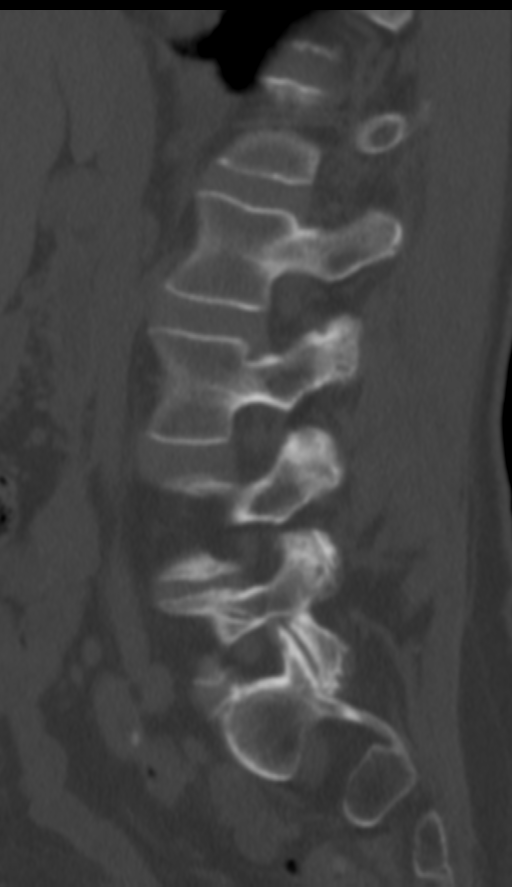

[10 of 33 positions shown; findings below may reference images not displayed]

FINDINGS: Mild dextroconvex thoracolumbar scoliosis, apex to the right at L2-L3 
level. This measures 12.6 degrees, as measured from the superior endplate L1 
through the superior endplate of L4. Slight loss of the normal lumbar lordosis 
with 1-2 mm retrolisthesis L1 on L2 and L2 on L3 with otherwise anatomic 
sagittal alignment. Vertebral body height preserved. No pars defect. No 
fracture. No lytic or sclerotic osseous lesion. Visualized sacroiliac joints 
demonstrate mild degenerative-type sclerosis, greater on the right. Moderate 
sliding hiatal hernia. Colonic diverticulosis. 
Disc levels are as follows given the intrinsic resolution of the CT exam: 
T12-L1: The disc is normal in height. No discrete disc herniation. Normal 
facets. No spinal canal or neural foraminal stenosis.  
L1-L2: Mild loss of disc height, specifically to the left. 
Patent central canal and foramina are patent. Normal facets.  
L2-L3: Normal disc height. Minimal generalized annular bulge. Central canal and 
foramina are. Normal facets.  
L3-L4: Minimal loss of disc height. Minimal annular bulge. Mild canal stenosis. 
Foramina are patent. Small left foraminal disc protrusion abuts the exiting left 
L3 nerve root but without nerve root distortion. Normal facets. 
L4-L5: Severe loss of disc height specifically to the right with endplate 
sclerosis, vacuum disc phenomenon, and anterior and right lateral marginal 
osteophytes. Bilateral moderate facet arthropathy. Osteophytic spurring 
contributes to mild right foraminal narrowing. Left neural foramen is patent.  
L5-S1: Normal disc height. Minimal annular bulge. Central canal is patent. 
Foramina patent. Mild facet arthropathy.
IMPRESSION: No findings to indicate osseous metastatic disease. No acute osseous 
abnormality. 
Mild dextroconvex thoracolumbar scoliosis with significant degenerative changes 
L4-L5 level, detailed above, with mild right foraminal narrowing. 
L3-L4, small left foraminal disc protrusion abuts the exiting left L3 nerve root 
but without definite nerve root distortion. 
Moderate sliding hiatal hernia.  
RADIATION DOSE REDUCTION: All CT scans are performed using radiation dose 
reduction techniques, when applicable.  Technical factors are evaluated and 
adjusted to ensure appropriate moderation of exposure.  Automated dose 
management technology is applied to adjust the radiation doses to minimize 
exposure while achieving diagnostic quality images.

## 2020-09-25 NOTE — Telephone Encounter (Signed)
Outreach: Phone Call to Patient  AWV Due: 2022 [id number]    Care Gaps due: Annual Wellness Visit    Attempted to reach patient by phone. Spoke with patient. Explained reason for call. Discussed patient seeing PCP to address care gaps due. Helped patient schedule appointment on 11-4.    Closing encounter

## 2020-09-26 MED ORDER — FOLIC ACID 1 MG OR TABS
3.0000 mg | ORAL_TABLET | Freq: Every day | ORAL | 3 refills | Status: DC
Start: 2020-09-26 — End: 2021-10-21

## 2020-09-26 MED ORDER — METHOTREXATE SODIUM 2.5 MG OR TABS
ORAL_TABLET | ORAL | 3 refills | Status: DC
Start: 2020-09-26 — End: 2021-10-21

## 2020-09-26 NOTE — Telephone Encounter (Signed)
Routing to provider for review and approval.  Methotrexate 2.5 mg tablet  Folic acid 1 mg tablet  Last Labs: 08/12/2020  Last Office visit: 08/12/2020  Next Appointment: 12/16/2020

## 2020-09-26 NOTE — Telephone Encounter (Signed)
Sent today in another encounter

## 2020-10-03 ENCOUNTER — Ambulatory Visit (INDEPENDENT_AMBULATORY_CARE_PROVIDER_SITE_OTHER): Payer: Self-pay

## 2020-10-03 NOTE — Telephone Encounter (Signed)
Call Type:  Triage Call    [place] Name:  [place] Name:  Graham [id number]    Presenting Problem:  Pain-Severe Abdominal - Pt has discomfort, bad gas and pain abdominal area    Assessment:  01. Symptoms: What are your symptoms? generalized abdominal pain, bloating, loss of appetite  02. Onset: When did your symptoms/concern begin? (minutes, hours, days, or months ago?) 12 days  03. Action: What actions or treatments have you taken to treat your symptoms/concern? BRAT diet, drinking liquids  04. Response: What was the response to your actions taken? no improvment  05. Severity: How severe are your symptoms? (ex. interference with normal activities, pain scale, size or appearance of injury) MILD generalized upper abdominal pain that comes and goes and present > 48 hrs  06. Additional/Further RN Assessment: (ex. weight, I/O, etc.) Hx of diverticulitis    Guideline Title:  Abdominal Pain - Female (Adult)    Guideline Question:  [1] MILD pain (e.g., does not interfere with normal activities) AND [2] pain comes and goes (cramps) AND [3] present > 48 hours  (Exception: this same abdominal pain is a chronic symptom recurrent or ongoing AND present > 4 weeks)? Yes    Recommended Disposition:  See PCP or Video Visit Within 24 Hours    Original Inclination:  NA-Urgent Call    Intended Action:  See PCP or Video Visit Within 24 Hours    Care Advice:  Care Advice given per Abdominal Pain, Female (Adult) guideline.  Diet:  * Drink adequate fluids. Eat a bland diet.  * Avoid greasy or fatty foods.  Call Back If:  * You become worse  See PCP Within 24 Hours:

## 2020-10-06 ENCOUNTER — Telehealth (INDEPENDENT_AMBULATORY_CARE_PROVIDER_SITE_OTHER): Payer: Medicare HMO | Admitting: Internal Medicine

## 2020-10-06 DIAGNOSIS — R109 Unspecified abdominal pain: Secondary | ICD-10-CM

## 2020-10-06 NOTE — Progress Notes (Signed)
HPI  Distant Site Telemedicine Encounter    I conducted this encounter from On-site location (clinic, hospital, on-site office) via secure, live, face-to-face video conference with the patient. Caitlin Wiley was located at home alone  . I reviewed the risks and benefits of telemedicine as pertinent to this visit and the patient agreed to proceed.    Pt is here to discuss     Pt had g.i. discomfort on 9/4   She thought it was a flare of diverticulitis   She started liquid diet   This did not help   She started belching and gas pains   Then over the weekend she started feeling better   She continue to do better         Review of Systems   Constitutional: Negative for fever.     There were no vitals filed for this visit.      Physical Exam  Alert, not in distress    Assessment and plan     (R10.9) Abdominal pain, unspecified abdominal location  (primary encounter diagnosis)  Plan: s/s have improved   She has history of/o diverticulitis   D/d includes flare of that , vs gastroenteritis   rec continuing fluids

## 2020-10-20 ENCOUNTER — Ambulatory Visit (INDEPENDENT_AMBULATORY_CARE_PROVIDER_SITE_OTHER): Payer: Medicare HMO

## 2020-10-20 VITALS — Temp 97.7°F

## 2020-10-20 DIAGNOSIS — Z23 Encounter for immunization: Secondary | ICD-10-CM

## 2020-10-20 MED ORDER — INFLUENZA VAC A&B SA ADJ QUAD 0.5 ML IM PRSY
0.5000 mL | PREFILLED_SYRINGE | Freq: Once | INTRAMUSCULAR | Status: AC
Start: 2020-10-20 — End: 2020-10-20
  Administered 2020-10-20: 0.5 mL via INTRAMUSCULAR

## 2020-10-20 MED ORDER — COVID-19MRNA BIVAL VAC MODERNA 50 MCG/0.5ML IM SUSP
50.0000 ug | Freq: Once | INTRAMUSCULAR | Status: AC
Start: 2020-10-20 — End: 2020-10-20
  Administered 2020-10-20: 50 ug via INTRAMUSCULAR

## 2020-10-20 NOTE — Progress Notes (Signed)
COVID-19 Vaccine Intake Documentation      Pre-Vaccination Screening Questions:         1.  Are you feeling sick today?       NO       2. Have you ever received a dose of COVID-19 vaccine?     YES         If yes, which vaccine product?   Moderna         3.  Have you  ever had a severe allergic reaction    (e.g., anaphylaxis) to something?  For example, a reaction for    which you were treated with epinephrine or Epi Pen  or    for which you had to go to the hospital?     If yes, please review questions below:       NO        Was the severe allergic reaction after receiving a COVID-19 vaccine?      NO      Was the severe allergic reaction after receiving another vaccine or another injectable medication?     NO      4.  Does the patient weigh 66 pounds or less?   No   5. For pediatric patients, has your child ever been diagnosed with MIS-C (multisystem inflammatory disease) related to Covid-19?   No       If so, have they recovered completely AND has it been more than 90 days since their diagnosis?   No   6. Does the patient attest they meet CDC eligibility criteria for this dose?   Yes     Medication Administrations This Visit       COVID-19 mRNA bivalent BOOSTER vaccine (Moderna) injection 50 mcg Admin Date  10/20/2020  16:04 Action  Given Dose  50 mcg Route  Intramuscular Site  Left Deltoid Administered By  Erich Montane, CMA    Ordering Provider: Erich Montane, CMA    NDC: 70263-785-88    Lot#: [id number]      influenza quadrivalent adjuvanted vaccine (Fluad) injection 0.5 mL Admin Date  10/20/2020  16:05 Action  Given Dose  0.5 mL Route  Intramuscular Site  Left Deltoid Administered By  Erich Montane, CMA    Ordering Provider: Erich Montane, [place]    [place]: 28786-767-20    Lot#: [id number]        Vaccine given today without initial adverse effect. Dewaine Conger, CMA  [address] [place], [place] [address]  Phone: [phone]

## 2020-10-21 ENCOUNTER — Other Ambulatory Visit (INDEPENDENT_AMBULATORY_CARE_PROVIDER_SITE_OTHER): Payer: Self-pay | Admitting: Family Medicine

## 2020-11-04 ENCOUNTER — Encounter (INDEPENDENT_AMBULATORY_CARE_PROVIDER_SITE_OTHER): Payer: Self-pay

## 2020-11-20 DIAGNOSIS — D849 Immunodeficiency, unspecified: Secondary | ICD-10-CM | POA: Insufficient documentation

## 2020-11-21 ENCOUNTER — Ambulatory Visit (INDEPENDENT_AMBULATORY_CARE_PROVIDER_SITE_OTHER): Payer: Medicare HMO | Admitting: Family Medicine

## 2020-11-21 VITALS — BP 116/71 | HR 76 | Temp 98.2°F | Resp 16 | Ht 64.96 in | Wt 287.0 lb

## 2020-11-21 DIAGNOSIS — J4520 Mild intermittent asthma, uncomplicated: Secondary | ICD-10-CM

## 2020-11-21 DIAGNOSIS — M0579 Rheumatoid arthritis with rheumatoid factor of multiple sites without organ or systems involvement: Secondary | ICD-10-CM

## 2020-11-21 DIAGNOSIS — I480 Paroxysmal atrial fibrillation: Secondary | ICD-10-CM

## 2020-11-21 DIAGNOSIS — D849 Immunodeficiency, unspecified: Secondary | ICD-10-CM

## 2020-11-21 DIAGNOSIS — Z0000 Encounter for general adult medical examination without abnormal findings: Secondary | ICD-10-CM

## 2020-11-21 DIAGNOSIS — K219 Gastro-esophageal reflux disease without esophagitis: Secondary | ICD-10-CM

## 2020-11-21 DIAGNOSIS — R600 Localized edema: Secondary | ICD-10-CM

## 2020-11-21 LAB — LIPID PANEL
Cholesterol/HDL Ratio: 2.8
HDL Cholesterol: 64 mg/dL (ref >39)
LDL Cholesterol, NIH Equation: 101 mg/dL (ref <130)
Non-HDL Cholesterol: 116 mg/dL (ref 0–159)
Total Cholesterol: 180 mg/dL (ref <200)
Triglyceride: 80 mg/dL (ref <150)

## 2020-11-21 MED ORDER — TRIAMTERENE-HCTZ 37.5-25 MG OR TABS
1.0000 | ORAL_TABLET | Freq: Every day | ORAL | 3 refills | Status: DC
Start: 2020-11-21 — End: 2022-01-04

## 2020-11-21 MED ORDER — FAMOTIDINE 20 MG OR TABS
20.0000 mg | ORAL_TABLET | Freq: Two times a day (BID) | ORAL | 3 refills | Status: DC
Start: 2020-11-21 — End: 2022-01-04

## 2020-11-21 MED ORDER — MONTELUKAST SODIUM 10 MG OR TABS
10.0000 mg | ORAL_TABLET | Freq: Every day | ORAL | 3 refills | Status: DC
Start: 2020-11-21 — End: 2022-01-04

## 2020-11-21 MED ORDER — APIXABAN 5 MG OR TABS
5.0000 mg | ORAL_TABLET | Freq: Two times a day (BID) | ORAL | 3 refills | Status: DC
Start: 2020-11-21 — End: 2022-01-04

## 2020-11-21 NOTE — Patient Instructions (Addendum)
You may be a candidate for the following screening and prevention measures:    Health Maintenance   Topic Date Due    Hepatitis B Vaccine (1 of 3 - 3-dose series) Never done    Zoster Vaccine (1 of 2) Never done    DTaP, Tdap, and Td Vaccines (3 - Td or Tdap) 03/13/2020    Osteoporosis Screening  06/19/2020    Medicare Annual Wellness Visit  11/21/2020    Colorectal Cancer Screening  04/02/2021    Depression Screening (PHQ-2)  11/21/2021    Lipid Disorders Screening  08/24/2022    Breast Cancer Screening  09/07/2022    Diabetes Screening  08/13/2023    Pneumococcal Vaccine: 65+ years  Completed    Influenza Vaccine  Completed    Hepatitis C Screening  Completed    COVID-19 Vaccine  Completed    Hepatitis A Vaccine  Aged Out       It is very important to maintain a healthy lifestyle.  This begins with eating a balanced diet that includes daily servings of fruit, vegetables and whole grains.  It is much healthier to prepare your own meals.  Try to avoid fast food and sugared drinks.  Maintaining an active lifestyle is also important.  This includes 3-4 days of an exercise that increases your heart rate (cardio) for at least 20-30 minutes.  Stretching exercises such as yoga and working with weights can also have great benefit.     Specific health issues that are important for you to manage are:    Atrial fibrillation  Rheumatoid arthritis

## 2020-11-21 NOTE — Progress Notes (Signed)
ANNUAL WELLNESS VISIT    Caitlin Wiley is a 70 year old female who presents for an Annual Wellness Visit.   []  Initial   [x]  Subsequent        INFORMATION GATHERING:   The following areas were confirmed with patient/caregiver and/or updated in Epic at this visit:       Past Medical History   Past Surgical History   Family History   Social History    Current medications and supplements (including vitamins and calcium)   Allergies  [x]  All of the above components have been reviewed and updated.       The information below is up to date at the end of this visit:   Review of functional ability, hearing, fall risk and safety, diet, physical activity, and health habits  (via HRA or direct review with patient or caregiver)     The Health Risk Assessment (HRA) for today's visit was completed:    [x]  as Patient-Entered Questionnaire via eCare     []  as paper HRA form, completed by or with the patient or caregiver      []  in HRA template documentation, completed by staff or provider with patient or caregiver at visit        List of current providers and suppliers:     []   See Care Team Section   [x]   See EHR Encounters for [place] Providers involved in care   []   Other providers and suppliers outside [facility]:     Depression screening:    PHQ-2: 0  PHQ-9 (if done):       EXAM:  BP 116/71    Pulse 76    Temp 36.8 C (Temporal)    Resp 16    Ht 5' 4.96" (1.65 m)    Wt (!) 130.2 kg (287 lb)    LMP  (LMP Unknown)    SpO2 96%    BMI 47.82 kg/m         General:  alert, cooperative, no acute distress  Lung:  clear to auscultation bilaterally  Heart:  regular rate and rythmn, no murmurs  GAIT:    []  Normal, stable, independent    [x]  Abnormal (describe):    As assessed by:     []  Direct Observation     []  Timed Up and Go     [x]  Other: using walker  COGNITION:    [x]  Intact     []  Abnormal (describe):     As assessed by:      [x]  Direct Observation     []  Brief Cognitive Screen          ADVANCE CARE PLANNING  (ACP)     Advance care planning:   []  Patient Accepted   [x]  Patient Declined     Check all that apply:   []  Advance directives are on file in her chart   []  Explained & discussed advance directives at this visit   []  Completed advance care planning form(s) at this visit   []  Other Advance Care Planning discussion at this visit (describe):       Time Spent on ACP: []  None       []  1-15 minutes      []  16-30 minutes      []  >30 minutes      ASSESSMENT:       Caitlin Wiley was seen for her Annual Wellness Visit, including identification of risk factors & conditions that may  affect her health and function in the future.     (Z00.00) Encounter for Medicare annual wellness exam  (primary encounter diagnosis)  Plan:     (I48.0) Paroxysmal atrial fibrillation ([place])  Plan: apixaban (Eliquis) 5 MG tablet, Lipid Panel        CBC stable in July.    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  Plan: continues follow-up with rheumatology    (R60.0) Edema of both legs  Plan: triamterene-hydroCHLOROthiazide 37.5-25 MG         tablet        CBC normal in July             Actions at this visit:         Establishing or updating a written schedule of screening and prevention measures recommended and appropriate for Caitlin Wiley for the next 5-10 years   Establishing or updating a list of her risk factors and conditions for which lifestyle or medical interventions are recommended or underway, including mental health risks and conditions, and including risks/benefits of treatment   Furnishing personalized health advice and, as appropriate, referrals to health education or preventive counseling services or programs (such as fall prevention,  tobacco cessation, physical activity, nutrition, weight loss)    [x]  All of the above components have been reviewed and updated.     COUNSELING:      It is very important to maintain a healthy lifestyle.  This begins with eating a balanced diet that includes daily servings of fruit, vegetables and whole grains.  It is much healthier to prepare your own meals.  Try to avoid fast food and sugared drinks.  Maintaining an active lifestyle is also important.  This includes 3-4 days of an exercise that increases your heart rate (cardio) for at least 20-30 minutes.  Examples of cardiovascular exercise are walking, jogging, swimming, biking.  Stretching exercises such as yoga and working with weights can also have great benefit.     Specific health issues that are important for you to manage are:    1. Atrial fibrillation  2. Rheumatoid arthritis    Here are screening & prevention measures recommended for you:  Health Maintenance   Topic Date Due    Hepatitis B Vaccine (1 of 3 - 3-dose series) Never done    Zoster Vaccine (1 of 2) Never done    DTaP, Tdap, and Td Vaccines (3 - Td or Tdap) 03/13/2020    Osteoporosis Screening  06/19/2020    Medicare Annual Wellness Visit  11/21/2020    Colorectal Cancer Screening  04/02/2021    Depression Screening (PHQ-2)  11/21/2021    Lipid Disorders Screening  08/24/2022    Breast Cancer Screening  09/07/2022    Diabetes Screening  08/13/2023    Pneumococcal Vaccine: 65+ years  Completed    Influenza Vaccine  Completed    Hepatitis C Screening  Completed    COVID-19 Vaccine  Completed    Hepatitis A Vaccine  Aged Out       Please plan to have a Subsequent Annual Wellness Visit in 1 year.

## 2020-11-21 NOTE — Progress Notes (Signed)
Visit: pt is here for a wellness. Pt wanted to ask about reflux medication . Pt wanted ask rechecking spot on her lungs.   Refills? YES  Referral? NO  Letter or Form? NO  Lab Results? NO    HEALTH MAINTENANCE:  Has the patient has this done since their last visit?  Cervical screening/PAP: N/A  Mammo: N/A  Colon Screen: N/A  Diabetic Eye Exam (If applicable): N/A    Have you seen a specialist since your last visit: No    Vaccines Due? Yes,      Does patient have eCare?  YES     HM Due:   Health Maintenance   Topic Date Due    Hepatitis B Vaccine (1 of 3 - 3-dose series) Never done    Zoster Vaccine (1 of 2) Never done    DTaP, Tdap, and Td Vaccines (3 - Td or Tdap) 03/13/2020    Osteoporosis Screening  06/19/2020    Depression Screening (PHQ-2)  11/20/2020    Colorectal Cancer Screening  04/02/2021    Medicare Annual Wellness Visit  11/21/2021    Lipid Disorders Screening  08/24/2022    Breast Cancer Screening  09/07/2022    Diabetes Screening  08/13/2023    Pneumococcal Vaccine: 65+ years  Completed    Influenza Vaccine  Completed    Hepatitis C Screening  Completed    COVID-19 Vaccine  Completed    Hepatitis A Vaccine  Aged Out       PCP Verified?  Yes, El-Attar, Hanley Seamen, MD        Answers for HPI/ROS submitted by the patient on 11/21/2020  [facility] Score: 0

## 2020-12-08 ENCOUNTER — Other Ambulatory Visit (INDEPENDENT_AMBULATORY_CARE_PROVIDER_SITE_OTHER): Payer: Self-pay | Admitting: Family Medicine

## 2020-12-08 DIAGNOSIS — R0602 Shortness of breath: Secondary | ICD-10-CM

## 2020-12-08 DIAGNOSIS — J4520 Mild intermittent asthma, uncomplicated: Secondary | ICD-10-CM

## 2020-12-09 MED ORDER — ALBUTEROL SULFATE HFA 108 (90 BASE) MCG/ACT IN AERS
INHALATION_SPRAY | RESPIRATORY_TRACT | 3 refills | Status: DC
Start: 2020-12-09 — End: 2022-02-02

## 2020-12-16 ENCOUNTER — Ambulatory Visit: Payer: Medicare HMO | Admitting: Rheumatology

## 2020-12-16 ENCOUNTER — Encounter (INDEPENDENT_AMBULATORY_CARE_PROVIDER_SITE_OTHER): Payer: Self-pay | Admitting: Rheumatology

## 2020-12-16 DIAGNOSIS — M170 Bilateral primary osteoarthritis of knee: Secondary | ICD-10-CM

## 2020-12-16 DIAGNOSIS — M0579 Rheumatoid arthritis with rheumatoid factor of multiple sites without organ or systems involvement: Secondary | ICD-10-CM

## 2020-12-16 DIAGNOSIS — Z79899 Other long term (current) drug therapy: Secondary | ICD-10-CM

## 2020-12-16 NOTE — Progress Notes (Signed)
[place]  [address]  TEL: [phone]  l  FAX: [phone]    COVID-19 Telemedicine Note      NOTE:  I decided to perform this telemedicine encounter in lieu of the patient's scheduled in-person encounter with my clinic due to the current COVID-19 outbreak and the risk that an in-person visit would pose to the patient, clinic staff, or the general public.    I conducted this encounter from [facility] via secure, live, face-to-face video conference with the patient.   Distant Site Telemedicine Encounter      I conducted this encounter via secure, live, face-to-face video conference with the patient. I reviewed the risks and benefits of telemedicine as pertinent to this visit and the patient agreed to proceed.    Provider Location: On-site location (clinic, hospital, on-site office)  Patient Location: At home  Present with patient: No one else present       12/16/2020    IDENTIFYING DATA:  MRN: [id number]  DOB: October 03, 1950    CHIEF COMPLAINT:      Caitlin Wiley is a very pleasant 70 year old female who presents for follow up of rheumatoid arthritis.    RHEUMATOLOGIC HISTORY OF PRESENT ILLNESS:  Diagnosis:RA, OA; initial contult 11/30/2017  Presenting Symptoms/Timeline:2013 joint pain hands, wrists, knees, shoulders  Autoimmune Serologies:RF 140, CCP 16; CRP 17, ESR 23; UA 4.6  Pertinent Radiology:Xrays of hands, wrists, knees, feet: No erosions, consistent with OA, chondrocalcinosis  Medications Used:MXT 11/2017  Significant Flares:  Immunization Status: Influenza, PCV13, PPSV23, Zoster  Infectious Disease Screening: HepatitisB NR,Hepatitis C, TB quantiferon NR  Other: Afib on chronic anticoag,    INTERVAL HISTORY:  Patient seen today via telemedicine for follow-up of rheumatoid arthritis and osteoarthritis.  She was last seen nearly 4 months ago at which time she had stable disease.  Patient is on methotrexate monotherapy for treatment of  her inflammatory arthritis.  She also has significant osteoarthritis particularly in the knee and is a candidate for replacement surgery.  She is working on weight loss to reach target BMI.    Reports doing well. No significant joint complaints. Mostly in the knees with activity such walking, especially with carrying groceries. Better with rest. No significant joint swelling, warmth. Tolerating methotrexate without side effects as long as she eats prior to taking. Otherwise some nausea, loose stools. No rash. No infectious complications. She is up to date on both COVID and flu vaccine. Still takes precautions in public.     Weight is stable. Using weight loss app Noon.    REVIEW OF SYSTEMS:  Complete ROS is negative, except for those mentioned above in the Interval History    PROBLEM LIST:  Patient Active Problem List    Diagnosis Date Noted    Immunosuppression ([place]) [D63.8] 75/64/3329    Lichen sclerosus [J18.8] 04/30/2020    Postmenopausal bleeding [N95.0] 01/10/2020    Cervical polyp [N84.1] 01/10/2020    Vulvar lesion [N90.89] 01/10/2020    Rheumatoid arthritis involving multiple sites with positive rheumatoid factor [M05.79] 02/08/2018    Primary osteoarthritis of both knees [M17.0] 09/12/2017    Chronic anticoagulation [Z79.01] 08/02/2015    Right cervical radiculopathy [M54.12] 05/30/2015    Morbid obesity ([place]) [E66.01] 05/30/2015    Paroxysmal atrial fibrillation ([place]) [I48.0] 04/17/2014     A. S/p ablation ([place] [facility]) 10/19/2016      GERD (gastroesophageal reflux disease) [K21.9] 11/08/2012  OA (osteoarthritis) [M19.90] 07/28/2012    Asthma [J45.909] 07/24/2012    Edema [R60.9] 07/24/2012    Sleep apnea [G47.30] 07/24/2012    Pulmonary nodule [R91.1] 07/24/2012    Grief reaction [F43.21] 07/24/2012     05/11/2011         SOCIAL/FAMILY HISTORY:  Reviewed with patient. No changes from what is currently noted in EPIC    ALLERGIES:  Review of patient's allergies indicates:  Allergies    Allergen Reactions    Bee Venom Anaphylaxis    Ciprofloxacin Other     Joint aching    Pneumococcal Vaccines Skin: Hives    Sulfa Antibiotics Skin: Hives    Cats [Animals] Skin: Itching    Dust Mite Extract Skin: Itching     DUST    Pollen Extract Skin: Itching       MEDICATIONS:  Current Outpatient Medications   Medication Sig Dispense Refill    albuterol HFA 108 (90 Base) MCG/ACT inhaler inhale 1 to 2 puffs by mouth every 6 hours if needed for shortness of breath or wheezing 8.5 g 3    apixaban (Eliquis) 5 MG tablet Take 1 tablet (5 mg) by mouth 2 times a day. 180 tablet 3    Cholecalciferol (VITAMIN D3 OR) Take by mouth.      clobetasol 0.05 % ointment Apply topically 3 times a week. Apply to vulvar area. 60 g 1    famotidine 20 MG tablet Take 1 tablet (20 mg) by mouth 2 times a day. 180 tablet 3    Flaxseed, Linseed, (FLAXSEED OIL) 1200 MG Oral Cap None Entered      folic acid 1 MG tablet Take 3 tablets (3 mg) by mouth daily. 270 tablet 3    GLUCOSAMINE CHONDROITIN COMPLX OR       Magnesium 400 MG Oral Cap Take 400 mg by mouth.      methotrexate 2.5 MG tablet TAKE 8 TABLETS BY MOUTH EVERY 7 DAYS 96 tablet 3    montelukast 10 MG tablet Take 1 tablet (10 mg) by mouth daily. 90 tablet 3    Multiple Vitamins-Minerals (MULTIVITAMIN OR) no iron      Probiotic Product (PROBIOTIC DAILY OR)       SIMETHICONE-80 OR Take 2-3 tablets by mouth daily as needed.      triamterene-hydroCHLOROthiazide 37.5-25 MG tablet Take 1 tablet by mouth daily. 90 tablet 3     No current facility-administered medications for this visit.       LABS:  Results for orders placed or performed in visit on 11/21/20   Lipid Panel   Result Value Ref Range    Total Cholesterol 180 <200 mg/dL    Triglyceride 80 <150 mg/dL    HDL Cholesterol 64 >39 mg/dL    Non-HDL Cholesterol 116 0 - 159 mg/dL    LDL Cholesterol, NIH Equation 101 <130 mg/dL    Cholesterol/HDL Ratio 2.8     Lipid Panel, Additional Info. (NOTE)         RADIOLOGY:  None    ASSESSMENT AND PLAN:  Caitlin Wiley is a very pleasant 70 year old female who presents for follow up of rheumatoid arthritis.    #.Rheumatoid arthritis.Seropositive, nonerosive RA.Doingmuch better since MTX.No evidence of synovitis on exam today.Knee pain, CMC joint pain sounds mechanical in nature and likely related to osteoarthritis. We discussed therapeutic options including corticosteroid or hyaluronic acid derivative injectionin the future.She is a candidate for total knee replacement surgery once she is able to  reach her target BMI. Using Noon.    Stable disease including her osteoarthritis. She is tolerating methotrexate without significant side effects including no significant infectious complications.    -continuemethotrexate 92m once a week  -continuefolic aTFTD3UKdaily    #. High risk medication. MTX toxicitylabs ordered today. She will get these done at her cardiology visit.    #. Osteoarthritis. Most bothersome in the kneesand is activity related.She is interested in TKR but will need to lose some weight first.She has gained weight this winter. I recommend that she follow-up with her PCP to discuss how her mood condition may be affecting her health such as lack of desire to exercise, dietary intake. She could also discuss with her PCP about a referral to weight loss clinic.    #. Immunization. She is due for tetanus and shingles vaccine. Up to date on COVID and flu vaccine. Continue precautions in public.    #. Bone health.Last DEXA from 2017 was normal.DEXA 06/2018 normal.    -optimization of calcium and vitamin D  - daily weight bearing exercises    I spent 30 minutes on this visit today, including chart review prior to the visit, face-to-face time with the patient, and documentation and coordination of care after the visit.      All the patient's questions were answered. The patient will follow up in 3-4 months.    CC:  PCP    Dictated using speech recognition technology and electronically signed by YRoxy HorsemanMD   Note: This document may contain errors inherent to this technology. If you find any errors that might affect patient care, please contact our office as soon as possible.   [address] [place], [place] Ph. [phone]. [phone]

## 2020-12-23 ENCOUNTER — Encounter (INDEPENDENT_AMBULATORY_CARE_PROVIDER_SITE_OTHER): Payer: Self-pay | Admitting: Family Medicine

## 2020-12-24 ENCOUNTER — Ambulatory Visit: Payer: Self-pay | Admitting: Family Medicine

## 2020-12-24 NOTE — Telephone Encounter (Signed)
From 12/23/20 ecare. TE created to address this issue  ----------  22 hours ago (11:47 AM)     Hi - I have been ill for about 5 days. My symptoms aren't alarming but looking back I see that this seems to recur every 3 months or so.  It starts with a bloated fullness in my abdomen sometimes with a small poking pain in my left or right side. I have lots of gas during these episodes even though I stop eating or eventually eat very lightly. There is no diarrhea or constipation and no fever.   I don't want to overreact, but I don't want to ignore something that should be attended to.   Could you advise me as to what my next steps should be.    Thank you.   Caitlin Wiley

## 2020-12-25 NOTE — Telephone Encounter (Signed)
One my chart message sent and read by patient on 12/7. One voice mail left to call nurse line at [phone]. Closing Te    Reason for Disposition   Message left on identified voicemail    Additional Information   Negative: Caller has already spoken with the PCP (or office), and has no further questions   Negative: Caller has already spoken with another triager and has no further questions   Negative: Caller has already spoken with another triager or PCP (or office), and has further questions and triager able to answer questions.   Negative: Busy signal.  First attempt to contact caller.  Follow-up call scheduled within 15 minutes.   Negative: No answer.  First attempt to contact caller.  Follow-up call scheduled within 15 minutes.    Protocols used: NO CONTACT OR DUPLICATE CONTACT CALL-ADULT - [place]

## 2020-12-27 ENCOUNTER — Ambulatory Visit (INDEPENDENT_AMBULATORY_CARE_PROVIDER_SITE_OTHER): Payer: Self-pay

## 2020-12-27 NOTE — Telephone Encounter (Signed)
Call Type:  Triage Call    [place] Name:  [place] Name:  [place] [address]    Presenting Problem:  Had GI issues last week (loss of appetite, bloating), small poking pains in sides (right and left).  Now has a rash on both sides of face since late Thursday.  Concerned about lupus, ovarian cancer, shingles, etc...    Assessment:  01. Symptoms: What are your symptoms? rash to face  02. Onset: When did your symptoms/concern begin? (minutes, hours, days, or months ago?) bumpy red rash to face x 2 days  03. Action: What actions or treatments have you taken to treat your symptoms/concern? washed face with oatmeal soap  04. Response: What was the response to your actions taken? helps  05. Severity: How severe are your symptoms? (ex. interference with normal activities, pain scale, size or appearance of injury) bumpy red rash to face, feels like wind burn  06. Additional/Further RN Assessment: (ex. weight, I/O, etc.) was rubbing cats with face.    Triage Note:  Transferred to Survey    Guideline Title:  Rash or Redness - Localized (Adult)    Guideline Question:  [1] Mild localized rash? Yes    Recommended Disposition:  Home Care    Original Inclination:  Gone to an Urgent [place]    Intended Action:  [place] Advice:  Care Advice given per Rash - Localized and Cause Unknown (Adult) guideline.  Reassurance and Education - Mild Localized Rash:  * New rashes that are in one small (localized) area are usually due to skin contact with an irritating substance.  * Here is some care advice that should help.  * Consider allergic substances (contact dermatitis) from poison ivy, medical tape (e.g., adhesives), or new jewelry (nickel).  * Pets can carry allergic substances into your home caught in their fur (e.g., poison ivy or poison oak).  Expected Course:  * Most of these rashes pass in 2 to 3 days.  Call Back If:  * Rash spreads or becomes worse  * Rash lasts over 1 week  * You become worse  Wash  the Area:  * Wash the area once thoroughly with soap and water to remove any remaining irritants. Thereafter avoid soaps in this area.  * Cleanse the area if needed with warm water.  Home Care:  * You should be able to treat this at home.

## 2020-12-30 NOTE — Telephone Encounter (Signed)
Patient was triaged regarding this concern 12/27/20

## 2021-01-08 ENCOUNTER — Encounter (HOSPITAL_BASED_OUTPATIENT_CLINIC_OR_DEPARTMENT_OTHER): Payer: Medicare HMO | Admitting: Cardiovascular Disease

## 2021-01-14 ENCOUNTER — Other Ambulatory Visit: Payer: Self-pay

## 2021-01-20 ENCOUNTER — Other Ambulatory Visit (INDEPENDENT_AMBULATORY_CARE_PROVIDER_SITE_OTHER): Payer: Medicare HMO

## 2021-01-20 ENCOUNTER — Other Ambulatory Visit
Admission: RE | Admit: 2021-01-20 | Discharge: 2021-01-20 | Disposition: A | Discharge status: Home / Self Care | Payer: Medicare HMO | Source: Ambulatory Visit | Attending: Rheumatology | Admitting: Rheumatology

## 2021-01-20 DIAGNOSIS — Z139 Encounter for screening, unspecified: Secondary | ICD-10-CM

## 2021-01-20 DIAGNOSIS — Z79899 Other long term (current) drug therapy: Secondary | ICD-10-CM | POA: Insufficient documentation

## 2021-01-20 DIAGNOSIS — M0579 Rheumatoid arthritis with rheumatoid factor of multiple sites without organ or systems involvement: Secondary | ICD-10-CM | POA: Insufficient documentation

## 2021-01-20 LAB — CBC, DIFF
% Basophils: 1 %
% Eosinophils: 2 %
% Immature Granulocytes: 0 %
% Lymphocytes: 18 %
% Monocytes: 5 %
% Neutrophils: 74 %
% Nucleated RBC: 0 %
Absolute Eosinophil Count: 0.21 10*3/uL (ref 0.00–0.50)
Absolute Lymphocyte Count: 1.86 10*3/uL (ref 1.00–4.80)
Basophils: 0.06 10*3/uL (ref 0.00–0.20)
Hematocrit: 44 % (ref 36.0–45.0)
Hemoglobin: 14.4 g/dL (ref 11.5–15.5)
Immature Granulocytes: 0.03 10*3/uL (ref 0.00–0.05)
MCH: 28.9 pg (ref 27.3–33.6)
MCHC: 33 g/dL (ref 32.2–36.5)
MCV: 88 fL (ref 81–98)
Monocytes: 0.56 10*3/uL (ref 0.00–0.80)
Neutrophils: 7.56 10*3/uL — ABNORMAL HIGH (ref 1.80–7.00)
Nucleated RBC: 0 10*3/uL
Platelet Count: 336 10*3/uL (ref 150–400)
RBC: 4.98 10*6/uL (ref 3.80–5.00)
RDW-CV: 15.2 % — ABNORMAL HIGH (ref 11.0–14.5)
WBC: 10.28 10*3/uL — ABNORMAL HIGH (ref 4.3–10.0)

## 2021-01-20 LAB — COMPREHENSIVE METABOLIC PANEL
ALT (GPT): 17 U/L (ref 7–33)
AST (GOT): 20 U/L (ref 9–38)
Albumin: 4 g/dL (ref 3.5–5.2)
Alkaline Phosphatase (Total): 78 U/L (ref 38–172)
Anion Gap: 12 (ref 4–12)
Bilirubin (Total): 0.8 mg/dL (ref 0.2–1.3)
Calcium: 9.5 mg/dL (ref 8.9–10.2)
Carbon Dioxide, Total: 25 meq/L (ref 22–32)
Chloride: 104 meq/L (ref 98–108)
Creatinine: 0.91 mg/dL (ref 0.38–1.02)
Glucose: 101 mg/dL (ref 62–125)
Potassium: 3.8 meq/L (ref 3.6–5.2)
Protein (Total): 6.7 g/dL (ref 6.0–8.2)
Sodium: 141 meq/L (ref 135–145)
Urea Nitrogen: 18 mg/dL (ref 8–21)
eGFR by CKD-EPI 2021: >60 mL/min/{1.73_m2} (ref >59)

## 2021-01-20 NOTE — Progress Notes (Signed)
LABS

## 2021-01-21 NOTE — Result Encounter Note (Signed)
Labs stable

## 2021-03-31 ENCOUNTER — Ambulatory Visit: Payer: Medicare HMO | Attending: Cardiovascular Disease | Admitting: Cardiovascular Disease

## 2021-03-31 ENCOUNTER — Other Ambulatory Visit: Payer: Self-pay

## 2021-03-31 ENCOUNTER — Ambulatory Visit (INDEPENDENT_AMBULATORY_CARE_PROVIDER_SITE_OTHER): Payer: Medicare HMO

## 2021-03-31 VITALS — BP 110/82 | HR 102 | Ht 65.0 in | Wt 282.0 lb

## 2021-03-31 DIAGNOSIS — I480 Paroxysmal atrial fibrillation: Secondary | ICD-10-CM | POA: Insufficient documentation

## 2021-03-31 DIAGNOSIS — I10 Essential (primary) hypertension: Secondary | ICD-10-CM | POA: Insufficient documentation

## 2021-03-31 NOTE — Patient Instructions (Addendum)
Overall the frequency of atrial fibrillation is low, we agreed to continue observation for now along with the anticoagulation. Keep using the CPAP!!  Let us know if the arrhythmias get more bothersome, send [place] a rhythm strip of the next atrial fibrillation.     Your blood pressure is controlled, no additional medication is needed. Keep trying to lose weight.

## 2021-03-31 NOTE — Progress Notes (Signed)
Caitlin Wiley returns for management of paroxysmal atrial fibrillation and hypertension.  Additional problems include rheumatoid arthritis and sleep apnea.  Since I saw her last in December 2021 she reports a GYN evaluation which uncovered a benign polyp, the polyp was removed.  She reports blood pressure is well controlled typically 782 systolic, maximum 423 systolic, and diastolic blood pressure generally less than 80 mmHg.  She describes about once per month episodes of "skips" which in the past have corresponded to PACs on her rhythm monitor.  She describes only 1 or 2 episodes of atrial fibrillation in the last year lasting 30 minutes or less.  There can be associated shortness of breath and diaphoresis with physical activity but no lightheadedness or syncope.  She denies bleeding or TIA symptoms.  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  She continues to use CPAP nightly for about 8 hours.  She has lost about 4 pounds.    Review of patient's allergies indicates:  Allergies   Allergen Reactions   . Bee Venom Anaphylaxis   . Ciprofloxacin Other     Joint aching   . Pneumococcal Vaccines Skin: Hives   . Sulfa Antibiotics Skin: Hives   . Cats [Animals] Skin: Itching   . Dust Mite Extract Skin: Itching     DUST   . Pollen Extract Skin: Itching     Current Outpatient Medications   Medication Sig Dispense Refill   . albuterol HFA 108 (90 Base) MCG/ACT inhaler inhale 1 to 2 puffs by mouth every 6 hours if needed for shortness of breath or wheezing 8.5 g 3   . apixaban (Eliquis) 5 MG tablet Take 1 tablet (5 mg) by mouth 2 times a day. 180 tablet 3   . Cholecalciferol (VITAMIN D3 OR) Take by mouth.     . clobetasol 0.05 % ointment Apply topically 3 times a week. Apply to vulvar area. 60 g 1   . famotidine 20 MG tablet Take 1 tablet (20 mg) by mouth 2 times a day. 180 tablet 3   . Flaxseed, Linseed, (FLAXSEED OIL) 1200 MG Oral Cap None Entered     . folic acid 1 MG tablet Take 3 tablets (3 mg) by mouth  daily. 270 tablet 3   . GLUCOSAMINE CHONDROITIN COMPLX OR      . Magnesium 400 MG Oral Cap Take 1 capsule (400 mg) by mouth.     . methotrexate 2.5 MG tablet TAKE 8 TABLETS BY MOUTH EVERY 7 DAYS 96 tablet 3   . montelukast 10 MG tablet Take 1 tablet (10 mg) by mouth daily. 90 tablet 3   . Multiple Vitamins-Minerals (MULTIVITAMIN OR) no iron     . Probiotic Product (PROBIOTIC DAILY OR)      . SIMETHICONE-80 OR Take 2-3 tablets by mouth daily as needed.     . triamterene-hydroCHLOROthiazide 37.5-25 MG tablet Take 1 tablet by mouth daily. 90 tablet 3     No current facility-administered medications for this visit.     Patient Active Problem List   Diagnosis   . Asthma   . Edema   . Sleep apnea   . Pulmonary nodule   . Grief reaction   . OA (osteoarthritis)   . GERD (gastroesophageal reflux disease)   . Paroxysmal atrial fibrillation (HCC)   . Right cervical radiculopathy   . Morbid obesity ([place])   . Chronic anticoagulation   . Primary osteoarthritis of both knees   . Rheumatoid arthritis involving  multiple sites with positive rheumatoid factor   . Postmenopausal bleeding   . Cervical polyp   . Vulvar lesion   . Lichen sclerosus   . Immunosuppression (HCC)     BP 110/82   Pulse (!) 102   Ht '5\' 5"'$  (1.651 m)   Wt (!) 127.9 kg (282 lb)   LMP  (LMP Unknown)   SpO2 96%   BMI 46.93 kg/m   She is alert and comfortable.  Jugular venous pressure about 6 cm of water.  No carotid bruits.  Lungs are clear.  Rhythm regular with no murmur or gallop.  No peripheral edema.    Laboratories: From January comprehensive metabolic panel normal, CBC within normal limits, lipids in November of last year showed below average risk with a cholesterol to HDL ratio of only 2.8.    A/P:  (I48.0) Paroxysmal atrial fibrillation ([place])  (primary encounter diagnosis)  She describes both premature beats and rare episodes of atrial fibrillation.  She does not feel her symptoms warrant antiarrhythmic drug therapy, and we agreed to continue  observation.  She will continue to use CPAP regularly.  Plan: Observation and long-term anticoagulation    (I10) Essential hypertension  Blood pressure well controlled, she will continue to monitor.  Electrolytes and renal function normal on her current diuretic therapy.  Plan: Continue to monitor blood pressure    I spent a total of 33 minutes for the patient's care on the date of the service.

## 2021-04-22 ENCOUNTER — Ambulatory Visit (INDEPENDENT_AMBULATORY_CARE_PROVIDER_SITE_OTHER): Payer: Medicare HMO | Admitting: Family

## 2021-04-22 ENCOUNTER — Other Ambulatory Visit: Payer: Self-pay

## 2021-04-22 ENCOUNTER — Ambulatory Visit (INDEPENDENT_AMBULATORY_CARE_PROVIDER_SITE_OTHER): Payer: Self-pay

## 2021-04-22 VITALS — BP 143/81 | HR 95 | Temp 98.2°F

## 2021-04-22 DIAGNOSIS — R1011 Right upper quadrant pain: Secondary | ICD-10-CM

## 2021-04-22 LAB — CBC, DIFF
% Basophils: 1 %
% Eosinophils: 1 %
% Immature Granulocytes: 1 %
% Lymphocytes: 12 %
% Monocytes: 6 %
% Neutrophils: 79 %
% Nucleated RBC: 0 %
Absolute Eosinophil Count: 0.08 10*3/uL (ref 0.00–0.50)
Absolute Lymphocyte Count: 1.28 10*3/uL (ref 1.00–4.80)
Basophils: 0.07 10*3/uL (ref 0.00–0.20)
Hematocrit: 46 % — ABNORMAL HIGH (ref 36.0–45.0)
Hemoglobin: 15.2 g/dL (ref 11.5–15.5)
Immature Granulocytes: 0.05 10*3/uL (ref 0.00–0.05)
MCH: 29 pg (ref 27.3–33.6)
MCHC: 33.3 g/dL (ref 32.2–36.5)
MCV: 87 fL (ref 81–98)
Monocytes: 0.64 10*3/uL (ref 0.00–0.80)
Neutrophils: 8.66 10*3/uL — ABNORMAL HIGH (ref 1.80–7.00)
Nucleated RBC: 0 10*3/uL
Platelet Count: 339 10*3/uL (ref 150–400)
RBC: 5.25 10*6/uL — ABNORMAL HIGH (ref 3.80–5.00)
RDW-CV: 14.9 % — ABNORMAL HIGH (ref 11.0–14.5)
WBC: 10.78 10*3/uL — ABNORMAL HIGH (ref 4.3–10.0)

## 2021-04-22 LAB — COMPREHENSIVE METABOLIC PANEL
ALT (GPT): 14 U/L (ref 7–33)
AST (GOT): 18 U/L (ref 9–38)
Albumin: 4.2 g/dL (ref 3.5–5.2)
Alkaline Phosphatase (Total): 79 U/L (ref 38–172)
Anion Gap: 13 — ABNORMAL HIGH (ref 4–12)
Bilirubin (Total): 0.9 mg/dL (ref 0.2–1.3)
Calcium: 9.6 mg/dL (ref 8.9–10.2)
Carbon Dioxide, Total: 22 meq/L (ref 22–32)
Chloride: 102 meq/L (ref 98–108)
Creatinine: 0.92 mg/dL (ref 0.38–1.02)
Glucose: 98 mg/dL (ref 62–125)
Potassium: 3.7 meq/L (ref 3.6–5.2)
Protein (Total): 7.2 g/dL (ref 6.0–8.2)
Sodium: 137 meq/L (ref 135–145)
Urea Nitrogen: 12 mg/dL (ref 8–21)
eGFR by CKD-EPI 2021: >60 mL/min/{1.73_m2} (ref >59)

## 2021-04-22 NOTE — Telephone Encounter (Signed)
Call Type:  Triage Call    [place] Name:  [place] Name:  [place] [address]    Presenting Problem:  pain in RT side    Assessment:  01. Symptoms: What are your symptoms? RUQ abdominal pain  02. Onset: When did your symptoms/concern begin? (minutes, hours, days, or months ago?) 2 days ago-has had this once a month for the past 3 months  03. Action: What actions or treatments have you taken to treat your symptoms/concern? stopped eating and drinking fluids  04. Response: What was the response to your actions taken? helps  05. Severity: How severe are your symptoms? (ex. interference with normal activities, pain scale, size or appearance of injury) pain comes and goes-pain is mild at present. Pain last for about a minute at a time. Passed a large stool earlier today and then had some diarrhea.  06. Additional/Further RN Assessment: (ex. weight, I/O, etc.) history of heart ablation    Triage Note:  Warm transferred to clinic.    Guideline Title:  Abdominal Pain - Upper (Adult)    Guideline Question:  [1] Abdominal pain is a chronic symptom (recurrent or ongoing AND present > 4 weeks)? Yes    Recommended Disposition:  See PCP Within 2 Weeks    Original Inclination:  Called your Doctor Community education officer, Pharmacist)    Intended Action:  See PCP Within 2 Weeks    Care Advice:  Care Advice given per Abdominal Pain, Upper (Adult) guideline.  Avoid Alcohol:  * Alcohol is a stomach irritant.  Take Antacids:  * Try taking an antacid (e.g., Mylanta, Maalox) 1 hour after meals and before bedtime.  * Dose: 2 tablespoons (30 ml) of liquid.  Call Back If:  * Severe pain present over 1 hour  * Constant pain present over 2 hours  * You become worse  Antacid:  * If having pain now, try taking an antacid (e.g., Mylanta, Maalox).  * Dose: 2 tablespoons (30 ml) of liquid.  See PCP Within 2 Weeks:  * You need to be seen for this ongoing problem within the next 2 weeks.  Pain Diary:  * Keep a pain diary.  * Write down the date,  time, place, what you were doing at the time, how bad it is, how long it lasts, what makes it better, etc.  * Reason: to try to find the cause or some of the triggers.

## 2021-04-22 NOTE — Progress Notes (Signed)
Chief Complaint   Patient presents with   . Abdominal Heaviness/Pressure     Pt reported "discomfort" in Right Side/Abdomen for several weeks. More bowel movements than usual, diarrhea, no fevers or fatigue. Hx of diverticulitis.      Subjective:     Caitlin Wiley is a 71 year old female who presents on 04/22/2021.    Patient presents with 2 weeks of right-sided abdominal pain.  Improving over the last 2 weeks.  She has stopped eating over the last 2 days, which seems to have helped her symptoms.  Fever, chills, body aches.  Reports history of recurrent diverticulitis, however that is normally left lower quadrant.  No abdominal surgeries and she does still have her gallbladder and appendix.  She reports associated diarrhea and bloating at onset, no other symptoms.     Objective:    Vitals: BP (!) 143/81   Pulse 95   Temp 36.8 C (Temporal)   LMP  (LMP Unknown)   SpO2 99%   Physical Exam  Vitals reviewed.   Pulmonary:      Effort: Pulmonary effort is normal.   Abdominal:      Palpations: Abdomen is soft.      Tenderness: There is abdominal tenderness (mild) in the right upper quadrant and right lower quadrant. There is no right CVA tenderness, left CVA tenderness, guarding or rebound.   Neurological:      Mental Status: She is alert.           Assessment and Plan:        1. Right upper quadrant abdominal pain  Well-appearing 71 year old female on methotrexate who presents with improving abdominal pain.  Given her vital signs I have low concern for acute abdominal process, however we will check lab studies and we discussed ER precautions with any worsening symptoms during the course of outpatient evaluation.  She is agreeable to this plan.  Patient scheduled ultrasound for tomorrow afternoon.  We discussed differential diagnosis including cholecystitis, cholelithiasis, gas pain, musculoskeletal, inflammatory bowel disease, appendicitis.  - CBC with Diff; Future  - Comprehensive Metabolic Panel; Future  - US  Abdomen Complete; Future  - Comprehensive Metabolic Panel  - CBC with Diff      Follow-up: to ED if worsening, or follow up with pcp in 5-7 days for follow up     Caitlin Wiley understood and agreed to the plan as outlined above. Questions answered    Trula Ore, ARNP, FNP-C

## 2021-04-23 ENCOUNTER — Ambulatory Visit
Admission: RE | Admit: 2021-04-23 | Discharge: 2021-04-23 | Disposition: A | Discharge status: Home / Self Care | Payer: Medicare HMO | Attending: Diagnostic Radiology | Admitting: Diagnostic Radiology

## 2021-04-23 ENCOUNTER — Encounter (INDEPENDENT_AMBULATORY_CARE_PROVIDER_SITE_OTHER): Payer: Self-pay | Admitting: Family Medicine

## 2021-04-23 DIAGNOSIS — R1011 Right upper quadrant pain: Secondary | ICD-10-CM | POA: Insufficient documentation

## 2021-04-23 NOTE — Telephone Encounter (Signed)
Forward to Dr. Velvet Bathe, please advise [place] report and Dr. Benjiman Core below message.            The ultrasound showed kidney stones but it is not clear if they are the cause of your abdominal pain. If sudden or severe worsening recheck at the ER, otherwise follow-up with your primary care provider for recheck.   Written by Domenick Bookbinder, MD on 04/23/2021 1:03 PM PDT

## 2021-04-23 NOTE — Result Encounter Note (Signed)
Hi Reneta,  Your labs did not show any significant liver abnormalities. There is evidence of a very mild infection or stress on the body, which also does not look abnormal compared to your previous lab results. Continue with the plan we discussed in clinic.  Trula Ore, ARNP, FNP-C

## 2021-05-04 ENCOUNTER — Telehealth (INDEPENDENT_AMBULATORY_CARE_PROVIDER_SITE_OTHER): Payer: Self-pay | Admitting: Family Medicine

## 2021-05-04 DIAGNOSIS — L900 Lichen sclerosus et atrophicus: Secondary | ICD-10-CM

## 2021-05-04 MED ORDER — CLOBETASOL PROPIONATE 0.05 % EX OINT
TOPICAL_OINTMENT | CUTANEOUS | 1 refills | Status: DC
Start: 2021-05-04 — End: 2021-10-21

## 2021-05-04 NOTE — Telephone Encounter (Signed)
Patietn previously seen by OBGYN for this lichen sclerosis.   They recommended follow up in 6-12 months when seen in July or 22.   Recommend patient schedule follow up prior to continuing with uninterrupted steroids

## 2021-05-04 NOTE — Telephone Encounter (Signed)
Medication Refill Documentation    Name of Medication: clobetasol 0.05 % ointment    Prescribing provider:  Rona Ravens (patient states that she wants to request through PCP because  Rona Ravens is longer @ [facility] for her Lichen sclerosus)     Protocol used (by medication type, not necessarily patient diagnosis): N/A    Date last filled: 05/09/20    Date last seen for this issue:  07/29/2020      BP Readings from Last 2 Encounters:   04/22/21 (!) 143/81   03/31/21 110/82       Next scheduled appointment date:  Visit date not found

## 2021-05-04 NOTE — Telephone Encounter (Signed)
mychart sent.

## 2021-06-08 ENCOUNTER — Encounter (HOSPITAL_BASED_OUTPATIENT_CLINIC_OR_DEPARTMENT_OTHER): Payer: Self-pay | Admitting: Rheumatology

## 2021-06-12 ENCOUNTER — Other Ambulatory Visit: Payer: Self-pay

## 2021-06-13 ENCOUNTER — Encounter (INDEPENDENT_AMBULATORY_CARE_PROVIDER_SITE_OTHER): Payer: Self-pay | Admitting: Family Medicine

## 2021-06-13 NOTE — Telephone Encounter (Signed)
Colonoscopy report requested for pt.    Your fax has been successfully sent to [facility] at [phone].  ------------------------------------------------------------  From: Darcus Austin  ------------------------------------------------------------  06/13/2021 1:50:46 PM Transmission Record          Sent to [phone] with remote ID ""          Inbound user ID [email or web address], routing code 100          Result: (0/339;0/0) Success          Page record: 1 - 1          Elapsed time: 01:17 on channel 19    Closing Te

## 2021-07-01 ENCOUNTER — Ambulatory Visit: Payer: Medicare HMO | Attending: Rheumatology | Admitting: Rheumatology

## 2021-07-01 VITALS — BP 137/83 | HR 79 | Wt 277.0 lb

## 2021-07-01 DIAGNOSIS — M0579 Rheumatoid arthritis with rheumatoid factor of multiple sites without organ or systems involvement: Secondary | ICD-10-CM | POA: Insufficient documentation

## 2021-07-01 DIAGNOSIS — M170 Bilateral primary osteoarthritis of knee: Secondary | ICD-10-CM | POA: Insufficient documentation

## 2021-07-01 DIAGNOSIS — M5137 Other intervertebral disc degeneration, lumbosacral region: Secondary | ICD-10-CM | POA: Insufficient documentation

## 2021-07-01 DIAGNOSIS — Z79899 Other long term (current) drug therapy: Secondary | ICD-10-CM | POA: Insufficient documentation

## 2021-07-01 NOTE — Patient Instructions (Addendum)
It was a pleasure seeing you in clinic today.    Continue your current medications  Referral to PT  Get labs in 2 months  Follow up in 4 months    If you have any questions or concerns before your next appointment, please do not hesitate to contact me. Our clinic phone number is [phone] and I am also available via eCare.

## 2021-07-01 NOTE — Progress Notes (Signed)
[place]  [address]  TEL: [phone]  l  FAX: [phone]    Follow Up Clinic Note  07/01/2021    IDENTIFYING DATA/CC:    Caitlin Wiley is a very pleasant 71 year old female who presents for follow up of rheumatoid arthritis, osteoarthritis.    RHEUMATOLOGIC HISTORY OF PRESENT ILLNESS:  Diagnosis: RA, OA; initial contult 11/30/2017  Presenting Symptoms/Timeline: 2013 joint pain hands, wrists, knees, shoulders  Autoimmune Serologies: RF 140, CCP 16; CRP 17, ESR 23; UA 4.6  Pertinent Radiology: Xrays of hands, wrists, knees, feet: No erosions, consistent with OA, chondrocalcinosis  Medications Used: MXT 11/2017  Significant Flares:  Immunization Status: Influenza, PCV13, PPSV23, Zoster  Infectious Disease Screening: Hepatitis B NR, Hepatitis C, TB quantiferon NR  Other: Afib on chronic anticoag,     INTERVAL HISTORY:  Patient returns for follow-up of rheumatoid arthritis and osteoarthritis.  She was last seen over 6 months ago via telemedicine visit at which time she was doing well.  Patient is on methotrexate monotherapy for treatment of her rheumatoid arthritis.  She also has significant osteoarthritis particularly in the knees and is a candidate for replacement surgery.    Reports doing okay. Has been doing much more gardening. This is particularly bad in the bilateral CMC joints. Knees continue to bother her.  Also noticed more lower back pain particularly after working in the garden.    Developed facial rash after putting on sunscreen. Worried about lupus. It did not appear after sitting out in the sun but afterwards when she put on sunscreen. Rash disappear after about 3 days.    Has intermittent diarrhea, cramping often associated with foods. MTX causes mild loose stool but this is different.  She is otherwise tolerating methotrexate.  No infectious complications.  She continues to take COVID precautions.    She had labs done in April to evaluate for  this.  These are stable.    REVIEW OF SYSTEMS:  Complete ROS is negative, except for those mentioned above in the Interval History    PROBLEM LIST:  Patient Active Problem List    Diagnosis Date Noted    Immunosuppression ([place]) [Q73.4] 19/37/9024    Lichen sclerosus [O97.3] 04/30/2020    Postmenopausal bleeding [N95.0] 01/10/2020    Cervical polyp [N84.1] 01/10/2020    Vulvar lesion [N90.89] 01/10/2020    Rheumatoid arthritis involving multiple sites with positive rheumatoid factor [M05.79] 02/08/2018    Primary osteoarthritis of both knees [M17.0] 09/12/2017    Chronic anticoagulation [Z79.01] 08/02/2015    Right cervical radiculopathy [M54.12] 05/30/2015    Morbid obesity ([place]) [E66.01] 05/30/2015    Paroxysmal atrial fibrillation ([place]) [I48.0] 04/17/2014     A. S/p ablation ([place] [facility]) 10/19/2016        GERD (gastroesophageal reflux disease) [K21.9] 11/08/2012    OA (osteoarthritis) [M19.90] 07/28/2012    Asthma [J45.909] 07/24/2012    Edema [R60.9] 07/24/2012    Sleep apnea [G47.30] 07/24/2012    Pulmonary nodule [R91.1] 07/24/2012    Grief reaction [F43.21] 07/24/2012     05/11/2011           SOCIAL/FAMILY HISTORY:  Reviewed with patient. No changes from what is currently noted in EPIC    ALLERGIES:  Review of patient's allergies indicates:  Allergies   Allergen Reactions    Bee Venom Anaphylaxis    Ciprofloxacin Other     Joint aching    Pneumococcal Vaccines  Skin: Hives    Sulfa Antibiotics Skin: Hives    Cats [Animals] Skin: Itching    Dust Mite Extract Skin: Itching     DUST    Pollen Extract Skin: Itching       MEDICATIONS:  Current Outpatient Medications   Medication Sig Dispense Refill    albuterol HFA 108 (90 Base) MCG/ACT inhaler inhale 1 to 2 puffs by mouth every 6 hours if needed for shortness of breath or wheezing 8.5 g 3    apixaban (Eliquis) 5 MG tablet Take 1 tablet (5 mg) by mouth 2 times a day. 180 tablet 3    Cholecalciferol (VITAMIN D3 OR) Take by mouth.      clobetasol 0.05 % ointment Apply  topically 3 times a week. Apply to vulvar area. 60 g 1    famotidine 20 MG tablet Take 1 tablet (20 mg) by mouth 2 times a day. 180 tablet 3    Flaxseed, Linseed, (FLAXSEED OIL) 1200 MG Oral Cap None Entered      folic acid 1 MG tablet Take 3 tablets (3 mg) by mouth daily. 270 tablet 3    GLUCOSAMINE CHONDROITIN COMPLX OR       Magnesium 400 MG Oral Cap Take 1 capsule (400 mg) by mouth.      methotrexate 2.5 MG tablet TAKE 8 TABLETS BY MOUTH EVERY 7 DAYS 96 tablet 3    montelukast 10 MG tablet Take 1 tablet (10 mg) by mouth daily. 90 tablet 3    Multiple Vitamins-Minerals (MULTIVITAMIN OR) no iron      Probiotic Product (PROBIOTIC DAILY OR)       SIMETHICONE-80 OR Take 2-3 tablets by mouth daily as needed.      triamterene-hydroCHLOROthiazide 37.5-25 MG tablet Take 1 tablet by mouth daily. 90 tablet 3     No current facility-administered medications for this visit.       PHYSICAL EXAMINATION:  Vital signs:  BP 137/83   Pulse 79   Wt (!) 125.6 kg (277 lb)   LMP  (LMP Unknown)   SpO2 95%   BMI 46.10 kg/m   General:  Awake, alert, and oriented, no apparent distress, pleasant, and cooperative  Psychologic:  Mood is euthymic, affect is congruent  EYES: Anicteric sclera, no conjunctival injection  ENT:  Normocephalic, atraumatic, moist membranes, no oral ulcers   Pulmonary:  Non-labored breathing, no wheezes, rhonchi, or rales  Cardiovascular:  RRR, no murmurs, rubs or gallops  Skin:  Normal temperature and texture, No visible rashes, ulcers, or nodules  Neurologic:  Light touch sensation is grossly intact. Face symmetric   Musculoskeletal:    Gait: Normal  Joints: No evidence of synovitis, joint swelling.  Bilateral CMC subluxation and tenderness.  Nails: No pitting, clubbing, or cyanosis noted in the fingernails bilaterally   Muscle strength: Strength is grossly 5 out of 5 throughout the bilateral upper and lower extremities    LABS:    Pending    Results for orders placed or performed in visit on 04/22/21    Comprehensive Metabolic Panel   Result Value Ref Range    Sodium 137 135 - 145 meq/L    Potassium 3.7 3.6 - 5.2 meq/L    Chloride 102 98 - 108 meq/L    Carbon Dioxide, Total 22 22 - 32 meq/L    Anion Gap 13 (H) 4 - 12    Glucose 98 62 - 125 mg/dL    Urea Nitrogen 12 8 - 21 mg/dL  Creatinine 0.92 0.38 - 1.02 mg/dL    Protein (Total) 7.2 6.0 - 8.2 g/dL    Albumin 4.2 3.5 - 5.2 g/dL    Bilirubin (Total) 0.9 0.2 - 1.3 mg/dL    Calcium 9.6 8.9 - 10.2 mg/dL    AST (GOT) 18 9 - 38 U/L    Alkaline Phosphatase (Total) 79 38 - 172 U/L    ALT (GPT) 14 7 - 33 U/L    eGFR by CKD-EPI 2021 >60 >59 mL/min/1.73_m2   CBC with Diff   Result Value Ref Range    WBC 10.78 (H) 4.3 - 10.0 10*3/uL    RBC 5.25 (H) 3.80 - 5.00 10*6/uL    Hemoglobin 15.2 11.5 - 15.5 g/dL    Hematocrit 46 (H) 36.0 - 45.0 %    MCV 87 81 - 98 fL    MCH 29.0 27.3 - 33.6 pg    MCHC 33.3 32.2 - 36.5 g/dL    Platelet Count 339 150 - 400 10*3/uL    RDW-CV 14.9 (H) 11.0 - 14.5 %    % Neutrophils 79 %    % Lymphocytes 12 %    % Monocytes 6 %    % Eosinophils 1 %    % Basophils 1 %    % Immature Granulocytes 1 %    Neutrophils 8.66 (H) 1.80 - 7.00 10*3/uL    Absolute Lymphocyte Count 1.28 1.00 - 4.80 10*3/uL    Monocytes 0.64 0.00 - 0.80 10*3/uL    Absolute Eosinophil Count 0.08 0.00 - 0.50 10*3/uL    Basophils 0.07 0.00 - 0.20 10*3/uL    Immature Granulocytes 0.05 0.00 - 0.05 10*3/uL    Nucleated RBC 0.00 0.00 10*3/uL    % Nucleated RBC 0 %       RADIOLOGY:   None    DISEASE ACTIVITY:  Patient Function: 1.3  Patient Pain Assessment: 5  Patient Global Assessment: 3  Combined Rapid 3 Score: 9.3    ASSESSMENT AND PLAN:  Kadience Macchi is a very pleasant 71 year old female who presents for follow up of rheumatoid arthritis, osteoarthritis.    #. Rheumatoid arthritis. Seropositive, nonerosive RA. Doing much better since MTX.  No evidence of synovitis on exam today. Knee pain, CMC joint pain sounds mechanical in nature and likely related to osteoarthritis.  We  previously discussed therapeutic options including corticosteroid or hyaluronic acid derivative injection in the future.  She is a candidate for total knee replacement surgery once she is able to reach her target BMI.  Using Noon.     Stable disease including her osteoarthritis. She is tolerating methotrexate without significant side effects including no significant infectious complications.      - continue methotrexate 60m once a week   - continue folic acid 244mdaily     #. High risk medication. MTX toxicity labs from April are within normal limits.  She will repeat labs in 2 months.    #. Atopic dermatitis on face. Appeared after sunscreen use and disappeared in few days. Low suspicion for lupus rash. Reassured pt.      #. Osteoarthritis. Most bothersome in the knees and is activity related. She is interested in TKR but will need to lose some weight first.  She has gained weight this winter.  I recommend that she follow-up with her PCP to discuss how her mood condition may be affecting her health such as lack of desire to exercise, dietary intake.  She could also discuss with  her PCP about a referral to weight loss clinic.  We did not discuss this.     #. Immunization. She is due for tetanus and shingles vaccine. Up to date on COVID and flu vaccine. Continue precautions in public.     #. Bone health. Last DEXA from 2017 was normal. DEXA 06/2018 normal.      - optimization of calcium and vitamin D   - daily weight bearing exercises       All the patient's questions were answered. The patient will follow up in 4 months.    Dictated using Chief of Staff and electronically signed by Roxy Horseman, MD   Note: This document may contain errors inherent to this technology. If you find any errors that might affect patient care, please contact our office as soon as possible.   [address], [address], [place], [place]  Ph. [phone] Fx. [phone]

## 2021-08-01 ENCOUNTER — Ambulatory Visit (INDEPENDENT_AMBULATORY_CARE_PROVIDER_SITE_OTHER): Payer: Medicare HMO

## 2021-08-01 DIAGNOSIS — Z23 Encounter for immunization: Secondary | ICD-10-CM

## 2021-08-01 MED ORDER — COVID-19MRNA BIVAL VACC PFIZER 30 MCG/0.3ML IM SUSP
30.0000 ug | Freq: Once | INTRAMUSCULAR | Status: AC
Start: 2021-08-01 — End: 2021-08-01
  Administered 2021-08-01: 30 ug via INTRAMUSCULAR

## 2021-08-01 NOTE — Progress Notes (Signed)
Vaccine Screening Questions    Interpreter used: No    Vaccine(s) to be administered: Pfizer  Relevant VIS or EUA was offered and declined by patient today      1. Are you allergic to Latex? NO    2.  Have you had a serious reaction or an allergic reaction to a vaccine?  NO    3.  Currently have a moderate or severe illness, including fever?  NO    4.  Ever had a seizure or any neurological problem associated with a vaccine? (DTaP/TDaP/DTP pertinent) NO    5.  Is patient receiving any live vaccinations today? (Varicella-Chickenpox, MMR-Measles/Mumps/Rubella, Zostavax, Flumist, Yellow Fever) NOTE: oral rotavirus is exempt  NO    If YES to any of the questions above - Do NOT give vaccine.  Consult with RN or provider in clinic.  (#5 can be YES if all Live vaccine questions are answered NO)    If NO to all questions above - Patient may receive vaccine.    6.  Do you need to receive the Flu vaccine today? NO    7. Do you need to receive Covid-19 vaccine today? YES:    Covid Pre-Vaccination Screening    Are you feeling sick today? NO  Have you ever received a dose of COVID 19 vaccine? YES  If yes, which vaccine have you received last? Pfizer  When was your last COVID vaccine dose? 10/2020   Have you ever had a severe allergic reaction (e.g. anaphylaxis) to anything? For example, a reaction for which you were treated with epinephrine (EpiPen) or for which you had to go to the hospital? NO  Was the severe allergic reaction after receiving a COVID 19 vaccine? N/A  Was the severe allergic reaction after receiving another vaccine or another injectable medication? N/A  Do you have a physical vaccination card (compare with electronic record)? YES  Do you give consent today to receive a COVID 19 vaccination? YES        MAR Documentation:    Medication Administrations This Visit         COVID-19 mRNA BIVALENT vaccine (Pfizer 12 yrs and older) injection 30 mcg Admin Date  08/01/2021  09:56 Action  Given Dose  30 mcg  Route  Intramuscular Site  Left Deltoid Administered By  [place], , CMA    Ordering Provider: Glissmeyer, Brandon E, ARNP    NDC: [address]-2    Lot#: [id number]                Vaccine given today without initial adverse effect. YES  All patients are encouraged to wait 15 minutes before leaving after receiving any vaccine.     [place], CMA

## 2021-08-27 ENCOUNTER — Encounter (INDEPENDENT_AMBULATORY_CARE_PROVIDER_SITE_OTHER): Payer: Self-pay

## 2021-08-31 ENCOUNTER — Other Ambulatory Visit (INDEPENDENT_AMBULATORY_CARE_PROVIDER_SITE_OTHER): Payer: Medicare HMO

## 2021-08-31 ENCOUNTER — Ambulatory Visit
Admission: RE | Admit: 2021-08-31 | Discharge: 2021-08-31 | Disposition: A | Discharge status: Home / Self Care | Payer: Medicare HMO | Source: Ambulatory Visit | Attending: Rheumatology | Admitting: Rheumatology

## 2021-08-31 DIAGNOSIS — Z79899 Other long term (current) drug therapy: Secondary | ICD-10-CM | POA: Insufficient documentation

## 2021-08-31 DIAGNOSIS — M0579 Rheumatoid arthritis with rheumatoid factor of multiple sites without organ or systems involvement: Secondary | ICD-10-CM | POA: Insufficient documentation

## 2021-08-31 DIAGNOSIS — Z139 Encounter for screening, unspecified: Secondary | ICD-10-CM

## 2021-08-31 LAB — COMPREHENSIVE METABOLIC PANEL
ALT (GPT): 14 U/L (ref 7–33)
AST (GOT): 17 U/L (ref 9–38)
Albumin: 4 g/dL (ref 3.5–5.2)
Alkaline Phosphatase (Total): 75 U/L (ref 38–172)
Anion Gap: 10 (ref 4–12)
Bilirubin (Total): 0.7 mg/dL (ref 0.2–1.3)
Calcium: 9.2 mg/dL (ref 8.9–10.2)
Carbon Dioxide, Total: 23 meq/L (ref 22–32)
Chloride: 108 meq/L (ref 98–108)
Creatinine: 0.87 mg/dL (ref 0.38–1.02)
Glucose: 92 mg/dL (ref 62–125)
Potassium: 4.3 meq/L (ref 3.6–5.2)
Protein (Total): 6.6 g/dL (ref 6.0–8.2)
Sodium: 141 meq/L (ref 135–145)
Urea Nitrogen: 19 mg/dL (ref 8–21)
eGFR by CKD-EPI 2021: >60 mL/min/{1.73_m2} (ref >59)

## 2021-08-31 LAB — CBC, DIFF
% Basophils: 1 %
% Eosinophils: 2 %
% Immature Granulocytes: 1 %
% Lymphocytes: 18 %
% Monocytes: 5 %
% Neutrophils: 73 %
% Nucleated RBC: 0 %
Absolute Eosinophil Count: 0.16 10*3/uL (ref 0.00–0.50)
Absolute Lymphocyte Count: 1.56 10*3/uL (ref 1.00–4.80)
Basophils: 0.07 10*3/uL (ref 0.00–0.20)
Hematocrit: 43 % (ref 36.0–45.0)
Hemoglobin: 14 g/dL (ref 11.5–15.5)
Immature Granulocytes: 0.04 10*3/uL (ref 0.00–0.05)
MCH: 29.5 pg (ref 27.3–33.6)
MCHC: 32.5 g/dL (ref 32.2–36.5)
MCV: 91 fL (ref 81–98)
Monocytes: 0.44 10*3/uL (ref 0.00–0.80)
Neutrophils: 6.19 10*3/uL (ref 1.80–7.00)
Nucleated RBC: 0 10*3/uL
Platelet Count: 306 10*3/uL (ref 150–400)
RBC: 4.75 10*6/uL (ref 3.80–5.00)
RDW-CV: 15.2 % — ABNORMAL HIGH (ref 11.0–14.5)
WBC: 8.46 10*3/uL (ref 4.3–10.0)

## 2021-08-31 NOTE — Progress Notes (Signed)
LAB

## 2021-09-01 NOTE — Result Encounter Note (Signed)
Normal labs.

## 2021-09-04 ENCOUNTER — Telehealth (INDEPENDENT_AMBULATORY_CARE_PROVIDER_SITE_OTHER): Payer: Self-pay | Admitting: Family Medicine

## 2021-09-04 DIAGNOSIS — H269 Unspecified cataract: Secondary | ICD-10-CM

## 2021-09-04 NOTE — Telephone Encounter (Signed)
RETURN CALL: Voicemail - Detailed Message      SUBJECT:  Referral Request/Questions - Outgoing     REASON FOR REFERRAL:   Eye Clinic   Follow up for Cataract evaluation  NAME OF CLINIC/SPECIALTY:   [facility]  PROVIDER:   Dr Linda Hedges  PHONE:  [phone]    FAX: [phone]  ADDITIONAL INFORMATION:   Please assist

## 2021-09-05 ENCOUNTER — Encounter (HOSPITAL_BASED_OUTPATIENT_CLINIC_OR_DEPARTMENT_OTHER): Payer: Self-pay | Admitting: Rheumatology

## 2021-09-07 NOTE — Telephone Encounter (Signed)
Referral to eye care pended - patient wants referral for Cataract evaluation     Last PCP visit 11/21/2020  Medicare Annual Wellness

## 2021-10-09 ENCOUNTER — Encounter (INDEPENDENT_AMBULATORY_CARE_PROVIDER_SITE_OTHER): Payer: Self-pay | Admitting: Family Medicine

## 2021-10-09 DIAGNOSIS — D229 Melanocytic nevi, unspecified: Secondary | ICD-10-CM

## 2021-10-12 NOTE — Telephone Encounter (Signed)
Pending referral to [facility] per patient request.   Routing to PCP.

## 2021-10-13 NOTE — Telephone Encounter (Signed)
Referral processed and sent.  Patient informed.

## 2021-10-13 NOTE — Telephone Encounter (Signed)
Order signed.

## 2021-10-15 NOTE — Telephone Encounter (Signed)
Patient responded and stated the reason for the referral is for a total skin check.  Dr. Velvet Bathe already signed off on the referral that was for diagnosis D22.9 - Numerous moles.  Ecare sent to patient letting her know and to let us know if we need to make any changes.

## 2021-10-20 ENCOUNTER — Other Ambulatory Visit (HOSPITAL_BASED_OUTPATIENT_CLINIC_OR_DEPARTMENT_OTHER): Payer: Self-pay | Admitting: Rheumatology

## 2021-10-20 ENCOUNTER — Other Ambulatory Visit (INDEPENDENT_AMBULATORY_CARE_PROVIDER_SITE_OTHER): Payer: Self-pay | Admitting: Family Medicine

## 2021-10-20 DIAGNOSIS — M0579 Rheumatoid arthritis with rheumatoid factor of multiple sites without organ or systems involvement: Secondary | ICD-10-CM

## 2021-10-20 DIAGNOSIS — L900 Lichen sclerosus et atrophicus: Secondary | ICD-10-CM

## 2021-10-21 MED ORDER — FOLIC ACID 1 MG OR TABS
3.0000 mg | ORAL_TABLET | Freq: Every day | ORAL | 3 refills | Status: DC
Start: 2021-10-21 — End: 2021-10-23

## 2021-10-21 MED ORDER — METHOTREXATE SODIUM 2.5 MG OR TABS
ORAL_TABLET | ORAL | 0 refills | Status: DC
Start: 2021-10-21 — End: 2022-01-04

## 2021-10-21 MED ORDER — CLOBETASOL PROPIONATE 0.05 % EX OINT
TOPICAL_OINTMENT | CUTANEOUS | 0 refills | Status: DC
Start: 2021-10-21 — End: 2022-01-04

## 2021-10-23 ENCOUNTER — Other Ambulatory Visit (HOSPITAL_BASED_OUTPATIENT_CLINIC_OR_DEPARTMENT_OTHER): Payer: Self-pay | Admitting: Adult Health

## 2021-10-23 DIAGNOSIS — M0579 Rheumatoid arthritis with rheumatoid factor of multiple sites without organ or systems involvement: Secondary | ICD-10-CM

## 2021-10-23 MED ORDER — FOLIC ACID 1 MG OR TABS
ORAL_TABLET | ORAL | 3 refills | Status: DC
Start: 2021-10-23 — End: 2022-12-09

## 2021-10-24 ENCOUNTER — Encounter (INDEPENDENT_AMBULATORY_CARE_PROVIDER_SITE_OTHER): Payer: Self-pay

## 2021-11-04 ENCOUNTER — Ambulatory Visit (HOSPITAL_BASED_OUTPATIENT_CLINIC_OR_DEPARTMENT_OTHER): Payer: Medicare HMO | Admitting: Rheumatology

## 2021-11-05 ENCOUNTER — Other Ambulatory Visit: Payer: Self-pay

## 2021-11-11 ENCOUNTER — Telehealth (INDEPENDENT_AMBULATORY_CARE_PROVIDER_SITE_OTHER): Payer: Self-pay | Admitting: Critical Care Medicine

## 2021-11-11 ENCOUNTER — Ambulatory Visit: Payer: Medicare HMO | Attending: Rheumatology | Admitting: Rheumatology

## 2021-11-11 VITALS — BP 123/77 | HR 91 | Wt 282.0 lb

## 2021-11-11 DIAGNOSIS — Z79899 Other long term (current) drug therapy: Secondary | ICD-10-CM | POA: Insufficient documentation

## 2021-11-11 DIAGNOSIS — M170 Bilateral primary osteoarthritis of knee: Secondary | ICD-10-CM | POA: Insufficient documentation

## 2021-11-11 DIAGNOSIS — M0579 Rheumatoid arthritis with rheumatoid factor of multiple sites without organ or systems involvement: Secondary | ICD-10-CM | POA: Insufficient documentation

## 2021-11-11 DIAGNOSIS — Z23 Encounter for immunization: Secondary | ICD-10-CM

## 2021-11-11 MED ORDER — ZZCOVID-19 MRNA VACC (MODERNA) 50 MCG/0.5ML IM WRAPPER
50.0000 ug | Freq: Once | INTRAMUSCULAR | Status: AC
Start: 2021-11-11 — End: 2021-11-12
  Administered 2021-11-12: 50 ug via INTRAMUSCULAR

## 2021-11-11 MED ORDER — INFLUENZA VAC A&B SA ADJ QUAD 0.5 ML IM PRSY
0.5000 mL | PREFILLED_SYRINGE | Freq: Once | INTRAMUSCULAR | Status: AC
Start: 2021-11-11 — End: 2021-11-11
  Administered 2021-11-11: 0.5 mL via INTRAMUSCULAR

## 2021-11-11 NOTE — Progress Notes (Signed)
Flu Screening Questions:    1) Currently have a moderate or severe illness, including fever? NO    2) Ever had a serious allergic reaction to eggs?  NO    3) Ever had a serious reaction to a flu vaccine or another vaccine in the past? NO    4) Ever had Guillain-Barre syndrome associated with a vaccine? NO    5) Less than 6 months old? NO    If YES to any of the questions above - NO Flu Vaccine to be given.  Patient may consult provider as needed.    If NO to all questions above - Patient may receive Flu Shot (IM)

## 2021-11-11 NOTE — Progress Notes (Signed)
[address]  [address]  [place]  [address]  TEL: [phone]  l  FAX: [phone]    Follow Up Clinic Note  11/11/2021    IDENTIFYING DATA/CC:    Caitlin Wiley is a very pleasant 71 year old female who presents for follow up of rheumatoid arthritis, osteoarthritis.    RHEUMATOLOGIC HISTORY OF PRESENT ILLNESS:  Diagnosis: RA, OA; initial contult 11/30/2017  Presenting Symptoms/Timeline: 2013 joint pain hands, wrists, knees, shoulders  Autoimmune Serologies: RF 140, CCP 16; CRP 17, ESR 23; UA 4.6  Pertinent Radiology: Xrays of hands, wrists, knees, feet: No erosions, consistent with OA, chondrocalcinosis  Medications Used: MXT 11/2017  Significant Flares:  Immunization Status: Influenza, PCV13, PPSV23, Zoster  Infectious Disease Screening: Hepatitis B NR, Hepatitis C, TB quantiferon NR  Other: Afib on chronic anticoag,     INTERVAL HISTORY:  Patient seen today for follow-up of rheumatoid arthritis and osteoarthritis.  She was last seen 4 months ago at which time she had stable disease.  Her main complaint that day was bilateral CMC joint pain.  Her knees and lower back also were bothersome.  This was partly exacerbated by working in the garden.  She was also concerned at that time due to facial rash that developed after applying sunscreen.  The rash did not develop with sun exposure but after sunscreen.  Patient is on methotrexate for treatment of her inflammatory arthritis.    Patient reports doing well.  No significant changes in health.  She does report numbness and tingling along the left fifth digit.  This is most notable when she bends her elbow or her wrist.  She has 5 to 10 minutes of morning stiffness.  No significant joint swelling.  Her main complaint is bilateral knee pain.  This is activity limiting.  She continues to tolerate methotrexate without significant side effects.  No serious infectious complications.  No new rash since throwing out the lotion with sunscreen  that cause rash earlier in the season.    REVIEW OF SYSTEMS:  Complete ROS is negative, except for those mentioned above in the Interval History    PROBLEM LIST:  Patient Active Problem List    Diagnosis Date Noted    Immunosuppression (HCC) [D84.9] 11/20/2020    Lichen sclerosus [L90.0] 04/30/2020    Postmenopausal bleeding [N95.0] 01/10/2020    Cervical polyp [N84.1] 01/10/2020    Vulvar lesion [N90.89] 01/10/2020    Rheumatoid arthritis involving multiple sites with positive rheumatoid factor (HCC) [M05.79] 02/08/2018    Primary osteoarthritis of both knees [M17.0] 09/12/2017    Chronic anticoagulation [Z79.01] 08/02/2015    Right cervical radiculopathy [M54.12] 05/30/2015    Morbid obesity (HCC) [E66.01] 05/30/2015    Paroxysmal atrial fibrillation (HCC) [I48.0] 04/17/2014     A. S/p ablation ([facility] PVI) 10/19/2016      GERD (gastroesophageal reflux disease) [K21.9] 11/08/2012    OA (osteoarthritis) [M19.90] 07/28/2012    Asthma [J45.909] 07/24/2012    Edema [R60.9] 07/24/2012    Sleep apnea [G47.30] 07/24/2012    Pulmonary nodule [R91.1] 07/24/2012    Grief reaction [F43.21] 07/24/2012     05/11/2011         SOCIAL/FAMILY HISTORY:  Reviewed with patient. No changes from what is currently noted in EPIC    ALLERGIES:  Review of patient's allergies indicates:  Allergies   Allergen Reactions    Bee Venom Anaphylaxis    Ciprofloxacin Other     Joint aching      Pneumococcal Vaccines Skin: Hives    Sulfa Antibiotics Skin: Hives    Cats [Animals] Skin: Itching    Dust Mite Extract Skin: Itching     DUST    Pollen Extract Skin: Itching       MEDICATIONS:  Current Outpatient Medications   Medication Sig Dispense Refill    albuterol HFA 108 (90 Base) MCG/ACT inhaler inhale 1 to 2 puffs by mouth every 6 hours if needed for shortness of breath or wheezing 8.5 g 3    apixaban (Eliquis) 5 MG tablet Take 1 tablet (5 mg) by mouth 2 times a day. 180 tablet 3    Cholecalciferol (VITAMIN D3 OR) Take by mouth.      clobetasol 0.05 %  ointment apply topically to VULVAR AREA THREE TIMES WEEKLY 60 g 0    famotidine 20 MG tablet Take 1 tablet (20 mg) by mouth 2 times a day. 180 tablet 3    Flaxseed, Linseed, (FLAXSEED OIL) 1200 MG Oral Cap None Entered      folic acid 1 MG tablet take 3 tablets by mouth once daily 270 tablet 3    GLUCOSAMINE CHONDROITIN COMPLX OR       Magnesium 400 MG Oral Cap Take 1 capsule (400 mg) by mouth.      methotrexate 2.5 MG tablet take 8 tablets by mouth EVERY 7 DAYS 96 tablet 0    montelukast 10 MG tablet Take 1 tablet (10 mg) by mouth daily. 90 tablet 3    Multiple Vitamins-Minerals (MULTIVITAMIN OR) no iron      Probiotic Product (PROBIOTIC DAILY OR)       SIMETHICONE-80 OR Take 2-3 tablets by mouth daily as needed.      triamterene-hydroCHLOROthiazide 37.5-25 MG tablet Take 1 tablet by mouth daily. 90 tablet 3     No current facility-administered medications for this visit.       PHYSICAL EXAMINATION:  Vital signs:  BP 123/77   Pulse 91   Wt (!) 127.9 kg (282 lb)   LMP  (LMP Unknown)   SpO2 95%   BMI 46.93 kg/m   General:  Awake, alert, and oriented, no apparent distress, pleasant, and cooperative  Psychologic:  Mood is euthymic, affect is congruent  EYES: Anicteric sclera, no conjunctival injection  ENT:  Normocephalic, atraumatic, moist membranes, no oral ulcers   Pulmonary:  Non-labored breathing, no wheezes, rhonchi, or rales  Cardiovascular:  RRR, no murmurs, rubs or gallops  Skin:  Normal temperature and texture, No visible rashes, ulcers, or nodules  Neurologic:  Light touch sensation is grossly intact. Face symmetric   Musculoskeletal:    Gait: Normal  Joints: No evidence of synovitis, joint swelling, or tenderness in the joints.  Nails: No pitting, clubbing, or cyanosis noted in the fingernails bilaterally   Muscle strength: Strength is grossly 5 out of 5 throughout the bilateral upper and lower extremities    LABS:    Pending    RADIOLOGY:   None    DISEASE ACTIVITY:  Patient Function: 2  Patient Pain  Assessment: 3  Patient Global Assessment: 3.5   Combined Rapid 3 Score: 8.5    ASSESSMENT AND PLAN:  Caitlin Wiley is a very pleasant 72 year old female who presents for follow up of rheumatoid arthritis, osteoarthritis.    #. Rheumatoid arthritis. Seropositive, nonerosive RA. Doing much better since MTX.  No evidence of synovitis on exam today. Knee pain, CMC joint pain sounds mechanical in nature and likely related to  osteoarthritis.  We previously discussed therapeutic options including corticosteroid or hyaluronic acid derivative injection in the future.  She is a candidate for total knee replacement surgery once she is able to reach her target BMI.  Using Noon.     Stable disease. She is tolerating methotrexate without significant side effects including no significant infectious complications.      - continue methotrexate 20mg once a week   - continue folic acid 2mg daily     #. High risk medication. MTX toxicity labs today.     #. Osteoarthritis. Most bothersome in the knees and is activity related. She is interested in TKR but will need to lose some weight first.      Weight is stable.     #. Left cubital tunnel neuropathy. Discussed conservative management.     #. Immunization. She is due for tetanus and shingles vaccine. She will get flu vaccine in clinic today. She plans on getting updated COVID vaccine later this week. She will hold MTX dose on Saturday.     #. Bone health. Last DEXA from 2017 was normal. DEXA 06/2018 normal.      - optimization of calcium and vitamin D   - daily weight bearing exercises       All the patient's questions were answered. The patient will follow up in 4 months.    Dictated using speech recognition technology and electronically signed by  , MD   Note: This document may contain errors inherent to this technology. If you find any errors that might affect patient care, please contact our office as soon as possible.   [address], [address],  [place], [place] [address]  Ph. [phone] Fx. [phone]

## 2021-11-11 NOTE — Patient Instructions (Addendum)
It was a pleasure seeing you in clinic today.    Continue your current medications  Get labs after your appointment today  Flu vaccine and COVID vaccine. Hold methotrexate for one week after the vaccine.  Follow up in 4 months    If you have any questions or concerns before your next appointment, please do not hesitate to contact me. Our clinic phone number is [phone] and I am also available via eCare.

## 2021-11-11 NOTE — Telephone Encounter (Signed)
Please Sign for Covid vaccine if appropriate  Pt is coming in clinical on 10/26

## 2021-11-12 ENCOUNTER — Ambulatory Visit (INDEPENDENT_AMBULATORY_CARE_PROVIDER_SITE_OTHER): Payer: Medicare HMO

## 2021-11-12 ENCOUNTER — Telehealth (INDEPENDENT_AMBULATORY_CARE_PROVIDER_SITE_OTHER): Payer: Self-pay | Admitting: Family Medicine

## 2021-11-12 ENCOUNTER — Other Ambulatory Visit (INDEPENDENT_AMBULATORY_CARE_PROVIDER_SITE_OTHER): Payer: Medicare HMO

## 2021-11-12 ENCOUNTER — Ambulatory Visit
Admission: RE | Admit: 2021-11-12 | Discharge: 2021-11-12 | Disposition: A | Discharge status: Home / Self Care | Payer: Medicare HMO | Source: Ambulatory Visit | Attending: Rheumatology | Admitting: Rheumatology

## 2021-11-12 DIAGNOSIS — Z13228 Encounter for screening for other metabolic disorders: Secondary | ICD-10-CM

## 2021-11-12 DIAGNOSIS — Z23 Encounter for immunization: Secondary | ICD-10-CM

## 2021-11-12 DIAGNOSIS — M0579 Rheumatoid arthritis with rheumatoid factor of multiple sites without organ or systems involvement: Secondary | ICD-10-CM | POA: Insufficient documentation

## 2021-11-12 DIAGNOSIS — Z79899 Other long term (current) drug therapy: Secondary | ICD-10-CM | POA: Insufficient documentation

## 2021-11-12 LAB — CBC, DIFF
% Basophils: 1 %
% Eosinophils: 1 %
% Immature Granulocytes: 1 %
% Lymphocytes: 14 %
% Monocytes: 5 %
% Neutrophils: 78 %
% Nucleated RBC: 0 %
Absolute Eosinophil Count: 0.15 10*3/uL (ref 0.00–0.50)
Absolute Lymphocyte Count: 1.51 10*3/uL (ref 1.00–4.80)
Basophils: 0.08 10*3/uL (ref 0.00–0.20)
Hematocrit: 41 % (ref 36.0–45.0)
Hemoglobin: 13.8 g/dL (ref 11.5–15.5)
Immature Granulocytes: 0.07 10*3/uL — ABNORMAL HIGH (ref 0.00–0.05)
MCH: 29.4 pg (ref 27.3–33.6)
MCHC: 33.7 g/dL (ref 32.2–36.5)
MCV: 87 fL (ref 81–98)
Monocytes: 0.54 10*3/uL (ref 0.00–0.80)
Neutrophils: 8.37 10*3/uL — ABNORMAL HIGH (ref 1.80–7.00)
Nucleated RBC: 0 10*3/uL
Platelet Count: 301 10*3/uL (ref 150–400)
RBC: 4.69 10*6/uL (ref 3.80–5.00)
RDW-CV: 14.9 % — ABNORMAL HIGH (ref 11.0–14.5)
WBC: 10.72 10*3/uL — ABNORMAL HIGH (ref 4.3–10.0)

## 2021-11-12 LAB — COMPREHENSIVE METABOLIC PANEL
ALT (GPT): 14 U/L (ref 7–33)
AST (GOT): 16 U/L (ref 9–38)
Albumin: 4.1 g/dL (ref 3.5–5.2)
Alkaline Phosphatase (Total): 75 U/L (ref 38–172)
Anion Gap: 11 (ref 4–12)
Bilirubin (Total): 0.7 mg/dL (ref 0.2–1.3)
Calcium: 9 mg/dL (ref 8.9–10.2)
Carbon Dioxide, Total: 23 meq/L (ref 22–32)
Chloride: 101 meq/L (ref 98–108)
Creatinine: 0.83 mg/dL (ref 0.38–1.02)
Glucose: 135 mg/dL — ABNORMAL HIGH (ref 62–125)
Potassium: 3.6 meq/L (ref 3.6–5.2)
Protein (Total): 6.5 g/dL (ref 6.0–8.2)
Sodium: 135 meq/L (ref 135–145)
Urea Nitrogen: 19 mg/dL (ref 8–21)
eGFR by CKD-EPI 2021: >60 mL/min/{1.73_m2} (ref >59)

## 2021-11-12 LAB — C_REACTIVE PROTEIN: C_Reactive Protein: 10.2 mg/L — ABNORMAL HIGH (ref 0.0–10.0)

## 2021-11-12 NOTE — Telephone Encounter (Signed)
Patient calling because they are coming in for a Covid shot this morning and would like to get labs done while at [facility] clinic. Advised patient that an appointment is recommended, but not required. Patient stated that they will walk in prior to appointment to get labs done.     Confirmed labs are ordered.    Nothing further needed, closing.

## 2021-11-12 NOTE — Progress Notes (Signed)
Vaccine Screening Questions    Interpreter used: No    Vaccine(s) to be administered: Covid  Relevant VIS or EUA was offered and declined by patient today      1. Are you allergic to Latex? NO    2.  Have you had a serious reaction or an allergic reaction to a vaccine?  NO    3.  Currently have a moderate or severe illness, including fever?  NO    4.  Ever had a seizure or any neurological problem associated with a vaccine? (DTaP/TDaP/DTP pertinent) NO    5.  Is patient receiving any live vaccinations today? (Varicella-Chickenpox, MMR-Measles/Mumps/Rubella, Zostavax, Flumist, Yellow Fever) NOTE: oral rotavirus is exempt  NO    If YES to any of the questions above - Do NOT give vaccine.  Consult with RN or provider in clinic.  (#5 can be YES if all Live vaccine questions are answered NO)    If NO to all questions above - Patient may receive vaccine.    6.  Do you need to receive the Flu vaccine today? NO    7. Do you need to receive Covid-19 vaccine today? YES:    Covid Pre-Vaccination Screening    Are you feeling sick today? NO  Have you ever received a dose of COVID 19 vaccine? YES  If yes, which vaccine have you received last? Brave  When was your last COVID vaccine dose? 715/2023  Have you ever had a severe allergic reaction (e.g. anaphylaxis) to anything? For example, a reaction for which you were treated with epinephrine (EpiPen) or for which you had to go to the hospital? NO  Was the severe allergic reaction after receiving a COVID 19 vaccine? N/A  Was the severe allergic reaction after receiving another vaccine or another injectable medication? N/A  Do you have a physical vaccination card (compare with electronic record)? YES  Do you give consent today to receive a COVID 19 vaccination? YES        MAR Documentation:    Medication Administrations This Visit         COVID-19 Moderna mRNA vaccine (Spikevax) injection 50 mcg Admin Date  11/12/2021  10:56 Action  Given Dose  50 mcg Route  Intramuscular  Site  Right Deltoid Administered By  Gaspar Cola, [place]    Ordering Provider: Oliver Barre    NDC: 03704-888-91    Lot#: [id number]                Vaccine given today without initial adverse effect. YES  All patients are encouraged to wait 15 minutes before leaving after receiving any vaccine.    Allayne Gitelman, CMA

## 2021-11-12 NOTE — Progress Notes (Signed)
labs

## 2021-11-13 NOTE — Result Encounter Note (Signed)
Labs stable

## 2021-12-17 ENCOUNTER — Encounter (HOSPITAL_BASED_OUTPATIENT_CLINIC_OR_DEPARTMENT_OTHER): Payer: Self-pay | Admitting: Cardiovascular Disease

## 2021-12-17 NOTE — Telephone Encounter (Signed)
OV note by Dr Nicole Kindred on 03/31/2021:    A/P:  (I48.0) Paroxysmal atrial fibrillation ([place])  (primary encounter diagnosis)  She describes both premature beats and rare episodes of atrial fibrillation.  She does not feel her symptoms warrant antiarrhythmic drug therapy, and we agreed to continue observation.  She will continue to use CPAP regularly.  Plan: Observation and long-term anticoagulation     (I10) Essential hypertension  Blood pressure well controlled, she will continue to monitor.  Electrolytes and renal function normal on her current diuretic therapy.  Plan: Continue to monitor blood pressure

## 2021-12-24 ENCOUNTER — Ambulatory Visit (INDEPENDENT_AMBULATORY_CARE_PROVIDER_SITE_OTHER): Payer: Medicare HMO | Admitting: Family Medicine

## 2021-12-24 ENCOUNTER — Emergency Department: Payer: Self-pay

## 2021-12-24 VITALS — BP 146/90 | HR 100 | Temp 98.0°F | Resp 17

## 2021-12-24 DIAGNOSIS — S0083XA Contusion of other part of head, initial encounter: Secondary | ICD-10-CM

## 2021-12-24 DIAGNOSIS — Z7901 Long term (current) use of anticoagulants: Secondary | ICD-10-CM

## 2021-12-24 NOTE — Progress Notes (Signed)
[facility] MEDICINE URGENT CARE AT [facility]    Triage      Caitlin Wiley is a 71 year old female presenting to Urgent Care.    Chief complaint: hit head on ground, on blood thinner      Brief History:  Says she has tripped and fell on the ground.  Hit head.  On blood thinner.  No loss on consciousness      Exam:  BP (!) 146/90   Pulse 100   Temp 36.7 C   Resp 17   LMP  (LMP Unknown)   SpO2 96%   Abrasions of L side of forehead, face    Assessment/Plan:  Encounter Diagnoses   Name Primary?    Contusion of other part of head, initial encounter Yes    Anticoagulated     Sent to the ER    Transported via Private Vehicle   Patient sent to the ER for further evaluation and treatment.  Offered and declined ambulance. Signed release form.  Did advised to ambulance transport or someone to drive her but declines. Understands the risks.

## 2021-12-29 ENCOUNTER — Encounter (INDEPENDENT_AMBULATORY_CARE_PROVIDER_SITE_OTHER): Payer: Self-pay | Admitting: Family Medicine

## 2021-12-29 ENCOUNTER — Other Ambulatory Visit: Payer: Self-pay

## 2021-12-29 NOTE — Telephone Encounter (Signed)
Chart visit in care everywhere   Tdap updated

## 2022-01-01 ENCOUNTER — Other Ambulatory Visit (HOSPITAL_BASED_OUTPATIENT_CLINIC_OR_DEPARTMENT_OTHER): Payer: Self-pay | Admitting: Rheumatology

## 2022-01-01 ENCOUNTER — Other Ambulatory Visit (INDEPENDENT_AMBULATORY_CARE_PROVIDER_SITE_OTHER): Payer: Self-pay | Admitting: Family Medicine

## 2022-01-01 DIAGNOSIS — K219 Gastro-esophageal reflux disease without esophagitis: Secondary | ICD-10-CM

## 2022-01-01 DIAGNOSIS — J4520 Mild intermittent asthma, uncomplicated: Secondary | ICD-10-CM

## 2022-01-01 DIAGNOSIS — I480 Paroxysmal atrial fibrillation: Secondary | ICD-10-CM

## 2022-01-01 DIAGNOSIS — L900 Lichen sclerosus et atrophicus: Secondary | ICD-10-CM

## 2022-01-01 DIAGNOSIS — M0579 Rheumatoid arthritis with rheumatoid factor of multiple sites without organ or systems involvement: Secondary | ICD-10-CM

## 2022-01-01 DIAGNOSIS — R600 Localized edema: Secondary | ICD-10-CM

## 2022-01-04 MED ORDER — METHOTREXATE SODIUM 2.5 MG OR TABS
ORAL_TABLET | ORAL | 0 refills | Status: DC
Start: 2022-01-04 — End: 2022-03-22

## 2022-01-04 MED ORDER — TRIAMTERENE-HCTZ 37.5-25 MG OR TABS
1.0000 | ORAL_TABLET | Freq: Every day | ORAL | 0 refills | Status: DC
Start: 2022-01-04 — End: 2022-02-02

## 2022-01-04 MED ORDER — FAMOTIDINE 20 MG OR TABS
20.0000 mg | ORAL_TABLET | Freq: Two times a day (BID) | ORAL | 0 refills | Status: DC
Start: 2022-01-04 — End: 2022-02-02

## 2022-01-04 MED ORDER — ELIQUIS 5 MG OR TABS
5.0000 mg | ORAL_TABLET | Freq: Two times a day (BID) | ORAL | 0 refills | Status: DC
Start: 2022-01-04 — End: 2022-02-02

## 2022-01-04 MED ORDER — MONTELUKAST SODIUM 10 MG OR TABS
10.0000 mg | ORAL_TABLET | Freq: Every day | ORAL | 0 refills | Status: DC
Start: 2022-01-04 — End: 2022-02-02

## 2022-01-04 MED ORDER — CLOBETASOL PROPIONATE 0.05 % EX OINT
TOPICAL_OINTMENT | CUTANEOUS | 0 refills | Status: DC
Start: 2022-01-04 — End: 2022-02-02

## 2022-01-04 NOTE — Telephone Encounter (Signed)
Spoke to patient and informed that she will call the clinic back to schedule.

## 2022-01-04 NOTE — Telephone Encounter (Signed)
Patient is due for yearly exam/lab. Last visit 11/21/2020.    Please schedule follow up visit. Thank you for your assistance.

## 2022-01-05 ENCOUNTER — Telehealth (INDEPENDENT_AMBULATORY_CARE_PROVIDER_SITE_OTHER): Payer: Self-pay | Admitting: Family Medicine

## 2022-01-05 DIAGNOSIS — L900 Lichen sclerosus et atrophicus: Secondary | ICD-10-CM

## 2022-01-05 DIAGNOSIS — K219 Gastro-esophageal reflux disease without esophagitis: Secondary | ICD-10-CM

## 2022-01-05 DIAGNOSIS — I480 Paroxysmal atrial fibrillation: Secondary | ICD-10-CM

## 2022-01-05 DIAGNOSIS — J4520 Mild intermittent asthma, uncomplicated: Secondary | ICD-10-CM

## 2022-01-05 DIAGNOSIS — R600 Localized edema: Secondary | ICD-10-CM

## 2022-01-05 NOTE — Telephone Encounter (Addendum)
Patient called frustrated because her medications were sent recently for a 30 day supply with no warning and now she has to pay a higher deductible for such a small qty     Patient has an appointment on 1/16 and would like to know if PCP can send her yearly RX's to the pharmacy as PCP usually does after her yearly appointment

## 2022-01-05 NOTE — Telephone Encounter (Signed)
Will address at upcoming appointment

## 2022-02-02 ENCOUNTER — Ambulatory Visit (INDEPENDENT_AMBULATORY_CARE_PROVIDER_SITE_OTHER): Payer: Medicare HMO | Admitting: Family Medicine

## 2022-02-02 VITALS — BP 117/67 | HR 70 | Temp 98.8°F | Wt 284.0 lb

## 2022-02-02 DIAGNOSIS — Z0000 Encounter for general adult medical examination without abnormal findings: Secondary | ICD-10-CM

## 2022-02-02 DIAGNOSIS — L900 Lichen sclerosus et atrophicus: Secondary | ICD-10-CM

## 2022-02-02 DIAGNOSIS — D849 Immunodeficiency, unspecified: Secondary | ICD-10-CM

## 2022-02-02 DIAGNOSIS — Z6841 Body Mass Index (BMI) 40.0 and over, adult: Secondary | ICD-10-CM

## 2022-02-02 DIAGNOSIS — R600 Localized edema: Secondary | ICD-10-CM

## 2022-02-02 DIAGNOSIS — I480 Paroxysmal atrial fibrillation: Secondary | ICD-10-CM

## 2022-02-02 DIAGNOSIS — J4520 Mild intermittent asthma, uncomplicated: Secondary | ICD-10-CM

## 2022-02-02 DIAGNOSIS — K219 Gastro-esophageal reflux disease without esophagitis: Secondary | ICD-10-CM

## 2022-02-02 DIAGNOSIS — M0579 Rheumatoid arthritis with rheumatoid factor of multiple sites without organ or systems involvement: Secondary | ICD-10-CM

## 2022-02-02 MED ORDER — APIXABAN 5 MG OR TABS
5.0000 mg | ORAL_TABLET | Freq: Two times a day (BID) | ORAL | 3 refills | Status: DC
Start: 2022-02-02 — End: 2022-12-08

## 2022-02-02 MED ORDER — ALBUTEROL SULFATE HFA 108 (90 BASE) MCG/ACT IN AERS
INHALATION_SPRAY | RESPIRATORY_TRACT | 3 refills | Status: DC
Start: 2022-02-02 — End: 2023-01-27

## 2022-02-02 MED ORDER — MONTELUKAST SODIUM 10 MG OR TABS
10.0000 mg | ORAL_TABLET | Freq: Every day | ORAL | 3 refills | Status: DC
Start: 2022-02-02 — End: 2022-12-08

## 2022-02-02 MED ORDER — TRIAMTERENE-HCTZ 37.5-25 MG OR TABS
1.0000 | ORAL_TABLET | Freq: Every day | ORAL | 3 refills | Status: DC
Start: 2022-02-02 — End: 2022-12-08

## 2022-02-02 MED ORDER — CLOBETASOL PROPIONATE 0.05 % EX OINT
TOPICAL_OINTMENT | CUTANEOUS | 3 refills | Status: DC
Start: 2022-02-02 — End: 2022-12-08

## 2022-02-02 MED ORDER — FAMOTIDINE 20 MG OR TABS
20.0000 mg | ORAL_TABLET | Freq: Two times a day (BID) | ORAL | 3 refills | Status: DC
Start: 2022-02-02 — End: 2022-12-08

## 2022-02-02 NOTE — Progress Notes (Signed)
Visit: pt is here for a wellness. Pt a numbness feeling in the back of her head. Pt wanted to see is she needs to repeat artery studies and Dexa scan. Pt states that she has fullness feeling in her upper abdomen.     Refills? YES  Referral? NO  Letter or Form? NO  Lab Results? NO    HEALTH MAINTENANCE:  Has the patient has this done since their last visit?  Cervical screening/PAP: N/A  Mammo: N/A  Colon Screen: N/A  Diabetic Eye Exam (If applicable): N/A    Have you seen a specialist since your last visit:  YES     Vaccines Due? NO     Does patient have eCare?  YES     HM Due:   Health Maintenance   Topic Date Due    Zoster Vaccine (1 of 2) Never done    Colorectal Cancer Screening  04/02/2021    Depression Screening (PHQ-2)  11/21/2021    Medicare Annual Wellness Visit  11/21/2021    COVID-19 Vaccine (8 - 2023-24 season) 01/07/2022    Breast Cancer Screening  08/28/2023    Diabetes Screening  11/12/2024    Lipid Disorders Screening  11/21/2025    DTaP, Tdap and Td Vaccines (4 - Td or Tdap) 12/25/2031    Pneumococcal Vaccine: 65+ years  Completed    Influenza Vaccine  Completed    Hepatitis C Screening  Completed    Hepatitis A Vaccine  Aged Out    HPV Vaccine  Aged Out    Osteoporosis Screening  Discontinued    Hepatitis B Vaccine  Discontinued       PCP Verified?  Yes, [place], Hanley Seamen, MD

## 2022-02-02 NOTE — Progress Notes (Signed)
ANNUAL WELLNESS VISIT        Caitlin Wiley is a 72 year old female who presents for a subsequent Annual Wellness Visit.    Is this a Telemedicine or a Phone visit?  No       INFORMATION GATHERING:      The following areas were confirmed with patient/caregiver and/or updated in Epic at this visit:   Past Medical, Surgical, Family, and Social History  Current medications and supplements  Allergies  All of the above components have been reviewed and updated: yes    Opioid Use:   Patient not taking opioids       I have not reviewed the patient's risk factors for other substance use disorders.  If appropriate, I've referred the patient for treatment.      Please see today's HRA for review of patient's functional ability and level of safety. Concerns are addressed below in the Personalized Prevention Plan section.    For list of current providers and suppliers, see Care Team Section, EHR encounters, and/or HRA questionnaire for [facility] Medicine providers involved in care    Other providers and suppliers outside [place]:   No outside providers identified      Depression screening:    PHQ-2: 0  PHQ-9 (if done):       EXAM:  BP 117/67   Pulse 70   Temp 37.1 C (Temporal)   Wt (!) 128.8 kg (284 lb)   LMP  (LMP Unknown)   SpO2 96%   BMI 47.26 kg/m         GAIT:   Abnormal as assessed by direct observation (describe): uses walker    COGNITION:       Intact          ADVANCE CARE PLANNING (ACP)       Advance care planning:  Declined.    No advance directive discussion during this visit    ASSESSMENT:       Caitlin Wiley was seen for her Annual Wellness Visit, including identification of risk factors & conditions that may affect her health and function in the future.         (Z00.00) Encounter for Medicare annual wellness exam  (primary encounter diagnosis)  Plan:         Actions at this visit:      Established or updated a written schedule of screening and prevention measures recommended and appropriate  for Caitlin Wiley for the next 5-10 years  Established or updated a list of her risk factors and conditions for which lifestyle or medical interventions are recommended or underway, including mental health risks and conditions, and including risks/benefits of treatment  Furnished personalized health advice and, as appropriate, referrals to health education or preventive counseling services or programs (such as fall prevention, tobacco cessation, physical activity, nutrition, cognition, weight loss)    All of the above components have been reviewed and updated. yes    Personalized Prevention Plan:      It is very important to maintain a healthy lifestyle.  This begins with eating a balanced diet that includes daily servings of fruit, vegetables and whole grains.  It is much healthier to prepare your own meals.  Try to avoid fast food and sugared drinks.  Maintaining an active lifestyle is also important.  This includes 3-4 days of an exercise that increases your heart rate (cardio) for at least 20-30 minutes.  Examples of cardiovascular exercise are walking, jogging, swimming, biking.  Stretching exercises such as yoga and working with weights can also have great benefit.     Specific health issues that are important for you to manage are:    Atrial Fibrillation  Rheumatoid Arthritis    Here are screening & prevention measures recommended for you:  Health Maintenance   Topic Date Due    Zoster Vaccine (1 of 2) Never done    Colorectal Cancer Screening  04/02/2021    Medicare Annual Wellness Visit  11/21/2021    COVID-19 Vaccine (8 - 2023-24 season) 01/07/2022    Depression Screening (PHQ-2)  02/03/2023    Breast Cancer Screening  08/28/2023    Diabetes Screening  11/12/2024    Lipid Disorders Screening  11/21/2025    DTaP, Tdap and Td Vaccines (4 - Td or Tdap) 12/25/2031    Pneumococcal Vaccine: 65+ years  Completed    Influenza Vaccine  Completed    Hepatitis C Screening  Completed    Hepatitis A Vaccine   Aged Out    HPV Vaccine  Aged Out    Osteoporosis Screening  Discontinued    Hepatitis B Vaccine  Discontinued       Please plan to have a Subsequent Annual Wellness Visit in 1 year.    ================================================================    HPI  [name] is a 72 year old female who presents for Medicare Wellness, but needs refills on her chronic medications.     She currently uses albuterol and montelukast for asthma.  Denies shortness of breath or cough.    Needs refill of apixaban for Afib.  Denies chest pain.    Needs refill of clobetasol for lichen sclerosis.  Well controlled.    Needs refill of famotidine.  Denies abdominal pain currently.      Continues HCTZ for edema which is unchanged.       Review of patient's allergies indicates:  Allergies   Allergen Reactions    Bee Venom Anaphylaxis    Ciprofloxacin Other     Joint aching    Pneumococcal Vaccines Skin: Hives    Sulfa Antibiotics Skin: Hives    Cats [Animals] Skin: Itching    Dust Mite Extract Skin: Itching     DUST    Pollen Extract Skin: Itching         Social History     Tobacco Use    Smoking status: Never    Smokeless tobacco: Never   Substance Use Topics    Alcohol use: No     Alcohol/week: 0.0 standard drinks of alcohol     Comment: rare              Physical Exam  BP 117/67   Pulse 70   Temp 37.1 C (Temporal)   Wt (!) 128.8 kg (284 lb)   LMP  (LMP Unknown)   SpO2 96%   BMI 47.26 kg/m   General:  alert, cooperative, no acute distress  Lung:  clear to auscultation bilaterally  Heart:  regular rate and rythmn, no murmurs  Extremity exam:  warm, pink, 2+ edema, pulses present       Assesment/Plan  (J45.20) Mild intermittent asthma without complication  Plan: albuterol HFA 108 (90 Base) MCG/ACT inhaler,         montelukast 10 MG tablet        Stable.  Refill as needed.    (I48.0) Paroxysmal atrial fibrillation (HCC)  Plan: apixaban (Eliquis) 5 MG tablet        Cardiac exam  stable.  Refill as  needed.    (X21.5)  Lichen sclerosus  Plan: clobetasol 0.05 % ointment            (K21.9) Gastroesophageal reflux disease without esophagitis  Plan: famotidine 20 MG tablet            (R60.0) Edema of both legs  Plan: triamterene-hydroCHLOROthiazide 37.5-25 MG         tablet            (D84.9) Immunosuppression (HCC)  (M05.79) Rheumatoid arthritis involving multiple sites with positive rheumatoid factor ([place])  Plan: Continues management with rheumatology.    (E66.01) Morbid obesity ([place])  Plan: BMI 47.

## 2022-02-02 NOTE — Patient Instructions (Signed)
It is very important to maintain a healthy lifestyle.  This begins with eating a balanced diet that includes daily servings of fruit, vegetables and whole grains.  It is much healthier to prepare your own meals.  Try to avoid fast food and sugared drinks.  Maintaining an active lifestyle is also important.  This includes 3-4 days of an exercise that increases your heart rate (cardio) for at least 20-30 minutes.  Examples of cardiovascular exercise are walking, jogging, swimming, biking.  Stretching exercises such as yoga and working with weights can also have great benefit.     Specific health issues that are important for you to manage are:    Atrial Fibrillation  Rheumatoid Arthritis

## 2022-02-03 IMAGING — DX HIP BILATERAL WITH PELVIS 5 VIEWS
5 series · 5 of 5 positions shown · non-contrast
Comparison: None.

________________________________________________________________________________________________ 
HIP BILATERAL WITH PELVIS 5 VIEWS, 02/03/2022 [DATE]: 
CLINICAL INDICATION: Bilateral hip pain..

[AP (1 of 3)]
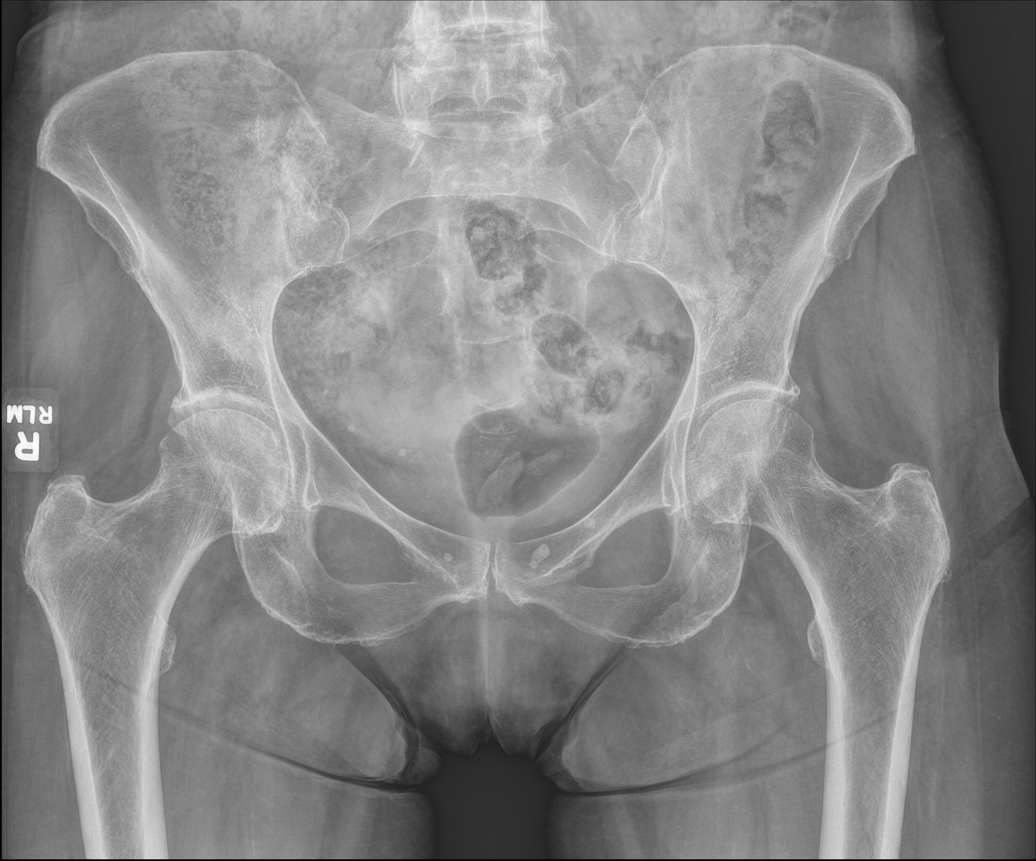

[AP (2 of 3)]
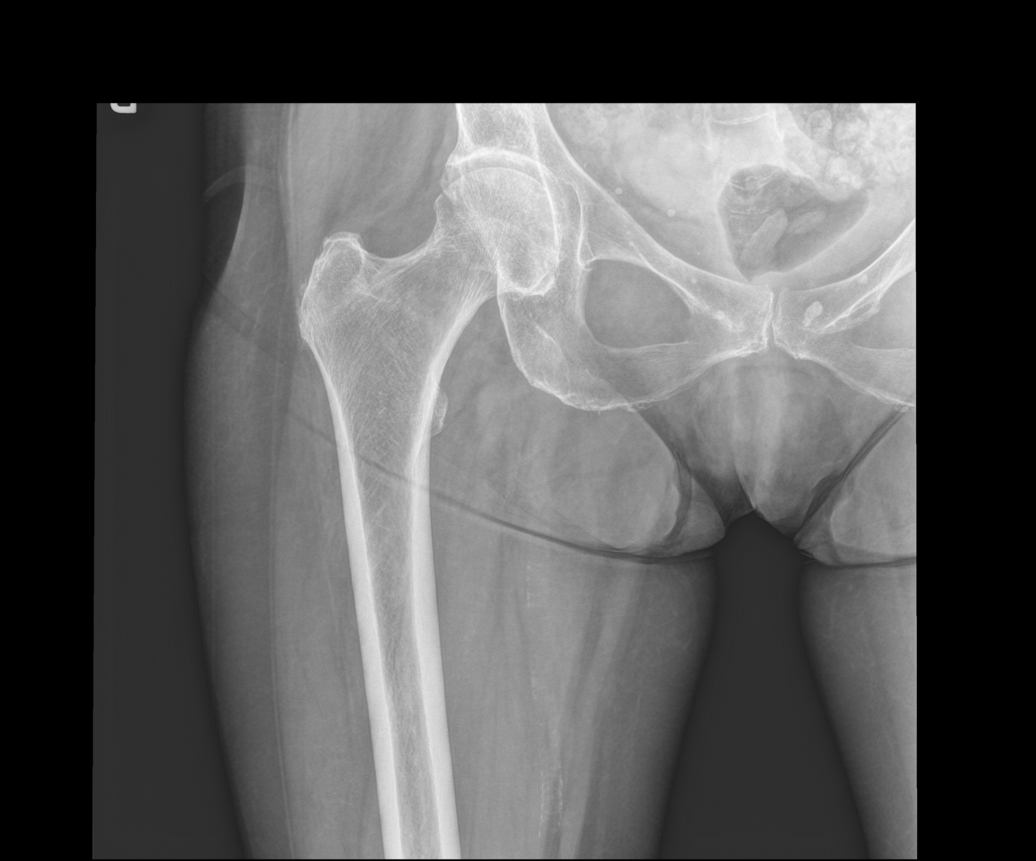

[AP (3 of 3)]
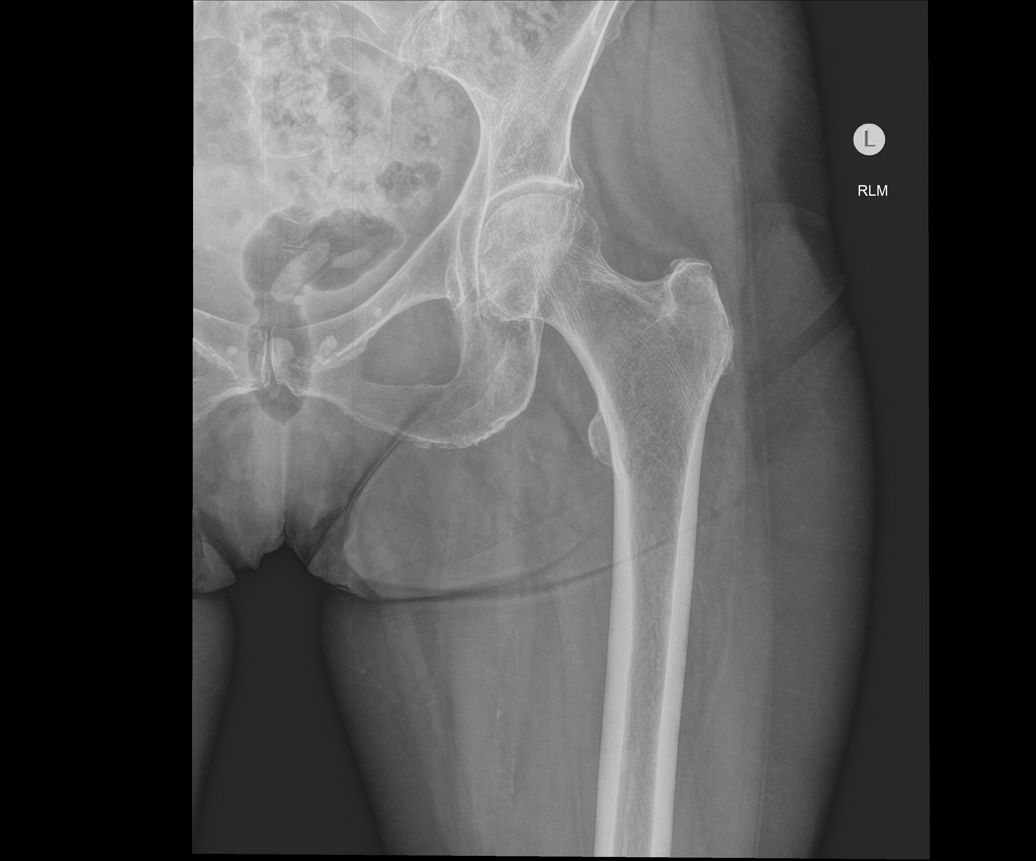

[lateral (1 of 2)]
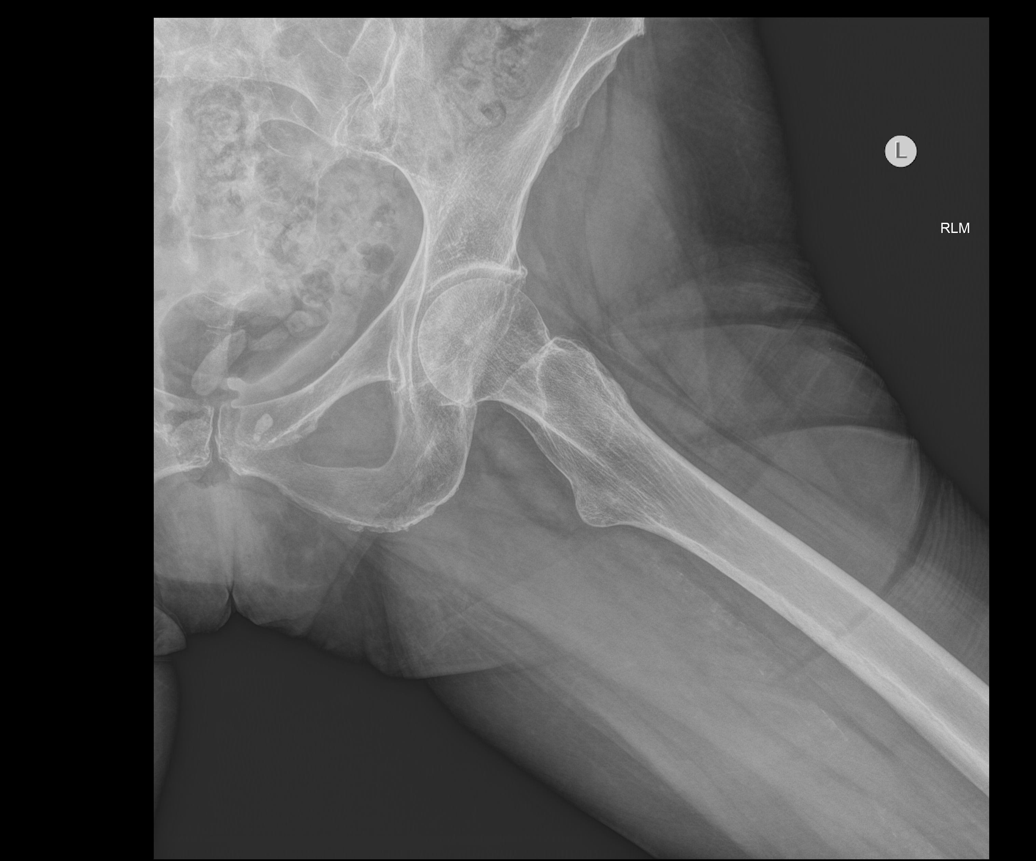

[lateral (2 of 2)]
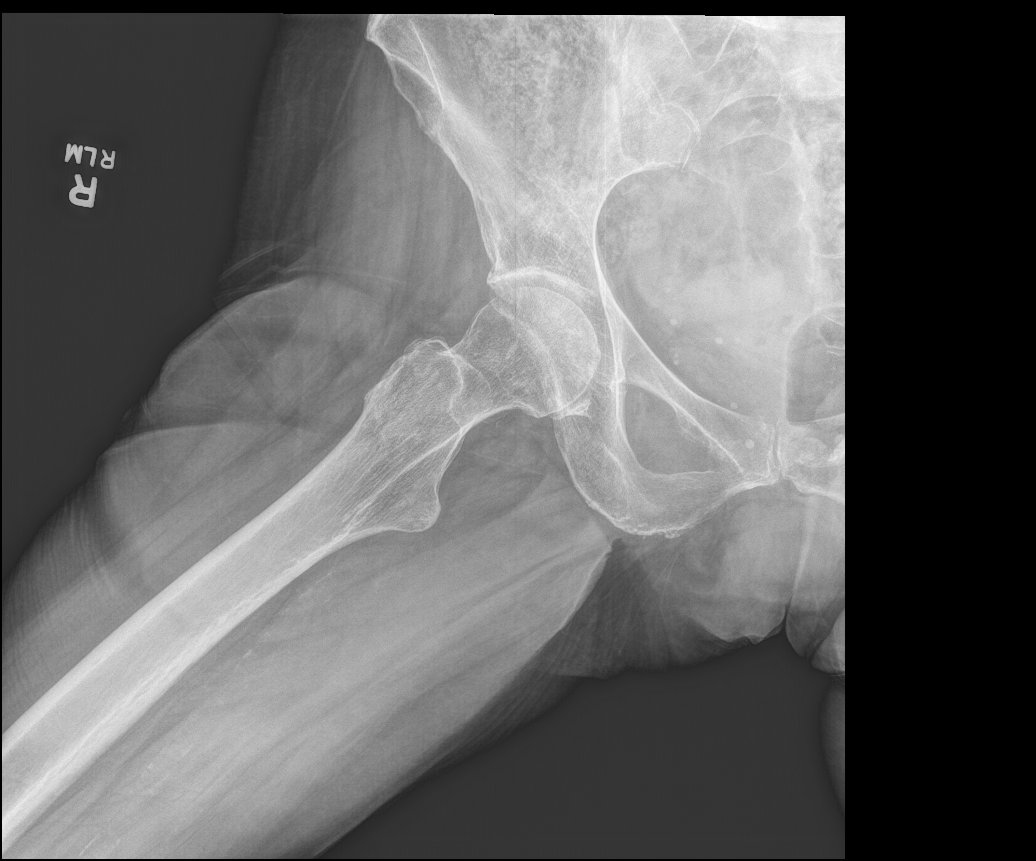

[5 of 5 positions shown; findings below may reference images not displayed]

FINDINGS: No fracture. Mild degenerative change of the hips with acetabular 
spurring. Hip joint space heights are preserved. Mild degenerative change of the 
pubic symphysis and SI joints. Degenerative change of the spine. Osteopenia. 
Pelvic phleboliths.
IMPRESSION: 1.  Mild degenerative change of the hips. 
2.  Osteopenia: DXA with TBS (trabecular bone score) may be helpful for further 
evaluation.

## 2022-03-22 ENCOUNTER — Ambulatory Visit: Payer: Medicare HMO | Admitting: Rheumatology

## 2022-03-22 VITALS — BP 105/70 | HR 83 | Wt 279.0 lb

## 2022-03-22 DIAGNOSIS — Z79899 Other long term (current) drug therapy: Secondary | ICD-10-CM

## 2022-03-22 DIAGNOSIS — M170 Bilateral primary osteoarthritis of knee: Secondary | ICD-10-CM

## 2022-03-22 DIAGNOSIS — M0579 Rheumatoid arthritis with rheumatoid factor of multiple sites without organ or systems involvement: Secondary | ICD-10-CM

## 2022-03-22 MED ORDER — METHOTREXATE SODIUM 2.5 MG OR TABS
ORAL_TABLET | ORAL | 0 refills | Status: DC
Start: 2022-03-22 — End: 2022-07-18

## 2022-03-22 NOTE — Progress Notes (Signed)
[place]  [address]  TEL: [phone]  l  FAX: [phone]    Telemedicine Note      Distant Site Telemedicine Encounter  I conducted this encounter via secure, live, face-to-face video conference with the patient. I reviewed the risks and benefits of telemedicine as pertinent to this visit and the patient agreed to proceed.    Provider Location: On-site location (clinic, hospital, on-site office)  Patient Location: At home  Present with patient: No one else present         03/22/2022    IDENTIFYING DATA:  MRN: [id number]  DOB: [birth year]    CHIEF COMPLAINT:      Caitlin Wiley is a very pleasant 72 year old female who presents for follow up of rheumatoid arthritis, osteoarthritis.    RHEUMATOLOGIC HISTORY OF PRESENT ILLNESS:  Diagnosis: RA, OA; initial contult 11/30/2017  Presenting Symptoms/Timeline: 2013 joint pain hands, wrists, knees, shoulders  Autoimmune Serologies: RF 140, CCP 16; CRP 17, ESR 23; UA 4.6  Pertinent Radiology: Xrays of hands, wrists, knees, feet: No erosions, consistent with OA, chondrocalcinosis  Medications Used: MXT 11/2017  Significant Flares:  Immunization Status: Influenza, PCV13, PPSV23, Zoster  Infectious Disease Screening: Hepatitis B NR, Hepatitis C, TB quantiferon NR  Other: Afib on chronic anticoag,     INTERVAL HISTORY:  Patient seen via telemedicine for follow up of rheumatoid arthritis and osteoarthritis. Patient was last seen over 4 months ago at which time she had stable disease. Patient is on methotrexate monotherapy for treatment.    Patient reports stable joint disease.  She does get intermittent exacerbation in pain i.e. knees.  She has noticed also increased stiffness the day prior to her next methotrexate dose.  No joint swelling or warmth but mostly stiffness.  Otherwise no significant changes in health.  She is tolerating methotrexate without significant side effects.  No serious infectious complications.    REVIEW OF  SYSTEMS:  Complete ROS is negative, except for those mentioned above in the Interval History    PROBLEM LIST:  Patient Active Problem List    Diagnosis Date Noted    Immunosuppression ([place]) 0000000 [id number]    Lichen sclerosus 123456 04/30/2020    Postmenopausal bleeding [N95.0] 01/10/2020    Cervical polyp [N84.1] 01/10/2020    Vulvar lesion [N90.89] 01/10/2020    Rheumatoid arthritis involving multiple sites with positive rheumatoid factor ([place]) [M05.79] 02/08/2018    Primary osteoarthritis of both knees [M17.0] 09/12/2017    Chronic anticoagulation [Z79.01] 08/02/2015    Right cervical radiculopathy [M54.12] 05/30/2015    Morbid obesity ([place]) [E66.01] 05/30/2015    Paroxysmal atrial fibrillation ([place]) [I48.0] 04/17/2014     A. S/p ablation ([facility]) 10/19/2016      GERD (gastroesophageal reflux disease) [K21.9] 11/08/2012    OA (osteoarthritis) [M19.90] 07/28/2012    Asthma [J45.909] 07/24/2012    Edema [R60.9] 07/24/2012    Sleep apnea [G47.30] 07/24/2012    Pulmonary nodule [R91.1] 07/24/2012    Grief reaction [F43.21] 07/24/2012     05/11/2011         SOCIAL/FAMILY HISTORY:  Reviewed with patient. No changes from what is currently noted in EPIC    ALLERGIES:  Review of patient's allergies indicates:  Allergies   Allergen Reactions    Bee Venom Anaphylaxis    Ciprofloxacin Other     Joint aching    Pneumococcal Vaccines Skin: Hives    Sulfa Antibiotics Skin:  Hives    Cats [Animals] Skin: Itching    Dust Mite Extract Skin: Itching     DUST    Pollen Extract Skin: Itching       MEDICATIONS:  Current Outpatient Medications   Medication Sig Dispense Refill    albuterol HFA 108 (90 Base) MCG/ACT inhaler inhale 1 to 2 puffs by mouth every 6 hours if needed for shortness of breath or wheezing 8.5 g 3    apixaban (Eliquis) 5 MG tablet Take 1 tablet (5 mg) by mouth 2 times a day. 180 tablet 3    Cholecalciferol (VITAMIN D3 OR) Take by mouth.      clobetasol 0.05 % ointment apply topically to VULVAR AREA THREE TIMES  WEEKLY 60 g 3    famotidine 20 MG tablet Take 1 tablet (20 mg) by mouth 2 times a day. 180 tablet 3    Flaxseed, Linseed, (FLAXSEED OIL) 1200 MG Oral Cap None Entered      folic acid 1 MG tablet take 3 tablets by mouth once daily 270 tablet 3    GLUCOSAMINE CHONDROITIN COMPLX OR       Magnesium 400 MG Oral Cap Take 1 capsule (400 mg) by mouth.      methotrexate 2.5 MG tablet take 8 tablets by mouth every week 96 tablet 0    montelukast 10 MG tablet Take 1 tablet (10 mg) by mouth daily. 90 tablet 3    Multiple Vitamins-Minerals (MULTIVITAMIN OR) no iron      Probiotic Product (PROBIOTIC DAILY OR)       SIMETHICONE-80 OR Take 2-3 tablets by mouth daily as needed.      triamterene-hydroCHLOROthiazide 37.5-25 MG tablet Take 1 tablet by mouth daily. 90 tablet 3     No current facility-administered medications for this visit.       PHYSICAL EXAM:  GEN: Pleasant, conversant, alert and oriented in no apparent distress.  PSYCH: Mood euthymic. Normal affect.  HEENT: Normocephalic, atraumatic. No conjunctivitis, sclerae anicteric. Moist mucous membrane.  PULM: Normal respiratory effort.  SKIN: No obvious rash on face.  NEURO: Face is symmetric.  MSK: No joint swelling.      LABS:  Results for orders placed or performed in visit on 11/11/21   CBC with Diff   Result Value Ref Range    WBC 10.72 (H) 4.3 - 10.0 10*3/uL    RBC 4.69 3.80 - 5.00 10*6/uL    Hemoglobin 13.8 11.5 - 15.5 g/dL    Hematocrit 41 36.0 - 45.0 %    MCV 87 81 - 98 fL    MCH 29.4 27.3 - 33.6 pg    MCHC 33.7 32.2 - 36.5 g/dL    Platelet Count 301 150 - 400 10*3/uL    RDW-CV 14.9 (H) 11.0 - 14.5 %    % Neutrophils 78 %    % Lymphocytes 14 %    % Monocytes 5 %    % Eosinophils 1 %    % Basophils 1 %    % Immature Granulocytes 1 %    Neutrophils 8.37 (H) 1.80 - 7.00 10*3/uL    Absolute Lymphocyte Count 1.51 1.00 - 4.80 10*3/uL    Monocytes 0.54 0.00 - 0.80 10*3/uL    Absolute Eosinophil Count 0.15 0.00 - 0.50 10*3/uL    Basophils 0.08 0.00 - 0.20 10*3/uL    Immature  Granulocytes 0.07 (H) 0.00 - 0.05 10*3/uL    Nucleated RBC 0.00 0.00 10*3/uL    % Nucleated RBC 0 %  Comprehensive Metabolic Panel   Result Value Ref Range    Sodium 135 135 - 145 meq/L    Potassium 3.6 3.6 - 5.2 meq/L    Chloride 101 98 - 108 meq/L    Carbon Dioxide, Total 23 22 - 32 meq/L    Anion Gap 11 4 - 12    Glucose 135 (H) 62 - 125 mg/dL    Urea Nitrogen 19 8 - 21 mg/dL    Creatinine 0.83 0.38 - 1.02 mg/dL    Protein (Total) 6.5 6.0 - 8.2 g/dL    Albumin 4.1 3.5 - 5.2 g/dL    Bilirubin (Total) 0.7 0.2 - 1.3 mg/dL    Calcium 9.0 8.9 - 10.2 mg/dL    AST (GOT) 16 9 - 38 U/L    Alkaline Phosphatase (Total) 75 38 - 172 U/L    ALT (GPT) 14 7 - 33 U/L    eGFR by CKD-EPI 2021 >60 >59 mL/min/1.73_m2   C-Reactive Protein   Result Value Ref Range    C_Reactive Protein 10.2 (H) 0.0 - 10.0 mg/L       RADIOLOGY:      ASSESSMENT AND PLAN:  Caitlin Wiley is a very pleasant 72 year old female who presents for follow up of rheumatoid arthritis, osteoarthritis.    #. Rheumatoid arthritis. Seropositive, nonerosive RA. Doing much better since MTX.  No evidence of synovitis on exam today. Knee pain, CMC joint pain sounds mechanical in nature and likely related to osteoarthritis.  We previously discussed therapeutic options including corticosteroid or hyaluronic acid derivative injection in the future.  She is a candidate for total knee replacement surgery once she is able to reach her target BMI.  Using Noon.     Stable disease.  We discussed options for increased stiffness prior to her next methotrexate dose.  She could consider Tylenol.  NSAIDs are not favored due to chronic anticoagulation although likely safe if taken occasionally.  We also discussed addition of DMARD such as hydroxychloroquine, sulfasalazine.  Patient would like to continue current therapy for now.      - continue methotrexate '20mg'$  once a week   - continue folic acid '2mg'$  daily     #. High risk medication. MTX toxicity labs today.  She will get  these labs in the near future.     #. Osteoarthritis. Most bothersome in the knees and is activity related. She is interested in TKR but will need to lose some weight first.       Weight is stable.      #. Left cubital tunnel neuropathy. Discussed conservative management.     #. Immunization. She is due for tetanus and shingles vaccine. She will get flu vaccine in clinic today. She plans on getting updated COVID vaccine later this week. She will hold MTX dose on Saturday.     #. Bone health. Last DEXA from 2017 was normal. DEXA 06/2018 normal.      - optimization of calcium and vitamin D   - daily weight bearing exercises       All the patient's questions were answered. The patient will follow up in 4 months.    CC: PCP    Dictated using speech recognition technology and electronically signed by Roxy Horseman MD   Note: This document may contain errors inherent to this technology. If you find any errors that might affect patient care, please contact our office as soon as possible.   [address], [address],  [place], [place] [address]  Ph. [phone] Fx. [phone]

## 2022-03-30 ENCOUNTER — Other Ambulatory Visit (INDEPENDENT_AMBULATORY_CARE_PROVIDER_SITE_OTHER): Payer: Medicare HMO

## 2022-03-30 ENCOUNTER — Other Ambulatory Visit
Admission: RE | Admit: 2022-03-30 | Discharge: 2022-03-30 | Disposition: A | Discharge status: Home / Self Care | Payer: Medicare HMO | Source: Ambulatory Visit | Attending: Rheumatology | Admitting: Rheumatology

## 2022-03-30 DIAGNOSIS — Z79899 Other long term (current) drug therapy: Secondary | ICD-10-CM | POA: Insufficient documentation

## 2022-03-30 DIAGNOSIS — M0579 Rheumatoid arthritis with rheumatoid factor of multiple sites without organ or systems involvement: Secondary | ICD-10-CM | POA: Insufficient documentation

## 2022-03-30 DIAGNOSIS — Z139 Encounter for screening, unspecified: Secondary | ICD-10-CM

## 2022-03-30 LAB — CBC, DIFF
% Basophils: 1 %
% Eosinophils: 3 %
% Immature Granulocytes: 0 %
% Lymphocytes: 22 %
% Monocytes: 7 %
% Neutrophils: 67 %
% Nucleated RBC: 0 %
Absolute Eosinophil Count: 0.24 10*3/uL (ref 0.00–0.50)
Absolute Lymphocyte Count: 2 10*3/uL (ref 1.00–4.80)
Basophils: 0.08 10*3/uL (ref 0.00–0.20)
Hematocrit: 43 % (ref 36.0–45.0)
Hemoglobin: 14.3 g/dL (ref 11.5–15.5)
Immature Granulocytes: 0.03 10*3/uL (ref 0.00–0.05)
MCH: 29.2 pg (ref 27.3–33.6)
MCHC: 33.3 g/dL (ref 32.2–36.5)
MCV: 88 fL (ref 81–98)
Monocytes: 0.63 10*3/uL (ref 0.00–0.80)
Neutrophils: 6 10*3/uL (ref 1.80–7.00)
Nucleated RBC: 0 10*3/uL
Platelet Count: 321 10*3/uL (ref 150–400)
RBC: 4.9 10*6/uL (ref 3.80–5.00)
RDW-CV: 15.5 % — ABNORMAL HIGH (ref 11.0–14.5)
WBC: 8.98 10*3/uL (ref 4.3–10.0)

## 2022-03-30 LAB — COMPREHENSIVE METABOLIC PANEL
ALT (GPT): 19 U/L (ref 7–33)
AST (GOT): 19 U/L (ref 9–38)
Albumin: 4.1 g/dL (ref 3.5–5.2)
Alkaline Phosphatase (Total): 73 U/L (ref 38–172)
Anion Gap: 9 (ref 4–12)
Bilirubin (Total): 0.9 mg/dL (ref 0.2–1.3)
Calcium: 9 mg/dL (ref 8.9–10.2)
Carbon Dioxide, Total: 23 meq/L (ref 22–32)
Chloride: 107 meq/L (ref 98–108)
Creatinine: 0.83 mg/dL (ref 0.38–1.02)
Glucose: 126 mg/dL — ABNORMAL HIGH (ref 62–125)
Potassium: 4 meq/L (ref 3.6–5.2)
Protein (Total): 6.5 g/dL (ref 6.0–8.2)
Sodium: 139 meq/L (ref 135–145)
Urea Nitrogen: 19 mg/dL (ref 8–21)
eGFR by CKD-EPI 2021: >60 mL/min/{1.73_m2} (ref >59)

## 2022-03-30 LAB — C_REACTIVE PROTEIN: C_Reactive Protein: 11.4 mg/L — ABNORMAL HIGH (ref 0.0–10.0)

## 2022-03-30 NOTE — Result Encounter Note (Signed)
Labs stable

## 2022-03-30 NOTE — Progress Notes (Signed)
LAB

## 2022-04-15 ENCOUNTER — Encounter (INDEPENDENT_AMBULATORY_CARE_PROVIDER_SITE_OTHER): Payer: Self-pay | Admitting: Family Medicine

## 2022-04-15 NOTE — Telephone Encounter (Signed)
Up dated colonoscopy in [facility].      Sent my chart message back to pt that this was done.

## 2022-04-17 ENCOUNTER — Other Ambulatory Visit (HOSPITAL_BASED_OUTPATIENT_CLINIC_OR_DEPARTMENT_OTHER): Payer: Self-pay | Admitting: Rheumatology

## 2022-04-17 DIAGNOSIS — M0579 Rheumatoid arthritis with rheumatoid factor of multiple sites without organ or systems involvement: Secondary | ICD-10-CM

## 2022-04-21 ENCOUNTER — Other Ambulatory Visit (HOSPITAL_BASED_OUTPATIENT_CLINIC_OR_DEPARTMENT_OTHER): Payer: Self-pay | Admitting: Rheumatology

## 2022-04-21 DIAGNOSIS — M0579 Rheumatoid arthritis with rheumatoid factor of multiple sites without organ or systems involvement: Secondary | ICD-10-CM

## 2022-05-07 IMAGING — DX CHEST PA AND LATERAL
1 series · 2 of 2 positions shown · non-contrast
Comparison: 11/11/2017 and 08/27/2016.

________________________________________________________________________________________________ 
******** ADDENDUM #1 ********/n 
Power scribe error.

[Series 1: PA · 0.14mm/px · 2 of 2 slices shown]
[im 1/2]
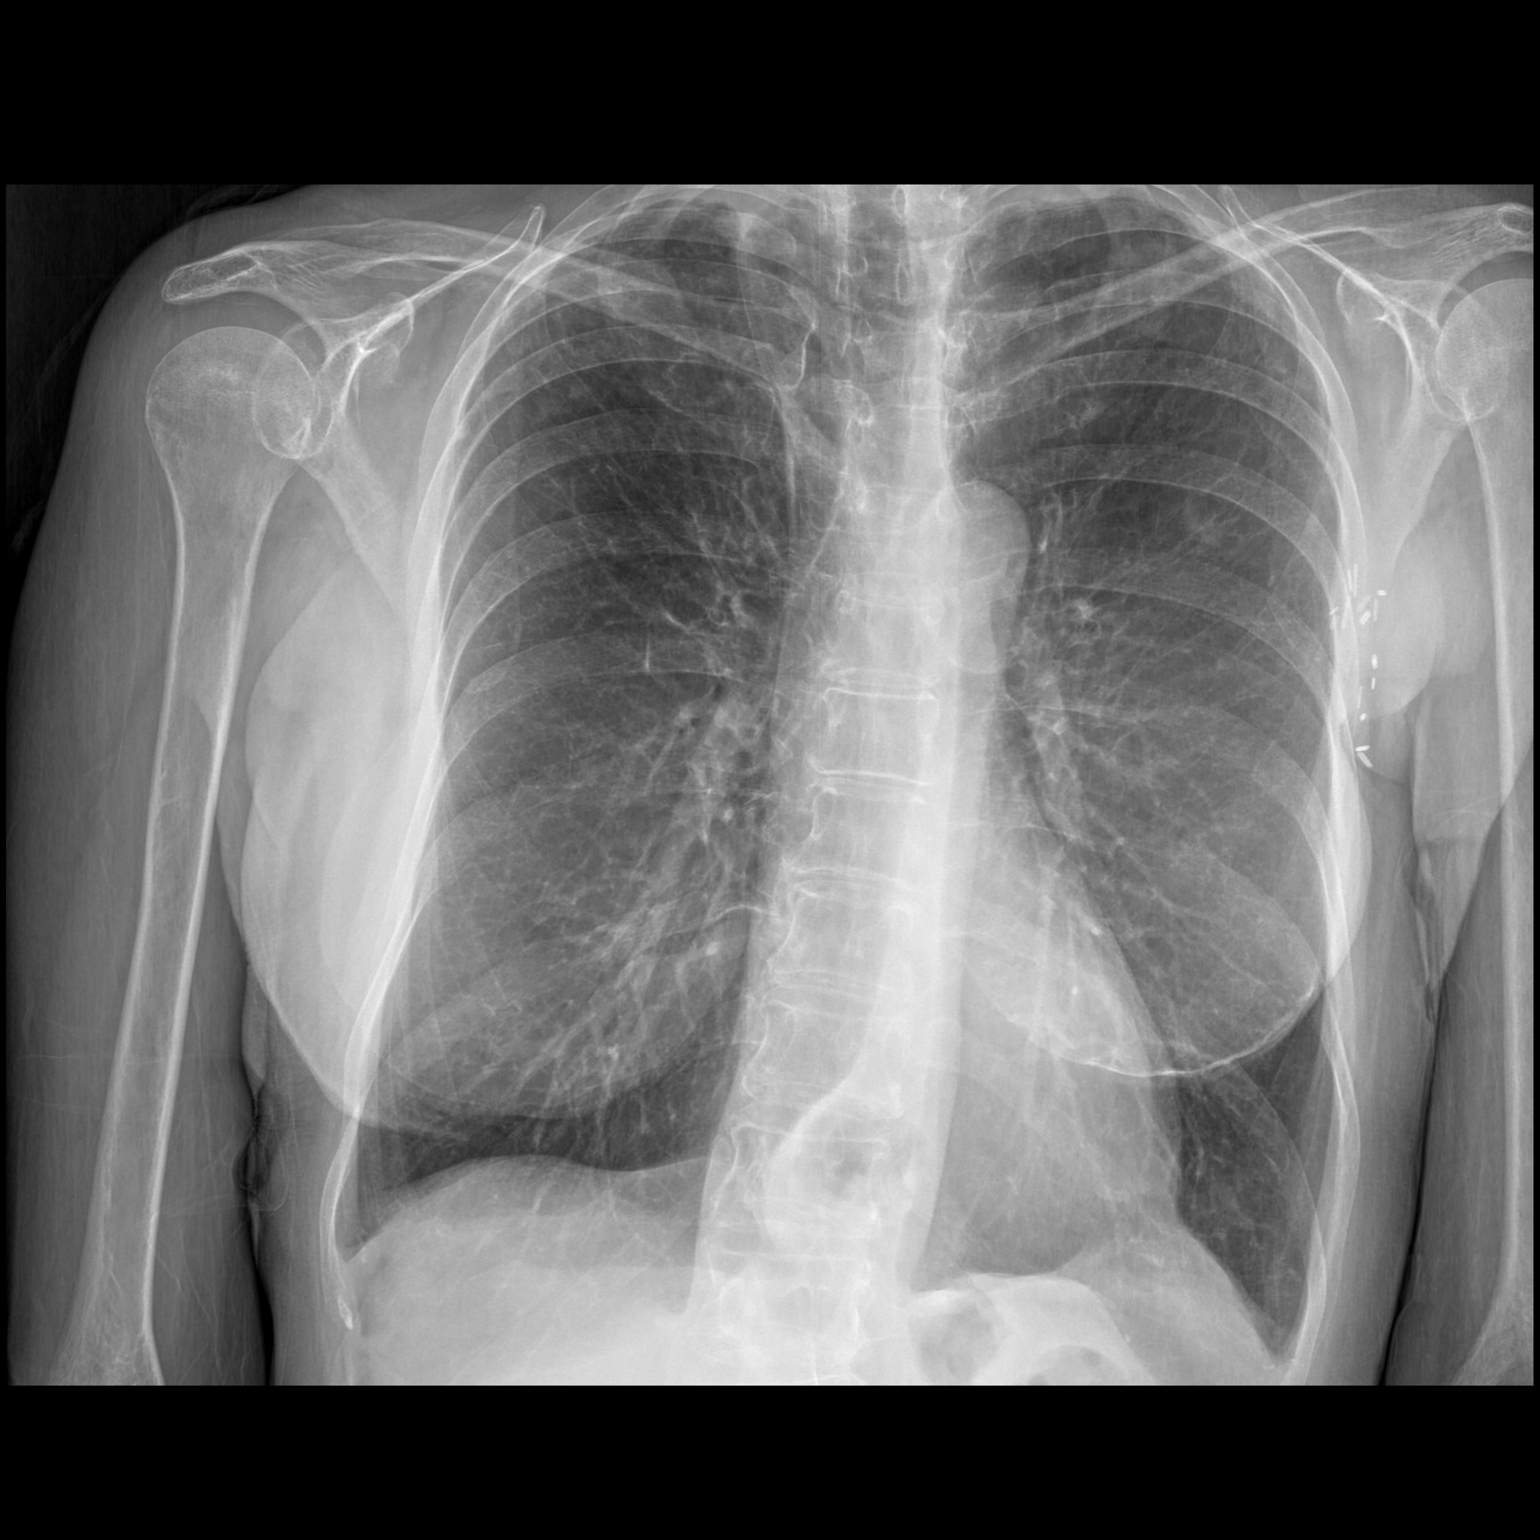
[im 2/2]
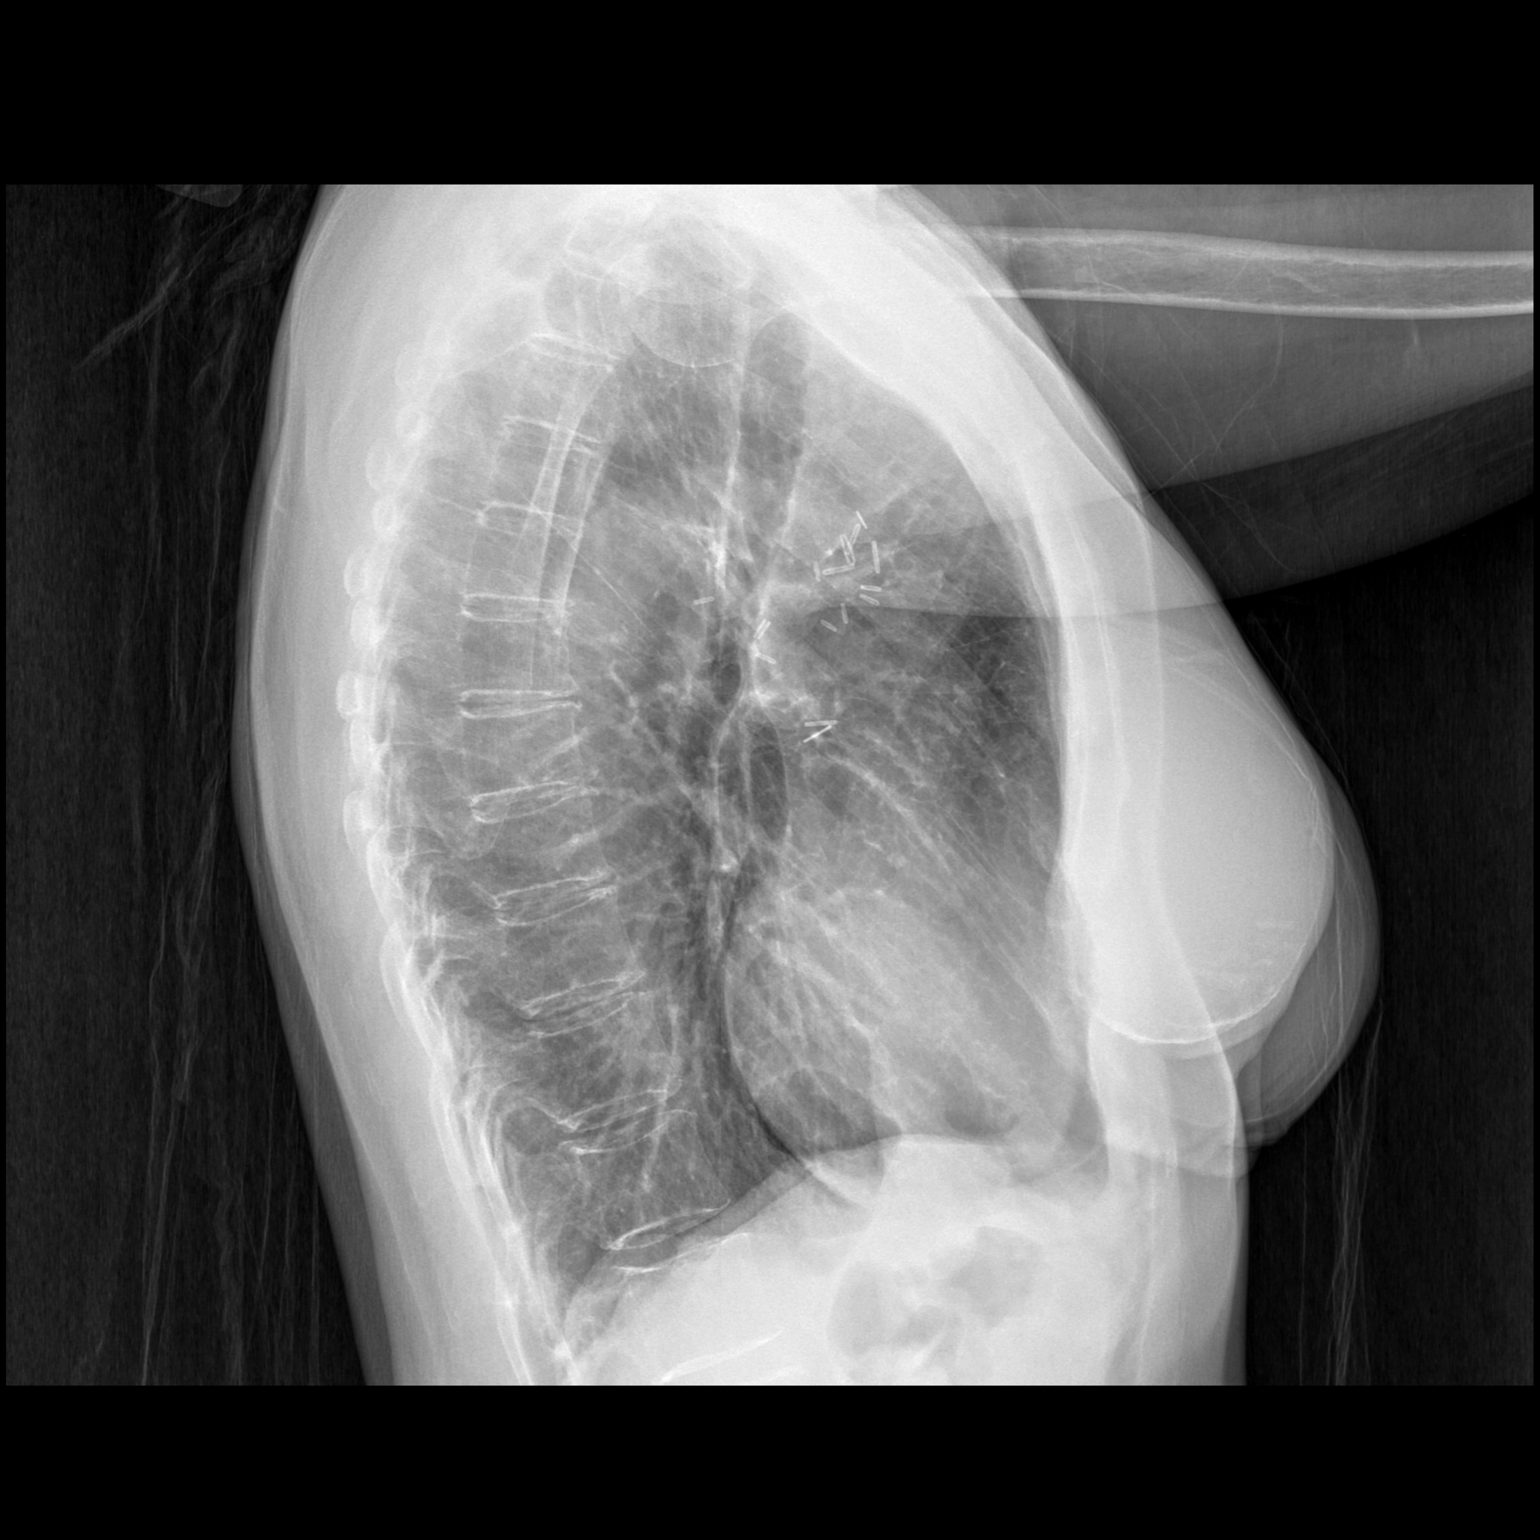

[2 of 2 positions shown; findings below may reference images not displayed]

IMPRESSION: Stable chest radiographic appearance. 
CLINICAL INDICATION: Acute upper respiratory infection.. History of breast 
cancer with mastectomy. Smoking cessation 20 years ago.
FINDINGS: Subtle scarring left upper lung is unchanged. Posttreatment changes 
left breast with surgical clips within the axilla. No consolidation. No 
effusion. Normal cardiac size and pulmonary vascularity. No pneumothorax. No 
acute osseous abnormality. Small esophageal hiatal hernia.
IMPRESSION: Stable chest mammographic appearance.

## 2022-05-18 ENCOUNTER — Encounter (HOSPITAL_BASED_OUTPATIENT_CLINIC_OR_DEPARTMENT_OTHER): Payer: Self-pay | Admitting: Rheumatology

## 2022-05-18 DIAGNOSIS — M0579 Rheumatoid arthritis with rheumatoid factor of multiple sites without organ or systems involvement: Secondary | ICD-10-CM

## 2022-05-24 NOTE — Addendum Note (Signed)
Addended by: Consuela Mimes on: 05/24/2022 04:02 PM     Modules accepted: Orders

## 2022-05-24 NOTE — Addendum Note (Signed)
Addended by: Okey Dupre on: 05/24/2022 04:14 PM     Modules accepted: Orders

## 2022-06-09 ENCOUNTER — Ambulatory Visit: Payer: Medicare HMO | Attending: Cardiovascular Disease | Admitting: Cardiovascular Disease

## 2022-06-09 ENCOUNTER — Ambulatory Visit (INDEPENDENT_AMBULATORY_CARE_PROVIDER_SITE_OTHER): Payer: Medicare HMO

## 2022-06-09 VITALS — Temp 97.2°F

## 2022-06-09 VITALS — BP 118/84 | HR 79 | Ht 66.0 in | Wt 282.0 lb

## 2022-06-09 DIAGNOSIS — Z23 Encounter for immunization: Secondary | ICD-10-CM

## 2022-06-09 DIAGNOSIS — I480 Paroxysmal atrial fibrillation: Secondary | ICD-10-CM | POA: Insufficient documentation

## 2022-06-09 DIAGNOSIS — I10 Essential (primary) hypertension: Secondary | ICD-10-CM | POA: Insufficient documentation

## 2022-06-09 MED ORDER — ZZCOVID-19 MRNA VACC (MODERNA) 50 MCG/0.5ML IM WRAPPER
50.0000 ug | Freq: Once | INTRAMUSCULAR | Status: AC
Start: 2022-06-09 — End: 2022-06-09
  Administered 2022-06-09: 50 ug via INTRAMUSCULAR

## 2022-06-09 NOTE — Patient Instructions (Addendum)
Your heart checks out today, exam is normal except for the premature beats.   Blood pressure is controlled, keep monitoring.   I would like you to exercise more, consider getting a NuStep. Try it out at the [facility].  You can also try a recumbent bike.   I don't recommend medication changes.   I will plan to see you in one year, send [place] Kardia tracings if you think you might be in atrial fibrillation.

## 2022-06-09 NOTE — Progress Notes (Signed)
Vaccine Screening Questions    Interpreter used: No    Vaccine(s) to be administered:   Medication Administrations This Visit         COVID-19 Moderna mRNA vaccine (Spikevax) injection 50 mcg Admin Date  06/09/2022  10:01 Action  Given Dose  50 mcg Route  Intramuscular Site  Left Deltoid Documented By  Clement Sayres, CMA    Ordering Provider: Maryagnes Amos, ARNP    NDC: 29562-130-86    Lot#: [id number]            Relevant VIS or EUA was offered and declined by patient today      1. Are you allergic to Latex? NO    2.  Have you had a serious reaction or an allergic reaction to a vaccine?  NO    3.  Currently have a moderate or severe illness, including fever?  NO    4.  Ever had a seizure or any neurological problem associated with a vaccine? (DTaP/TDaP/DTP pertinent) NO    5.  Is patient receiving any live vaccinations today? (Varicella-Chickenpox, MMR-Measles/Mumps/Rubella, Zostavax, Flumist, Yellow Fever) NOTE: oral rotavirus is exempt  NO    If YES to any of the questions above - Do NOT give vaccine.  Consult with RN or provider in clinic.  (#5 can be YES if all Live vaccine questions are answered NO)    If NO to all questions above - Patient may receive vaccine.    6.  Do you need to receive the Flu vaccine today? NO    7. Do you need to receive Covid-19 vaccine today? YES:    Covid Pre-Vaccination Screening    Are you feeling sick today? NO  Have you ever received a dose of COVID 19 vaccine? YES  If yes, which vaccine have you received last? Moderna    Have you ever had a severe allergic reaction (e.g. anaphylaxis) to anything? For example, a reaction for which you were treated with epinephrine (EpiPen) or for which you had to go to the hospital? NO  Was the severe allergic reaction after receiving a COVID 19 vaccine? N/A  Was the severe allergic reaction after receiving another vaccine or another injectable medication? N/A  Do you have a physical vaccination card (compare with electronic record)? No  Do you  give consent today to receive a COVID 19 vaccination? YES        MAR Documentation:    Medication Administrations This Visit         COVID-19 Moderna mRNA vaccine (Spikevax) injection 50 mcg Admin Date  06/09/2022  10:01 Action  Given Dose  50 mcg Route  Intramuscular Site  Left Deltoid Documented By  Clement Sayres, CMA    Ordering Provider: Maryagnes Amos, ARNP    NDC: 29528-413-24    Lot#: [id number]                Vaccine given today without initial adverse effect. YES  All patients are encouraged to wait 15 minutes before leaving after receiving any vaccine.    Clement Sayres, CMA

## 2022-06-13 NOTE — Progress Notes (Signed)
Caitlin Wiley returns for management paroxysmal atrial fibrillation and hypertension.  Additional problems include rheumatoid arthritis and sleep apnea.  She denies any new medical problems.  She describes a mechanical fall recently after stepping on a curb and tripping, and she had head trauma.  She was evaluated in the emergency room and had no evidence of bleeding by CT of the head.  She denies syncope, TIA symptoms or bleeding on apixaban.  She reports occasional palpitations associated with stress with episodes of premature beats based on her Kardia monitor tracings.  Her physical activity is limited by her rheumatoid arthritis, she walks about 3000 steps per day and gardens with joint pain but no dyspnea.  Blood pressure last week was 130/84 when she was "stressed", but otherwise is typically around 118/76.    Review of patient's allergies indicates:  Allergies   Allergen Reactions    Bee Venom Anaphylaxis    Ciprofloxacin Other     Joint aching    Pneumococcal Vaccines Skin: Hives    Sulfa Antibiotics Skin: Hives    Cats [Animals] Skin: Itching    Dust Mite Extract Skin: Itching     DUST    Pollen Extract Skin: Itching     Current Outpatient Medications   Medication Sig Dispense Refill    albuterol HFA 108 (90 Base) MCG/ACT inhaler inhale 1 to 2 puffs by mouth every 6 hours if needed for shortness of breath or wheezing 8.5 g 3    apixaban (Eliquis) 5 MG tablet Take 1 tablet (5 mg) by mouth 2 times a day. 180 tablet 3    Cholecalciferol (VITAMIN D3 OR) Take by mouth.      clobetasol 0.05 % ointment apply topically to VULVAR AREA THREE TIMES WEEKLY 60 g 3    famotidine 20 MG tablet Take 1 tablet (20 mg) by mouth 2 times a day. 180 tablet 3    Flaxseed, Linseed, (FLAXSEED OIL) 1200 MG Oral Cap None Entered      folic acid 1 MG tablet take 3 tablets by mouth once daily 270 tablet 3    GLUCOSAMINE CHONDROITIN COMPLX OR       Magnesium 400 MG Oral Cap Take 1 capsule (400 mg) by mouth.      methotrexate 2.5 MG tablet  take 8 tablets by mouth every week 96 tablet 0    montelukast 10 MG tablet Take 1 tablet (10 mg) by mouth daily. 90 tablet 3    Multiple Vitamins-Minerals (MULTIVITAMIN OR) no iron      Probiotic Product (PROBIOTIC DAILY OR)       SIMETHICONE-80 OR Take 2-3 tablets by mouth daily as needed.      triamterene-hydroCHLOROthiazide 37.5-25 MG tablet Take 1 tablet by mouth daily. 90 tablet 3     No current facility-administered medications for this visit.     Patient Active Problem List   Diagnosis    Asthma    Edema    Sleep apnea    Pulmonary nodule    Grief reaction    OA (osteoarthritis)    GERD (gastroesophageal reflux disease)    Paroxysmal atrial fibrillation ([facility])    Right cervical radiculopathy    Morbid obesity ([facility])    Chronic anticoagulation    Primary osteoarthritis of both knees    Rheumatoid arthritis involving multiple sites with positive rheumatoid factor ([facility])    Postmenopausal bleeding    Cervical polyp    Vulvar lesion    Lichen sclerosus    Immunosuppression ([facility])  BP 118/84   Pulse 79   Ht 5\' 6"  (1.676 m)   Wt (!) 127.9 kg (282 lb)   LMP  (LMP Unknown)   SpO2 96%   BMI 45.52 kg/m   She is alert and comfortable.  JVP is normal at 5 cm, no carotid bruit.  Lungs are clear.  Rhythm regular with premature beats but no murmur or gallop.  No peripheral edema.  She has been concerned about possible lump in her upper arm/axilla, on my exam I do not find evidence of mass or lymph nodes in the axilla left or right, or upper arm.    Laboratory: From March comprehensive metabolic panel looked fine, glucose 126 otherwise normal.    A/P:  (I48.0) Paroxysmal atrial fibrillation ([facility])  (primary encounter diagnosis)  No clinical recurrence of atrial fibrillation, her palpitations are associated with premature atrial beats.  She will send [place] Kardia tracings if she develops symptomatic palpitations.  Plan: Continue observation and long-term anticoagulation    (I10) Essential hypertension  Blood pressure  appears to be well-controlled on the combination diuretic, she will continue to monitor blood pressure.  I encouraged her to exercise by trying a recumbent bike or NuStep as this type of exercise is more joint friendly.  Plan: As above    I spent a total of 24 minutes for the patient's care on the date of the service.

## 2022-07-15 ENCOUNTER — Other Ambulatory Visit (HOSPITAL_BASED_OUTPATIENT_CLINIC_OR_DEPARTMENT_OTHER): Payer: Self-pay | Admitting: Rheumatology

## 2022-07-15 DIAGNOSIS — M0579 Rheumatoid arthritis with rheumatoid factor of multiple sites without organ or systems involvement: Secondary | ICD-10-CM

## 2022-07-18 MED ORDER — METHOTREXATE SODIUM 2.5 MG OR TABS
ORAL_TABLET | ORAL | 0 refills | Status: DC
Start: 2022-07-18 — End: 2022-08-03

## 2022-07-18 NOTE — Telephone Encounter (Signed)
Next visit 08/03/22

## 2022-08-03 ENCOUNTER — Other Ambulatory Visit (INDEPENDENT_AMBULATORY_CARE_PROVIDER_SITE_OTHER): Payer: Medicare HMO

## 2022-08-03 ENCOUNTER — Ambulatory Visit: Payer: Medicare HMO | Attending: Rheumatology | Admitting: Rheumatology

## 2022-08-03 VITALS — BP 118/74 | HR 85 | Wt 285.0 lb

## 2022-08-03 DIAGNOSIS — M0579 Rheumatoid arthritis with rheumatoid factor of multiple sites without organ or systems involvement: Secondary | ICD-10-CM | POA: Insufficient documentation

## 2022-08-03 DIAGNOSIS — Z79899 Other long term (current) drug therapy: Secondary | ICD-10-CM | POA: Insufficient documentation

## 2022-08-03 DIAGNOSIS — M170 Bilateral primary osteoarthritis of knee: Secondary | ICD-10-CM | POA: Insufficient documentation

## 2022-08-03 LAB — COMPREHENSIVE METABOLIC PANEL
ALT (GPT): 16 U/L (ref 7–33)
AST (GOT): 17 U/L (ref 9–38)
Albumin: 4.2 g/dL (ref 3.5–5.2)
Alkaline Phosphatase (Total): 75 U/L (ref 38–172)
Anion Gap: 8 (ref 4–12)
Bilirubin (Total): 0.7 mg/dL (ref 0.2–1.3)
Calcium: 9.6 mg/dL (ref 8.9–10.2)
Carbon Dioxide, Total: 27 meq/L (ref 22–32)
Chloride: 103 meq/L (ref 98–108)
Creatinine: 1.04 mg/dL — ABNORMAL HIGH (ref 0.38–1.02)
Glucose: 100 mg/dL (ref 62–125)
Potassium: 4.3 meq/L (ref 3.6–5.2)
Protein (Total): 6.8 g/dL (ref 6.0–8.2)
Sodium: 138 meq/L (ref 135–145)
Urea Nitrogen: 19 mg/dL (ref 8–21)
eGFR by CKD-EPI 2021: 57 mL/min/{1.73_m2} — ABNORMAL LOW (ref >59)

## 2022-08-03 LAB — CBC, DIFF
% Basophils: 1 %
% Eosinophils: 1 %
% Immature Granulocytes: 0 %
% Lymphocytes: 17 %
% Monocytes: 6 %
% Neutrophils: 75 %
% Nucleated RBC: 0 %
Absolute Eosinophil Count: 0.14 10*3/uL (ref 0.00–0.50)
Absolute Lymphocyte Count: 1.82 10*3/uL (ref 1.00–4.80)
Basophils: 0.08 10*3/uL (ref 0.00–0.20)
Hematocrit: 44 % (ref 36.0–45.0)
Hemoglobin: 14.2 g/dL (ref 11.5–15.5)
Immature Granulocytes: 0.03 10*3/uL (ref 0.00–0.05)
MCH: 28.9 pg (ref 27.3–33.6)
MCHC: 32.2 g/dL (ref 32.2–36.5)
MCV: 90 fL (ref 81–98)
Monocytes: 0.66 10*3/uL (ref 0.00–0.80)
Neutrophils: 8.16 10*3/uL — ABNORMAL HIGH (ref 1.80–7.00)
Nucleated RBC: 0 10*3/uL
Platelet Count: 302 10*3/uL (ref 150–400)
RBC: 4.91 10*6/uL (ref 3.80–5.00)
RDW-CV: 15.4 % — ABNORMAL HIGH (ref 11.0–14.5)
WBC: 10.89 10*3/uL — ABNORMAL HIGH (ref 4.3–10.0)

## 2022-08-03 LAB — C_REACTIVE PROTEIN: C_Reactive Protein: 9.4 mg/L (ref 0.0–10.0)

## 2022-08-03 MED ORDER — METHOTREXATE SODIUM 2.5 MG OR TABS
ORAL_TABLET | ORAL | 0 refills | Status: DC
Start: 2022-10-11 — End: 2022-12-09

## 2022-08-03 NOTE — Progress Notes (Signed)
 Courtesy Draw

## 2022-08-03 NOTE — Progress Notes (Signed)
[address] [facility] [address]  [address]  [place], [place]  [address]  TEL: [phone]  l  FAX: [phone]    Follow Up Clinic Note  08/03/2022    IDENTIFYING DATA/CC:    Caitlin Wiley is a very pleasant 72 year old female who presents for follow up of rheumatoid arthritis, osteoarthritis.    RHEUMATOLOGIC HISTORY OF PRESENT ILLNESS:  Diagnosis: RA, OA; initial contult 11/30/2017  Presenting Symptoms/Timeline: 2013 joint pain hands, wrists, knees, shoulders  Autoimmune Serologies: RF 140, CCP 16; CRP 17, ESR 23; UA 4.6  Pertinent Radiology: Xrays of hands, wrists, knees, feet: No erosions, consistent with OA, chondrocalcinosis  Medications Used: MXT 11/2017  Significant Flares:  Immunization Status: Influenza, PCV13, PPSV23, Zoster  Infectious Disease Screening: Hepatitis B NR, Hepatitis C, TB quantiferon NR  Other: Afib on chronic anticoag,     INTERVAL HISTORY:  Patient returns for follow-up of rheumatoid arthritis, osteoarthritis.  She was last seen over 3 months ago at which time she has stable disease.  Patient is on methotrexate monotherapy for treatment of her rheumatoid arthritis.  She also has significant osteoarthritis.  This is particularly bothersome in the knees.    Overall reports stable disease.  Her hands and knees continue to bother her but this is typically activity related.  She has been working in her garden lately hence having more knee pain as well as hand pain particularly in the bilateral thumbs.  Squeezing the water hose hurts her right thumb.  It is slightly swollen today.  She takes Tylenol which helps.  Pain typically improves with prolonged rest.  She is tolerating methotrexate without side effects.  No serious infectious complications.  She has to go up and down the stairs multiple times a day.  She feels radiation of pain down her lateral knee.  She does not do physical therapy exercises at home as she finds it difficult without the equipment.    REVIEW OF SYSTEMS:  Complete  ROS is negative, except for those mentioned above in the Interval History    PROBLEM LIST:  Patient Active Problem List    Diagnosis Date Noted    Immunosuppression (HCC) [D84.9] 11/20/2020    Lichen sclerosus [L90.0] 04/30/2020    Postmenopausal bleeding [N95.0] 01/10/2020    Cervical polyp [N84.1] 01/10/2020    Vulvar lesion [N90.89] 01/10/2020    Rheumatoid arthritis involving multiple sites with positive rheumatoid factor (HCC) [M05.79] 02/08/2018    Primary osteoarthritis of both knees [M17.0] 09/12/2017    Chronic anticoagulation [Z79.01] 08/02/2015    Right cervical radiculopathy [M54.12] 05/30/2015    Morbid obesity (HCC) [E66.01] 05/30/2015    Paroxysmal atrial fibrillation (HCC) [I48.0] 04/17/2014     A. S/p ablation (Cryo PVI) 10/19/2016      GERD (gastroesophageal reflux disease) [K21.9] 11/08/2012    OA (osteoarthritis) [M19.90] 07/28/2012    Asthma [J45.909] 07/24/2012    Edema [R60.9] 07/24/2012    Sleep apnea [G47.30] 07/24/2012    Pulmonary nodule [R91.1] 07/24/2012    Grief reaction [F43.21] 07/24/2012     05/11/2011         SOCIAL/FAMILY HISTORY:  Reviewed with patient. No changes from what is currently noted in EPIC    ALLERGIES:  Review of patient's allergies indicates:  Allergies   Allergen Reactions    Bee Venom Anaphylaxis    Ciprofloxacin Other     Joint aching    Pneumococcal Vaccines Skin: Hives    Sulfa Antibiotics Skin: Hives  Cats [Animals] Skin: Itching    Dust Mite Extract Skin: Itching     DUST    Pollen Extract Skin: Itching       MEDICATIONS:  Current Outpatient Medications   Medication Sig Dispense Refill    albuterol HFA 108 (90 Base) MCG/ACT inhaler inhale 1 to 2 puffs by mouth every 6 hours if needed for shortness of breath or wheezing 8.5 g 3    apixaban (Eliquis) 5 MG tablet Take 1 tablet (5 mg) by mouth 2 times a day. 180 tablet 3    Cholecalciferol (VITAMIN D3 OR) Take by mouth.      clobetasol 0.05 % ointment apply topically to VULVAR AREA THREE TIMES WEEKLY 60 g 3     famotidine 20 MG tablet Take 1 tablet (20 mg) by mouth 2 times a day. 180 tablet 3    Flaxseed, Linseed, (FLAXSEED OIL) 1200 MG Oral Cap None Entered      folic acid 1 MG tablet take 3 tablets by mouth once daily 270 tablet 3    GLUCOSAMINE CHONDROITIN COMPLX OR       Magnesium 400 MG Oral Cap Take 1 capsule (400 mg) by mouth.      methotrexate 2.5 MG tablet take 8 tablets by mouth every week 96 tablet 0    montelukast 10 MG tablet Take 1 tablet (10 mg) by mouth daily. 90 tablet 3    Multiple Vitamins-Minerals (MULTIVITAMIN OR) no iron      Probiotic Product (PROBIOTIC DAILY OR)       SIMETHICONE-80 OR Take 2-3 tablets by mouth daily as needed.      triamterene-hydroCHLOROthiazide 37.5-25 MG tablet Take 1 tablet by mouth daily. 90 tablet 3     No current facility-administered medications for this visit.       PHYSICAL EXAMINATION:  Vital signs:  BP 118/74   Pulse 85   Wt (!) 129.3 kg (285 lb)   LMP  (LMP Unknown)   SpO2 95%   BMI 46.00 kg/m   General:  Awake, alert, and oriented, no apparent distress, pleasant, and cooperative  Psychologic:  Mood is euthymic, affect is congruent  EYES: Anicteric sclera, no conjunctival injection  ENT:  Normocephalic, atraumatic, moist membranes, no oral ulcers   Pulmonary:  Non-labored breathing, no wheezes, rhonchi, or rales  Cardiovascular:  RRR, no murmurs, rubs or gallops  Skin:  Normal temperature and texture, No visible rashes, ulcers, or nodules  Neurologic:  Light touch sensation is grossly intact. Face symmetric   Musculoskeletal:    Gait: Normal  Joints: No evidence of synovitis, joint swelling, or tenderness in the joints with the exception of right radial wrist which is slightly swollen.  Tenderness to palpation.  Nails: No pitting, clubbing, or cyanosis noted in the fingernails bilaterally   Muscle strength: Strength is grossly 5 out of 5 throughout the bilateral upper and lower extremities    LABS:    Pending    RADIOLOGY:   None    DISEASE ACTIVITY:  Patient  Function: 2  Patient Pain Assessment: 5  Patient Global Assessment: 2   Combined Rapid 3 Score: 9    ASSESSMENT AND PLAN:  Caitlin Wiley is a very pleasant 72 year old female who presents for follow up of rheumatoid arthritis, osteoarthritis.    #. Rheumatoid arthritis. Seropositive, nonerosive RA. Doing much better since MTX.  No evidence of synovitis on exam today. Knee pain, CMC joint pain sounds mechanical in nature and likely related to  osteoarthritis.  We previously discussed therapeutic options including corticosteroid or hyaluronic acid derivative injection in the future.  She is a candidate for total knee replacement surgery once she is able to reach her target BMI.       Stable disease.  Patient can take Tylenol as needed which has been helpful for osteoarthritis.  NSAIDs are not favored due to chronic anticoagulation although likely safe if taken occasionally.  We also discussed addition of DMARD such as hydroxychloroquine, sulfasalazine.  Patient would like to continue current therapy for now.      - continue methotrexate 20mg  once a week   - continue folic acid 2mg  daily     #. High risk medication. MTX toxicity labs today.       #. Osteoarthritis. Most bothersome in the knees and is activity related. She is interested in TKR but will need to lose some weight first.       Recent discussed restarting physical therapy.  She has difficulty doing exercises at home.  I am concerned about her ambulation.  She also climbs multiple stairs a day.  Referral to PT was provided today.     #. Left cubital tunnel neuropathy. Discussed conservative management.     #. Immunization. She we will get updated flu and COVID-vaccine later this fall.     #. Bone health. Last DEXA from 2017 was normal. DEXA 06/2018 normal.      - optimization of calcium and vitamin D   - daily weight bearing exercises        All the patient's questions were answered. The patient will follow up in 4 months.    Dictated using Advertising account planner and electronically signed by Vilinda Flake, MD   Note: This document may contain errors inherent to this technology. If you find any errors that might affect patient care, please contact our office as soon as possible.   [address], [address], [place], [place] [address]  Ph. [phone] Fx. [phone]

## 2022-08-04 NOTE — Result Encounter Note (Signed)
Labs stable. Inflammatory markers are normal.

## 2022-09-17 ENCOUNTER — Emergency Department: Payer: Self-pay

## 2022-10-07 ENCOUNTER — Encounter (INDEPENDENT_AMBULATORY_CARE_PROVIDER_SITE_OTHER): Payer: Self-pay

## 2022-10-18 ENCOUNTER — Encounter (INDEPENDENT_AMBULATORY_CARE_PROVIDER_SITE_OTHER): Payer: Self-pay | Admitting: Family Medicine

## 2022-10-18 ENCOUNTER — Encounter (HOSPITAL_BASED_OUTPATIENT_CLINIC_OR_DEPARTMENT_OTHER): Payer: Self-pay | Admitting: Rheumatology

## 2022-10-18 NOTE — Telephone Encounter (Signed)
Chart updated

## 2022-10-26 ENCOUNTER — Encounter (HOSPITAL_BASED_OUTPATIENT_CLINIC_OR_DEPARTMENT_OTHER): Payer: Self-pay | Admitting: Cardiovascular Disease

## 2022-10-26 ENCOUNTER — Encounter (HOSPITAL_BASED_OUTPATIENT_CLINIC_OR_DEPARTMENT_OTHER): Payer: Medicare HMO | Admitting: Rheumatology

## 2022-10-26 DIAGNOSIS — Z79899 Other long term (current) drug therapy: Secondary | ICD-10-CM

## 2022-10-26 NOTE — Telephone Encounter (Signed)
done

## 2022-10-27 NOTE — Telephone Encounter (Addendum)
Called [facility] and  Johnny Bridge is the surgery scheduler [phone] who I left a voicemail with our fax number in case there was a form or letter that needed to be sent to [place].    Dr Marca Ancona OV note from 06/09/22:    A/P:  (I48.0) Paroxysmal atrial fibrillation (HCC)  (primary encounter diagnosis)  No clinical recurrence of atrial fibrillation, her palpitations are associated with premature atrial beats.  She will send [place] Kardia tracings if she develops symptomatic palpitations.  Plan: Continue observation and long-term anticoagulation     (I10) Essential hypertension  Blood pressure appears to be well-controlled on the combination diuretic, she will continue to monitor blood pressure.  I encouraged her to exercise by trying a recumbent bike or NuStep as this type of exercise is more joint friendly.  Plan: As above

## 2022-11-10 ENCOUNTER — Ambulatory Visit: Payer: Medicare HMO | Attending: Rheumatology | Admitting: Rheumatology

## 2022-11-10 VITALS — BP 139/68 | HR 83 | Wt 263.0 lb

## 2022-11-10 DIAGNOSIS — Z79899 Other long term (current) drug therapy: Secondary | ICD-10-CM | POA: Insufficient documentation

## 2022-11-10 DIAGNOSIS — M0579 Rheumatoid arthritis with rheumatoid factor of multiple sites without organ or systems involvement: Secondary | ICD-10-CM | POA: Insufficient documentation

## 2022-11-10 DIAGNOSIS — M170 Bilateral primary osteoarthritis of knee: Secondary | ICD-10-CM | POA: Insufficient documentation

## 2022-11-10 NOTE — Patient Instructions (Addendum)
It was a pleasure seeing you in clinic today.    Hold methotrexate a week before your surgery and resume a week after if you have no infectious complication  Follow up in 2 months    If you have any questions or concerns before your next appointment, please do not hesitate to contact me. Our clinic phone number is [phone] and I am also available via e[employer].

## 2022-11-10 NOTE — Progress Notes (Signed)
[address] [facility] [address]  [address]  [place], [place]  [address]  TEL: [phone]  l  FAX: [phone]    Follow Up Clinic Note  11/10/2022    IDENTIFYING DATA/CC:    Caitlin Wiley is a very pleasant 72 year old female who presents for follow up of rheumatoid arthritis, osteoarthritis.    RHEUMATOLOGIC HISTORY OF PRESENT ILLNESS:  Diagnosis: RA, OA; initial contult 11/30/2017  Presenting Symptoms/Timeline: 2013 joint pain hands, wrists, knees, shoulders  Autoimmune Serologies: RF 140, CCP 16; CRP 17, ESR 23; UA 4.6  Pertinent Radiology: Xrays of hands, wrists, knees, feet: No erosions, consistent with OA, chondrocalcinosis  Medications Used: MXT 11/2017  Significant Flares:  Immunization Status: Influenza, PCV13, PPSV23, Zoster  Infectious Disease Screening: Hepatitis B NR, Hepatitis C, TB quantiferon NR  Other: Afib on chronic anticoag,     INTERVAL HISTORY:  Patient seen today for follow-up of rheumatoid arthritis, osteoarthritis.  She was last seen 3 months ago at which time she reported stable disease.  Her main complaints were related to degenerative arthritis in her hands and knees.  These are activity related.  Patient is on methotrexate monotherapy for treatment of rheumatoid arthritis.    Patient's laparoscopic cholecystectomy got delayed due to patient forgetting to hold her blood thinner.  She is scheduled for November 13 instead.  Recall she is getting laparoscopic cholecystectomy due to biliary colic.  She has lost more than 20 lbs in setting of post prandial abdominal discomfort with fatty foods. She has resumed methotrexate and will take for 2 weeks prior to holding again in setting of upcoming surgery.  Patient states her joints are stable despite holding methotrexate.  She only began having increased arthralgias recently.  She did use this time to get updated vaccines.  She is otherwise tolerating methotrexate without serious infectious complications.    REVIEW OF SYSTEMS:  Complete  ROS is negative, except for those mentioned above in the Interval History    PROBLEM LIST:  Patient Active Problem List    Diagnosis Date Noted    Immunosuppression (HCC) [D84.9] 11/20/2020    Lichen sclerosus [L90.0] 04/30/2020    Postmenopausal bleeding [N95.0] 01/10/2020    Cervical polyp [N84.1] 01/10/2020    Vulvar lesion [N90.89] 01/10/2020    Rheumatoid arthritis involving multiple sites with positive rheumatoid factor (HCC) [M05.79] 02/08/2018    Primary osteoarthritis of both knees [M17.0] 09/12/2017    Chronic anticoagulation [Z79.01] 08/02/2015    Right cervical radiculopathy [M54.12] 05/30/2015    Morbid obesity (HCC) [E66.01] 05/30/2015    Paroxysmal atrial fibrillation (HCC) [I48.0] 04/17/2014     A. S/p ablation (Cryo PVI) 10/19/2016      GERD (gastroesophageal reflux disease) [K21.9] 11/08/2012    OA (osteoarthritis) [M19.90] 07/28/2012    Asthma (HCC) [J45.909] 07/24/2012    Edema [R60.9] 07/24/2012    Sleep apnea [G47.30] 07/24/2012    Pulmonary nodule [R91.1] 07/24/2012    Grief reaction [F43.21] 07/24/2012     05/11/2011         SOCIAL/FAMILY HISTORY:  Reviewed with patient. No changes from what is currently noted in EPIC    ALLERGIES:  Review of patient's allergies indicates:  Allergies   Allergen Reactions    Bee Venom Anaphylaxis    Ciprofloxacin Other     Joint aching    Pneumococcal Vaccines Skin: Hives    Sulfa Antibiotics Skin: Hives    Cats [Animals] Skin: Itching    Dust Mite Extract Skin:  Itching     DUST    Pollen Extract Skin: Itching       MEDICATIONS:  Current Outpatient Medications   Medication Sig Dispense Refill    acetaminophen 500 MG tablet Take 1 tablet (500 mg) by mouth 2 times a day.      albuterol HFA 108 (90 Base) MCG/ACT inhaler inhale 1 to 2 puffs by mouth every 6 hours if needed for shortness of breath or wheezing 8.5 g 3    apixaban (Eliquis) 5 MG tablet Take 1 tablet (5 mg) by mouth 2 times a day. 180 tablet 3    Cholecalciferol (VITAMIN D3 OR) Take by mouth.       clobetasol 0.05 % ointment apply topically to VULVAR AREA THREE TIMES WEEKLY 60 g 3    famotidine 20 MG tablet Take 1 tablet (20 mg) by mouth 2 times a day. 180 tablet 3    Flaxseed, Linseed, (FLAXSEED OIL) 1200 MG Oral Cap None Entered      folic acid 1 MG tablet take 3 tablets by mouth once daily 270 tablet 3    GLUCOSAMINE CHONDROITIN COMPLX OR       Magnesium 400 MG Oral Cap Take 1 capsule (400 mg) by mouth.      methotrexate 2.5 MG tablet take 8 tablets by mouth every week 96 tablet 0    montelukast 10 MG tablet Take 1 tablet (10 mg) by mouth daily. 90 tablet 3    Multiple Vitamins-Minerals (MULTIVITAMIN OR) no iron      other medication (see sig/instructions) Medication name: Renette Butters Revive - inflammatory response      Probiotic Product (PROBIOTIC DAILY OR)       SIMETHICONE-80 OR Take 2-3 tablets by mouth daily as needed.      triamterene-hydroCHLOROthiazide 37.5-25 MG tablet Take 1 tablet by mouth daily. 90 tablet 3     No current facility-administered medications for this visit.       PHYSICAL EXAMINATION:  Vital signs:  BP 139/68   Pulse 83   Wt (!) 119.3 kg (263 lb)   LMP  (LMP Unknown)   SpO2 99%   BMI 42.45 kg/m   Wt Readings from Last 6 Encounters:   11/10/22 (!) 119.3 kg (263 lb)   08/03/22 (!) 129.3 kg (285 lb)   06/09/22 (!) 127.9 kg (282 lb)   03/22/22 (!) 126.6 kg (279 lb)   02/02/22 (!) 128.8 kg (284 lb)   11/11/21 (!) 127.9 kg (282 lb)      General:  Awake, alert, and oriented, no apparent distress, pleasant, and cooperative  Psychologic:  Mood is euthymic, affect is congruent  EYES: Anicteric sclera, no conjunctival injection  ENT:  Normocephalic, atraumatic, moist membranes, no oral ulcers   Pulmonary:  Non-labored breathing, no wheezes, rhonchi, or rales  Cardiovascular:  RRR, no murmurs, rubs or gallops  Skin:  Normal temperature and texture, No visible rashes, ulcers, or nodules  Neurologic:  Light touch sensation is grossly intact. Face symmetric   Musculoskeletal:    Gait: Uses a  four wheel walker  Joints: No evidence of synovitis, joint swelling, or tenderness in the joints.  Nails: No pitting, clubbing, or cyanosis noted in the fingernails bilaterally   Muscle strength: Strength is grossly 5 out of 5 throughout the bilateral upper and lower extremities    LABS:    Reviewed outside labs from 09/17/2022.   These are stable.     Results for orders placed or performed in visit on  08/03/22   CBC with Diff    Collection Time: 08/03/22 10:40 AM   Result Value Ref Range    WBC 10.89 (H) 4.3 - 10.0 10*3/uL    RBC 4.91 3.80 - 5.00 10*6/uL    Hemoglobin 14.2 11.5 - 15.5 g/dL    Hematocrit 44 09.8 - 45.0 %    MCV 90 81 - 98 fL    MCH 28.9 27.3 - 33.6 pg    MCHC 32.2 32.2 - 36.5 g/dL    Platelet Count 119 147 - 400 10*3/uL    RDW-CV 15.4 (H) 11.0 - 14.5 %    % Neutrophils 75 %    % Lymphocytes 17 %    % Monocytes 6 %    % Eosinophils 1 %    % Basophils 1 %    % Immature Granulocytes 0 %    Neutrophils 8.16 (H) 1.80 - 7.00 10*3/uL    Absolute Lymphocyte Count 1.82 1.00 - 4.80 10*3/uL    Monocytes 0.66 0.00 - 0.80 10*3/uL    Absolute Eosinophil Count 0.14 0.00 - 0.50 10*3/uL    Basophils 0.08 0.00 - 0.20 10*3/uL    Immature Granulocytes 0.03 0.00 - 0.05 10*3/uL    Nucleated RBC 0.00 0.00 10*3/uL    % Nucleated RBC 0 %   Comprehensive Metabolic Panel    Collection Time: 08/03/22 10:40 AM   Result Value Ref Range    Sodium 138 135 - 145 meq/L    Potassium 4.3 3.6 - 5.2 meq/L    Chloride 103 98 - 108 meq/L    Carbon Dioxide, Total 27 22 - 32 meq/L    Anion Gap 8 4 - 12    Glucose 100 62 - 125 mg/dL    Urea Nitrogen 19 8 - 21 mg/dL    Creatinine 8.29 (H) 0.38 - 1.02 mg/dL    Protein (Total) 6.8 6.0 - 8.2 g/dL    Albumin 4.2 3.5 - 5.2 g/dL    Bilirubin (Total) 0.7 0.2 - 1.3 mg/dL    Calcium 9.6 8.9 - 56.2 mg/dL    AST (GOT) 17 9 - 38 U/L    Alkaline Phosphatase (Total) 75 38 - 172 U/L    ALT (GPT) 16 7 - 33 U/L    eGFR by CKD-EPI 2021 57 (L) >59 mL/min/1.73_m2   C-Reactive Protein    Collection Time: 08/03/22  10:40 AM   Result Value Ref Range    C_Reactive Protein 9.4 0.0 - 10.0 mg/L       RADIOLOGY:   None    DISEASE ACTIVITY:  Patient Function: 0  Patient Pain Assessment: 2  Patient Global Assessment: 4   Combined Rapid 3 Score: 6    ASSESSMENT AND PLAN:  Kerrianne Canete is a very pleasant 72 year old female who presents for follow up of rheumatoid arthritis, osteoarthritis.    #. Rheumatoid arthritis. Seropositive, nonerosive RA. Doing much better since MTX.  No evidence of synovitis on exam today. Knee pain, CMC joint pain sounds mechanical in nature and likely related to osteoarthritis.  We previously discussed therapeutic options including corticosteroid or hyaluronic acid derivative injection in the future.  She is a candidate for total knee replacement surgery once she is able to reach her target BMI.       Stable disease.  Patient can take Tylenol as needed which has been helpful for osteoarthritis.  NSAIDs are not favored due to chronic anticoagulation although likely safe if taken occasionally.  We also  discussed addition of DMARD such as hydroxychloroquine, sulfasalazine.  Patient would like to continue current therapy for now.    While laparoscopic cholecystectomy is considered low risk surgery in terms of infection risk, we will hold MTX a week prior and after. She is able to tolerating holding due to stable disease.      - continue methotrexate 20mg  once a week   - continue folic acid 2mg  daily     #. High risk medication. MTX toxicity labs reviewed from less than 2 months ago.  Stable.     #. Osteoarthritis. Most bothersome in the knees and is activity related. She is interested in TKR but will need to lose some weight first.       Recent discussed restarting physical therapy.  She has difficulty doing exercises at home.  I am concerned about her ambulation.  She also climbs multiple stairs a day.  Referral to PT was provided today.     #. Left cubital tunnel neuropathy. Discussed conservative  management.     #. Immunization. She we will get updated flu and COVID-vaccine later this fall.     #. Bone health. Last DEXA from 2017 was normal. DEXA 06/2018 normal.      - optimization of calcium and vitamin D   - daily weight bearing exercises        All the patient's questions were answered. The patient will follow up in 2 months.    Dictated using Child psychotherapist and electronically signed by Vilinda Flake, MD   Note: This document may contain errors inherent to this technology. If you find any errors that might affect patient care, please contact our office as soon as possible.   [address], [place], [place] [address]  Ph. [phone] Fx. [phone]

## 2022-12-01 ENCOUNTER — Ambulatory Visit: Payer: Medicare HMO | Admitting: Rheumatology

## 2022-12-06 ENCOUNTER — Other Ambulatory Visit (INDEPENDENT_AMBULATORY_CARE_PROVIDER_SITE_OTHER): Payer: Self-pay | Admitting: Family Medicine

## 2022-12-06 ENCOUNTER — Other Ambulatory Visit (HOSPITAL_BASED_OUTPATIENT_CLINIC_OR_DEPARTMENT_OTHER): Payer: Self-pay | Admitting: Rheumatology

## 2022-12-06 DIAGNOSIS — L900 Lichen sclerosus et atrophicus: Secondary | ICD-10-CM

## 2022-12-06 DIAGNOSIS — I480 Paroxysmal atrial fibrillation: Secondary | ICD-10-CM

## 2022-12-06 DIAGNOSIS — K219 Gastro-esophageal reflux disease without esophagitis: Secondary | ICD-10-CM

## 2022-12-06 DIAGNOSIS — J4520 Mild intermittent asthma, uncomplicated: Secondary | ICD-10-CM

## 2022-12-06 DIAGNOSIS — M0579 Rheumatoid arthritis with rheumatoid factor of multiple sites without organ or systems involvement: Secondary | ICD-10-CM

## 2022-12-06 DIAGNOSIS — R600 Localized edema: Secondary | ICD-10-CM

## 2022-12-08 MED ORDER — APIXABAN 5 MG OR TABS
5.0000 mg | ORAL_TABLET | Freq: Two times a day (BID) | ORAL | 0 refills | Status: DC
Start: 2022-12-08 — End: 2022-12-15

## 2022-12-08 MED ORDER — CLOBETASOL PROPIONATE 0.05 % EX OINT
TOPICAL_OINTMENT | CUTANEOUS | 0 refills | Status: DC
Start: 2022-12-08 — End: 2022-12-15

## 2022-12-08 MED ORDER — FAMOTIDINE 20 MG OR TABS
20.0000 mg | ORAL_TABLET | Freq: Two times a day (BID) | ORAL | 0 refills | Status: DC
Start: 2022-12-08 — End: 2022-12-15

## 2022-12-08 MED ORDER — TRIAMTERENE-HCTZ 37.5-25 MG OR TABS
1.0000 | ORAL_TABLET | Freq: Every day | ORAL | 0 refills | Status: DC
Start: 2022-12-08 — End: 2022-12-15

## 2022-12-08 MED ORDER — MONTELUKAST SODIUM 10 MG OR TABS
10.0000 mg | ORAL_TABLET | Freq: Every day | ORAL | 0 refills | Status: DC
Start: 2022-12-08 — End: 2022-12-15

## 2022-12-08 NOTE — Telephone Encounter (Signed)
 Patient is due for yearly exam/lab. Last visit 02/02/22    Please schedule follow up visit. Thank you for your assistance.

## 2022-12-08 NOTE — Telephone Encounter (Signed)
MyChart sent.

## 2022-12-09 MED ORDER — FOLIC ACID 1 MG OR TABS
3.0000 mg | ORAL_TABLET | Freq: Every day | ORAL | 3 refills | Status: DC
Start: 2022-12-09 — End: 2023-01-27

## 2022-12-09 MED ORDER — METHOTREXATE SODIUM 2.5 MG OR TABS
ORAL_TABLET | ORAL | 0 refills | Status: DC
Start: 2022-12-09 — End: 2022-12-30

## 2022-12-09 NOTE — Telephone Encounter (Signed)
 Last MTX monitoring labs was 09/17/22    Approved 1 refill on this med today.    Defer to prescriber/ clinic to order relevant labs in Epic and inform patient.

## 2022-12-15 NOTE — Addendum Note (Signed)
 Addended by: Madie Reno on: 12/15/2022 04:06 PM     Modules accepted: Orders

## 2022-12-15 NOTE — Telephone Encounter (Addendum)
 Patient requesting 90 day supplies of medication due to her next appointment in January with Dr Deliah Goody. Updated prescriptions. Methotrexate looks like it was sent in as a 90 day supply.

## 2022-12-21 MED ORDER — TRIAMTERENE-HCTZ 37.5-25 MG OR TABS
1.0000 | ORAL_TABLET | Freq: Every day | ORAL | 0 refills | Status: DC
Start: 2022-12-21 — End: 2023-01-27

## 2022-12-21 MED ORDER — FAMOTIDINE 20 MG OR TABS
20.0000 mg | ORAL_TABLET | Freq: Two times a day (BID) | ORAL | 0 refills | Status: DC
Start: 2022-12-21 — End: 2023-01-27

## 2022-12-21 MED ORDER — CLOBETASOL PROPIONATE 0.05 % EX OINT
TOPICAL_OINTMENT | CUTANEOUS | 3 refills | Status: DC
Start: 2022-12-21 — End: 2023-01-27

## 2022-12-21 MED ORDER — APIXABAN 5 MG OR TABS
5.0000 mg | ORAL_TABLET | Freq: Two times a day (BID) | ORAL | 0 refills | Status: DC
Start: 2022-12-21 — End: 2023-01-27

## 2022-12-21 MED ORDER — MONTELUKAST SODIUM 10 MG OR TABS
10.0000 mg | ORAL_TABLET | Freq: Every day | ORAL | 0 refills | Status: DC
Start: 2022-12-21 — End: 2023-01-27

## 2022-12-29 ENCOUNTER — Other Ambulatory Visit (HOSPITAL_BASED_OUTPATIENT_CLINIC_OR_DEPARTMENT_OTHER): Payer: Self-pay | Admitting: Rheumatology

## 2022-12-29 DIAGNOSIS — M0579 Rheumatoid arthritis with rheumatoid factor of multiple sites without organ or systems involvement: Secondary | ICD-10-CM

## 2022-12-30 MED ORDER — METHOTREXATE SODIUM 2.5 MG OR TABS
ORAL_TABLET | ORAL | 0 refills | Status: DC
Start: 2022-12-30 — End: 2023-04-04

## 2022-12-30 NOTE — Telephone Encounter (Signed)
 The requested medication requires your authorization because it is outside of the Refill Authorization Center's protocols: No current labs.    Last CBC, chemistry 08/03/22    If you want to authorize it please indicate refills, sign the order and close the encounter.    If this medication is denied please have your staff inform the patient and schedule an appointment if necessary.

## 2023-01-05 ENCOUNTER — Ambulatory Visit: Payer: Medicare HMO | Attending: Rheumatology | Admitting: Rheumatology

## 2023-01-05 ENCOUNTER — Ambulatory Visit (HOSPITAL_BASED_OUTPATIENT_CLINIC_OR_DEPARTMENT_OTHER): Payer: Medicare HMO

## 2023-01-05 VITALS — BP 147/82 | HR 91 | Wt 254.0 lb

## 2023-01-05 DIAGNOSIS — Z79899 Other long term (current) drug therapy: Secondary | ICD-10-CM | POA: Insufficient documentation

## 2023-01-05 DIAGNOSIS — M0579 Rheumatoid arthritis with rheumatoid factor of multiple sites without organ or systems involvement: Secondary | ICD-10-CM | POA: Insufficient documentation

## 2023-01-05 DIAGNOSIS — M170 Bilateral primary osteoarthritis of knee: Secondary | ICD-10-CM | POA: Insufficient documentation

## 2023-01-05 LAB — CBC, DIFF
% Basophils: 1 %
% Eosinophils: 2 %
% Immature Granulocytes: 1 %
% Lymphocytes: 16 %
% Monocytes: 6 %
% Neutrophils: 74 %
% Nucleated RBC: 0 %
Absolute Eosinophil Count: 0.22 10*3/uL (ref 0.00–0.50)
Absolute Lymphocyte Count: 2.14 10*3/uL (ref 1.00–4.80)
Basophils: 0.1 10*3/uL (ref 0.00–0.20)
Hematocrit: 43 % (ref 36.0–45.0)
Hemoglobin: 14.4 g/dL (ref 11.5–15.5)
Immature Granulocytes: 0.06 10*3/uL — ABNORMAL HIGH (ref 0.00–0.05)
MCH: 30.2 pg (ref 27.3–33.6)
MCHC: 33.2 g/dL (ref 32.2–36.5)
MCV: 91 fL (ref 81–98)
Monocytes: 0.82 10*3/uL — ABNORMAL HIGH (ref 0.00–0.80)
Neutrophils: 9.83 10*3/uL — ABNORMAL HIGH (ref 1.80–7.00)
Nucleated RBC: 0 10*3/uL
Platelet Count: 274 10*3/uL (ref 150–400)
RBC: 4.77 10*6/uL (ref 3.80–5.00)
RDW-CV: 14.2 % (ref 11.0–14.5)
WBC: 13.17 10*3/uL — ABNORMAL HIGH (ref 4.3–10.0)

## 2023-01-05 LAB — COMPREHENSIVE METABOLIC PANEL
ALT (GPT): 12 U/L (ref 7–33)
AST (GOT): 14 U/L (ref 9–38)
Albumin: 4.2 g/dL (ref 3.5–5.2)
Alkaline Phosphatase (Total): 86 U/L (ref 38–172)
Anion Gap: 9 (ref 4–12)
Bilirubin (Total): 0.6 mg/dL (ref 0.2–1.3)
Calcium: 9.8 mg/dL (ref 8.9–10.2)
Carbon Dioxide, Total: 28 meq/L (ref 22–32)
Chloride: 103 meq/L (ref 98–108)
Creatinine: 0.92 mg/dL (ref 0.38–1.02)
Glucose: 93 mg/dL (ref 62–125)
Potassium: 4.1 meq/L (ref 3.6–5.2)
Protein (Total): 7 g/dL (ref 6.0–8.2)
Sodium: 140 meq/L (ref 135–145)
Urea Nitrogen: 26 mg/dL — ABNORMAL HIGH (ref 8–21)
eGFR by CKD-EPI 2021: >60 mL/min/{1.73_m2} (ref >59)

## 2023-01-05 LAB — C_REACTIVE PROTEIN: C_Reactive Protein: 9.2 mg/L (ref 0.0–10.0)

## 2023-01-05 NOTE — Progress Notes (Signed)
 [address] [place] [address]  [address]  [place], [place]  [address]  TEL: [phone]  l  FAX: [phone]    Follow Up Clinic Note  01/05/2023    IDENTIFYING DATA/CC:    Caitlin Wiley is a very pleasant 72 year old female who presents for follow up of rheumatoid arthritis, osteoarthritis.    RHEUMATOLOGIC HISTORY OF PRESENT ILLNESS:  Diagnosis: RA, OA; initial contult 11/30/2017  Presenting Symptoms/Timeline: 2013 joint pain hands, wrists, knees, shoulders  Autoimmune Serologies: RF 140, CCP 16; CRP 17, ESR 23; UA 4.6  Pertinent Radiology: Xrays of hands, wrists, knees, feet: No erosions, consistent with OA, chondrocalcinosis  Medications Used: MXT 11/2017  Significant Flares:  Immunization Status: Influenza, PCV13, PPSV23, Zoster  Infectious Disease Screening: Hepatitis B NR, Hepatitis C, TB quantiferon NR  Other: Afib on chronic anticoag,     INTERVAL HISTORY:  Patient returns for follow-up of rheumatoid arthritis and osteoarthritis.  She was last seen less than 2 months ago at which time she was preparing for a laparoscopic cholecystectomy.  She was experiencing some increased arthralgias in part due to holding methotrexate in preparation for surgery.    Patient had her laparoscopic surgery in mid November.  Surgery went well, no complications.  Surgical wound now healed.  She resumed methotrexate several weeks ago.  Altogether she held her dose for 2 weeks.  She did okay with holding methotrexate until the very end.  Currently she has 10 minutes of morning stiffness.  No significant joint swelling or warmth.  Abdominal pain is improved since surgery.  She has some stress and concerns due to her sick cat.  She does endorse some dry eyes and dry mouth.  She continues to do regular stretches.    REVIEW OF SYSTEMS:  Complete ROS is negative, except for those mentioned above in the Interval History    PROBLEM LIST:  Patient Active Problem List    Diagnosis Date Noted    Immunosuppression (HCC) [D84.9]  11/20/2020    Lichen sclerosus [L90.0] 04/30/2020    Postmenopausal bleeding [N95.0] 01/10/2020    Cervical polyp [N84.1] 01/10/2020    Vulvar lesion [N90.89] 01/10/2020    Rheumatoid arthritis involving multiple sites with positive rheumatoid factor (HCC) [M05.79] 02/08/2018    Primary osteoarthritis of both knees [M17.0] 09/12/2017    Chronic anticoagulation [Z79.01] 08/02/2015    Right cervical radiculopathy [M54.12] 05/30/2015    Morbid obesity (HCC) [E66.01] 05/30/2015    Paroxysmal atrial fibrillation (HCC) [I48.0] 04/17/2014     A. S/p ablation ([facility] PVI) 10/19/2016      GERD (gastroesophageal reflux disease) [K21.9] 11/08/2012    OA (osteoarthritis) [M19.90] 07/28/2012    Asthma (HCC) [J45.909] 07/24/2012    Edema [R60.9] 07/24/2012    Sleep apnea [G47.30] 07/24/2012    Pulmonary nodule [R91.1] 07/24/2012    Grief reaction [F43.21] 07/24/2012     05/11/2011         SOCIAL/FAMILY HISTORY:  Reviewed with patient. No changes from what is currently noted in EPIC    ALLERGIES:  Review of patient's allergies indicates:  Allergies   Allergen Reactions    Bee Venom Anaphylaxis    Ciprofloxacin Other     Joint aching    Pneumococcal Vaccines Skin: Hives    Sulfa Antibiotics Skin: Hives    Cats [Animals] Skin: Itching    Dust Mite Extract Skin: Itching     DUST    Pollen Extract Skin: Itching  MEDICATIONS:  Current Outpatient Medications   Medication Sig Dispense Refill    acetaminophen 500 MG tablet Take 1 tablet (500 mg) by mouth 2 times a day.      albuterol HFA 108 (90 Base) MCG/ACT inhaler inhale 1 to 2 puffs by mouth every 6 hours if needed for shortness of breath or wheezing 8.5 g 3    apixaban (Eliquis) 5 MG tablet Take 1 tablet (5 mg) by mouth 2 times a day. =Please schedule appt= 180 tablet 0    Cholecalciferol (VITAMIN D3 OR) Take by mouth.      clobetasol 0.05 % ointment APPLY TOPICALLY TO AFFECTED AREAS OF VULVA three times a week 60 g 3    famotidine 20 MG tablet Take 1 tablet (20 mg) by mouth 2 times  a day. 180 tablet 0    Flaxseed, Linseed, (FLAXSEED OIL) 1200 MG Oral Cap None Entered      folic acid 1 MG tablet Take 3 tablets (3 mg) by mouth daily. 270 tablet 3    GLUCOSAMINE CHONDROITIN COMPLX OR       Magnesium 400 MG Oral Cap Take 1 capsule (400 mg) by mouth.      methotrexate 2.5 MG tablet take 8 tablets by mouth every week 96 tablet 0    montelukast 10 MG tablet Take 1 tablet (10 mg) by mouth daily. 90 tablet 0    Multiple Vitamins-Minerals (MULTIVITAMIN OR) no iron      other medication (see sig/instructions) Medication name: Renette Butters Revive - inflammatory response (Patient not taking: Reported on 11/10/2022)      Probiotic Product (PROBIOTIC DAILY OR)       SIMETHICONE-80 OR Take 2-3 tablets by mouth daily as needed.      triamterene-hydroCHLOROthiazide 37.5-25 MG tablet Take 1 tablet by mouth daily. 90 tablet 0     No current facility-administered medications for this visit.       PHYSICAL EXAMINATION:  Vital signs:  BP (!) 147/82   Pulse 91   Wt (!) 115.2 kg (254 lb)   LMP  (LMP Unknown)   SpO2 96%   BMI 41.00 kg/m   General:  Awake, alert, and oriented, no apparent distress, pleasant, and cooperative  Psychologic:  Mood is euthymic, affect is congruent  EYES: Anicteric sclera, no conjunctival injection  ENT:  Normocephalic, atraumatic, moist membranes, no oral ulcers   Pulmonary:  Non-labored breathing, no wheezes, rhonchi, or rales  Cardiovascular:  RRR, no murmurs, rubs or gallops  Skin:  Normal temperature and texture, No visible rashes, ulcers, or nodules  Neurologic:  Light touch sensation is grossly intact. Face symmetric   Musculoskeletal:    Gait: Uses a four-wheel walker.  Joints: No evidence of synovitis, joint swelling.  Nails: No pitting, clubbing, or cyanosis noted in the fingernails bilaterally   Muscle strength: Strength is grossly 5 out of 5 throughout the bilateral upper and lower extremities    LABS:    Pending    RADIOLOGY:   None    DISEASE ACTIVITY:  Patient Function:  1.3  Patient Pain Assessment: 4.5  Patient Global Assessment: 2.5   Combined Rapid 3 Score: 8.3    ASSESSMENT AND PLAN:  Caitlin Wiley is a very pleasant 73 year old female who presents for follow up of rheumatoid arthritis and osteoarthritis.    #. Rheumatoid arthritis. Seropositive, nonerosive RA. Doing much better since MTX.  No evidence of synovitis on exam today. Knee pain, CMC joint pain sounds mechanical in nature  and likely related to osteoarthritis.  We previously discussed therapeutic options including corticosteroid or hyaluronic acid derivative injection in the future.  She is a candidate for total knee replacement surgery once she is able to reach her target BMI.       Stable disease.  Patient did well perioperatively.  She has not resumed methotrexate.  She remains in clinical remission.     - continue methotrexate 20mg  once a week   - continue folic acid 2mg  daily     #. High risk medication. MTX toxicity labs today.     #. Osteoarthritis. Most bothersome in the knees and is activity related. She is interested in TKR but will need to lose some weight first.  Patient is getting closer to her goal.   Recent discussed restarting physical therapy.  She has difficulty doing exercises at home.  I am concerned about her ambulation.  She also climbs multiple stairs a day.  Referral to PT was provided today.  We did not follow-up on this.     #. Left cubital tunnel neuropathy. Discussed conservative management.     #. Immunization. She we will get updated flu and COVID-vaccine later this fall.     #. Bone health. Last DEXA from 2017 was normal. DEXA 06/2018 normal.      - optimization of calcium and vitamin D   - daily weight bearing exercises          All the patient's questions were answered. The patient will follow up in 4 months.    Dictated using Child psychotherapist and electronically signed by Vilinda Flake, MD   Note: This document may contain errors inherent to this technology. If you  find any errors that might affect patient care, please contact our office as soon as possible.   [address], [place], [place] [address]  Ph. [phone] Fx. [phone]

## 2023-01-05 NOTE — Patient Instructions (Signed)
It was a pleasure seeing you in clinic today.    Continue your current medications  Get labs after your appointment today  Follow up in 4 months      If you have any questions or concerns before your next appointment, please do not hesitate to contact me. Our clinic phone number is [phone] and I am also available via eCare.

## 2023-01-05 NOTE — Result Encounter Note (Signed)
 Your labs are stable. Your WBC is slightly elevated at 13 but this is not surprising since you had recent surgery and your body is still recovering. It should be back to baseline the next time we check.

## 2023-01-23 IMAGING — MR MRI RIGHT HIP WITHOUT CONTRAST
4 of 6 series · 17 of 40 positions shown · IV contrast (gadolinium)
Comparison: 02/03/2022 radiographs

________________________________________________________________________________________________ 
MRI RIGHT HIP WITHOUT CONTRAST, MRI LEFT HIP WITHOUT CONTRAST, 01/23/2023 [DATE]: 
CLINICAL INDICATION: Bilateral primary osteoarthritis of hip. History of breast 
carcinoma.
TECHNIQUE: Multiplanar, multiecho position MR images of the pelvis and hips were 
performed without intravenous gadolinium enhancement. Small field-of-view 
imaging was performed of the hip.

[Series 301: T1 · axial · 5.0mm · 0.59mm/px · z∈[-167,+47]mm · 3 of 44 slices shown]
[im 5/44]
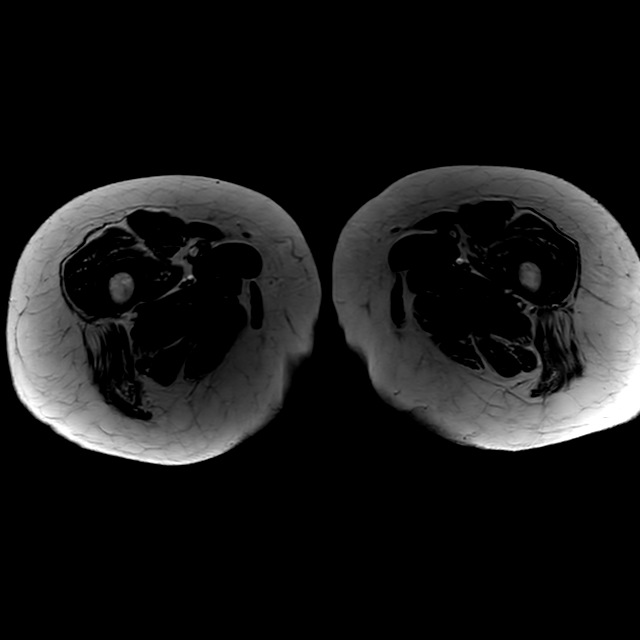
[im 24/44]
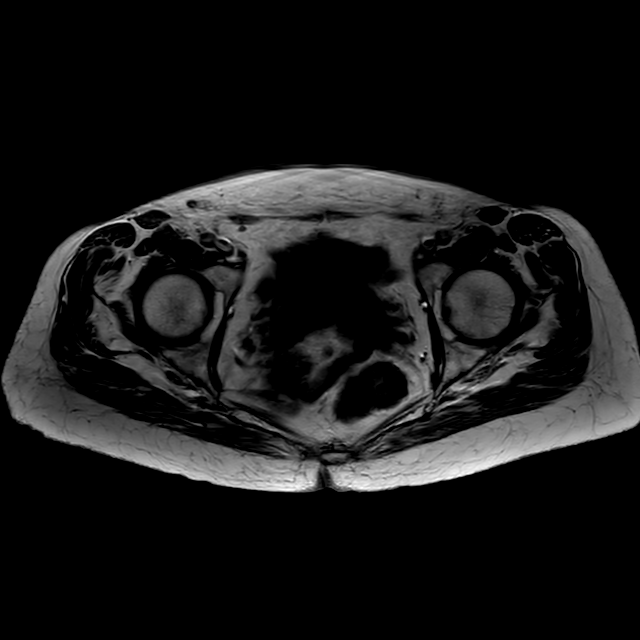
[im 39/44]
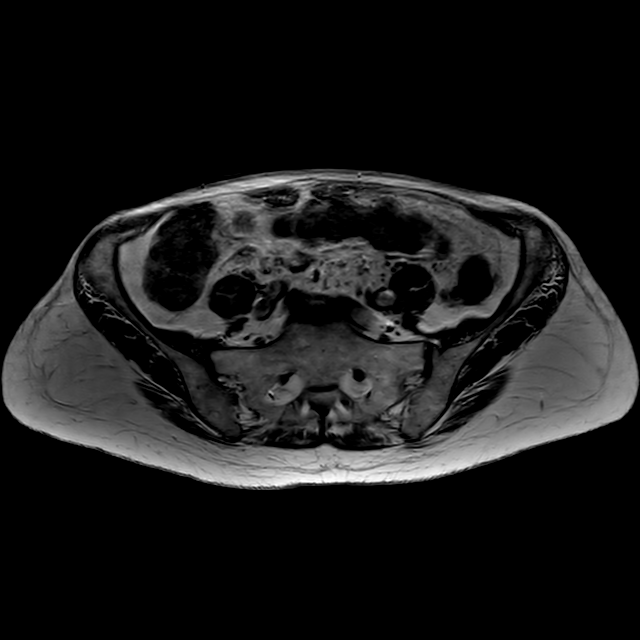

[Series 501: PD fat-sat · axial · 4.0mm · 0.35mm/px · z∈[-136,+7]mm · 7 of 32 slices shown (1 of 2)]
[im 1/32]
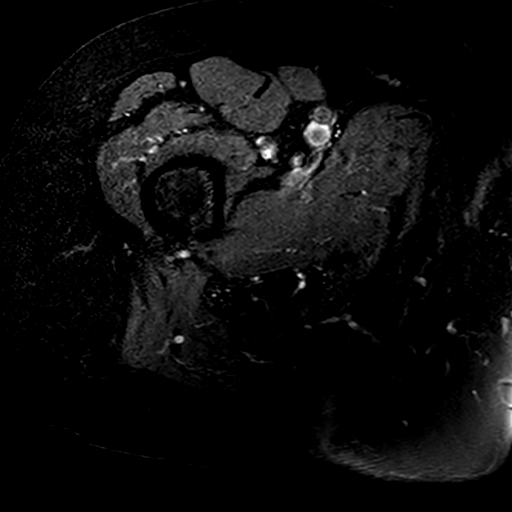
[im 6/32]
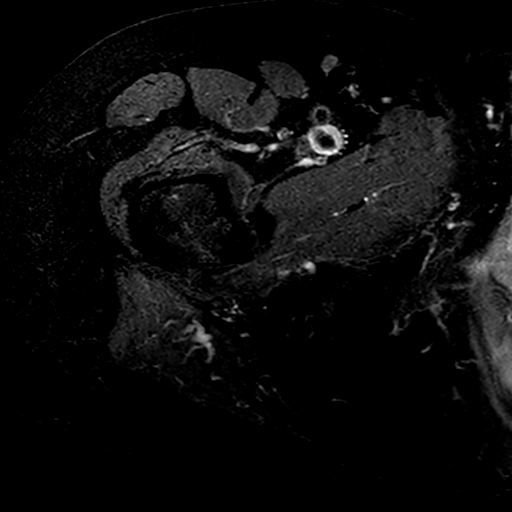
[im 11/32]
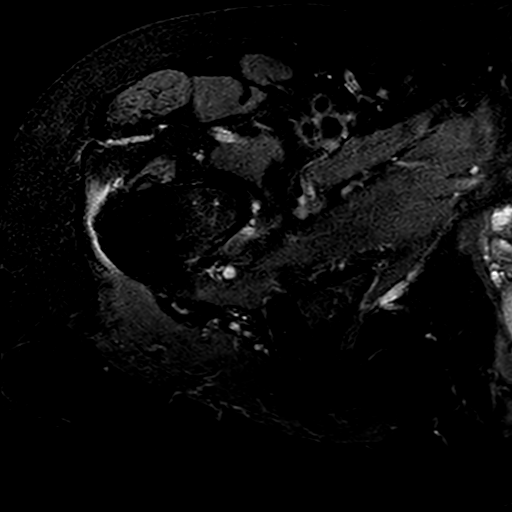
[im 16/32]
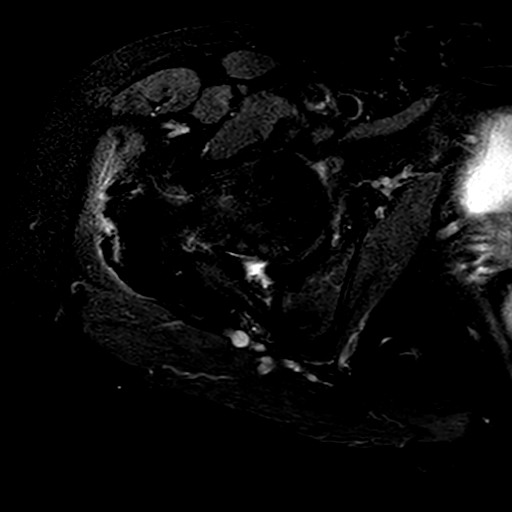
[im 21/32]
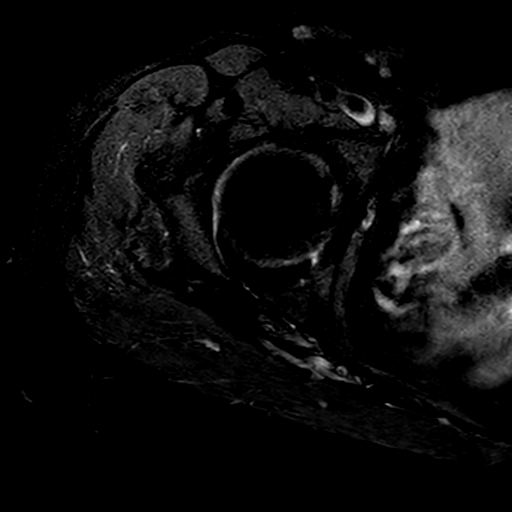
[im 26/32]
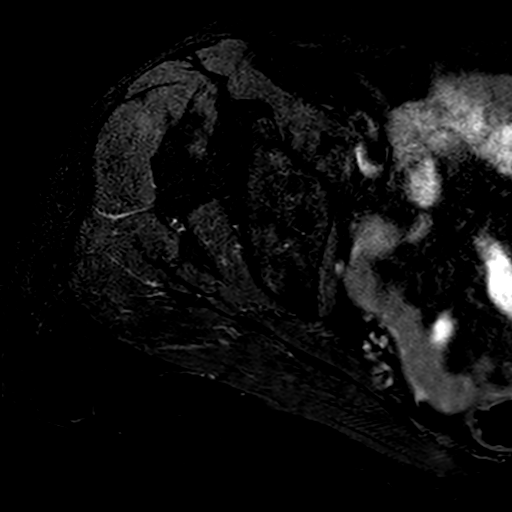
[im 32/32]
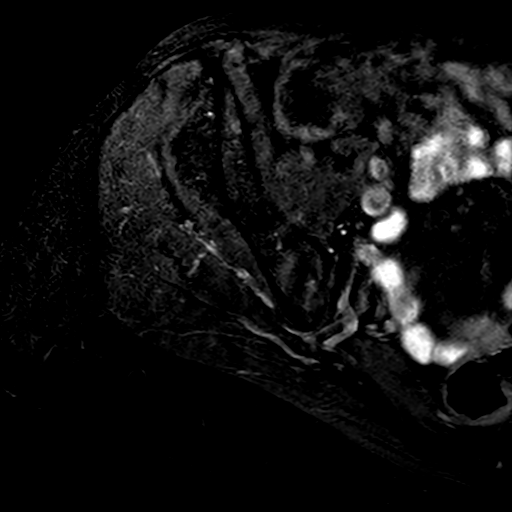

[Series 601: PD · sagittal · 3.5mm · 0.43mm/px · 3 of 36 slices shown]
[im 6/36]
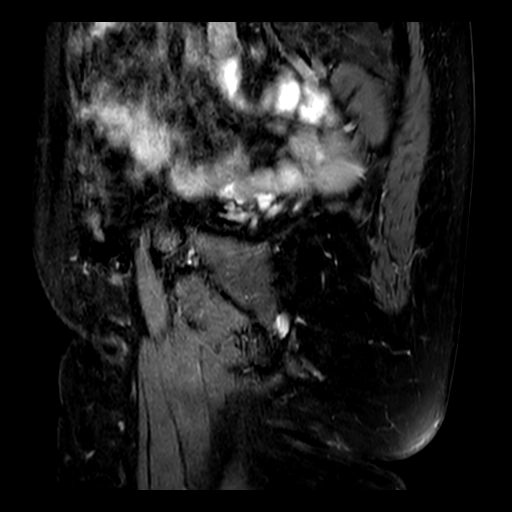
[im 21/36]
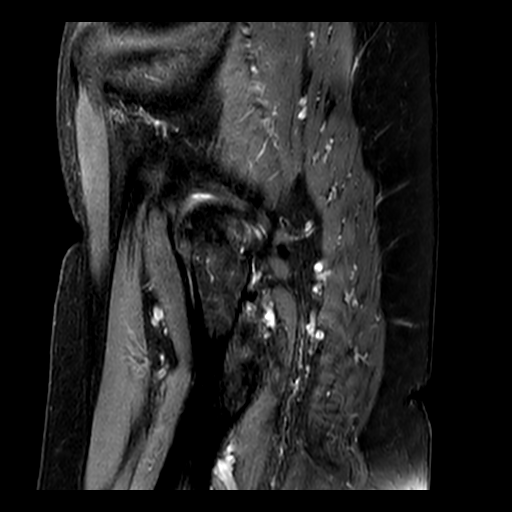
[im 31/36]
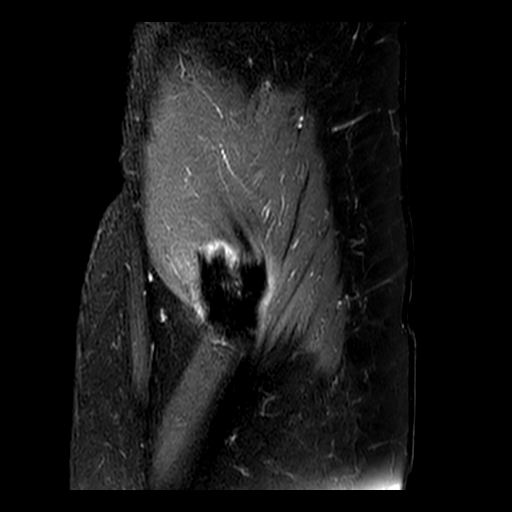

[Series 701: PD fat-sat · coronal · 3.2mm · 0.43mm/px · 4 of 20 slices shown (2 of 2)]
[im 1/20]
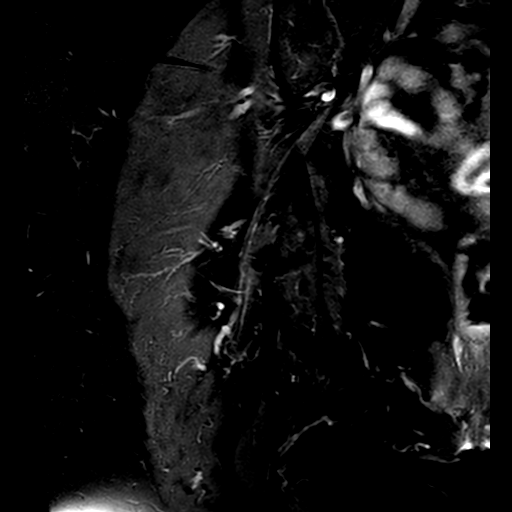
[im 5/20]
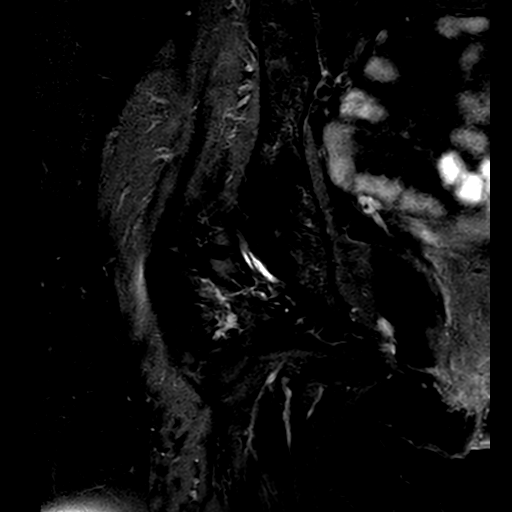
[im 10/20]
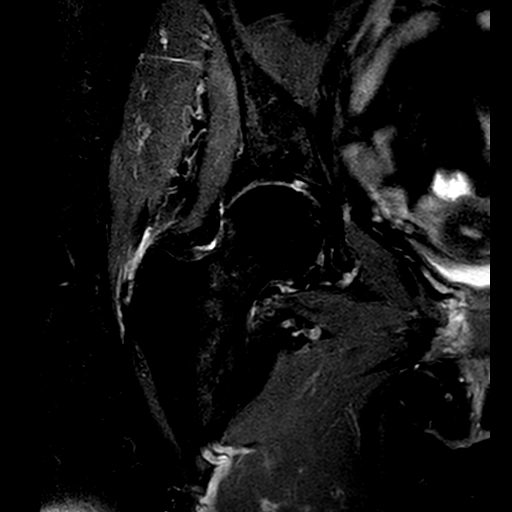
[im 20/20]
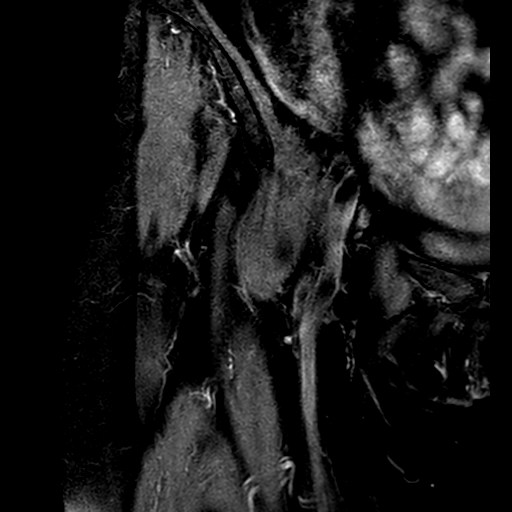

[17 of 40 positions shown; findings below may reference images not displayed]

FINDINGS: RIGHT HIP: Mild degenerative change with partial thickness chondromalacia. No 
labral tear. No paralabral cyst. No hip joint effusion. Femoral head maintains a 
spherical configuration without evidence of avascular necrosis or subarticular 
collapse. No abnormal morphology of the proximal femur or acetabulum to 
predispose to impingement. 
LEFT HIP: Mild degenerative change with partial thickness chondromalacia. No 
labral tear. No paralabral cyst. No hip joint effusion. Femoral head maintains a 
spherical configuration without evidence of avascular necrosis or subarticular 
collapse. No abnormal morphology of the proximal femur or acetabulum to 
predispose to impingement. 
PELVIC BONES: Normal marrow signal intensity. No fracture, contusion or marrow 
replacing lesion.  
SI JOINTS/PUBIC SYMPHYSIS: Mild degenerative change. 
SPINE: Multilevel degenerative change of the spine. 
SOFT TISSUES: Mild tendinosis of the bilateral distal gluteus minimus tendons 
with tendon thickening, intermediate signal and mild 
peritendinous/peritrochanteric edema (greater on the right). The abductor cuffs 
are otherwise preserved without high-grade interstitial tear. Now trochanteric 
bursitis. The origins of the hamstrings are intact. The rectus 
abdominis-adductor aponeurotic complexes are intact. Focal fatty atrophy of the 
gluteus minimus muscles, more marked on the right. No mass, free fluid or 
adenopathy. Colonic diverticulosis. The bladder and pelvic organs are 
unremarkable.
IMPRESSION: 1.  Mild degenerative change of the hips, SI joints and pubic symphysis. 
2.  Mild gluteal tendinosis and peritendinous/peritrochanteric edema, greater on 
the right.  
3.  Degenerative change of the spine.   
4.  Colonic diverticulosis.

## 2023-01-23 IMAGING — MR MRI LEFT HIP WITHOUT CONTRAST
4 of 7 series · 17 of 40 positions shown · IV contrast (gadolinium)
Comparison: 02/03/2022 radiographs

________________________________________________________________________________________________ 
MRI RIGHT HIP WITHOUT CONTRAST, MRI LEFT HIP WITHOUT CONTRAST, 01/23/2023 [DATE]: 
CLINICAL INDICATION: Bilateral primary osteoarthritis of hip. History of breast 
carcinoma.
TECHNIQUE: Multiplanar, multiecho position MR images of the pelvis and hips were 
performed without intravenous gadolinium enhancement. Small field-of-view 
imaging was performed of the hip.

[Series 201: PD fat-sat · axial · 4.0mm · 0.35mm/px · z∈[-88,+55]mm · 7 of 32 slices shown (1 of 2)]
[im 1/32]
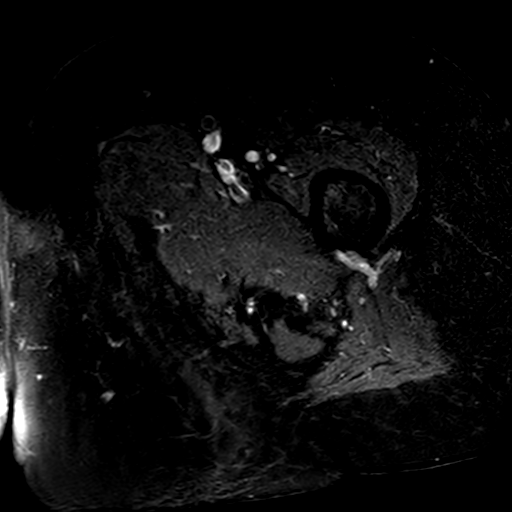
[im 6/32]
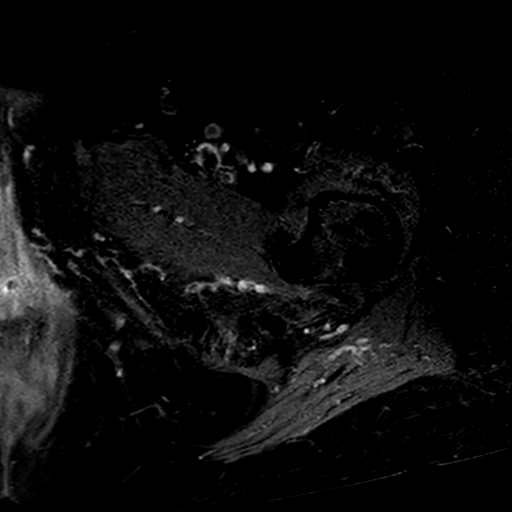
[im 11/32]
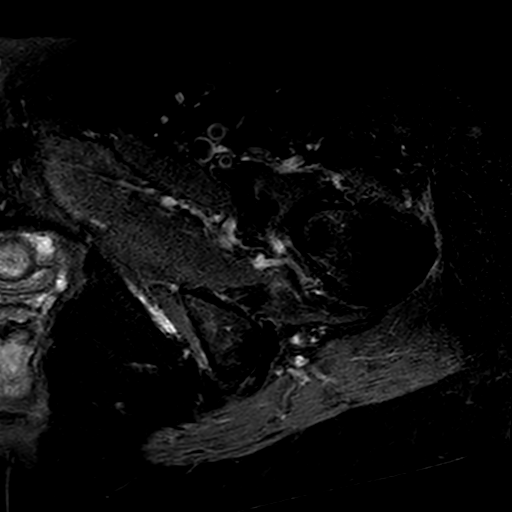
[im 16/32]
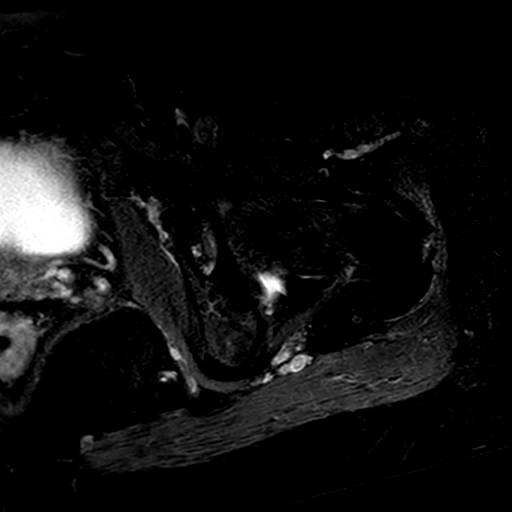
[im 21/32]
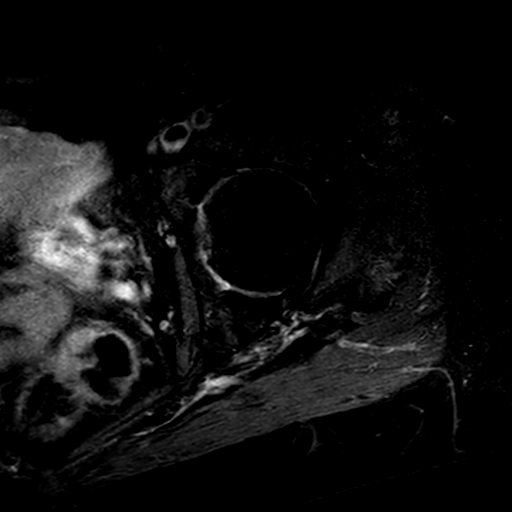
[im 26/32]
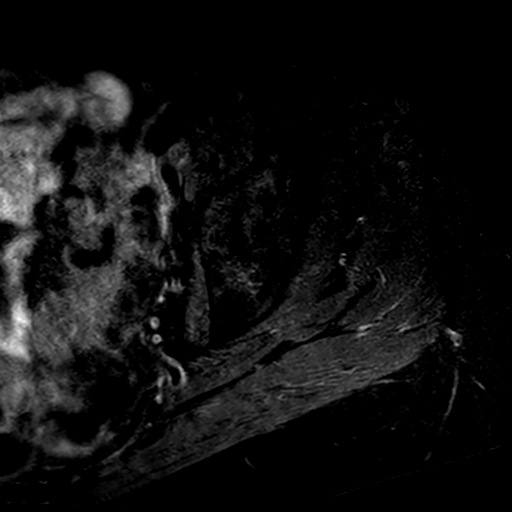
[im 32/32]
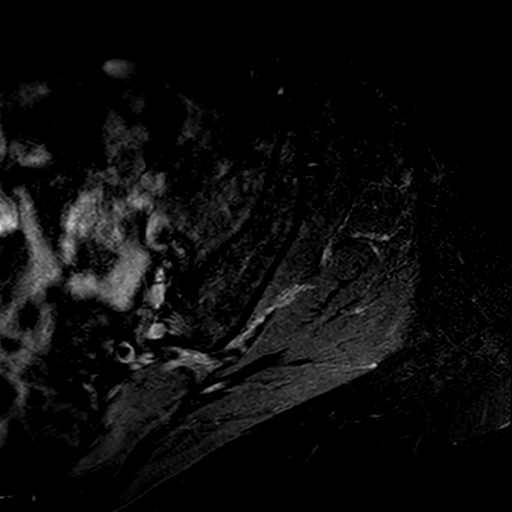

[Series 301: PD · sagittal · 3.5mm · 0.43mm/px · 3 of 36 slices shown]
[im 6/36]
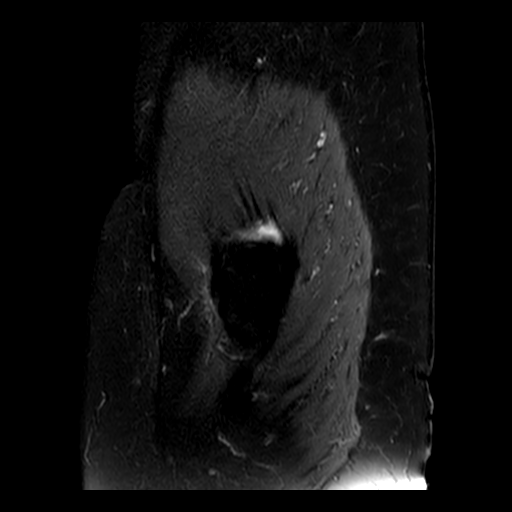
[im 18/36]
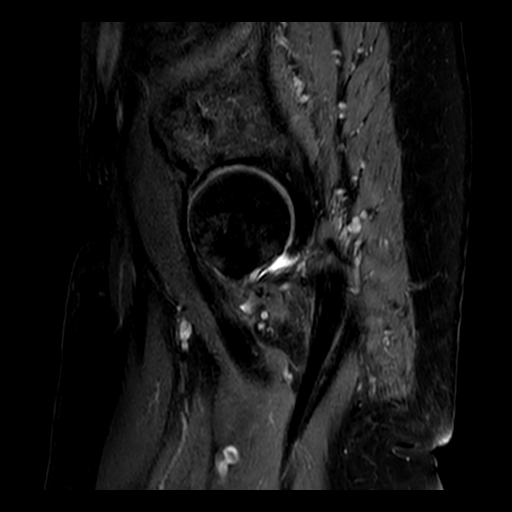
[im 30/36]
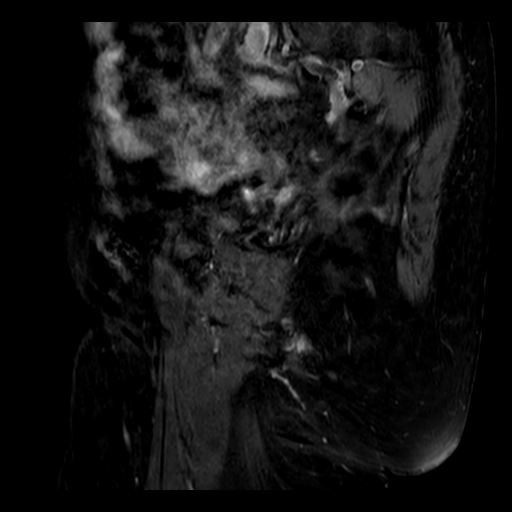

[Series 401: PD fat-sat · coronal · 3.2mm · 0.43mm/px · 4 of 20 slices shown (2 of 2)]
[im 1/20]
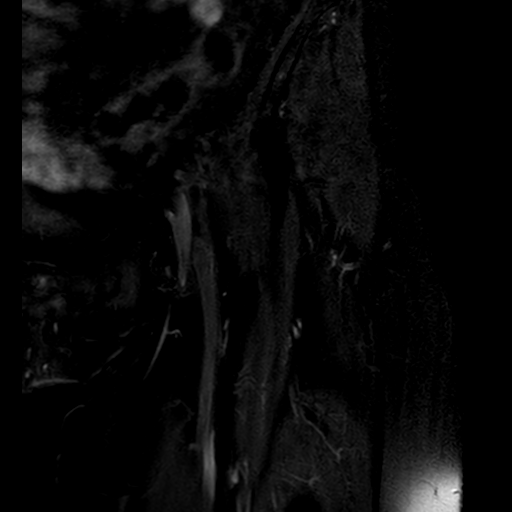
[im 7/20]
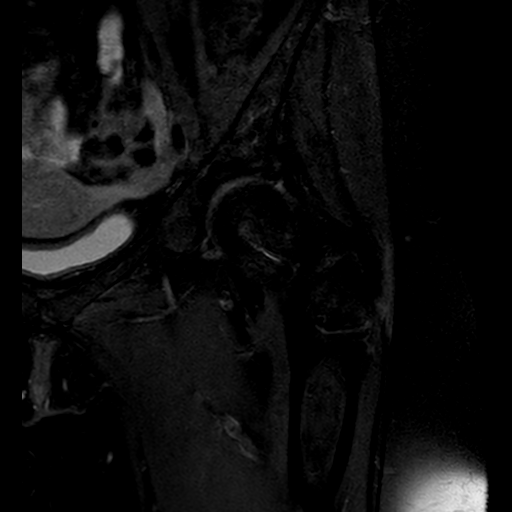
[im 13/20]
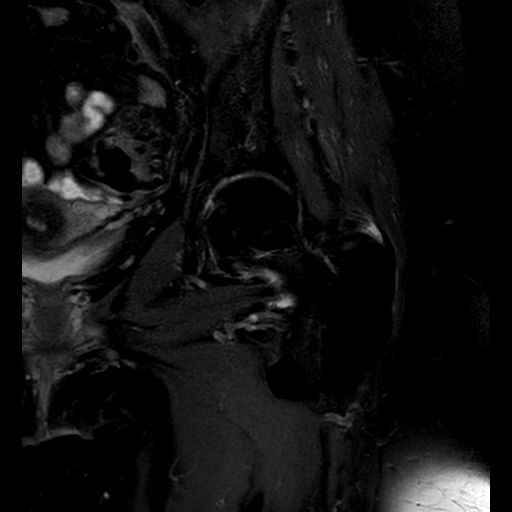
[im 20/20]
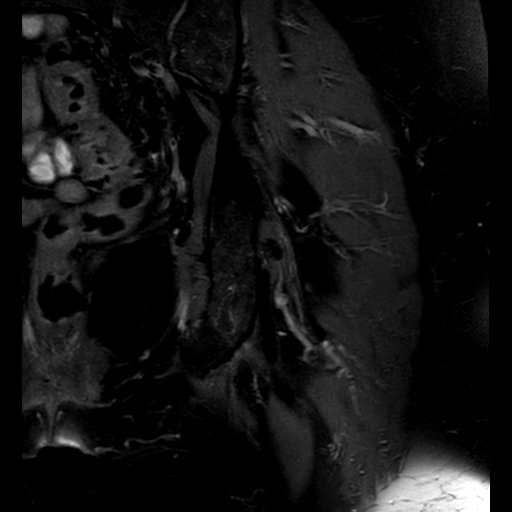

[Series 501: T1 fat-sat · axial · 5.0mm · 0.58mm/px · z∈[-135,+136]mm · 3 of 14 slices shown]
[im 1/14]
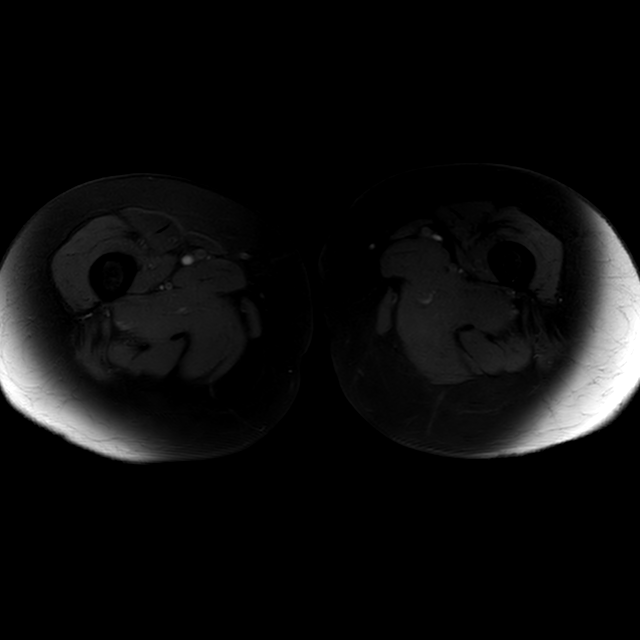
[im 7/14]
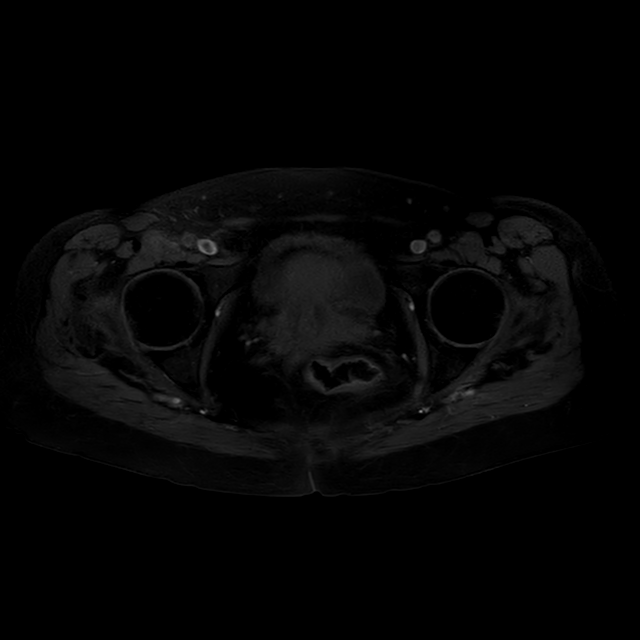
[im 14/14]
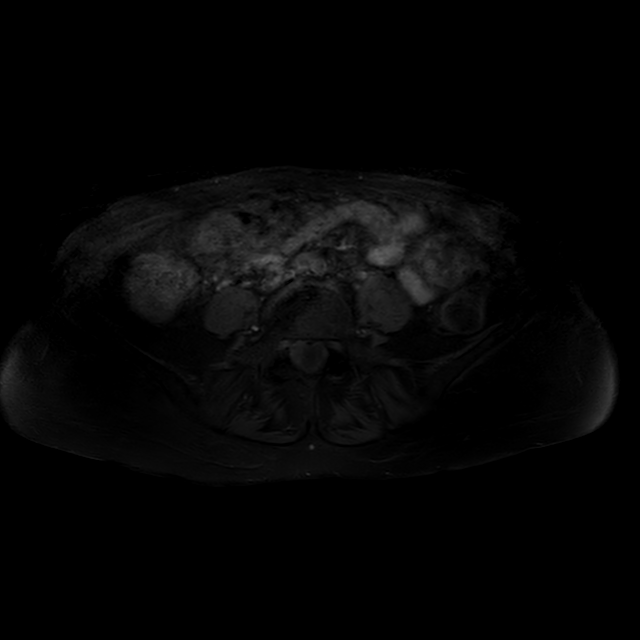

[17 of 40 positions shown; findings below may reference images not displayed]

FINDINGS: RIGHT HIP: Mild degenerative change with partial thickness chondromalacia. No 
labral tear. No paralabral cyst. No hip joint effusion. Femoral head maintains a 
spherical configuration without evidence of avascular necrosis or subarticular 
collapse. No abnormal morphology of the proximal femur or acetabulum to 
predispose to impingement. 
LEFT HIP: Mild degenerative change with partial thickness chondromalacia. No 
labral tear. No paralabral cyst. No hip joint effusion. Femoral head maintains a 
spherical configuration without evidence of avascular necrosis or subarticular 
collapse. No abnormal morphology of the proximal femur or acetabulum to 
predispose to impingement. 
PELVIC BONES: Normal marrow signal intensity. No fracture, contusion or marrow 
replacing lesion.  
SI JOINTS/PUBIC SYMPHYSIS: Mild degenerative change. 
SPINE: Multilevel degenerative change of the spine. 
SOFT TISSUES: Mild tendinosis of the bilateral distal gluteus minimus tendons 
with tendon thickening, intermediate signal and mild 
peritendinous/peritrochanteric edema (greater on the right). The abductor cuffs 
are otherwise preserved without high-grade interstitial tear. Now trochanteric 
bursitis. The origins of the hamstrings are intact. The rectus 
abdominis-adductor aponeurotic complexes are intact. Focal fatty atrophy of the 
gluteus minimus muscles, more marked on the right. No mass, free fluid or 
adenopathy. Colonic diverticulosis. The bladder and pelvic organs are 
unremarkable.
IMPRESSION: 1.  Mild degenerative change of the hips, SI joints and pubic symphysis. 
2.  Mild gluteal tendinosis and peritendinous/peritrochanteric edema, greater on 
the right.  
3.  Degenerative change of the spine.   
4.  Colonic diverticulosis.

## 2023-01-27 ENCOUNTER — Ambulatory Visit (INDEPENDENT_AMBULATORY_CARE_PROVIDER_SITE_OTHER): Payer: Medicare HMO | Admitting: Family Medicine

## 2023-01-27 VITALS — BP 112/70 | Temp 98.6°F | Ht 64.76 in | Wt 254.0 lb

## 2023-01-27 DIAGNOSIS — Z0000 Encounter for general adult medical examination without abnormal findings: Secondary | ICD-10-CM

## 2023-01-27 DIAGNOSIS — K219 Gastro-esophageal reflux disease without esophagitis: Secondary | ICD-10-CM

## 2023-01-27 DIAGNOSIS — M0579 Rheumatoid arthritis with rheumatoid factor of multiple sites without organ or systems involvement: Secondary | ICD-10-CM

## 2023-01-27 DIAGNOSIS — Z7901 Long term (current) use of anticoagulants: Secondary | ICD-10-CM

## 2023-01-27 DIAGNOSIS — R600 Localized edema: Secondary | ICD-10-CM

## 2023-01-27 DIAGNOSIS — L900 Lichen sclerosus et atrophicus: Secondary | ICD-10-CM

## 2023-01-27 DIAGNOSIS — J4520 Mild intermittent asthma, uncomplicated: Secondary | ICD-10-CM

## 2023-01-27 DIAGNOSIS — I480 Paroxysmal atrial fibrillation: Secondary | ICD-10-CM

## 2023-01-27 IMAGING — MR MRI LUMBAR SPINE WITHOUT CONTRAST
5 of 8 series · 8 of 48 positions shown · IV contrast (gadolinium)
Comparison: CT lumbar spine from September 25, 2020.

________________________________________________________________________________________________ 
MRI LUMBAR SPINE WITHOUT CONTRAST, 01/27/2023 [DATE]: 
CLINICAL INDICATION: Radiculopathy, lumbar region . Chronic back pain with 
radiation down both legs.
TECHNIQUE: Multiplanar, multiecho position MR images of the lumbar spine were 
performed without intravenous gadolinium enhancement. Patient was scanned on a 
3T magnet

[Series 101: survey · axial · 10.0mm · 1.39mm/px · 1 of 9 slices shown]
[im 1/9]
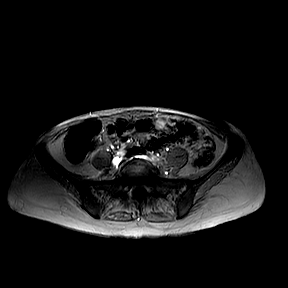

[Series 201: t2w_cor-surv · coronal · 6.0mm · 0.50mm/px · 1 of 6 slices shown]
[im 1/6]
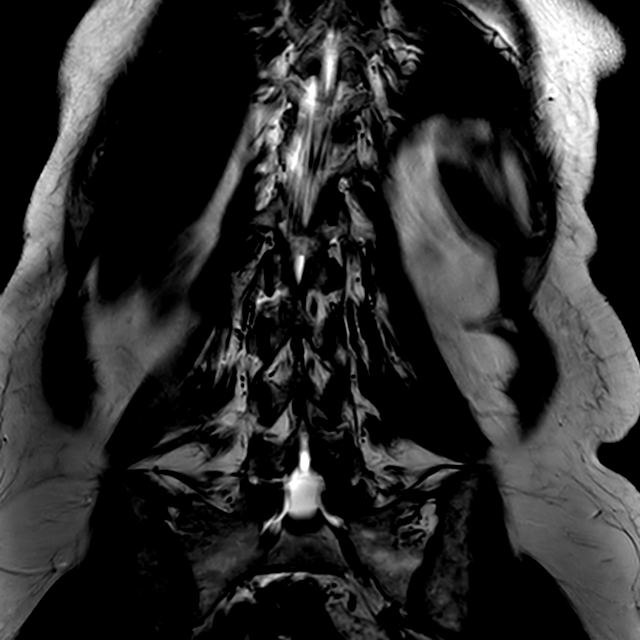

[Series 301: t1w_tse sag · sagittal · 4.1mm · 0.25mm/px · 2 of 17 slices shown]
[im 1/17]
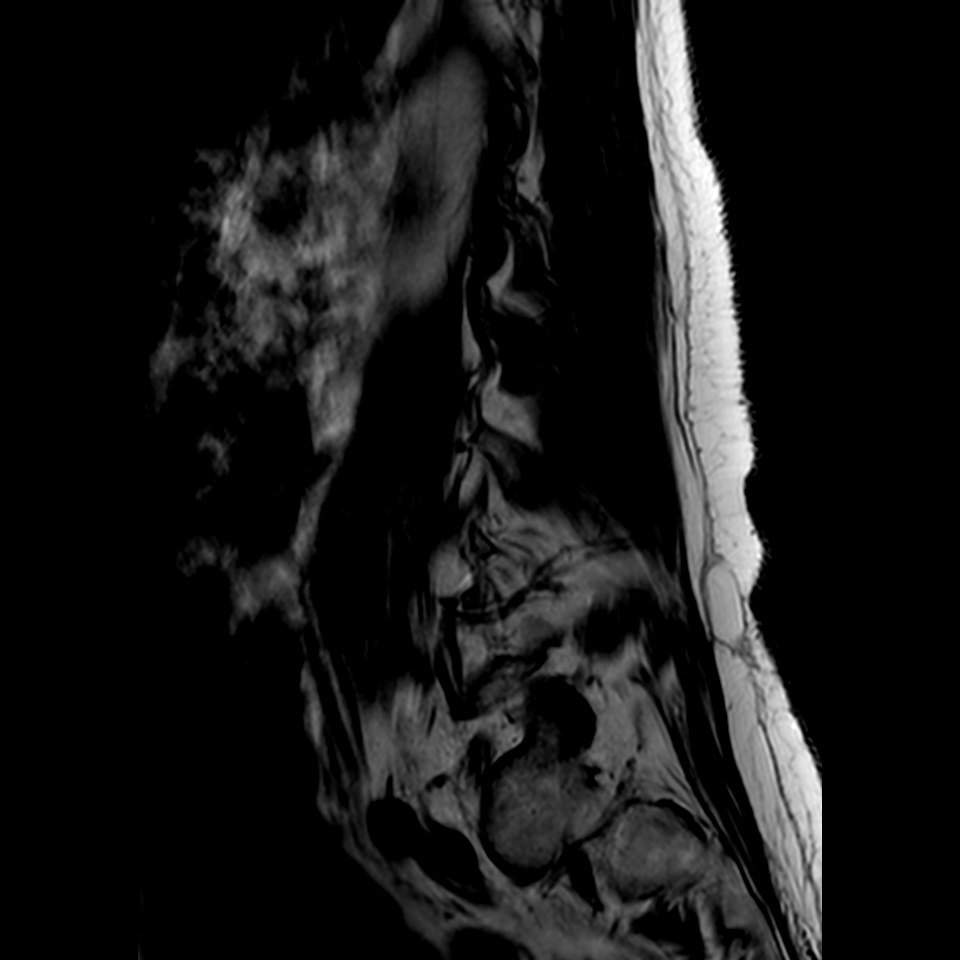
[im 17/17]
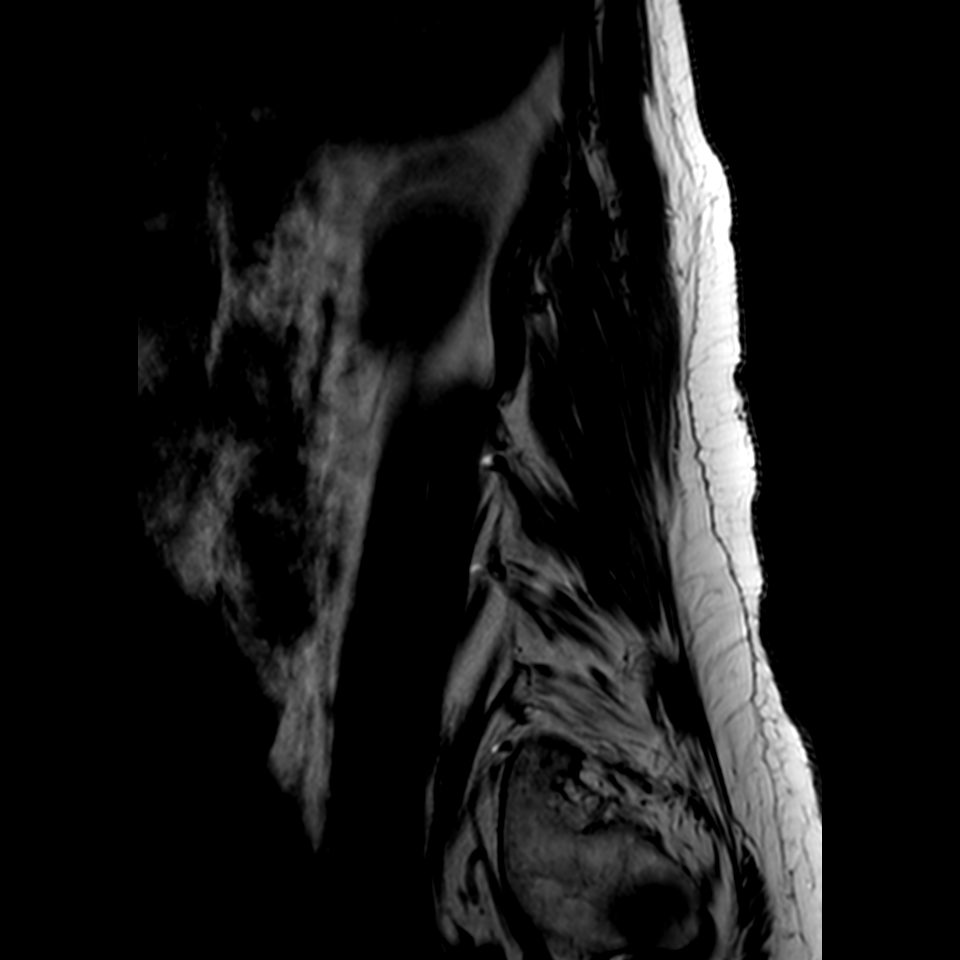

[Series 401: t2w_tse sag · sagittal · 4.1mm · 0.24mm/px · 2 of 17 slices shown]
[im 1/17]
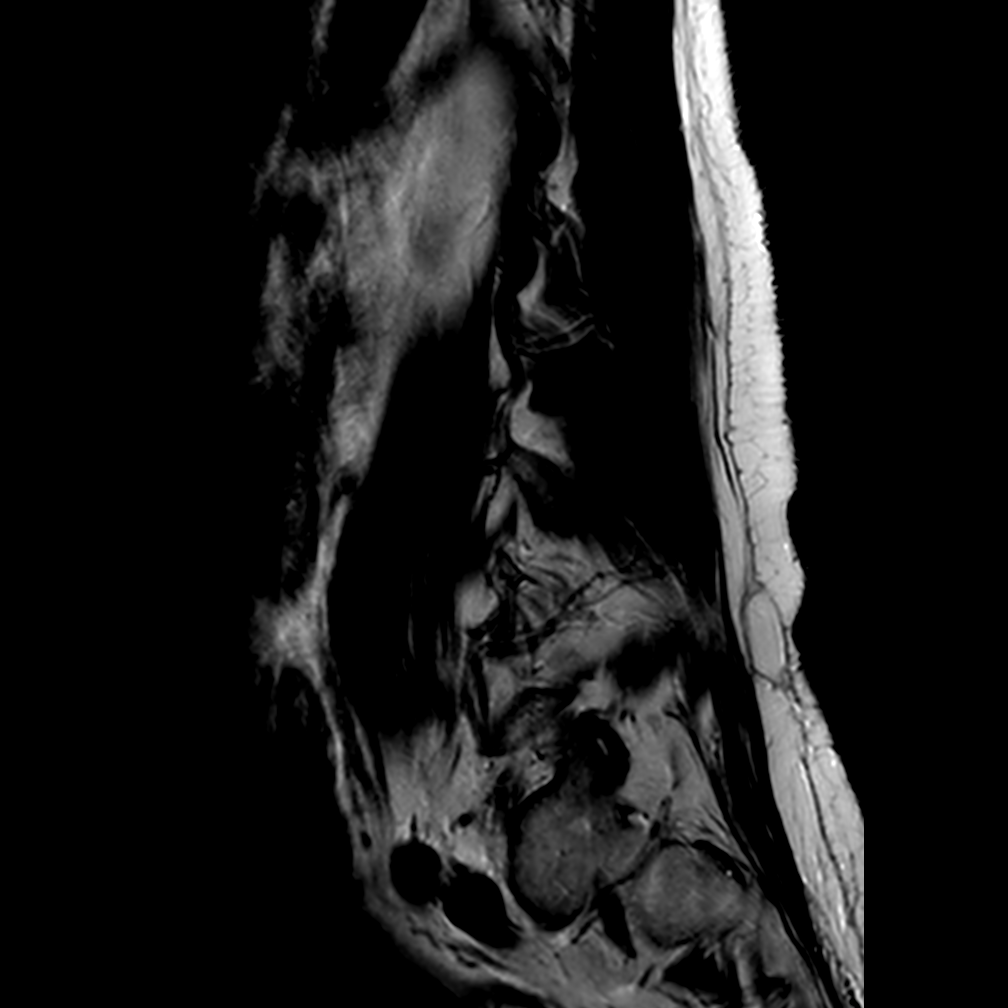
[im 17/17]
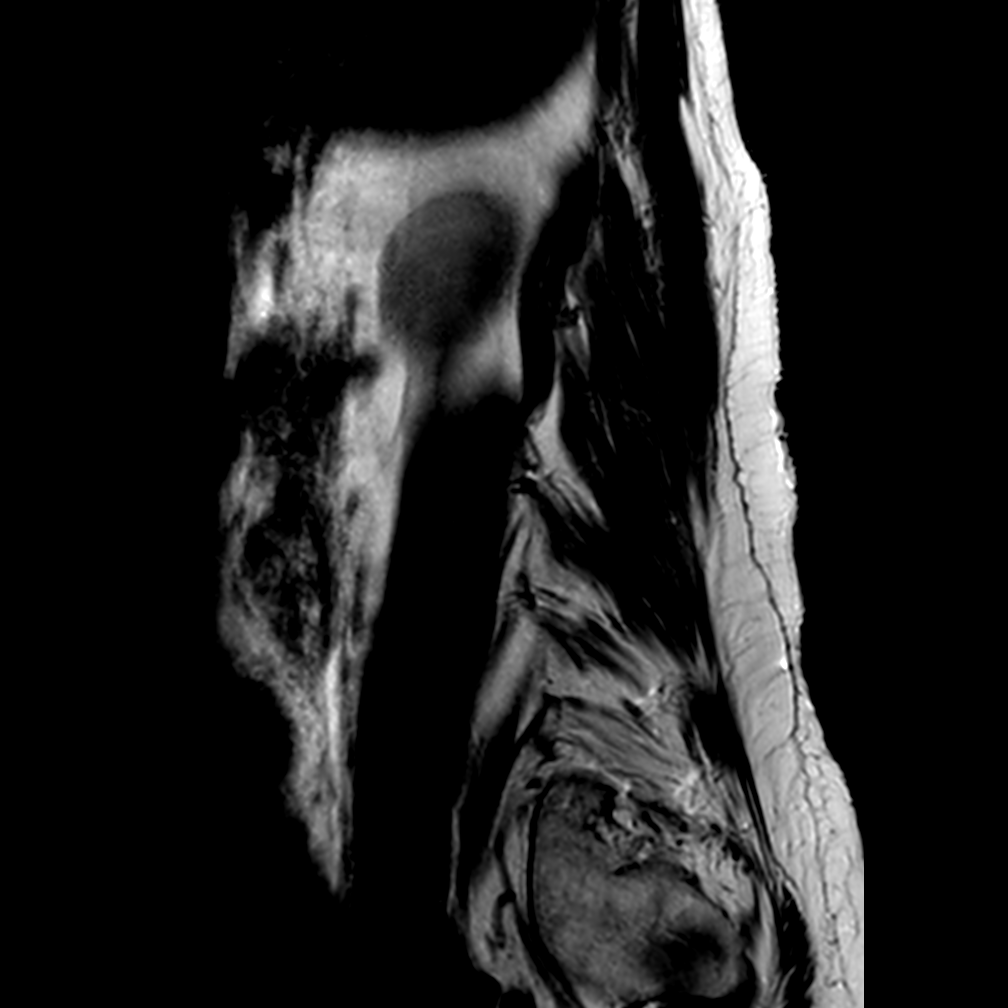

[Series 501: stir_sag-philips · sagittal · 4.0mm · 0.45mm/px · 2 of 17 slices shown]
[im 1/17]
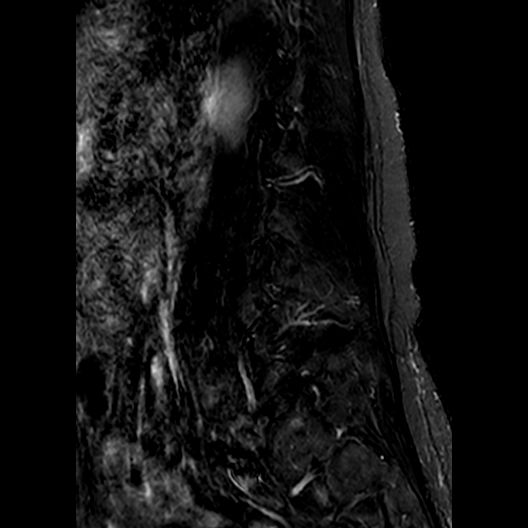
[im 9/17]
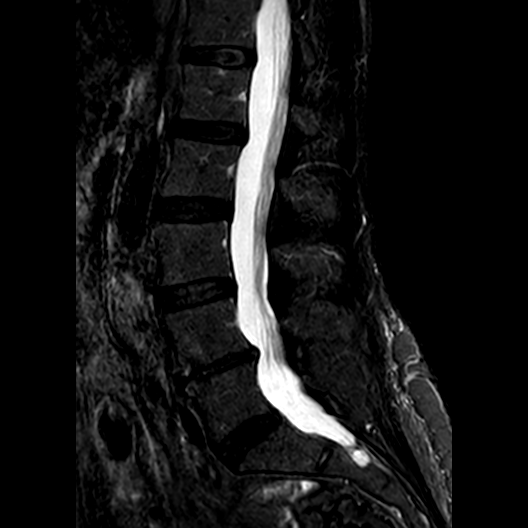

[8 of 48 positions shown; findings below may reference images not displayed]

FINDINGS: -------------------------------------------------------------------------------- 
------ 
GENERAL: 
Nomenclature is based on 5 lumbar type vertebral bodies.     
ALIGNMENT: Rightward curvature centered at L2-L3, mild retrolisthesis of L1 on 
L2, L2 on L3. Mild anterolisthesis of L4 on L5. 
VERTEBRAL BODY HEIGHT: Normal.  
MARROW SIGNAL: No focal suspect signal abnormality. 
CORD SIGNAL: Normal distal spinal cord and cauda equina.  Conus terminates at 
T12-L1. 
ADDITIONAL FINDINGS: None. 
Modic I-II: Modic change at L4-L5. 
Ligamentum Flavum > 2.5 mm: All levels. 
-------------------------------------------------------------------------------- 
------ 
SEGMENTAL: 
T12-L1: Disc bulge without significant central canal narrowing.  No significant 
right neural foraminal narrowing. No significant left neural foraminal 
narrowing.  
L1-L2: Disc bulge without significant central canal narrowing.  No significant 
right neural foraminal narrowing. No significant left neural foraminal 
narrowing.  
L2-L3: Mild bilateral facet hypertrophy, no significant central canal narrowing. 
 No significant right neural foraminal narrowing. No significant left neural 
foraminal narrowing.  
L3-L4: Disc bulge with bilateral facet hypertrophy; no significant central canal 
narrowing noting mild greater than right subarticular recess narrowing.  No 
significant right neural foraminal narrowing. No significant left neural 
foraminal narrowing.  
L4-L5: Loss of disc height with disc bulge and right paracentral to subarticular 
shallow disc herniation, bilateral facet hypertrophy; mild right-sided central 
canal narrowing with moderate right greater than left subarticular recess 
narrowing.  Mild right neural foraminal narrowing. No significant left neural 
foraminal narrowing.  
L5-S1: Trace disc bulge without significant central canal narrowing.  No 
significant right neural foraminal narrowing. No significant left neural 
foraminal narrowing.  
-------------------------------------------------------------------------------- 
------
IMPRESSION: 1.  Discogenic/degenerative changes as above. 
2.  Moderate right greater than left subarticular recess narrowing at L4-L5.

## 2023-01-27 IMAGING — DX SHOULDER LEFT 2 VIEWS
2 series · 2 of 2 positions shown · non-contrast
Comparison: None.

________________________________________________________________________________________________ 
SHOULDER RIGHT 2 VIEWS, SHOULDER LEFT 2 VIEWS, 01/27/2023 [DATE]: 
CLINICAL INDICATION: Shoulder pain.

[AP]
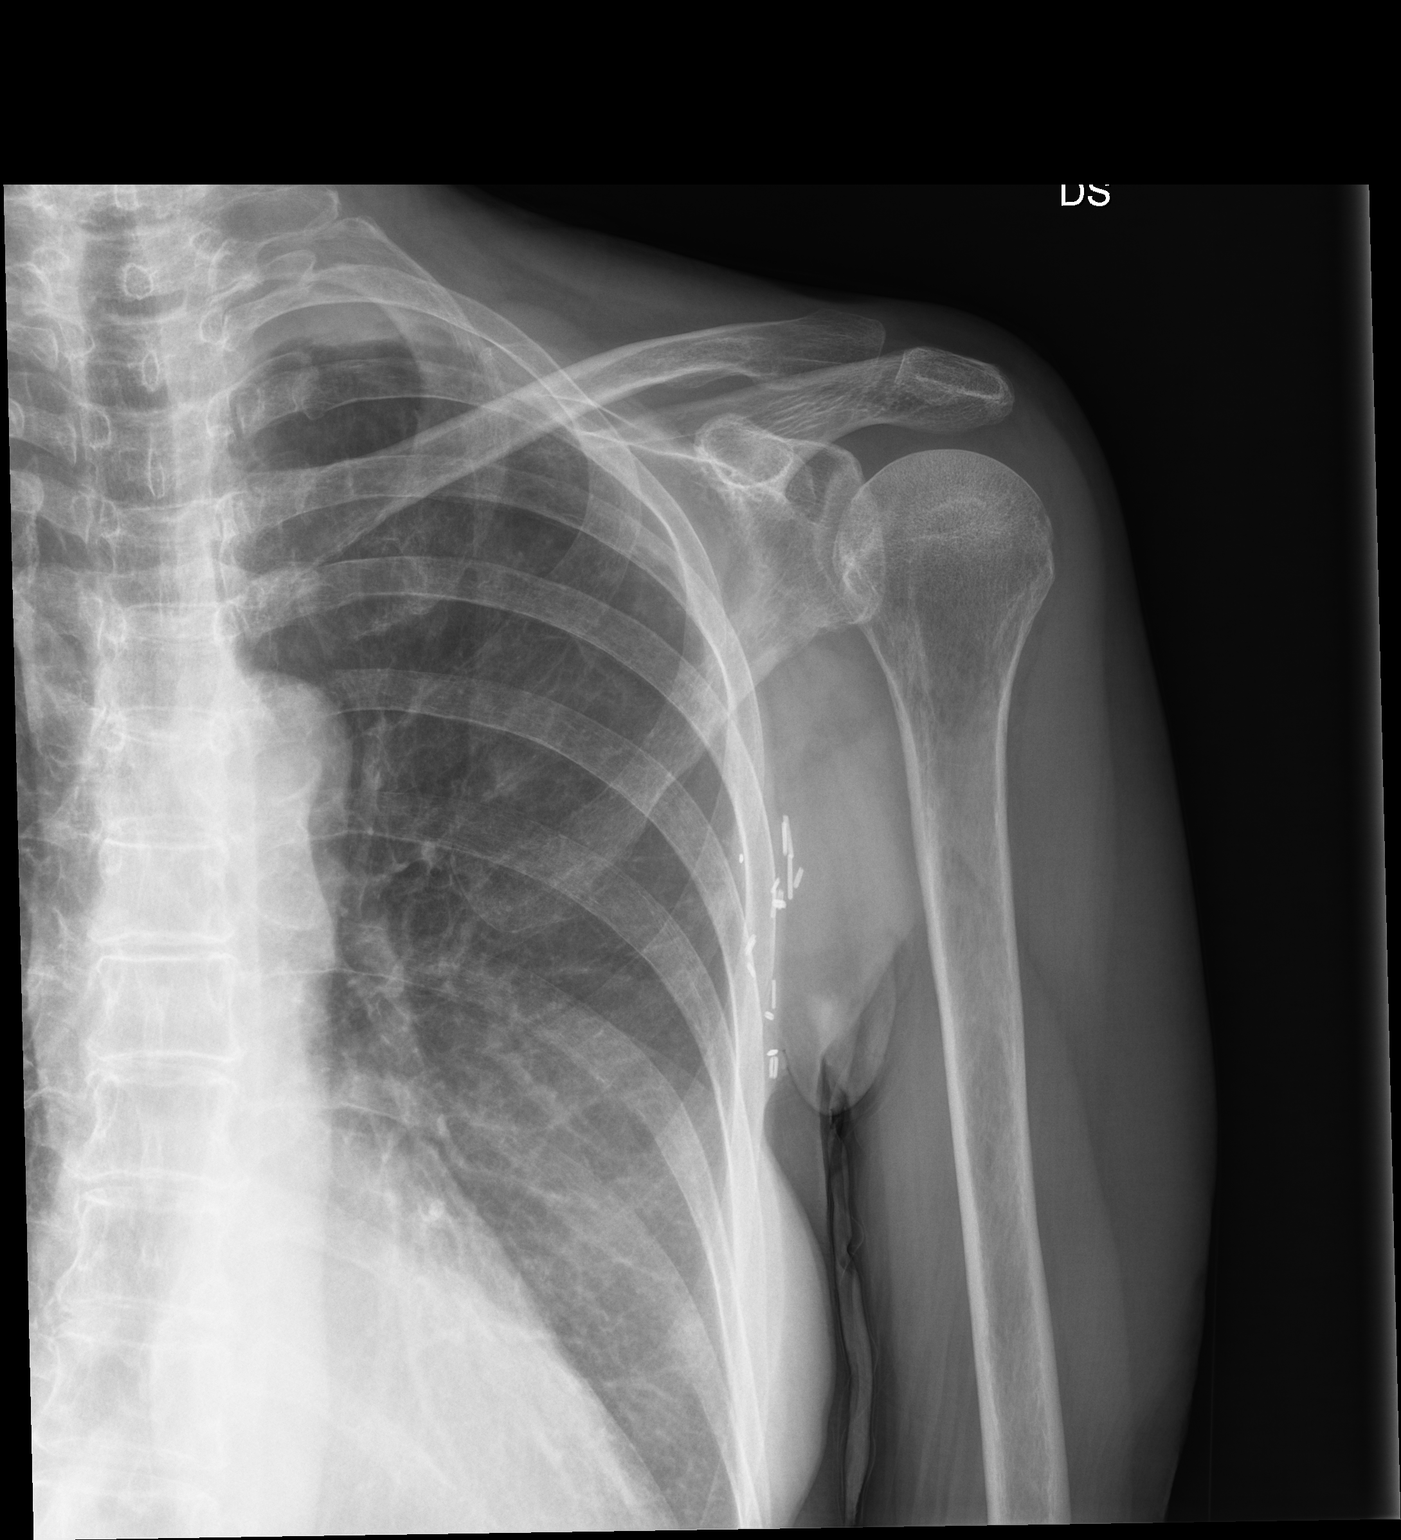

[grashey]
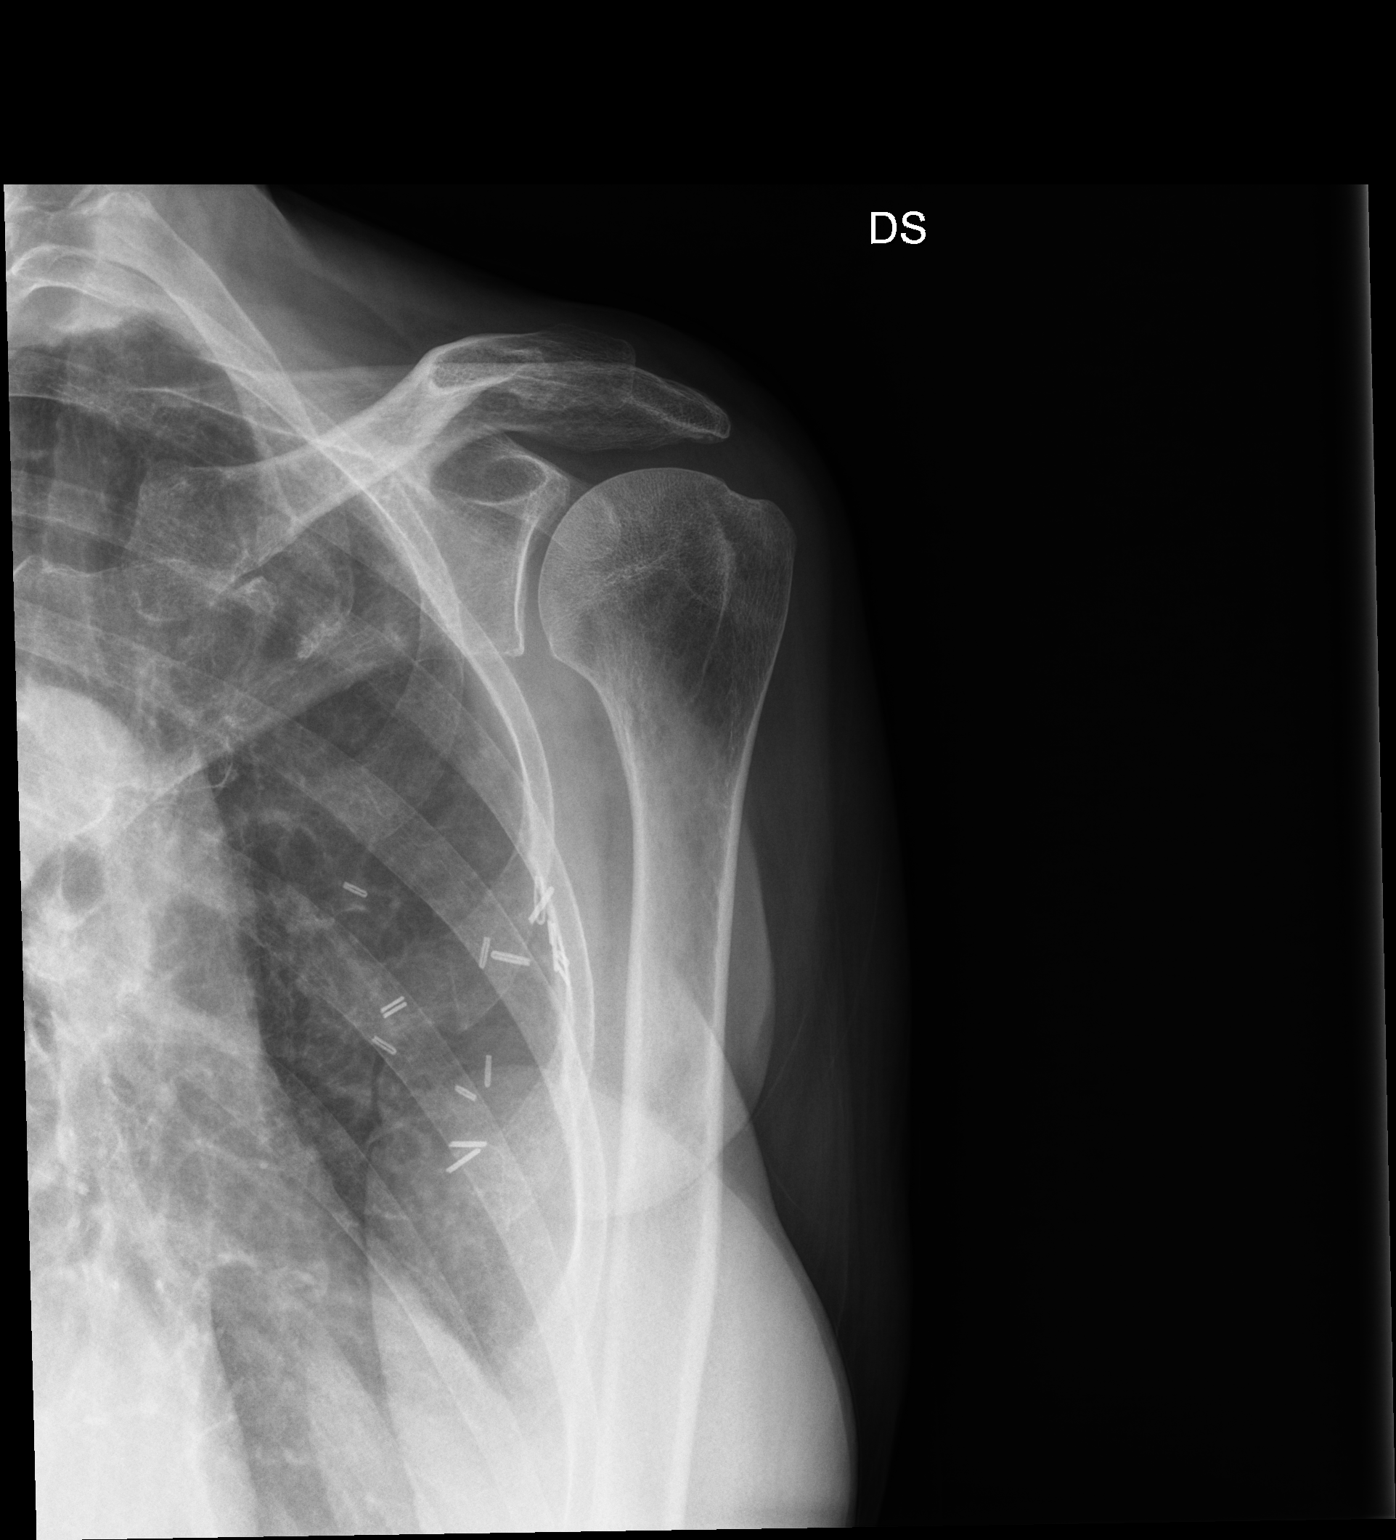

[2 of 2 positions shown; findings below may reference images not displayed]

FINDINGS: No fractures or dislocations. Mild degenerative change of the right 
glenohumeral/AC joints. Left shoulder joint spaces are preserved. Osteopenia. 
Mild biapical pleural thickening. Left axillary clips. Bilateral chest wall 
implants. Azygos lobe, normal variant.
IMPRESSION: 1.  Mild degenerative change of the right shoulder and osteopenia. 
2.  Normal left shoulder.

## 2023-01-27 IMAGING — DX HIP BILATERAL WITH PELVIS 5 VIEWS
5 series · 5 of 5 positions shown · non-contrast
Comparison: 02/03/2022

________________________________________________________________________________________________ 
HIP BILATERAL WITH PELVIS 5 VIEWS, 01/27/2023 [DATE]: 
CLINICAL INDICATION: Pain.

[AP (1 of 3)]
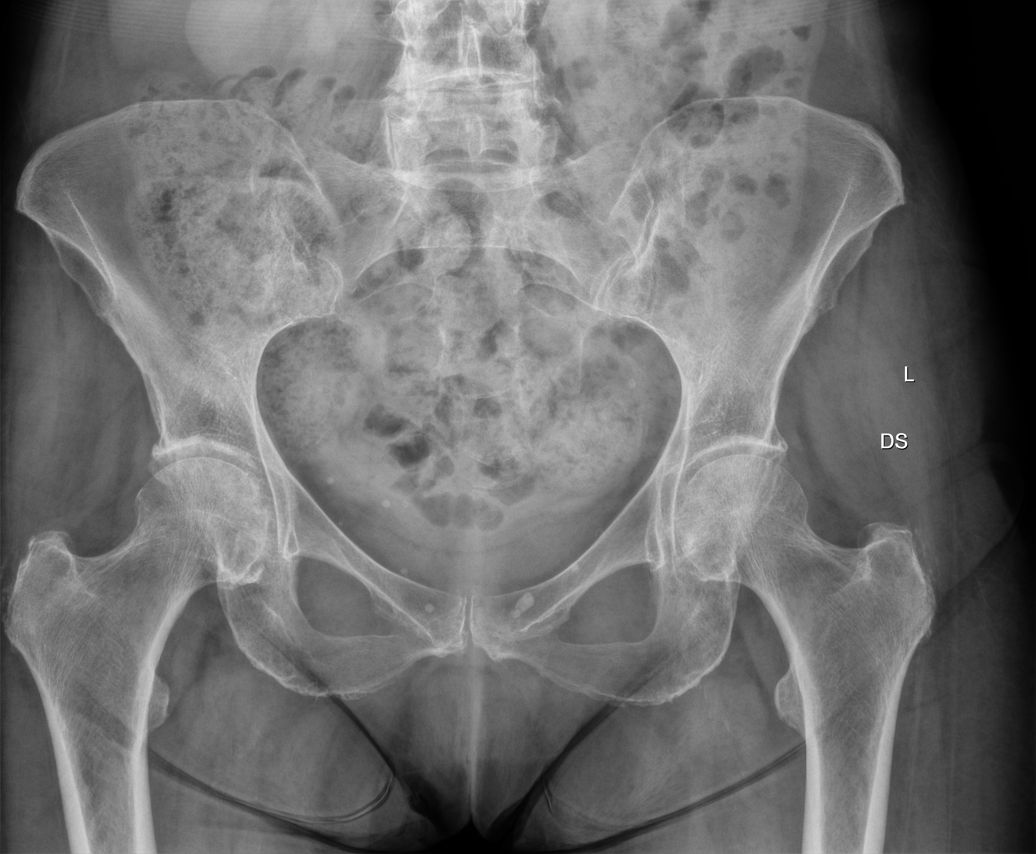

[AP (2 of 3)]
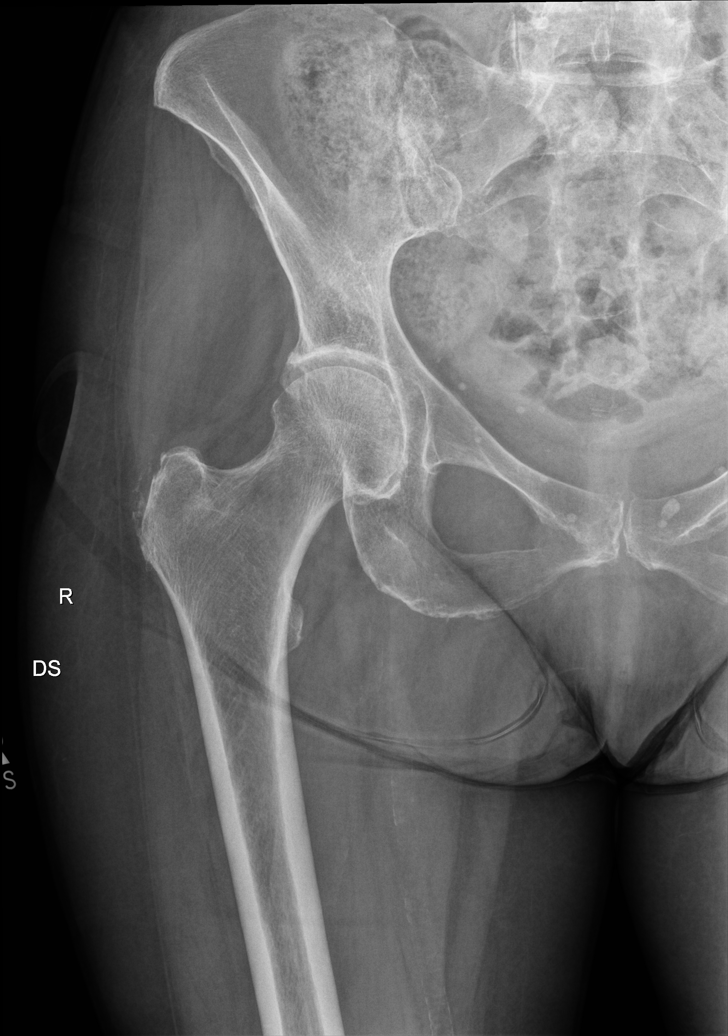

[frog (1 of 2)]
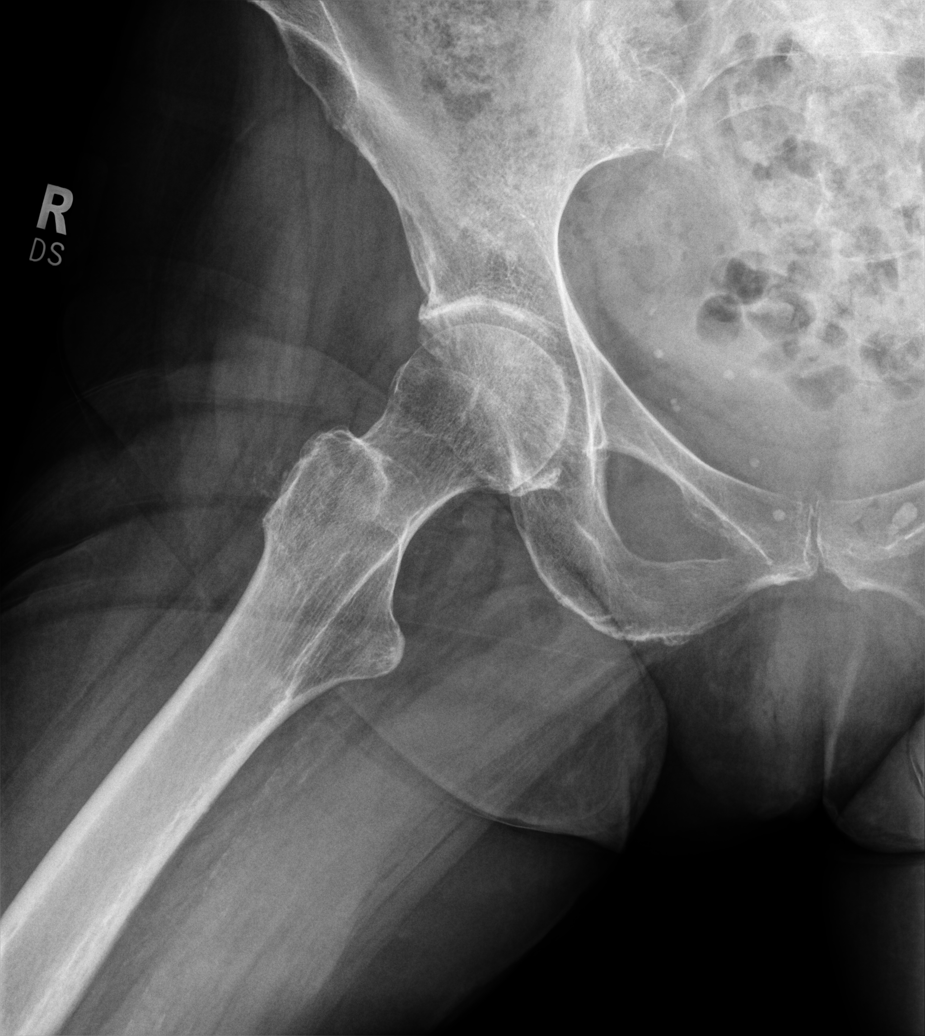

[AP (3 of 3)]
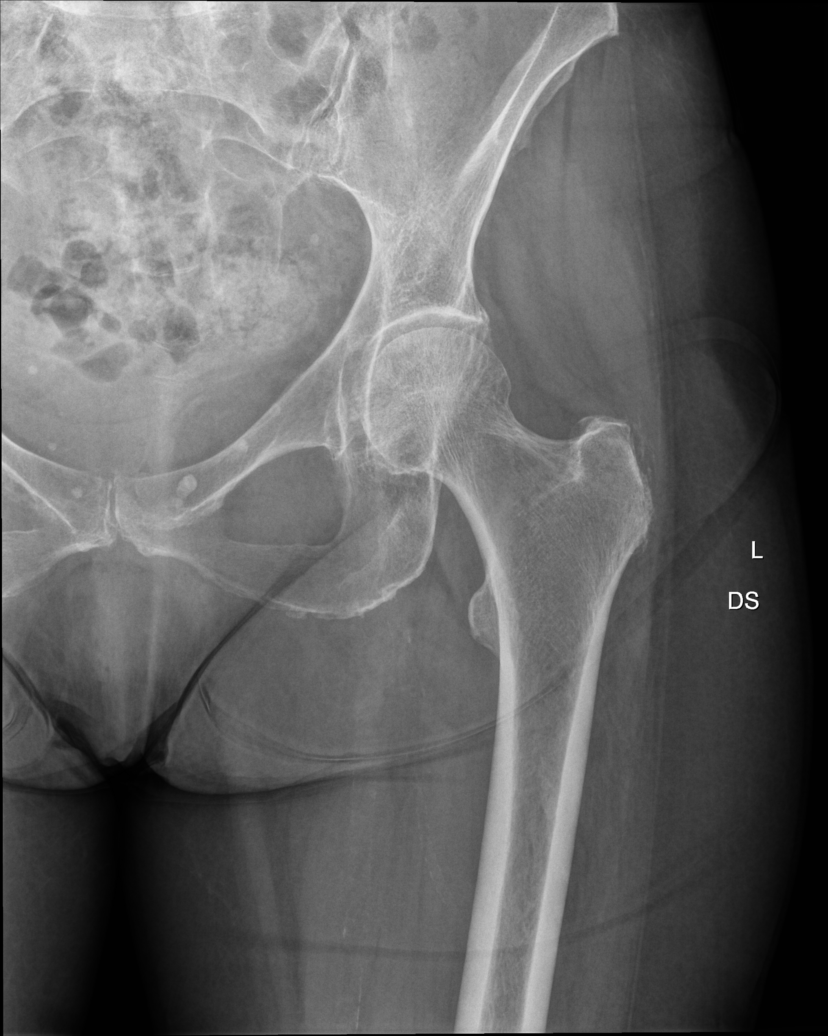

[frog (2 of 2)]
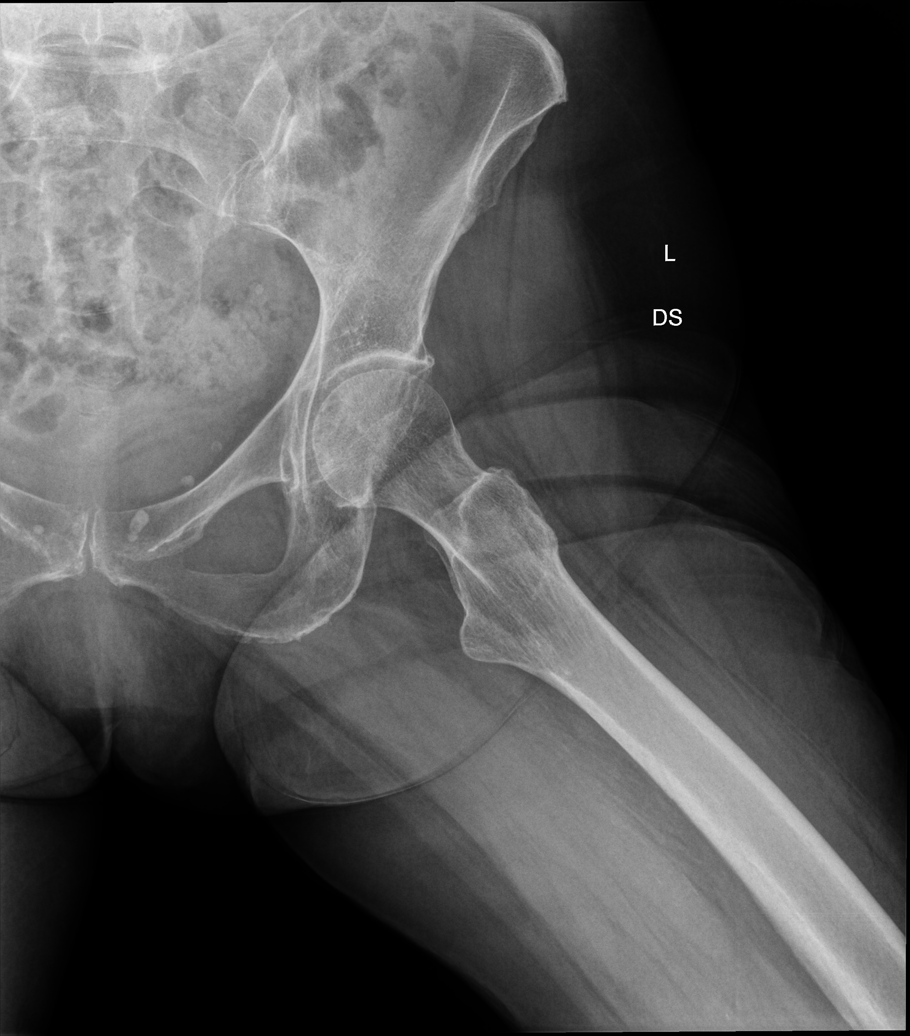

[5 of 5 positions shown; findings below may reference images not displayed]

FINDINGS: No fractures or dislocations. Mild degenerative change of the 
hips/pubic symphysis and multilevel degenerative change of the spine. 
Osteopenia. No soft tissue swelling.
IMPRESSION: Mild degenerative change and osteopenia.

## 2023-01-27 IMAGING — DX SHOULDER RIGHT 2 VIEWS
2 series · 2 of 2 positions shown · non-contrast
Comparison: None.

________________________________________________________________________________________________ 
SHOULDER RIGHT 2 VIEWS, SHOULDER LEFT 2 VIEWS, 01/27/2023 [DATE]: 
CLINICAL INDICATION: Shoulder pain.

[ap int rot]
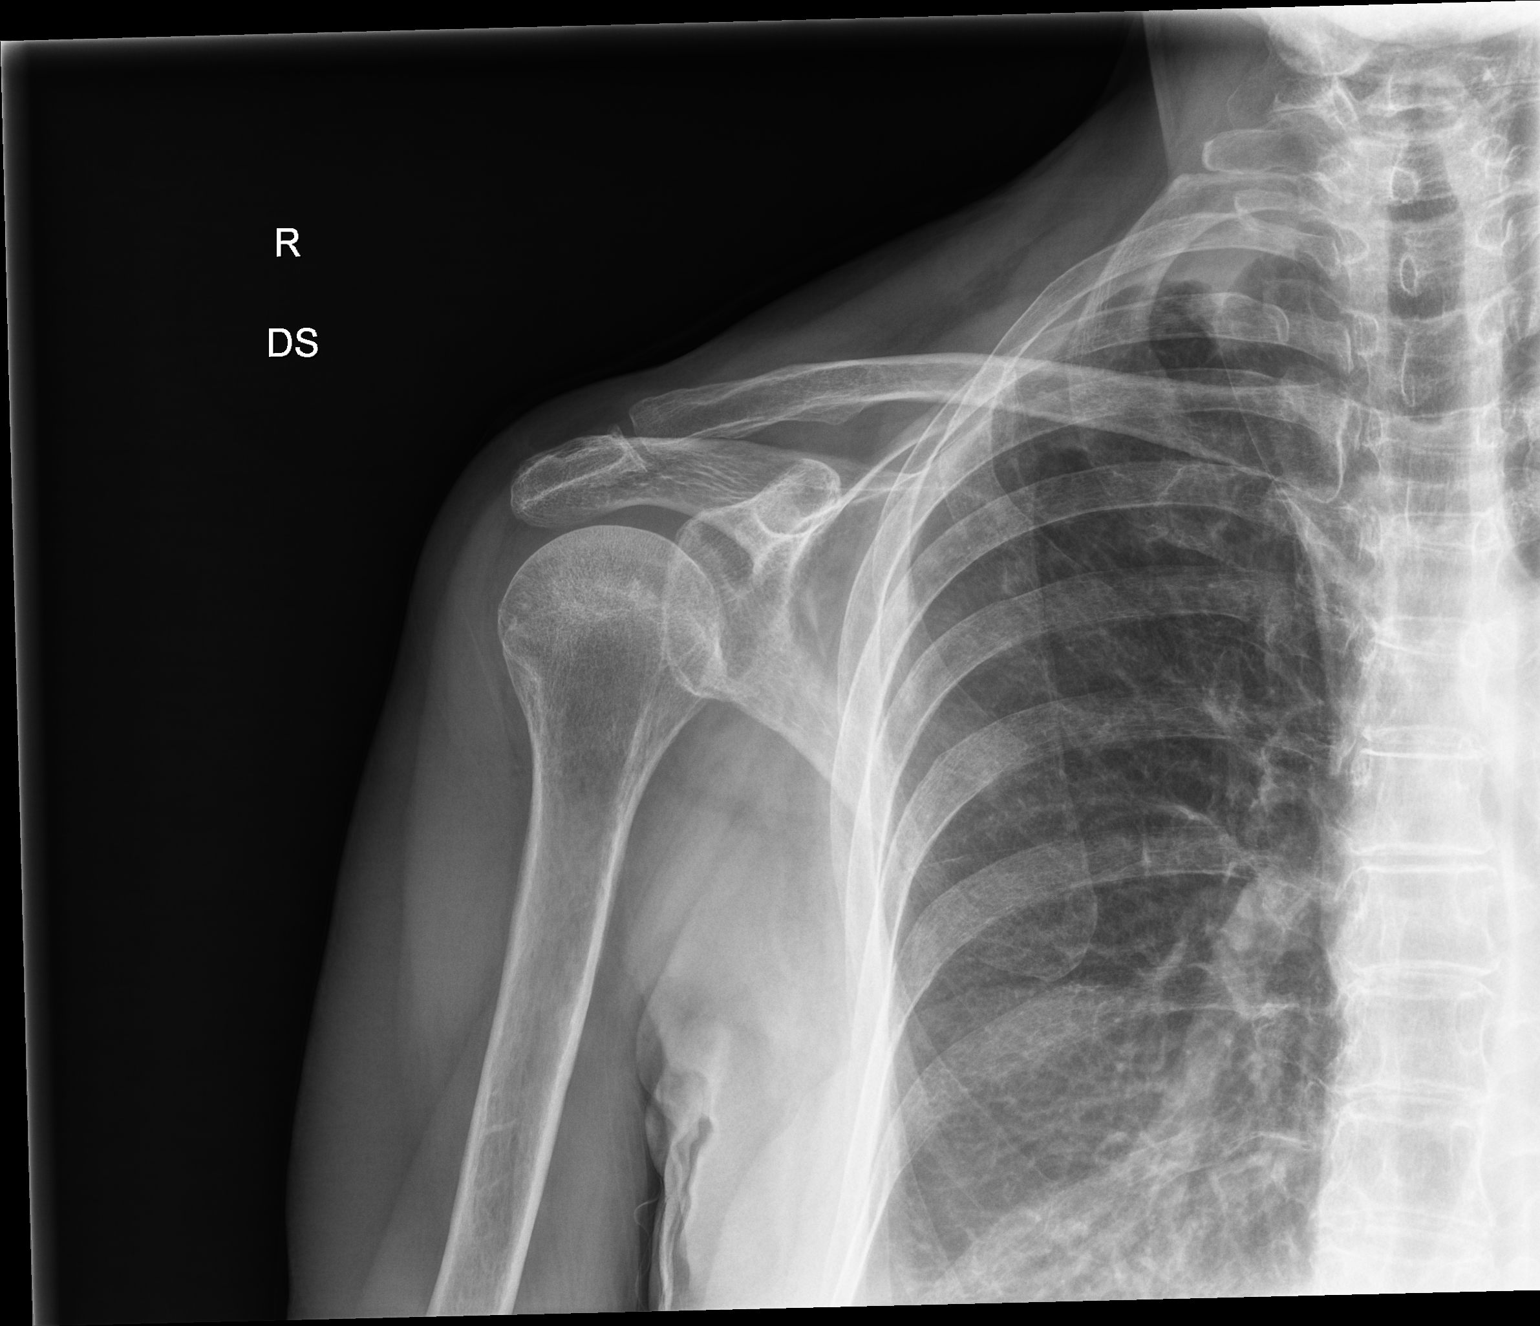

[grashey]
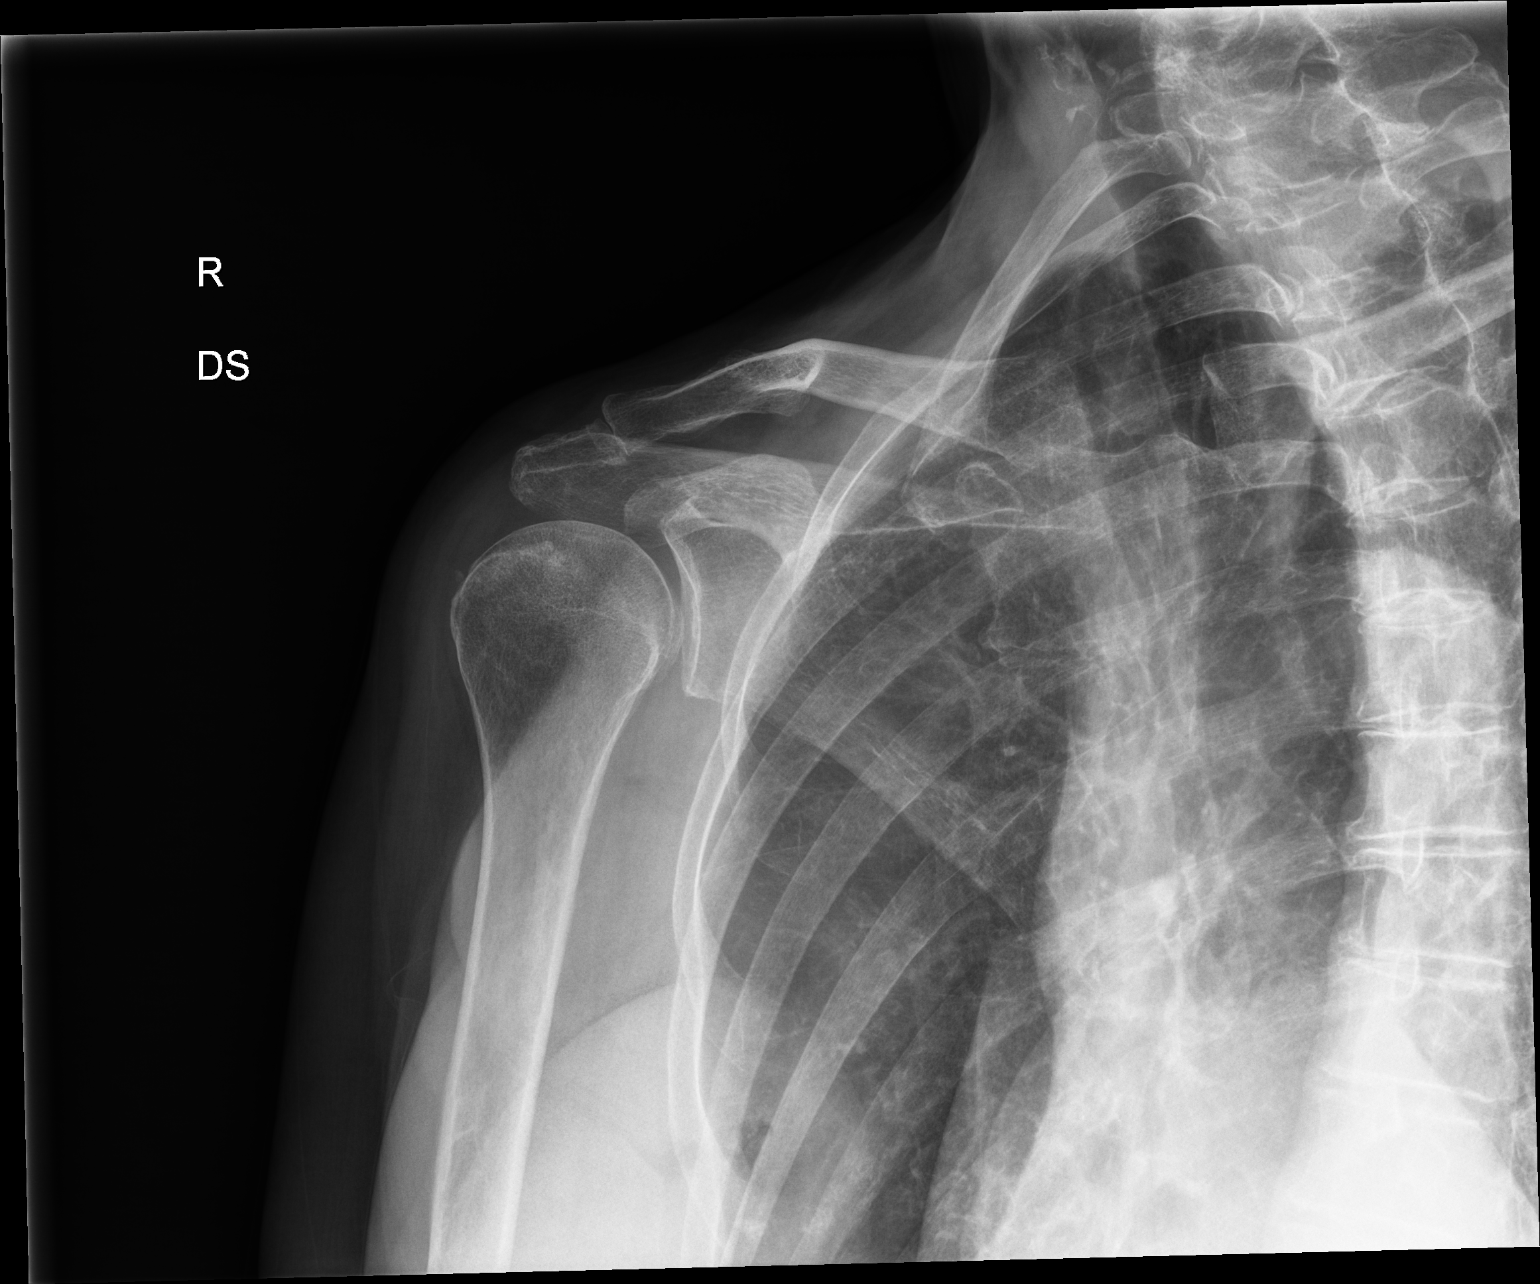

[2 of 2 positions shown; findings below may reference images not displayed]

FINDINGS: No fractures or dislocations. Mild degenerative change of the right 
glenohumeral/AC joints. Left shoulder joint spaces are preserved. Osteopenia. 
Mild biapical pleural thickening. Left axillary clips. Bilateral chest wall 
implants. Azygos lobe, normal variant.
IMPRESSION: 1.  Mild degenerative change of the right shoulder and osteopenia. 
2.  Normal left shoulder.

## 2023-01-27 IMAGING — DX LUMBAR SPINE AP, LAT WITH FLEXION AND EXTEN
4 series · 4 of 4 positions shown · non-contrast
Comparison: None

________________________________________________________________________________________________ 
LUMBAR SPINE AP, LAT WITH FLEXION AND EXTEN, 01/27/2023 [DATE]: 
CLINICAL INDICATION: Back pain. Radiculopathy, Lumbar Region

[AP]
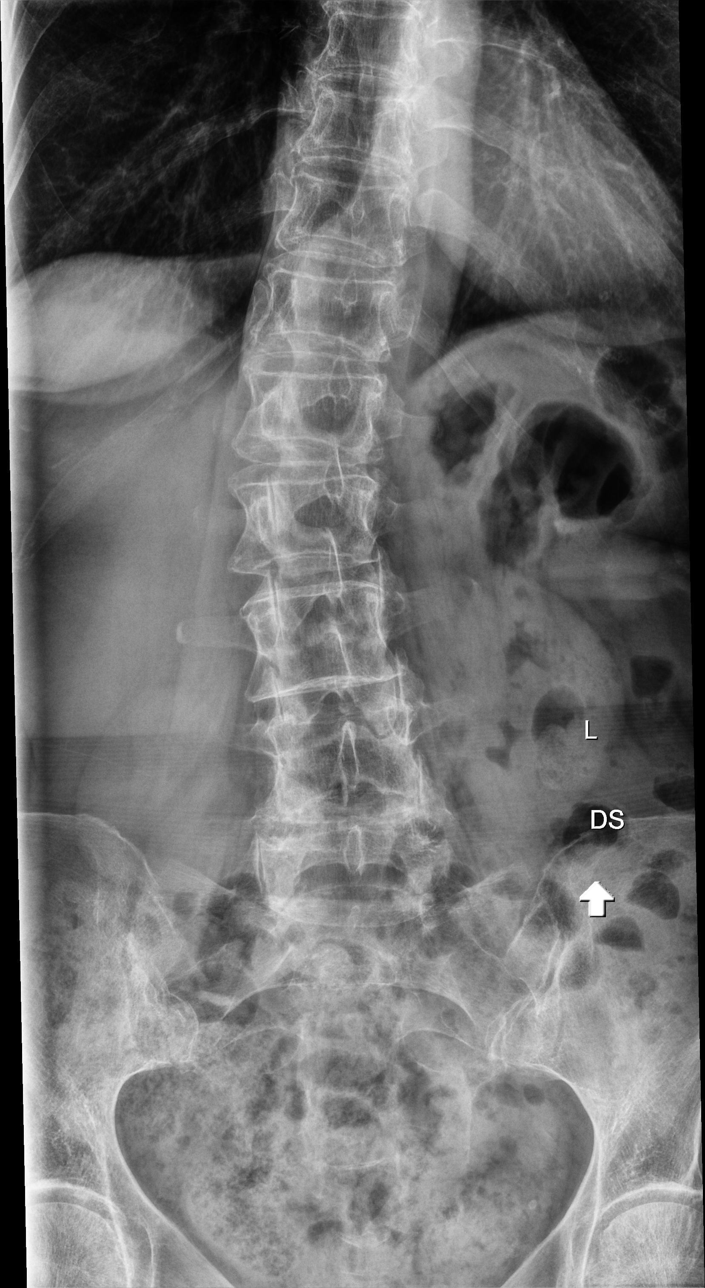

[lateral]
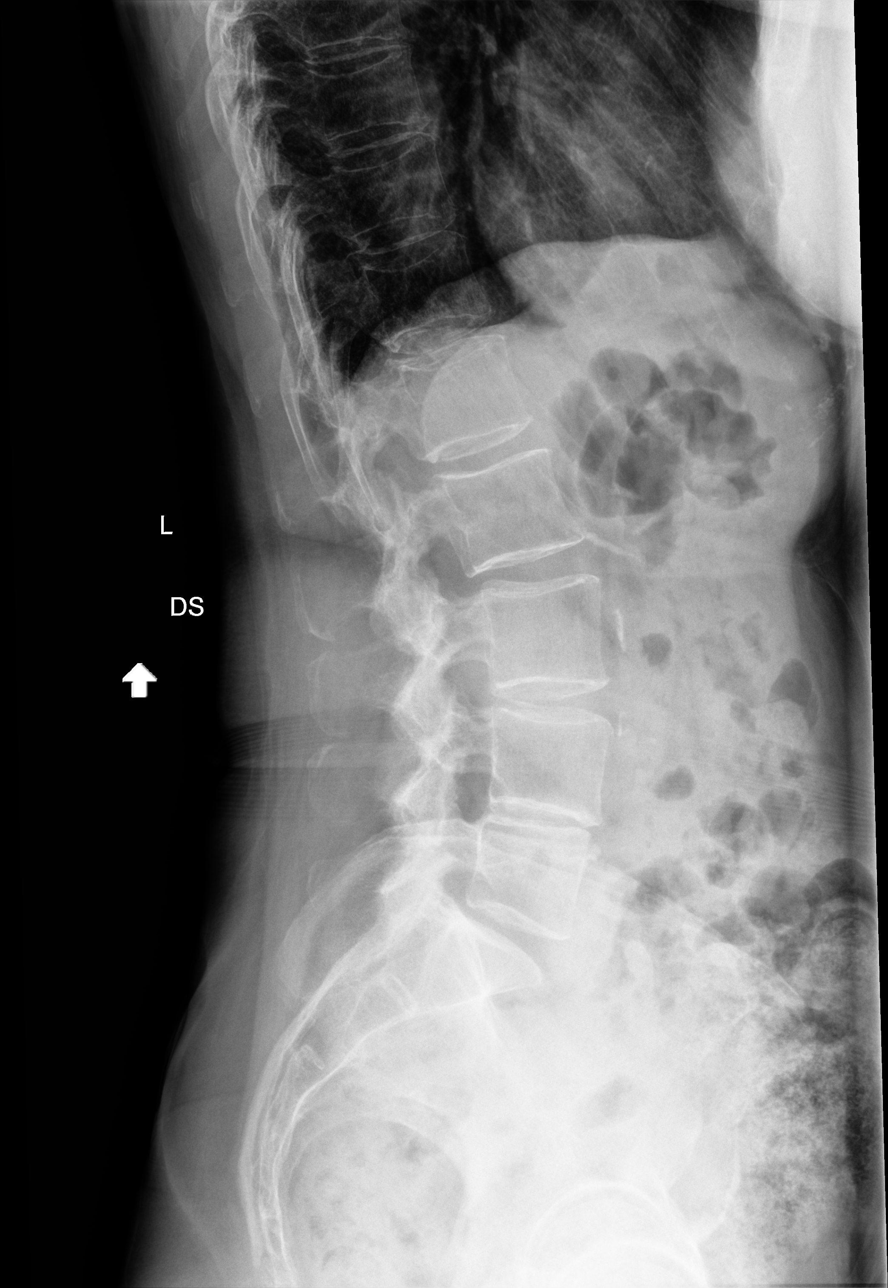

[lateral flex]
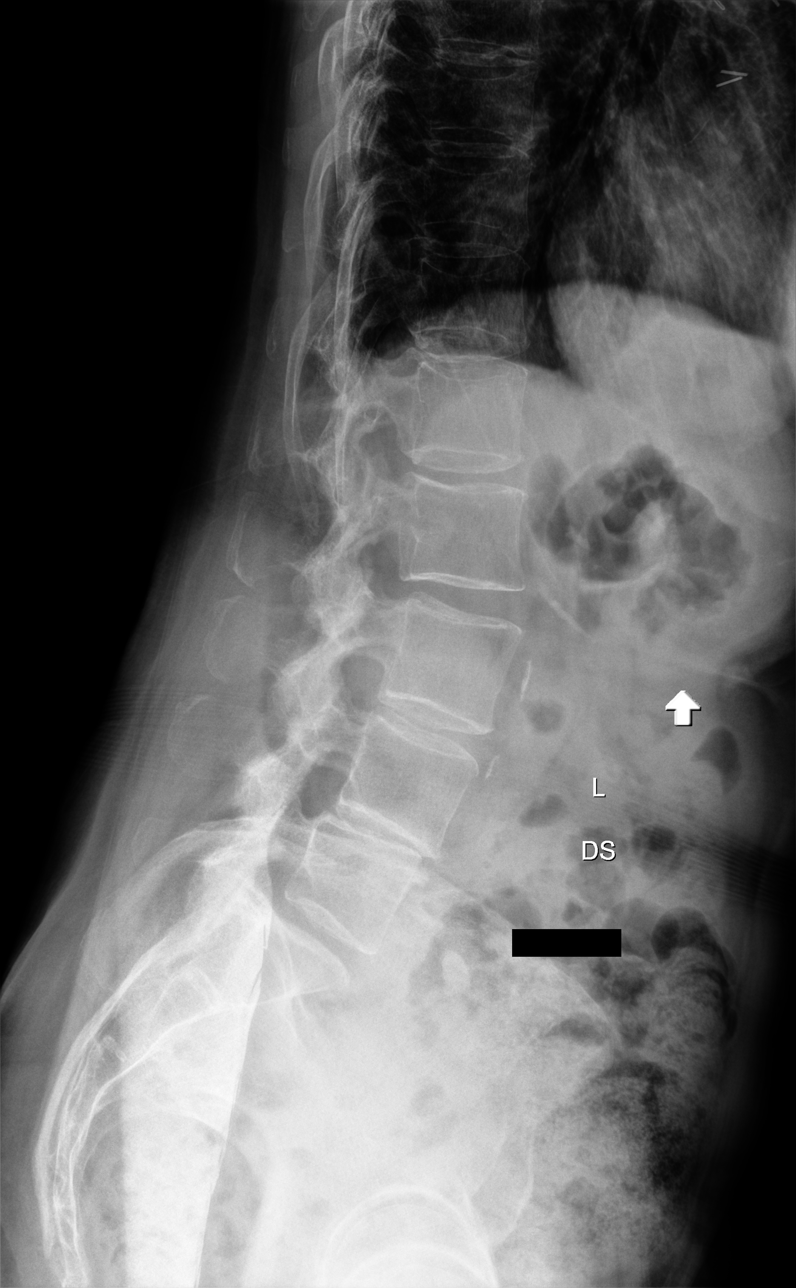

[lateral ext]
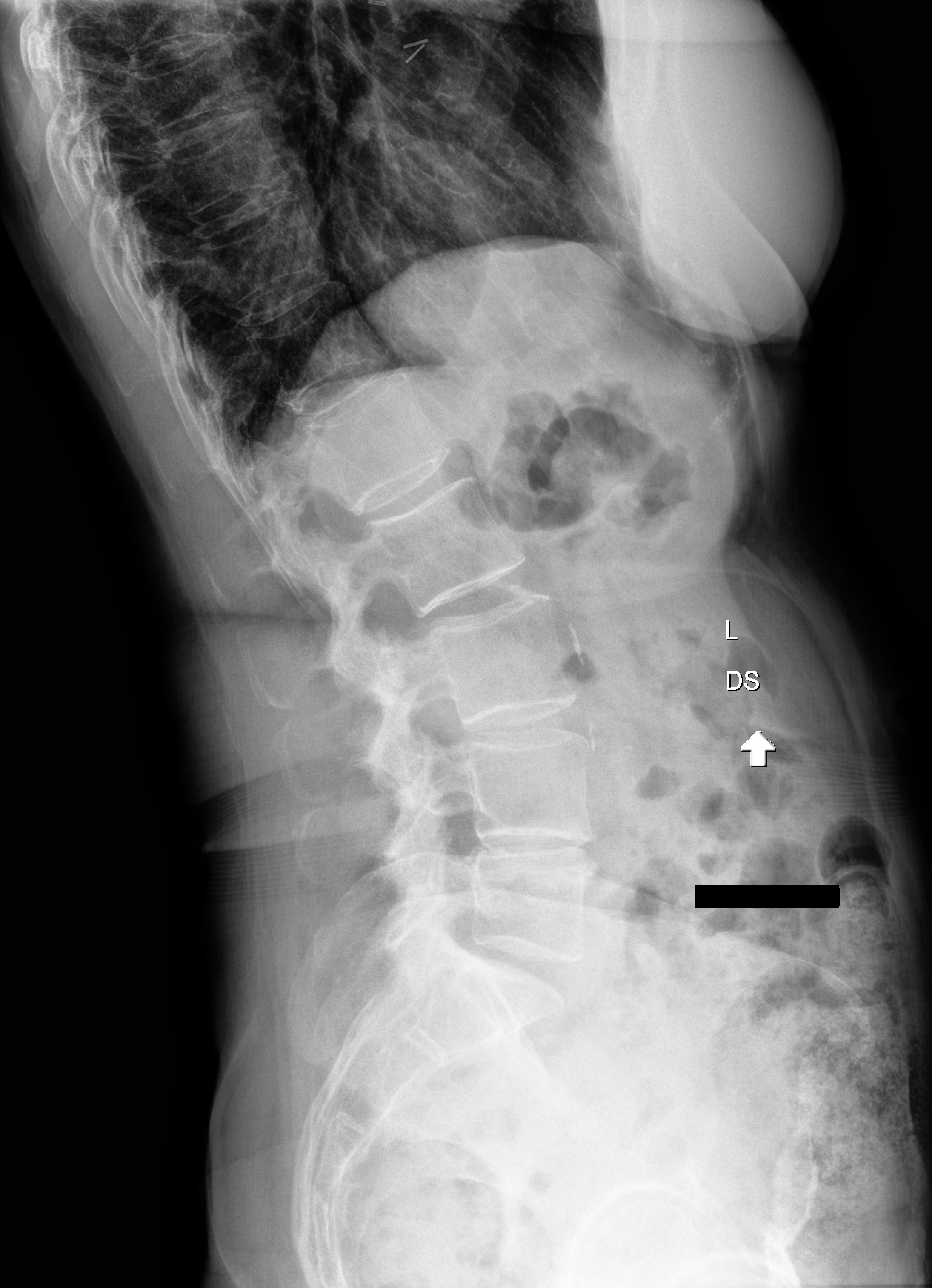

[4 of 4 positions shown; findings below may reference images not displayed]

FINDINGS: No fracture, subluxation or dynamic instability. Multifocal disc space 
narrowing (most marked at L4-5) and small marginal osteophytes. Lower 
lumbar/lumbosacral facet arthropathy. 11 degrees dextrocurvature. Degenerative 
change of the SI joints. Osteopenia. Atherosclerosis.
IMPRESSION: 1.  Degenerative change, mild dextroscoliosis and osteopenia.  
2.  No dynamic instability.

## 2023-01-27 MED ORDER — ALBUTEROL SULFATE HFA 108 (90 BASE) MCG/ACT IN AERS: INHALATION_SPRAY | RESPIRATORY_TRACT | 3 refills | Status: DC

## 2023-01-27 MED ORDER — FOLIC ACID 1 MG OR TABS: 3.0000 mg | ORAL_TABLET | Freq: Every day | ORAL | 3 refills | Status: DC

## 2023-01-27 MED ORDER — TRIAMTERENE-HCTZ 37.5-25 MG OR TABS: 1.0000 | ORAL_TABLET | Freq: Every day | ORAL | 3 refills | Status: DC

## 2023-01-27 MED ORDER — FAMOTIDINE 20 MG OR TABS: 20.0000 mg | ORAL_TABLET | Freq: Two times a day (BID) | ORAL | 3 refills | Status: DC

## 2023-01-27 MED ORDER — MONTELUKAST SODIUM 10 MG OR TABS: 10.0000 mg | ORAL_TABLET | Freq: Every day | ORAL | 3 refills | Status: DC

## 2023-01-27 MED ORDER — CLOBETASOL PROPIONATE 0.05 % EX OINT: TOPICAL_OINTMENT | CUTANEOUS | 3 refills | Status: DC

## 2023-01-27 MED ORDER — APIXABAN 5 MG OR TABS: 5.0000 mg | ORAL_TABLET | Freq: Two times a day (BID) | ORAL | 3 refills | Status: DC

## 2023-01-27 NOTE — Patient Instructions (Signed)
 It is very important to maintain a healthy lifestyle.  This begins with eating a balanced diet that includes daily servings of fruit, vegetables and whole grains.  It is much healthier to prepare your own meals.  Try to avoid fast food and sugared drinks.  Maintaining an active lifestyle is also important.  This includes 3-4 days of an exercise that increases your heart rate (cardio) for at least 20-30 minutes.  Examples of cardiovascular exercise are walking, jogging, swimming, biking.  Stretching exercises such as yoga and working with weights can also have great benefit.     Specific health issues that are important for you to manage are:    Rheumatoid arthritis   Asthma

## 2023-01-27 NOTE — Progress Notes (Signed)
ANNUAL WELLNESS VISIT        Caitlin Wiley is a 73 year old female who presents for a subsequent Annual Wellness Visit.    Is this a face-to-face video or an audio-only visit?  No       INFORMATION GATHERING:      The following areas were confirmed with patient/caregiver and/or updated in Epic at this visit:   Past Medical, Surgical, Family, and Social History  Current medications and supplements  Allergies  All of the above components have been reviewed and updated: yes    Opioid Use:   Patient not taking opioids       I have not reviewed the patient's risk factors for other substance use disorders.  If appropriate, I've referred the patient for treatment.      Please see today's HRA for review of patient's functional ability and level of safety. Concerns are addressed below in the Personalized Prevention Plan section.    For list of current providers and suppliers, see Care Team Section, EHR encounters, and/or HRA questionnaire for [facility] providers involved in care    Other providers and suppliers outside [facility]:   No outside providers identified      Depression screening:    PHQ-9:  3     EXAM:     BP 112/70   Temp 37 C (Temporal)   Ht 5' 4.76" (1.645 m)   Wt (!) 115.2 kg (254 lb)   LMP  (LMP Unknown)   SpO2 95%   BMI 42.58 kg/m         GAIT:   Abnormal as assessed by direct observation (describe): uses walker    COGNITION:       Intact          ADVANCE CARE PLANNING (ACP)       Advance care planning:  declined    No advance directive discussion during this visit    ASSESSMENT:       Caitlin Wiley was seen for her Annual Wellness Visit, including identification of risk factors & conditions that may affect her health and function in the future.         (Z00.00) Encounter for Medicare annual wellness exam  (primary encounter diagnosis)  Plan:       Actions at this visit:      Established or updated a written schedule of screening and prevention measures recommended and  appropriate for Caitlin Wiley for the next 5-10 years  Established or updated a list of her risk factors and conditions for which lifestyle or medical interventions are recommended or underway, including mental health risks and conditions, and including risks/benefits of treatment  Furnished personalized health advice and, as appropriate, referrals to health education or preventive counseling services or programs (such as fall prevention, tobacco cessation, physical activity, nutrition, cognition, weight loss)    All of the above components have been reviewed and updated. yes    Personalized Prevention Plan:      It is very important to maintain a healthy lifestyle.  This begins with eating a balanced diet that includes daily servings of fruit, vegetables and whole grains.  It is much healthier to prepare your own meals.  Try to avoid fast food and sugared drinks.  Maintaining an active lifestyle is also important.  This includes 3-4 days of an exercise that increases your heart rate (cardio) for at least 20-30 minutes.  Examples of cardiovascular exercise are walking, jogging, swimming, biking.  Stretching exercises such as yoga and working with weights can also have great benefit.     Specific health issues that are important for you to manage are:    Rheumatoid arthritis   Asthma    Here are screening & prevention measures recommended for you:  Health Maintenance   Topic Date Due    Zoster Vaccine (2 of 2) 12/12/2022    Depression Screening (PHQ-2)  02/03/2023    Medicare Annual Wellness Visit  02/03/2023    Breast Cancer Screening  10/06/2024    Lipid Disorders Screening  11/21/2025    Colorectal Cancer Screening  05/16/2026    DTaP, Tdap and Td Vaccines (4 - Td or Tdap) 12/25/2031    Pneumococcal Vaccine: 50+ Years  Completed    Influenza Vaccine  Completed    Hepatitis C Screening  Completed    COVID-19 Vaccine  Completed    Hepatitis A Vaccine  Aged Out    Meningococcal B Vaccine  Aged Out    HPV  Vaccine  Aged Out    Osteoporosis Screening  Discontinued    Hepatitis B Vaccine  Discontinued       Please plan to have a Subsequent Annual Wellness Visit in 1 year.    ================================================================    HPI  Caitlin Wiley is a 73 year old female who presents for Medicare Wellness, but also needs refills of all her medications.  She reports that her asthma is doing well.  Rarely has to use her albuterol.  Her GERD symptoms are controlled.  Leg edema not worse.  She continues to see rheumatology for rheumatoid arthritis and cardiology for Afib.        Review of patient's allergies indicates:  Allergies   Allergen Reactions    Bee Venom Anaphylaxis    Ciprofloxacin Other     Joint aching    Pneumococcal Vaccines Skin: Hives    Sulfa Antibiotics Skin: Hives    Cats [Animals] Skin: Itching    Dust Mite Extract Skin: Itching     DUST    Pollen Extract Skin: Itching         Social History     Tobacco Use    Smoking status: Never    Smokeless tobacco: Never   Substance Use Topics    Alcohol use: No     Alcohol/week: 0.0 standard drinks of alcohol     Comment: rare              Physical Exam  BP 112/70   Temp 37 C (Temporal)   Ht 5' 4.76" (1.645 m)   Wt (!) 115.2 kg (254 lb)   LMP  (LMP Unknown)   SpO2 95%   BMI 42.58 kg/m   General:  alert, cooperative, no acute distress  Lung:  clear to auscultation bilaterally  Heart:  regular rate and rythmn, no murmurs  Extremity exam:  warm, pink, 3+ edema      Assesment/Plan  (J45.20) Mild intermittent asthma without complication (HCC)  Plan: montelukast 10 MG tablet, albuterol HFA 108 (90        Base) MCG/ACT inhaler            (K21.9) Gastroesophageal reflux disease without esophagitis  Plan: famotidine 20 MG tablet            (I48.0) Paroxysmal atrial fibrillation (HCC)  Plan: apixaban (Eliquis) 5 MG tablet, Lipid Panel            (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

## 2023-01-27 NOTE — Progress Notes (Signed)
Visit: pt is here for a wellness. Pt states she has procedure 12/01/22 on her belly and suture poking out.     Refills? YES  Referral? NO  Letter or Form? NO  Lab Results? NO    HEALTH MAINTENANCE:  Has the patient has this done since their last visit?  Cervical screening/PAP: N/A  Mammo: N/A  Colon Screen: N/A  Diabetic Eye Exam (If applicable): N/A    Have you seen a specialist since your last visit: No    Vaccines Due? No    Does patient have eCare?  YES     HM Due:   Health Maintenance   Topic Date Due    Zoster Vaccine (2 of 2) 12/12/2022    Depression Screening (PHQ-2)  02/03/2023    Medicare Annual Wellness Visit  02/03/2023    Breast Cancer Screening  10/06/2024    Lipid Disorders Screening  11/21/2025    Colorectal Cancer Screening  05/16/2026    DTaP, Tdap and Td Vaccines (4 - Td or Tdap) 12/25/2031    Pneumococcal Vaccine: 50+ Years  Completed    Influenza Vaccine  Completed    Hepatitis C Screening  Completed    COVID-19 Vaccine  Completed    Hepatitis A Vaccine  Aged Out    Meningococcal B Vaccine  Aged Out    HPV Vaccine  Aged Out    Osteoporosis Screening  Discontinued    Hepatitis B Vaccine  Discontinued       PCP Verified?  Yes, El-Attar, Desiree Lucy, MD

## 2023-02-01 NOTE — Addendum Note (Signed)
 Addended by: Nyoka Lint on: 02/01/2023 12:55 PM     Modules accepted: Level of Service

## 2023-04-01 ENCOUNTER — Other Ambulatory Visit (HOSPITAL_BASED_OUTPATIENT_CLINIC_OR_DEPARTMENT_OTHER): Payer: Self-pay | Admitting: Rheumatology

## 2023-04-01 DIAGNOSIS — M0579 Rheumatoid arthritis with rheumatoid factor of multiple sites without organ or systems involvement: Secondary | ICD-10-CM

## 2023-04-04 MED ORDER — METHOTREXATE SODIUM 2.5 MG OR TABS
ORAL_TABLET | ORAL | 0 refills | Status: AC
Start: 2023-04-04 — End: ?

## 2023-04-21 ENCOUNTER — Emergency Department: Payer: Self-pay

## 2023-04-21 ENCOUNTER — Ambulatory Visit (INDEPENDENT_AMBULATORY_CARE_PROVIDER_SITE_OTHER): Admitting: Family

## 2023-04-21 VITALS — BP 102/64 | HR 101 | Temp 97.9°F | Resp 18

## 2023-04-21 DIAGNOSIS — R109 Unspecified abdominal pain: Secondary | ICD-10-CM

## 2023-04-21 DIAGNOSIS — R630 Anorexia: Secondary | ICD-10-CM

## 2023-04-21 NOTE — Progress Notes (Signed)
 Use of Ambient Listening:   Was ambient listening technology used during this visit and was verbal consent for recording obtained? Yes, used in visit. Consent was obtained.       Subjective :    Caitlin Wiley is a 73 year old female who presents on 04/21/2023.   History of Present Illness  The patient, with a history of rheumatoid arthritis managed with methotrexate, presents with a two-week history of anorexia, bloating, belching, and diarrhea. She describes the diarrhea as non-bloody and non-tarry, with a single episode of green stool after consuming Gatorade. The frequency of diarrhea is not specified, but the patient notes that she has not had any episodes today or yesterday. She denies recent travel, antibiotic use, and medication changes.    The patient describes a sensation of pressure in the epigastric region, radiating to the left upper quadrant. She denies chest pain, palpitations, and dizziness. She also reports a transient left arm pain, which she attributes to lifting a heavy object. She has been managing her symptoms with simethicone and Gaviscon. The abdominal discomfort is absent when she does not eat, but she experiences bloating and belching after meals. She denies dysuria, hematuria, and changes in urinary frequency. Her last colonoscopy was three years ago.          Objective : .    Vitals: BP 102/64   Pulse (!) 101   Temp 36.6 C (Temporal)   Resp 18   LMP  (LMP Unknown)   SpO2 96%   Physical Exam  General: non toxic appearing in no distress.   CARDIOVASCULAR: Heart regular rate and rhythm. Right radial pulse 2+.  ABDOMEN: Abdomen tender, notably in LLQ and RLQ. Non disteneed. Bowel sounds present.               Assessment & Plan .     Assessment & Plan  Abdominal Pain  Abdominal tenderness with elevated heart rate and nausea concerning for appendicitis, diverticulitis, gastroparesis, or peptic ulcer disease. Less likely cardiac or neoplasm. Immediate imaging and lab work required to rule  out serious conditions.  - Refer to emergency department for evaluation and imaging.  - Advise NPO status until evaluation.  - Discussed potential surgical intervention if appendicitis confirmed, antibiotics for simple cases.

## 2023-04-27 ENCOUNTER — Telehealth (INDEPENDENT_AMBULATORY_CARE_PROVIDER_SITE_OTHER): Admitting: Family Medicine

## 2023-04-27 DIAGNOSIS — K5909 Other constipation: Secondary | ICD-10-CM

## 2023-04-27 DIAGNOSIS — R140 Abdominal distension (gaseous): Secondary | ICD-10-CM

## 2023-04-27 NOTE — Progress Notes (Signed)
 Distant Site Telemedicine Encounter  I conducted this encounter via secure, live, face-to-face video conference with the patient. I reviewed the risks and benefits of telemedicine as pertinent to this visit and the patient agreed to proceed.    Provider Location: Off-site location (home, non-[place] location)  Patient Location: At home    Present with patient: No one else present             HPI  Caitlin Wiley is a 73 year old female who presents for follow-up ongoing GI issues.  She states on March 22nd began having some increased bloating after eating.  This increasingly became worse and painful.  Went to a [employer] and then began having problems with constipation.  Seen at [facility] ER on 04/21/23.  Had abdominal CT which showed some diverticulosis.  Also had a mildly elevated WBC.  Was treated for presumptive diverticulitis with Augmentin.  She states she is better, but now still very constipated.  Has not had a BM in 4 days.  Previously Dulcolax has worked.  But wanted to review with me if OK to use.  Has also not been eating much due to fear of severe bloating.      Review of patient's allergies indicates:  Allergies   Allergen Reactions    Bee Venom Anaphylaxis    Ciprofloxacin Other     Joint aching    Pneumococcal Vaccines Skin: Hives    Sulfa Antibiotics Skin: Hives    Cats [Animals] Skin: Itching    Dust Mite Extract Skin: Itching     DUST    Pollen Extract Skin: Itching         Social History     Tobacco Use    Smoking status: Never    Smokeless tobacco: Never   Substance Use Topics    Alcohol use: No     Alcohol/week: 0.0 standard drinks of alcohol     Comment: rare              Physical Exam  General:   [x]  Alert []  Lethargic   [x]  Well-appearing []  Ill-appearing   [x]  In no acute distress []  In acute distress       Assesment/Plan  (K59.09) Other constipation  (primary encounter diagnosis)  (R14.0) Bloating  Plan: Reviewed ER records from 04/21/23.  Advised treating constipation with Dulcolax.  If that  doesn't work, try Miralax.  Also advised starting to eat more.  Advised starting with a low FODMAP diet.  Once symptoms have resolved, can reintroduce high FODMAP foods.  Follow-up in a week if not improving.      I spent a total time of 28 minutes addressing this patients care needs today.

## 2023-04-27 NOTE — Patient Instructions (Signed)
 I would recommend trying a Low FODMAP diet.  Once your symptoms improve.  You can try reintroducing the higher FODMAP foods.    What are some high-FODMAP foods?   Fruits: Apples, pears, mangoes, watermelon, and some dried fruits. ?  Vegetables: Onions, garlic, broccoli, cauliflower, and Brussels sprouts. ?  Dairy: Milk, yogurt, and some cheeses (unless lactose-free). ?  Grains: Wheat-based products, rye, and some processed foods. ?  Sweeteners: Honey and high-fructose corn syrup. ?  ???  What are some low-FODMAP foods?   Fruits: Bananas, blueberries, strawberries, kiwis, oranges, and pineapples. ?  Vegetables: Carrots, cucumbers, lettuce, potatoes, and zucchini. ?  Dairy Alternatives: Almond milk, rice milk, and lactose-free dairy products. ?  Grains: Rice, quinoa, oats, and corn. ?  Proteins: Meat, fish, eggs, and tofu.

## 2023-05-03 ENCOUNTER — Encounter (INDEPENDENT_AMBULATORY_CARE_PROVIDER_SITE_OTHER): Payer: Self-pay | Admitting: Family Medicine

## 2023-05-03 NOTE — Telephone Encounter (Signed)
 Please see update on how pt is feeling and do you need her to do any blood tests? Thank you!

## 2023-05-11 ENCOUNTER — Ambulatory Visit: Payer: Medicare HMO | Attending: Rheumatology | Admitting: Rheumatology

## 2023-05-11 VITALS — BP 105/71 | HR 88 | Wt 247.0 lb

## 2023-05-11 DIAGNOSIS — Z79899 Other long term (current) drug therapy: Secondary | ICD-10-CM | POA: Insufficient documentation

## 2023-05-11 DIAGNOSIS — M170 Bilateral primary osteoarthritis of knee: Secondary | ICD-10-CM | POA: Insufficient documentation

## 2023-05-11 DIAGNOSIS — M0579 Rheumatoid arthritis with rheumatoid factor of multiple sites without organ or systems involvement: Secondary | ICD-10-CM | POA: Insufficient documentation

## 2023-05-11 NOTE — Patient Instructions (Signed)
 It was a pleasure seeing you in clinic today.     Continue your current medications   Follow up in 4 months    If you have any questions or concerns before your next appointment, please do not hesitate to contact me. Our clinic phone number is [phone] and I am also available via eCare.

## 2023-05-11 NOTE — Progress Notes (Signed)
 [address] [place] [address]  [address]  [place], [place]  [address]  TEL: [phone]  l  FAX: [phone]    Follow Up Clinic Note  05/11/2023    IDENTIFYING DATA/CC:    Caitlin Wiley is a very pleasant 73 year old female who presents for follow up of rheumatoid arthritis, osteoarthritis.    RHEUMATOLOGIC HISTORY OF PRESENT ILLNESS:  Diagnosis: RA, OA; initial contult 11/30/2017  Presenting Symptoms/Timeline: 2013 joint pain hands, wrists, knees, shoulders  Autoimmune Serologies: RF 140, CCP 16; CRP 17, ESR 23; UA 4.6  Pertinent Radiology: Xrays of hands, wrists, knees, feet: No erosions, consistent with OA, chondrocalcinosis  Medications Used: MXT 11/2017  Significant Flares:  Immunization Status: Influenza, PCV13, PPSV23, Zoster  Infectious Disease Screening: Hepatitis B NR, Hepatitis C, TB quantiferon NR  Other: Afib on chronic anticoag,     INTERVAL HISTORY:  Patient seen today for follow-up of rheumatoid arthritis, osteoarthritis.  She was last seen 4 months ago at which time she was recovering well from her laparoscopic surgery.  Patient is on methotrexate  for treatment of her inflammatory arthritis.    Patient reports doing poorly. She was seen in ED in early April due to abdominal pain. CT scan was unremarkable but did show significant diverticular disease. She was provided with antibiotics for presumed diverticulitis. She reports improvement in abdominal pain after this.     Joints are stable. She has 5 mins of AM stiffness. No joint swelling. Pain specially in knees and wrists and left thumb. She is working on weight loss. Sister will be visiting soon. She has Alzheimers.     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 S/p ablation ([facility]ryo PVI) 10/19/2016      GERD (gastroesophageal reflux disease) [K21.9] 11/08/2012    Asthma (HCC) [J45.909] 07/24/2012    Edema [R60.9] 07/24/2012    Sleep apnea [G47.30] 07/24/2012    Pulmonary nodule [R91.1] 07/24/2012       SOCIAL/FAMILY HISTORY:  Reviewed with patient. No changes from what is currently noted in EPIC    ALLERGIES:  Review of patient's allergies indicates:  Allergies   Allergen Reactions    Bee Venom Anaphylaxis    Ciprofloxacin Other     Joint aching    Pneumococcal Vaccines Skin: Hives    Sulfa Antibiotics Skin: Hives    Cats [Animals] Skin: Itching    Dust Mite Extract Skin: Itching     DUST    Pollen Extract Skin: Itching       MEDICATIONS:  Current Outpatient Medications   Medication Sig Dispense Refill    acetaminophen 500 MG tablet Take 1 tablet (500 mg) by mouth 2 times a day.      albuterol  HFA 108 (90 Base) MCG/ACT inhaler inhale 1 to 2 puffs by mouth every 6 hours if needed for shortness of breath or wheezing 8.5 g 3    apixaban  (Eliquis ) 5 MG tablet Take 1  tablet (5 mg) by mouth 2 times a day. 180 tablet 3    Cholecalciferol (VITAMIN D3 OR) Take by mouth.      clobetasol  0.05 % ointment APPLY TOPICALLY TO AFFECTED AREAS OF VULVA three times a week 60 g 3    famotidine  20 MG tablet Take 1 tablet (20 mg) by mouth 2 times a day. 180 tablet 3    Flaxseed, Linseed, (FLAXSEED OIL) 1200 MG Oral Cap None Entered      folic acid  1 MG tablet Take 3 tablets (3 mg) by mouth daily. 270 tablet 3    GLUCOSAMINE CHONDROITIN COMPLX OR       Magnesium 400 MG Oral Cap Take 1 capsule (400 mg) by mouth.      methotrexate  2.5 MG tablet take 8 tablets by mouth every week 96 tablet 0    montelukast  10 MG tablet Take 1 tablet (10 mg) by mouth  daily. 90 tablet 3    Multiple Vitamins-Minerals (MULTIVITAMIN OR) no iron      Probiotic Product (PROBIOTIC DAILY OR)       SIMETHICONE-80 OR Take 2-3 tablets by mouth daily as needed.      triamterene -hydroCHLOROthiazide 37.5-25 MG tablet Take 1 tablet by mouth daily. 90 tablet 3     No current facility-administered medications for this visit.       PHYSICAL EXAMINATION:  Vital signs:  BP 105/71   Pulse 88   Wt (!) 112 kg (247 lb)   LMP  (LMP Unknown)   SpO2 95%   BMI 41.40 kg/m   General:  Awake, alert, and oriented, no apparent distress, pleasant, and cooperative  Psychologic:  Mood is euthymic, affect is congruent  EYES: Anicteric sclera, no conjunctival injection  ENT:  Normocephalic, atraumatic, moist membranes, no oral ulcers   Pulmonary:  Non-labored breathing, no wheezes, rhonchi, or rales  Cardiovascular:  RRR, no murmurs, rubs or gallops  Skin:  Normal temperature and texture, No visible rashes, ulcers, or nodules  Neurologic:  Light touch sensation is grossly intact. Face symmetric   Musculoskeletal:    Gait: Normal  Joints: No evidence of synovitis, joint swelling, or tenderness in the joints.  Nails: No pitting, clubbing, or cyanosis noted in the fingernails bilaterally   Muscle strength: Strength is grossly 5 out of 5 throughout the bilateral upper and lower extremities    LABS:    Reviewed outside labs from 04/21/2023.    Results for orders placed or performed in visit on 01/05/23   CBC with Diff    Collection Time: 01/05/23 11:23 AM   Result Value Ref Range    WBC 13.17 (H) 4.3 - 10.0 10*3/uL    RBC 4.77 3.80 - 5.00 10*6/uL    Hemoglobin 14.4 11.5 - 15.5 g/dL    Hematocrit 43 16.1 - 45.0 %    MCV 91 81 - 98 fL    MCH 30.2 27.3 - 33.6 pg    MCHC 33.2 32.2 - 36.5 g/dL    Platelet Count 096 045 - 400 10*3/uL    RDW-CV 14.2 11.0 - 14.5 %    % Neutrophils 74 %    % Lymphocytes 16 %    % Monocytes 6 %    % Eosinophils 2 %    % Basophils 1 %    % Immature Granulocytes 1 %    Neutrophils 9.83 (H) 1.80  - 7.00 10*3/uL    Absolute Lymphocyte Count 2.14 1.00 - 4.80 10*3/uL    Monocytes 0.82 (  H) 0.00 - 0.80 10*3/uL    Absolute Eosinophil Count 0.22 0.00 - 0.50 10*3/uL    Basophils 0.10 0.00 - 0.20 10*3/uL    Immature Granulocytes 0.06 (H) 0.00 - 0.05 10*3/uL    Nucleated RBC 0.00 0.00 10*3/uL    % Nucleated RBC 0 %   Comprehensive Metabolic Panel    Collection Time: 01/05/23 11:23 AM   Result Value Ref Range    Sodium 140 135 - 145 meq/L    Potassium 4.1 3.6 - 5.2 meq/L    Chloride 103 98 - 108 meq/L    Carbon Dioxide, Total 28 22 - 32 meq/L    Anion Gap 9 4 - 12    Glucose 93 62 - 125 mg/dL    Urea  Nitrogen 26 (H) 8 - 21 mg/dL    Creatinine 1.61 0.96 - 1.02 mg/dL    Protein (Total) 7.0 6.0 - 8.2 g/dL    Albumin 4.2 3.5 - 5.2 g/dL    Bilirubin (Total) 0.6 0.2 - 1.3 mg/dL    Calcium 9.8 8.9 - 04.5 mg/dL    AST (GOT) 14 9 - 38 U/L    Alkaline Phosphatase (Total) 86 38 - 172 U/L    ALT (GPT) 12 7 - 33 U/L    eGFR by CKD-EPI 2021 >60 >59 mL/min/1.73_m2   C-Reactive Protein    Collection Time: 01/05/23 11:23 AM   Result Value Ref Range    C_Reactive Protein 9.2 0.0 - 10.0 mg/L       RADIOLOGY:   Reviewed CT abd 04/21/2023.    DISEASE ACTIVITY:  Patient Function: 0.7  Patient Pain Assessment: 5.5  Patient Global Assessment: 2.5   Combined Rapid 3 Score: 8.7    ASSESSMENT AND PLAN:  Caitlin Wiley is a very pleasant 73 year old female who presents for follow up of rheumatoid arthritis, osteoarthritis.    #. Rheumatoid arthritis. Seropositive, nonerosive RA. Doing much better since MTX.  No evidence of synovitis on exam today. Knee pain, CMC joint pain sounds mechanical in nature and likely related to osteoarthritis.  We previously discussed therapeutic options including corticosteroid or hyaluronic acid derivative injection in the future.  She is a candidate for total knee replacement surgery once she is able to reach her target BMI.       Stable disease.  She remains in clinical remission. We can consider decrease in  MTX if she continues to do well.      - continue methotrexate  20mg  once a week   - continue folic acid  2mg  daily     #. High risk medication. MTX toxicity labs reviewed from 04/21/2023. Stable.     #. Osteoarthritis. Most bothersome in the knees and is activity related. She is interested in TKR but will need to lose some weight first.  Patient is getting closer to her goal.   Recent discussed restarting physical therapy.  She has difficulty doing exercises at home.  I am concerned about her ambulation.  She also climbs multiple stairs a day.  Referral to PT was provided today.  We did not follow-up on this.     #. Left cubital tunnel neuropathy. Discussed conservative management.     #. Immunization. She we will get updated flu and COVID-vaccine.     #. Bone health. Last DEXA from 2017 was normal. DEXA 06/2018 normal.      - optimization of calcium and vitamin D    - daily weight bearing exercises  All the patient's questions were answered. The patient will follow up in 4 months.    Dictated using Child psychotherapist and electronically signed by Rockney Cid, MD   Note: This document may contain errors inherent to this technology. If you find any errors that might affect patient care, please contact our office as soon as possible.   [address], [address], [place], [place]  [address]  Ph. [phone] Fx. [phone]

## 2023-08-16 ENCOUNTER — Other Ambulatory Visit (HOSPITAL_BASED_OUTPATIENT_CLINIC_OR_DEPARTMENT_OTHER): Payer: Self-pay | Admitting: Rheumatology

## 2023-08-16 DIAGNOSIS — M0579 Rheumatoid arthritis with rheumatoid factor of multiple sites without organ or systems involvement: Secondary | ICD-10-CM

## 2023-08-18 MED ORDER — METHOTREXATE SODIUM 2.5 MG OR TABS
20.0000 mg | ORAL_TABLET | ORAL | 0 refills | Status: DC
Start: 2023-08-18 — End: 2023-09-13

## 2023-08-24 ENCOUNTER — Ambulatory Visit: Payer: Medicare HMO | Attending: Cardiovascular Disease | Admitting: Cardiovascular Disease

## 2023-08-24 VITALS — BP 104/74 | HR 97 | Ht 66.0 in | Wt 253.0 lb

## 2023-08-24 DIAGNOSIS — I480 Paroxysmal atrial fibrillation: Secondary | ICD-10-CM | POA: Insufficient documentation

## 2023-08-24 DIAGNOSIS — I10 Essential (primary) hypertension: Secondary | ICD-10-CM | POA: Insufficient documentation

## 2023-08-24 NOTE — Progress Notes (Signed)
 [name] returns for management of paroxysmal atrial fibrillation and hypertension.  Additional problems include rheumatoid arthritis on methotrexate  and sleep apnea.  She also reports recent cholecystectomy.    She reports blood pressure never higher than 130/80, typically between 100 and 120 systolic and between 70 and 76 diastolic.  She describes occasional palpitations only lasting seconds, never more than 1 minute.  She denies bleeding or TIA symptoms.  Level of activity is low as she is limited by bilateral knee osteoarthritis.  She uses a walker to get around mostly for stability.  Overall her weight is down 29 pounds but she wants to start being more physically active.    Review of patient's allergies indicates:  Allergies   Allergen Reactions    Bee Venom Anaphylaxis    Ciprofloxacin Other     Joint aching    Pneumococcal Vaccines Skin: Hives    Sulfa Antibiotics Skin: Hives    Cats [Animals] Skin: Itching    Dust Mite Extract Skin: Itching     DUST    Pollen Extract Skin: Itching     Current Outpatient Medications   Medication Sig Dispense Refill    acetaminophen 500 MG tablet Take 1 tablet (500 mg) by mouth 2 times a day.      albuterol  HFA 108 (90 Base) MCG/ACT inhaler inhale 1 to 2 puffs by mouth every 6 hours if needed for shortness of breath or wheezing 8.5 g 3    apixaban  (Eliquis ) 5 MG tablet Take 1 tablet (5 mg) by mouth 2 times a day. 180 tablet 3    Cholecalciferol (VITAMIN D3 OR) Take by mouth.      clobetasol  0.05 % ointment APPLY TOPICALLY TO AFFECTED AREAS OF VULVA three times a week 60 g 3    famotidine  20 MG tablet Take 1 tablet (20 mg) by mouth 2 times a day. 180 tablet 3    Flaxseed, Linseed, (FLAXSEED OIL) 1200 MG Oral Cap None Entered      folic acid  1 MG tablet Take 3 tablets (3 mg) by mouth daily. 270 tablet 3    GLUCOSAMINE CHONDROITIN COMPLX OR       Magnesium 400 MG Oral Cap Take 1 capsule (400 mg) by mouth.      methotrexate  2.5 MG tablet TAKE 8 TABLETS BY MOUTH EVERY WEEK 96  tablet 0    montelukast  10 MG tablet Take 1 tablet (10 mg) by mouth daily. 90 tablet 3    Multiple Vitamins-Minerals (MULTIVITAMIN OR) no iron      Probiotic Product (PROBIOTIC DAILY OR)       SIMETHICONE-80 OR Take 2-3 tablets by mouth daily as needed.      triamterene -hydroCHLOROthiazide 37.5-25 MG tablet Take 1 tablet by mouth daily. 90 tablet 3     No current facility-administered medications for this visit.     Patient Active Problem List   Diagnosis    Asthma (HCC)    Edema    Sleep apnea    Pulmonary nodule    GERD (gastroesophageal reflux disease)    Paroxysmal atrial fibrillation (HCC)    Chronic anticoagulation    Primary osteoarthritis of both knees    Rheumatoid arthritis involving multiple sites with positive rheumatoid factor (HCC)    Cervical polyp    Lichen sclerosus    Immunosuppression (HCC)     BP 104/74   Pulse 97   Ht 5' 6 (1.676 m)   Wt (!) 114.8 kg (253 lb)   LMP  (  LMP Unknown)   SpO2 96%   BMI 40.84 kg/m   She is alert and comfortable, no carotid bruit, JVP flat, lungs clear, rhythm regular with no murmur or gallop, no peripheral edema.    Laboratory: Metabolic panel in April normal except for potassium of 3.4; last lipid panel in November 2022 showed total cholesterol of 180, triglycerides 80, HDL 64 and LDL 101 mg/dL    A/P:  (P51.9) Paroxysmal atrial fibrillation (HCC)  (primary encounter diagnosis)  No prolonged palpitations, low burden of arrhythmia overall.  Plan: Continue observation along with long-term anticoagulation in the setting of a CHA2DS2-VASc score of 3 for age, sex and hypertension.    (I10) Essential hypertension  Blood pressure is well-controlled.  Potassium is slightly low in April.  Plan: Continue present therapy with triamterene  HCTZ, increase potassium intake in the diet with fruit and bananas.  We again discussed exercise, and she plans to go to the [facility] and use their exercise equipment.    I spent a total of 32 minutes for the patient's care on the date of  the service.

## 2023-08-24 NOTE — Patient Instructions (Signed)
 Your exam is fine today, your blood pressure is well controlled, you are not having prolonged arrhythmias.  I don't recommend any medication changes. Be sure to eat more fruit for the potassium supplement.      We discussed using a recumbent bike or NuStep like device for exercise.  You can go the [facility] and use their NuStep and recumbent bike AND strength machines.  You can wear your mask in the gym!    We can see you again in one year.

## 2023-09-13 ENCOUNTER — Ambulatory Visit (HOSPITAL_BASED_OUTPATIENT_CLINIC_OR_DEPARTMENT_OTHER)

## 2023-09-13 ENCOUNTER — Ambulatory Visit: Attending: Rheumatology | Admitting: Rheumatology

## 2023-09-13 ENCOUNTER — Other Ambulatory Visit (HOSPITAL_BASED_OUTPATIENT_CLINIC_OR_DEPARTMENT_OTHER): Payer: Self-pay | Admitting: Rheumatology

## 2023-09-13 VITALS — BP 115/73 | HR 75 | Wt 259.0 lb

## 2023-09-13 DIAGNOSIS — M170 Bilateral primary osteoarthritis of knee: Secondary | ICD-10-CM | POA: Insufficient documentation

## 2023-09-13 DIAGNOSIS — Z79899 Other long term (current) drug therapy: Secondary | ICD-10-CM | POA: Insufficient documentation

## 2023-09-13 DIAGNOSIS — L659 Nonscarring hair loss, unspecified: Secondary | ICD-10-CM | POA: Insufficient documentation

## 2023-09-13 DIAGNOSIS — M0579 Rheumatoid arthritis with rheumatoid factor of multiple sites without organ or systems involvement: Secondary | ICD-10-CM | POA: Insufficient documentation

## 2023-09-13 LAB — CBC, DIFF
% Basophils: 1 %
% Eosinophils: 2 %
% Immature Granulocytes: 0 %
% Lymphocytes: 23 %
% Monocytes: 6 %
% Neutrophils: 68 %
% Nucleated RBC: 0 %
Absolute Eosinophil Count: 0.21 10*3/uL (ref 0.00–0.50)
Absolute Lymphocyte Count: 2.08 10*3/uL (ref 1.00–4.80)
Basophils: 0.07 10*3/uL (ref 0.00–0.20)
Hematocrit: 42 % (ref 36.0–45.0)
Hemoglobin: 13.9 g/dL (ref 11.5–15.5)
Immature Granulocytes: 0.03 10*3/uL (ref 0.00–0.05)
MCH: 29.5 pg (ref 27.3–33.6)
MCHC: 32.9 g/dL (ref 32.2–36.5)
MCV: 90 fL (ref 81–98)
Monocytes: 0.56 10*3/uL (ref 0.00–0.80)
Neutrophils: 6.17 10*3/uL (ref 1.80–7.00)
Nucleated RBC: 0 10*3/uL
Platelet Count: 304 10*3/uL (ref 150–400)
RBC: 4.71 10*6/uL (ref 3.80–5.00)
RDW-CV: 14.4 % (ref 11.0–14.5)
WBC: 9.12 10*3/uL (ref 4.3–10.0)

## 2023-09-13 LAB — COMPREHENSIVE METABOLIC PANEL
ALT (GPT): 16 U/L (ref 7–33)
AST (GOT): 17 U/L (ref 9–38)
Albumin: 4 g/dL (ref 3.5–5.2)
Alkaline Phosphatase (Total): 74 U/L (ref 38–172)
Anion Gap: 9 (ref 4–12)
Bilirubin (Total): 0.7 mg/dL (ref 0.2–1.3)
Calcium: 9.2 mg/dL (ref 8.9–10.2)
Carbon Dioxide, Total: 25 meq/L (ref 22–32)
Chloride: 105 meq/L (ref 98–108)
Creatinine: 0.83 mg/dL (ref 0.38–1.02)
Glucose: 94 mg/dL (ref 62–125)
Potassium: 4 meq/L (ref 3.6–5.2)
Protein (Total): 6.7 g/dL (ref 6.0–8.2)
Sodium: 139 meq/L (ref 135–145)
Urea Nitrogen: 25 mg/dL — ABNORMAL HIGH (ref 8–21)
eGFR by CKD-EPI 2021: >60 mL/min/1.73_m2 (ref >59)

## 2023-09-13 LAB — C_REACTIVE PROTEIN: C_Reactive Protein: 6.5 mg/L (ref 0.0–10.0)

## 2023-09-13 LAB — THYROID STIMULATING HORMONE: Thyroid Stimulating Hormone: 2.732 u[IU]/mL (ref 0.400–5.000)

## 2023-09-13 LAB — IRON BINDING CAPACITY (W/IRON, TRANSFERRIN & TRANSF SAT)
Iron, SRM: 75 ug/dL (ref 31–171)
Total Iron Binding Capacity: 372 ug/dL (ref 270–535)
Transferrin Saturation: 20 % (ref 10–45)
Transferrin: 266 mg/dL (ref 192–382)

## 2023-09-13 MED ORDER — METHOTREXATE SODIUM 2.5 MG OR TABS
20.0000 mg | ORAL_TABLET | ORAL | 1 refills | Status: AC
Start: 2023-09-13 — End: ?

## 2023-09-13 NOTE — Patient Instructions (Signed)
 It was a pleasure seeing you in clinic today.    Decrease methotrexate  to 7 tabs once a week x2 weeks then decrease again to 6 tabs once a week  Okay to increase folic acid  to 4mg  daily  Get labs after your appointment today  Follow up in 4 months    If you have any questions or concerns before your next appointment, please do not hesitate to contact me. Our clinic phone number is [phone] and I am also available via [facility].

## 2023-09-13 NOTE — Progress Notes (Signed)
 [address] [place] [address]  [address]  [place], [place]  [address]  TEL: [phone]  l  FAX: [phone]    Follow Up Clinic Note  09/13/2023    IDENTIFYING DATA/CC:    Caitlin Wiley is a very pleasant 73 year old female who presents for follow up of rheumatoid arthritis, osteoarthritis.    RHEUMATOLOGIC HISTORY OF PRESENT ILLNESS:  Diagnosis: RA, OA; initial contult 11/30/2017  Presenting Symptoms/Timeline: 2013 joint pain hands, wrists, knees, shoulders  Autoimmune Serologies: RF 140, CCP 16; CRP 17, ESR 23; UA 4.6  Pertinent Radiology: Xrays of hands, wrists, knees, feet: No erosions, consistent with OA, chondrocalcinosis  Medications Used: MXT 11/2017  Significant Flares:  Immunization Status: Influenza, PCV13, PPSV23, Zoster  Infectious Disease Screening: Hepatitis B NR, Hepatitis C, TB quantiferon NR  Other: Afib on chronic anticoag,     INTERVAL HISTORY:  Patient returns for follow-up of rheumatoid arthritis, osteoarthritis.  She was last seen 4 months ago at which time she reported doing poorly.  This was due to abdominal pain presumed due to diverticulitis.  She did have some improvement after antibiotic use.  Patient's rheumatoid arthritis remained stable.  She is on methotrexate  monotherapy for treatment of her inflammatory arthritis.    Reports stable RA. More OA pain due to increase activity. Her cat is ill with pancreatitis and diabetes. She has to get up in middle of night for insulin injects for cat. Some anxiety due to her cat's health.    Also notes more hair loss. Wonders if she can take additional folic acid .    REVIEW OF SYSTEMS:  Complete ROS is negative, except for those mentioned above in the Interval History    PROBLEM LIST:  Patient Active Problem List    Diagnosis Date Noted    Immunosuppression (HCC) [D84.9] 11/20/2020    Lichen sclerosus [L90.0] 04/30/2020    Cervical polyp [N84.1] 01/10/2020    Rheumatoid arthritis involving multiple sites with positive rheumatoid factor  (HCC) [M05.79] 02/08/2018    Primary osteoarthritis of both knees [M17.0] 09/12/2017    Chronic anticoagulation [Z79.01] 08/02/2015    Paroxysmal atrial fibrillation (HCC) [I48.0] 04/17/2014     A. S/p ablation (Cryo PVI) 10/19/2016      GERD (gastroesophageal reflux disease) [K21.9] 11/08/2012    Asthma (HCC) [J45.909] 07/24/2012    Edema [R60.9] 07/24/2012    Sleep apnea [G47.30] 07/24/2012    Pulmonary nodule [R91.1] 07/24/2012       SOCIAL/FAMILY HISTORY:  Reviewed with patient. No changes from what is currently noted in EPIC    ALLERGIES:  Review of patient's allergies indicates:  Allergies   Allergen Reactions    Bee Venom Anaphylaxis    Ciprofloxacin Other     Joint aching    Pneumococcal Vaccines Skin: Hives    Sulfa Antibiotics Skin: Hives    Cats [Animals] Skin: Itching    Dust Mite Extract Skin: Itching     DUST    Pollen Extract Skin: Itching       MEDICATIONS:  Current Outpatient Medications   Medication Sig Dispense Refill    acetaminophen 500 MG tablet Take 1 tablet (500 mg) by mouth 2 times a day.      albuterol  HFA 108 (90 Base) MCG/ACT inhaler inhale 1 to 2 puffs by mouth every 6 hours if needed for shortness of breath or wheezing 8.5 g 3    apixaban  (Eliquis ) 5 MG tablet Take 1 tablet (5 mg) by mouth 2  times a day. 180 tablet 3    Cholecalciferol (VITAMIN D3 OR) Take by mouth.      clobetasol  0.05 % ointment APPLY TOPICALLY TO AFFECTED AREAS OF VULVA three times a week 60 g 3    famotidine  20 MG tablet Take 1 tablet (20 mg) by mouth 2 times a day. 180 tablet 3    Flaxseed, Linseed, (FLAXSEED OIL) 1200 MG Oral Cap None Entered      folic acid  1 MG tablet Take 3 tablets (3 mg) by mouth daily. 270 tablet 3    GLUCOSAMINE CHONDROITIN COMPLX OR       Magnesium 400 MG Oral Cap Take 1 capsule (400 mg) by mouth.      methotrexate  2.5 MG tablet TAKE 8 TABLETS BY MOUTH EVERY WEEK 96 tablet 0    montelukast  10 MG tablet Take 1 tablet (10 mg) by mouth daily. 90 tablet 3    Multiple Vitamins-Minerals  (MULTIVITAMIN OR) no iron      Probiotic Product (PROBIOTIC DAILY OR)       SIMETHICONE-80 OR Take 2-3 tablets by mouth daily as needed.      triamterene -hydroCHLOROthiazide 37.5-25 MG tablet Take 1 tablet by mouth daily. 90 tablet 3     No current facility-administered medications for this visit.       PHYSICAL EXAMINATION:  Vital signs:  BP 115/73   Pulse 75   Wt (!) 117.5 kg (259 lb)   LMP  (LMP Unknown)   BMI 41.80 kg/m   General:  Awake, alert, and oriented, no apparent distress, pleasant, and cooperative  Psychologic:  Mood is euthymic, affect is congruent  EYES: Anicteric sclera, no conjunctival injection  ENT:  Normocephalic, atraumatic, moist membranes, no oral ulcers   Pulmonary:  Non-labored breathing, no wheezes, rhonchi, or rales  Cardiovascular:  RRR, no murmurs, rubs or gallops  Skin:  Normal temperature and texture, No visible rashes, ulcers, or nodules  Neurologic:  Light touch sensation is grossly intact. Face symmetric   Musculoskeletal:    Gait: Normal  Joints: No evidence of synovitis, joint swelling, or tenderness in the joints.  Nails: No pitting, clubbing, or cyanosis noted in the fingernails bilaterally   Muscle strength: Strength is grossly 5 out of 5 throughout the bilateral upper and lower extremities    LABS:    Pending    RADIOLOGY:   None    DISEASE ACTIVITY:  Patient Function: 1.3  Patient Pain Assessment: 4  Patient Global Assessment: 2   Combined Rapid 3 Score: 7.3    ASSESSMENT AND PLAN:  Caitlin Wiley is a very pleasant 73 year old female who presents for follow up of rheumatoid arthritis, osteoarthritis.    #. Rheumatoid arthritis. Seropositive, nonerosive RA. Doing much better since MTX.  No evidence of synovitis on exam today. Knee pain, CMC joint pain sounds mechanical in nature and likely related to osteoarthritis. We previously discussed therapeutic options including corticosteroid or hyaluronic acid derivative injection in the future.  She is a candidate for  total knee replacement surgery once she is able to reach her target BMI.       Stable disease.  She remains in clinical remission. We will decrease MTX to 15mg  once a week. Unclear if hair loss related to MTX but reduced dose should help.      - decrease methotrexate  15mg  once a week   - increase folic acid  4mg  daily     #. High risk medication. MTX toxicity labs today.    #.  Hair loss. Will also check TSH, iron panel.     #. Osteoarthritis. Most bothersome in the knees and is activity related. She is interested in TKR but will need to lose some weight first.  Patient is getting closer to her goal.   Recent discussed restarting physical therapy.  She has difficulty doing exercises at home.  I am concerned about her ambulation.  She also climbs multiple stairs a day.  Referral to PT was provided today.  We did not follow-up on this.     #. Left cubital tunnel neuropathy. Discussed conservative management.     #. Immunization. She we will get updated flu and COVID-vaccine.     #. Bone health. Last DEXA from 2017 was normal. DEXA 06/2018 normal.      - optimization of calcium and vitamin D    - daily weight bearing exercises       All the patient's questions were answered. The patient will follow up in 4 months.    Dictated using Child psychotherapist and electronically signed by Arvil Lango, MD   Note: This document may contain errors inherent to this technology. If you find any errors that might affect patient care, please contact our office as soon as possible.   [address], [place], [place]  [address]  Ph. [phone] Fx. [phone]

## 2023-09-15 ENCOUNTER — Emergency Department: Payer: Self-pay

## 2023-09-15 NOTE — ED Notes (Signed)
 Pt evaluated and DC'd by Dr. Larose, not seen by this LPN, please defer to their assessment.

## 2023-09-15 NOTE — ED Triage Notes (Addendum)
 Pt reports she developed a discomfort to her left upper back last night possibly from wrestling a hose before that. This discomfort persisted today so she checked her BP and it was 190s systolic and this worried her. Currently her BP is 148/73. She took her usual BP meds this morning. She currently denies HA/DIZZINESS/CP/SOB/vision changes.      [facility] Fall Risk Assessment  Is the patient 72 years old or greater? YES  Did the patient present to the ED for falling (ex syncope, seizure or loss of consciousness)? NO  Does the patient have a cognitive impairment (ex. AMS, dementia, intoxication or inability to follow instructions)? NO  Does the patient have impaired mobility (unable to ambulate or needs assistance to ambulate/transfer)? YES  Nursing judgement indicates high fall risk YES    Yes to any of the above questions = high fall risk for falls per [facility]    Interventions initiated:  Personal items within reach

## 2023-09-15 NOTE — ED Provider Notes (Signed)
 [facility]      Patient Name: Caitlin Wiley  Medical Record Number: [id number]  Patient's Phone Number: [phone] (home)   Visit Date/Time: 09/15/2023 at 10:22 am  Primary Care Provider: Elvie FORBES Sar, MD      HISTORY      Nursing Triage Note:    ED Triage Notes by Bennet Lorence JAYSON Pati, RN at 09/15/23 1039          Pt reports she developed a discomfort to her left upper back last night possibly from wrestling a hose before that. This discomfort persisted today so she checked her BP and it was 190s systolic and this worried her. Currently her BP is 148/73. She took her usual BP meds this morning. She currently denies HA/DIZZINESS/CP/SOB/vision changes.      [facility] Fall Risk Assessment  Is the patient 73 years old or greater? YES  Did the patient present to the ED for falling (ex syncope, seizure or loss of consciousness)? NO  Does the patient have a cognitive impairment (ex. AMS, dementia, intoxication or inability to follow instructions)? NO  Does the patient have impaired mobility (unable to ambulate or needs assistance to ambulate/transfer)? YES  Nursing judgement indicates high fall risk YES    Yes to any of the above questions = high fall risk for falls per [facility]    Interventions initiated:  Personal items within reach             HPI/Additional History:   Caitlin Wiley is a 73 y.o. female who presents to the Emergency Department via Car with Hypertension (Asymptomatic)      History of Present Illness  This is a 73 year old female with a history of hypertension and atrial fibrillation presenting with elevated blood pressure.    The patient reports experiencing pain on the left side of her scapula and in the middle of her back, which she first noticed on the night of 09/14/2023. She attributes the pain to physical exertion while gardening. Upon waking up on 09/15/2023, she found her blood pressure to be 192/112, significantly higher than her usual readings  of around 125/70 or lower. She took her triamterene -HCTZ after this incident. She also reports feeling cold and shivering under her bed covers, and upon getting up, her blood pressure spiked again. She reports no fevers, runny nose, cough, headaches, or vision changes. Her current medication regimen includes triamterene -HCTZ 37.5/25 mg, which she took this morning.    The patient describes the pain as persistent and not influenced by arm movement. She reports no chest pain or shortness of breath. The pain has since resolved but feels different from before.      Past Medical History:   Past Medical History[1]  Problem List[2]    Past Surgical History:  Past Surgical History[3]    Social History:   Social History[4]    Family History:   Family History[5]   Family history reviewed by me    Home Medications list:   Current Outpatient Medications on File Prior to Encounter   Medication Sig   . Acetaminophen (TYLENOL PO) Take 650 mg by mouth 2 tablets  up to twice daily PRN, 8 hrs apart.   . ACETAMINOPHEN EXTRA STRENGTH 500 MG tablet take 1-2 tablets by mouth every 6 hours if needed for pain   . albuterol  (PROAIR  HFA) 90 mcg/puff inhaler Inhale 2 puffs into the lungs every 4 hours.   SABRA amoxicillin-clavulanate (AUGMENTIN) 875-125 mg per tablet Take 1  tablet by mouth 2 times daily.   . apixaban  (ELIQUIS ) 5 mg tablet Take 1 tablet by mouth 2 times daily.   . cholecalciferol 50 mcg (2,000 units) tablet Take 1 tablet by mouth Daily 2 tablets daily.   . clobetasol  (TEMOVATE ) 0.05% ointment apply topically to VULVAR AREA THREE TIMES WEEKLY   . famotidine  (PEPCID ) 20 mg tablet Take 1 tablet by mouth 2 times daily.   . folic acid  1 mg tablet take 3 tablets by mouth once daily   . GLUCOSAMINE CHONDROITIN COMPLX PO Take 3,000 mg by mouth Daily.   . Magnesium Oxide 400 MG CAPS Take 400 mg by mouth.   . methotrexate  2.5 mg tablet    . montelukast  (SINGULAIR ) 10 mg tablet Take 1 tablet by mouth Daily.   . Multiple Vitamin  (MULTI-VITAMIN PO) Take by mouth Daily No iron.   . ondansetron  (ZOFRAN  ODT) 4 mg disintegrating tablet Take 1 tablet by mouth every 8 hours as needed for Nausea.   SABRA oxyCODONE (ROXICODONE) 5 mg tablet Take 1 tablet by mouth every 4 hours as needed.   . polyethylene glycol (MIRALAX) 17 g/capful powder Take 17 g by mouth Daily.   . Probiotic Product (PROBIOTIC BLEND PO) Take by mouth Daily.   . triamterene -hydrochlorothiazide (DYAZIDE) 37.5-25 MG per capsule Take 1 capsule by mouth Daily.       Allergies:   Allergies[6]    PHYSICAL EXAM     Vitals:    09/15/23 1028 09/15/23 1228   BP: 148/73 146/83   Pulse: 107 89   Resp: 18 18   Temp: 36.5 C (97.7 F) 36.6 C (97.8 F)   TempSrc: Infrared Device Temporal   SpO2: 97% 97%   Weight: 117 kg (258 lb)    Height: 1.676 m (5' 6)        Physical Exam:  General/constitutional: Sitting up in chair, no acute distress  Head/Neck: Atraumatic, normocephalic. neck supple  Chest/Back: Mild amount of paraspinal tenderness on the left thoracic spine  Eyes: normal conjunctiva   ENMT: Moist mucus membranes  Cardiovascular: Regular rhythm.  Rate as in vitals section. Symmetric radial pulses.   Respiratory: Speaks in full sentences without distress.  Clear to auscultation bilaterally with equal breath sounds, no wheezing or crackles or stridor.    Genitourinary: Deferred  Musculoskeletal: No deformities or pain with range of motion.  Skin: No visible rashes. No lacerations  Neurologic: Alert, oriented 3.  GCS 15.  Cranial nerves II through XII grossly intact.  5 out of 5 strength in bilateral upper and lower extremities.  Sensation intact to light touch throughout.  Psychiatric: Calm, cooperative      ASSESSMENT AND PLAN     AMOUNT AND/OR COMPLEXITY OF DATA REVIEWED    Independent Historian: None- history from patient (See HPI).=    I reviewed the following prior notes, labs, or studies:  Documents from last hospital admission reviewed: Previous admit date: N/A    Documents from last  surgery reviewed: Laparoscopic Cholecystectomy 12/01/2022       MEDICAL DECISION MAKING    The history and physical exam offered a broad differential including:  Hypertension, musculoskeletal pain, ACS, pneumonia, pneumothorax      MEDICATIONS AND ED COURSE    Medications - No data to display         RESULTS  Results        ECG (personally interpreted today): NSR with rate of 88, right bundle branch block, similar to prior  Labs Reviewed   COMPREHENSIVE METABOLIC PANEL - Abnormal; Notable for the following components:       Result Value    BUN/Creatinine Ratio 28.0 (*)     All other components within normal limits   CBC WITH DIFFERENTIAL - Abnormal; Notable for the following components:    Absolute Immature Granulocytes 0.06 (*)     All other components within normal limits   POC TROPONIN I (RESULTS ONLY)       XR Chest AP Portable   Final Result      1. No acute cardiopulmonary disease.      RADIA      Dictated By: Zobel, Mark MD 2023-09-15 12:14:21.757   Signed By: Zobel, Mark MD 2023-09-15 12:15:22.0   Transcribed By: Zobel, Mark 2023-09-15 12:15:22.51         SITE ID: [id number]   Patients are advised to discuss imaging findings and recommendations with the ordering provider.          Discussion of management or test interpretation with external provider(s): N/A      EMERGENCY DEPARTMENT MDM RISK AND DISPOSITION  Assessment & Plan  Patient is a 73 year old female with history of hypertension, A-fib on Eliquis  presenting to the emergency department with left scapular pain, high blood pressure readings at home.  On arrival patient afebrile, well-appearing, no acute distress.  BP 148/73.  Heart rate 107.  Cardiorespiratory exam is benign, EKG without signs of active ischemia.  Patient reports minimal pain now while at rest, does have some tenderness in the upper thoracic paraspinal region on the left.    Will plan for labs, troponin, chest x-ray, will reevaluate.  She is anticoagulated on Eliquis  with no hypoxia or  shortness of breath to suggest PE.  Pain does seem musculoskeletal in nature.    Labs reviewed, no significant AKI or electrolyte abnormalities, troponin negative.  Chest x-ray is clear with no pneumonia, pneumothorax, or mediastinal widening.  Blood pressure remained 146/83 in the emergency department.  Unclear cause for her hypertension this morning, maybe was related to pain.  Discussed with patient, she will return with any worsening symptoms such as chest pain, shortness of breath, or any other new concerns.  Otherwise will follow-up with primary care.  Discharged home.        The following social determinants of health which impacted patient care and disposition include NA.    The following risk of morbidity from additional diagnostic testing or treatment were discussed and considered:  Prescription drug management (Given in ED, prescribed for home, modified or discontinued home medications) and Decision regarding hospitalization      Clinical Impression:    1. Hypertension, unspecified type    2. Acute left-sided thoracic back pain        Disposition:  discharge    Elvie Mariella Sar, MD  [address] [place] [address]  [phone]            New Prescriptions:   Discharge Medication List as of 09/15/2023 12:28 PM              PROVIDER ATTESTATION     Lorane JAYSON Ribas, MD  09/16/2023  1:01 PM PDT      Documentation services were performed after the patient or guardian (and all others present) consented on 09/16/23 to allow Dragon Ambient experience (DAX) to record audio during this visit. ILorane Ribas, MD, reviewed this note before signing.     This document was generated in  part using voice recognition software, occasional wrong-word or "sound-alike" substitutions may have occurred due to the inherent limitations of voice recognition software. Read the chart carefully and recognize, using context, where these substitutions have occurred. In addition entries are made by physician typing  and scribe typing.  Although every effort is made to edit the content, transcription and typing errors may occur.             [1]  Past Medical History:  Diagnosis Date   . Acid reflux disease    . Arthritis    . Asthma (HCC)     uses inhaler very rarely   . Atrial fibrillation (HCC)     cardioversion x2, ablation   . Breast disorder 05-27-14    see order   . Diverticulitis    . High blood pressure    . Osteoarthritis    . Range of motion deficit     walker out of house   . Sleep apnea     CPAP   . Unspecified asthma(493.90)     triggered by pesticides, diesel fumes   [2]  Patient Active Problem List  Diagnosis   . Asthma   . Hypercholesterolemia   . History of colonic polyps   . Edema   . Sleep apnea   . HTN (hypertension)   . Menopausal state   . Rash   . Pulmonary nodule   . OA (osteoarthritis)   . Palpitations   [3]  Past Surgical History:  Procedure Laterality Date   . cardiac ablation  10/2016   . CATARACT REMOVAL WITH IMPLANT  2024   . CHOLECYSTECTOMY, LAPAROSCOPIC N/A 12/01/2022    Procedure: LAPAROSCOPIC CHOLECYSTECTOMY;  Laterality: N/A;  Surgeon: Chesley JAYSON Desanctis, MD;  Location: [facility]   . KNEE SURGERY Right 2001    scope   . NECK SURGERY      laminectomy   . PR CARDIOVERSION ELECTIVE ARRHYTHMIA EXTERNAL  07/08/2016    Procedure: CARDIOVERSION ELECTRICAL;  Surgeon: Murry Lynwood BRAVO, MD;  Location: [facility];  Service: Cardiology   [4]  Social History  Tobacco Use   . Smoking status: Former     Average packs/day: 0.2 packs/day for 10.0 years (2.0 ttl pk-yrs)     Types: Cigarettes     Start date: 1973     Passive exposure: Past   . Smokeless tobacco: Never   Vaping Use   . Vaping status: Never Used   Substance Use Topics   . Alcohol use: Not Currently     Comment: Alcoholic Drinks/day: rare   . Drug use: Never     Comment: Drug use: No   [5]  Family History  Problem Relation Name Age of Onset   . Breast cancer Mother  33   . Breast cancer Paternal Grandmother  60   . Coronary artery disease Father           aneurysm   [6]  Allergies  Allergen Reactions   . Bee Venom Swelling     As child, facial swelling, benadryl manages localized swelling   . Ciprofloxacin Myalgia     Joint aching   . Sulfa Antibiotics Hives     Legs and arms    Lorane JAYSON Ribas, MD  09/16/23 [phone]

## 2023-09-20 ENCOUNTER — Encounter (HOSPITAL_BASED_OUTPATIENT_CLINIC_OR_DEPARTMENT_OTHER): Payer: Self-pay | Admitting: Cardiovascular Disease

## 2023-09-20 ENCOUNTER — Encounter (INDEPENDENT_AMBULATORY_CARE_PROVIDER_SITE_OTHER): Payer: Self-pay | Admitting: Family Medicine

## 2023-09-20 NOTE — Telephone Encounter (Signed)
 Patient experienced left upper back pain with associated high BP (192/112) and went to ED on 8/28. Upon arrival her BP was down to 148/73, troponin neg, EKG unchanged from prior. No changes were made to medications at that time.    I spoke with the patient on the phone today and she states I feel fine but my BP is still all over the place. Her BP has re-spiked since her ED visit. The highest BP was 172/112. She had walked up her 7 steps and then sat down and waited a minute before taking.     This AM just after getting out of bed it was 154/90. Tried on her other wrist and it was 125/88.    HR has been 80-85. It has been in a sinus rhythm.    Denies CP, SOB, HA, dizziness.      Takes her triamterene -HCTZ 37.5-25 mg at 7 AM. High BP happens all throughout the day. BP was 130/80 one hour after medication today.    We discussed her recent labs from the ED visit. Only abnormal result was her BUN at 28. She usually drinks five 28 oz bottles of water a day, so she doesn't think she's dehydrated.    She admits to being stressed recently with an ill cat, an art show, and retirement paperwork.    Discussed that her wrist BP cuff is 74-28 years old. Research shows that life expectancy for these is 2-5 years. We discussed her getting a new BP cuff.     I advised that she keep a 5 day BP log to send to the clinic. Recommended that should she have high BP with associated symptoms of CP, SOB, or HA that she should call 911 or have someone take her to the ED.    Routing to Dr. Jackquline to review and advise.   --------------------------------------------------------------  From Dr. Darlena OV 08/24/23:    A/P:  (I48.0) Paroxysmal atrial fibrillation (HCC)  (primary encounter diagnosis)  No prolonged palpitations, low burden of arrhythmia overall.  Plan: Continue observation along with long-term anticoagulation in the setting of a CHA2DS2-VASc score of 3 for age, sex and hypertension.     (I10) Essential hypertension  Blood pressure  is well-controlled.  Potassium is slightly low in April.  Plan: Continue present therapy with triamterene  HCTZ, increase potassium intake in the diet with fruit and bananas.  We again discussed exercise, and she plans to go to the [facility] and use their exercise equipment.

## 2023-09-20 NOTE — Telephone Encounter (Signed)
 Forward to Dr. Mariella, please see my chart message and advise on SDA to ok for next available office.

## 2023-09-21 NOTE — Telephone Encounter (Signed)
 Please offer appointment to address blood pressure concerns.  Ask that she bring in home blood pressure monitor.  SDH is OK.

## 2023-09-21 NOTE — Telephone Encounter (Signed)
 Called pt relayed below message. Pt declined appointment. States that she checked in with her cardiologist and she going to track her BP for a week and will report back to her cardiologist and also sent it to Dr. Mariella.

## 2023-09-26 NOTE — Telephone Encounter (Signed)
 Please see attached home BP readings

## 2024-01-01 ENCOUNTER — Other Ambulatory Visit (HOSPITAL_BASED_OUTPATIENT_CLINIC_OR_DEPARTMENT_OTHER): Payer: Self-pay | Admitting: Rheumatology

## 2024-01-01 DIAGNOSIS — M0579 Rheumatoid arthritis with rheumatoid factor of multiple sites without organ or systems involvement: Secondary | ICD-10-CM

## 2024-01-03 MED ORDER — FOLIC ACID 1 MG OR TABS: 3.0000 mg | ORAL_TABLET | Freq: Every day | ORAL | 3 refills | Status: AC

## 2024-01-23 ENCOUNTER — Other Ambulatory Visit (INDEPENDENT_AMBULATORY_CARE_PROVIDER_SITE_OTHER): Payer: Self-pay | Admitting: Family Medicine

## 2024-01-23 DIAGNOSIS — J4520 Mild intermittent asthma, uncomplicated: Secondary | ICD-10-CM

## 2024-01-24 ENCOUNTER — Ambulatory Visit: Attending: Rheumatology | Admitting: Rheumatology

## 2024-01-24 ENCOUNTER — Other Ambulatory Visit (INDEPENDENT_AMBULATORY_CARE_PROVIDER_SITE_OTHER): Payer: Self-pay | Admitting: Family Medicine

## 2024-01-24 ENCOUNTER — Ambulatory Visit (HOSPITAL_BASED_OUTPATIENT_CLINIC_OR_DEPARTMENT_OTHER): Payer: Self-pay | Admitting: Rheumatology

## 2024-01-24 ENCOUNTER — Ambulatory Visit (HOSPITAL_BASED_OUTPATIENT_CLINIC_OR_DEPARTMENT_OTHER)

## 2024-01-24 ENCOUNTER — Ambulatory Visit (INDEPENDENT_AMBULATORY_CARE_PROVIDER_SITE_OTHER): Payer: Self-pay | Admitting: Family Medicine

## 2024-01-24 VITALS — BP 129/78 | HR 82

## 2024-01-24 DIAGNOSIS — I480 Paroxysmal atrial fibrillation: Secondary | ICD-10-CM

## 2024-01-24 DIAGNOSIS — M170 Bilateral primary osteoarthritis of knee: Secondary | ICD-10-CM | POA: Insufficient documentation

## 2024-01-24 DIAGNOSIS — M0579 Rheumatoid arthritis with rheumatoid factor of multiple sites without organ or systems involvement: Secondary | ICD-10-CM | POA: Insufficient documentation

## 2024-01-24 DIAGNOSIS — Z79899 Other long term (current) drug therapy: Secondary | ICD-10-CM | POA: Insufficient documentation

## 2024-01-24 LAB — COMPREHENSIVE METABOLIC PANEL
ALT (GPT): 18 U/L (ref 7–33)
AST (GOT): 18 U/L (ref 9–38)
Albumin: 4.2 g/dL (ref 3.5–5.2)
Alkaline Phosphatase (Total): 83 U/L (ref 38–172)
Anion Gap: 10 (ref 4–12)
Bilirubin (Total): 0.9 mg/dL (ref 0.2–1.3)
Calcium: 9.5 mg/dL (ref 8.9–10.2)
Carbon Dioxide, Total: 23 meq/L (ref 22–32)
Chloride: 106 meq/L (ref 98–108)
Creatinine: 0.84 mg/dL (ref 0.38–1.02)
Glucose: 99 mg/dL (ref 62–125)
Potassium: 3.8 meq/L (ref 3.6–5.2)
Protein (Total): 6.8 g/dL (ref 6.0–8.2)
Sodium: 139 meq/L (ref 135–145)
Urea Nitrogen: 25 mg/dL — ABNORMAL HIGH (ref 8–21)
eGFR by CKD-EPI 2021: >60 mL/min/1.73_m2 (ref >59)

## 2024-01-24 LAB — CBC, DIFF
% Basophils: 1 %
% Eosinophils: 2 %
% Immature Granulocytes: 0 %
% Lymphocytes: 20 %
% Monocytes: 7 %
% Neutrophils: 70 %
% Nucleated RBC: 0 %
Absolute Eosinophil Count: 0.18 10*3/uL (ref 0.00–0.50)
Absolute Lymphocyte Count: 1.96 10*3/uL (ref 1.00–4.80)
Basophils: 0.06 10*3/uL (ref 0.00–0.20)
Hematocrit: 45 % (ref 36.0–45.0)
Hemoglobin: 14.8 g/dL (ref 11.5–15.5)
Immature Granulocytes: 0.03 10*3/uL (ref 0.00–0.05)
MCH: 30 pg (ref 27.3–33.6)
MCHC: 32.8 g/dL (ref 32.2–36.5)
MCV: 91 fL (ref 81–98)
Monocytes: 0.64 10*3/uL (ref 0.00–0.80)
Neutrophils: 6.89 10*3/uL (ref 1.80–7.00)
Nucleated RBC: 0 10*3/uL
Platelet Count: 311 10*3/uL (ref 150–400)
RBC: 4.94 10*6/uL (ref 3.80–5.00)
RDW-CV: 15 % — ABNORMAL HIGH (ref 11.0–14.5)
WBC: 9.76 10*3/uL (ref 4.3–10.0)

## 2024-01-24 LAB — LIPID PANEL
Cholesterol/HDL Ratio: 2.3
HDL Cholesterol: 82 mg/dL (ref >39)
LDL Cholesterol, NIH Equation: 93 mg/dL (ref <130)
Non-HDL Cholesterol: 107 mg/dL (ref 0–159)
Total Cholesterol: 189 mg/dL (ref <200)
Triglyceride: 75 mg/dL (ref <150)

## 2024-01-24 LAB — C_REACTIVE PROTEIN: C_Reactive Protein: 7.2 mg/L (ref 0.0–10.0)

## 2024-01-24 NOTE — Progress Notes (Signed)
 [address] [place] [address]  [address]  [place], [place]  [address]  TEL: [phone]  l  FAX: [phone]    Follow Up Clinic Note  01/24/2024    IDENTIFYING DATA/CC:    Caitlin Wiley is a very pleasant 74 year old female who presents for follow up of rheumatoid arthritis, osteoarthritis.    RHEUMATOLOGIC HISTORY OF PRESENT ILLNESS:  Diagnosis: RA, OA; initial contult 11/30/2017  Presenting Symptoms/Timeline: 2013 joint pain hands, wrists, knees, shoulders  Autoimmune Serologies: RF 140, CCP 16; CRP 17, ESR 23; UA 4.6  Pertinent Radiology: Xrays of hands, wrists, knees, feet: No erosions, consistent with OA, chondrocalcinosis  Medications Used: MXT 11/2017  Significant Flares:  Immunization Status: Influenza, PCV13, PPSV23, Zoster  Infectious Disease Screening: Hepatitis B NR, Hepatitis C, TB quantiferon NR  Other: Afib on chronic anticoag,   SH: Lives alone. Has sister, SIL and her children in town    INTERVAL HISTORY:  Patient seen today for follow-up of rheumatoid arthritis and osteoarthritis.  She was last seen 4 months ago at which time she reported stable disease.  Her main complaint was osteoarthritis pain related to increase in activity.  Patient is on methotrexate  monotherapy for treatment of her inflammatory arthritis. We did decrease MTX to 15mg  once a week last visit due to complaints of hair loss.    Overall stable health. She has noticed improvement in hair loss with the decrease in MTX but also noticed some increase in arthralgia overall. Also having pain in knees, neck, and hands.    REVIEW OF SYSTEMS:  Complete ROS is negative, except for those mentioned above in the Interval History    PROBLEM LIST:  Patient Active Problem List    Diagnosis Date Noted    Immunosuppression (HCC) [D84.9] 11/20/2020    Lichen sclerosus [L90.0] 04/30/2020    Cervical polyp [N84.1] 01/10/2020    Rheumatoid arthritis involving multiple sites with positive rheumatoid factor (HCC) [M05.79] 02/08/2018    Primary  osteoarthritis of both knees [M17.0] 09/12/2017    Chronic anticoagulation [Z79.01] 08/02/2015    Paroxysmal atrial fibrillation (HCC) [I48.0] 04/17/2014     A. S/p ablation (Cryo PVI) 10/19/2016      GERD (gastroesophageal reflux disease) [K21.9] 11/08/2012    Asthma (HCC) [J45.909] 07/24/2012    Edema [R60.9] 07/24/2012    Sleep apnea [G47.30] 07/24/2012    Pulmonary nodule [R91.1] 07/24/2012       SOCIAL/FAMILY HISTORY:  Reviewed with patient. No changes from what is currently noted in EPIC    ALLERGIES:  Review of patient's allergies indicates:  Allergies   Allergen Reactions    Bee Venom Anaphylaxis    Ciprofloxacin Other     Joint aching    Pneumococcal Vaccines Skin: Hives    Sulfa Antibiotics Skin: Hives    Cats [Animals] Skin: Itching    Dust Mite Extract Skin: Itching     DUST    Pollen Extract Skin: Itching       MEDICATIONS:  Current Medications[1]    PHYSICAL EXAMINATION:  Vital signs:  BP 129/78   Pulse 82   LMP  (LMP Unknown)   SpO2 96%   General:  Awake, alert, and oriented, no apparent distress, pleasant, and cooperative  Psychologic:  Mood is euthymic, affect is congruent  EYES: Anicteric sclera, no conjunctival injection  ENT:  Normocephalic, atraumatic, moist membranes, no oral ulcers   Pulmonary:  Non-labored breathing  Skin:  Normal temperature and texture, No visible rashes,  ulcers, or nodules  Neurologic:  Light touch sensation is grossly intact. Face symmetric   Musculoskeletal:    Gait: Normal  Joints: No evidence of synovitis, joint swelling, or tenderness in the joints. Diffuse Heberdens nodes.  Nails: No pitting, clubbing, or cyanosis noted in the fingernails bilaterally   Muscle strength: Strength is grossly 5 out of 5 throughout the bilateral upper and lower extremities    LABS:    Pending    RADIOLOGY:   None    DISEASE ACTIVITY:  Patient Function: 1  Patient Pain Assessment: 3  Patient Global Assessment: 1   Combined Rapid 3 Score: 5    ASSESSMENT AND PLAN:  Caitlin Wiley  is a very pleasant 74 year old female who presents for follow up of rheumatoid arthritis, osteoarthritis.    #. Rheumatoid arthritis. Seropositive, nonerosive RA. Doing much better since MTX.  No evidence of synovitis on exam today. Knee pain, CMC joint pain sounds mechanical in nature and likely related to osteoarthritis. We previously discussed therapeutic options including corticosteroid or hyaluronic acid derivative injection in the future.  She is a candidate for total knee replacement surgery once she is able to reach her target BMI.       Reports improvement in hair loss with decrease in MTX to 15mg  weekly. She can remain on this dose although notes a bit more arthralgia in general. She can try MTX 17.5mg . Monitor.      - cont methotrexate  15mg  once a week   - cont folic acid  3mg  daily     #. High risk medication. MTX toxicity labs today.     #. Osteoarthritis. Most bothersome in the knees and is activity related. She is interested in TKR but will need to lose some weight first.  Patient is getting closer to her goal.   Recent discussed restarting physical therapy.  She has difficulty doing exercises at home.  I am concerned about her ambulation.  She also climbs multiple stairs a day.  Referral to PT was provided today.  We did not follow-up on this.     #. Left cubital tunnel neuropathy. Discussed conservative management.     #. Immunization. She we will get updated flu and COVID-vaccine.     #. Bone health. Last DEXA from 2017 was normal. DEXA 06/2018 normal.      - optimization of calcium and vitamin D    - daily weight bearing exercises       All the patient's questions were answered. The patient will follow up in 4 months.    Dictated using child psychotherapist and electronically signed by Arvil Lango, MD   Note: This document may contain errors inherent to this technology. If you find any errors that might affect patient care, please contact our office as soon as possible.   [address], [address], [place], [place]  [address]  Ph. [phone] Fx. [phone]         [1]   Current Outpatient Medications   Medication Sig Dispense Refill    acetaminophen 500 MG tablet Take 1 tablet (500 mg) by mouth 2 times a day.      albuterol  HFA 108 (90 Base) MCG/ACT inhaler inhale 1 to 2 puffs by mouth every 6 hours if needed for shortness of breath or wheezing 8.5 g 3    apixaban  (Eliquis ) 5 MG tablet Take 1 tablet (5 mg) by mouth 2 times a day. 180 tablet 3    Cholecalciferol (VITAMIN  D3 OR) Take by mouth.      clobetasol  0.05 % ointment APPLY TOPICALLY TO AFFECTED AREAS OF VULVA three times a week 60 g 3    famotidine  20 MG tablet Take 1 tablet (20 mg) by mouth 2 times a day. 180 tablet 3    Flaxseed, Linseed, (FLAXSEED OIL) 1200 MG Oral Cap None Entered      folic acid  1 MG tablet Take 3 tablets (3 mg) by mouth daily. 270 tablet 0    GLUCOSAMINE CHONDROITIN COMPLX OR       Magnesium 400 MG Oral Cap Take 1 capsule (400 mg) by mouth.      methotrexate  2.5 MG tablet Take 8 tablets (20 mg) by mouth every 7 days. 96 tablet 1    montelukast  10 MG tablet Take 1 tablet (10 mg) by mouth daily. 90 tablet 3    Multiple Vitamins-Minerals (MULTIVITAMIN OR) no iron      Probiotic Product (PROBIOTIC DAILY OR)       SIMETHICONE-80 OR Take 2-3 tablets by mouth daily as needed.      triamterene -hydroCHLOROthiazide 37.5-25 MG tablet Take 1 tablet by mouth daily. 90 tablet 3     No current facility-administered medications for this visit.

## 2024-01-24 NOTE — Patient Instructions (Signed)
 It was a pleasure seeing you in clinic today.    Continue methotrexate  15mg  once a week. Continue folic acid  3mg    Get labs after your appointment today  Follow up in 4 months    If you have any questions or concerns before your next appointment, please do not hesitate to contact me. Our clinic phone number is [phone] and I am also available via [facility].

## 2024-01-25 MED ORDER — ALBUTEROL SULFATE HFA 108 (90 BASE) MCG/ACT IN AERS: INHALATION_SPRAY | RESPIRATORY_TRACT | 0 refills | Status: DC

## 2024-01-26 ENCOUNTER — Other Ambulatory Visit (INDEPENDENT_AMBULATORY_CARE_PROVIDER_SITE_OTHER): Payer: Self-pay | Admitting: Family Medicine

## 2024-01-26 DIAGNOSIS — I480 Paroxysmal atrial fibrillation: Secondary | ICD-10-CM

## 2024-01-27 ENCOUNTER — Ambulatory Visit (INDEPENDENT_AMBULATORY_CARE_PROVIDER_SITE_OTHER): Admitting: Family Medicine

## 2024-01-27 VITALS — BP 113/71 | HR 81 | Temp 99.2°F | Wt 271.0 lb

## 2024-01-27 DIAGNOSIS — M170 Bilateral primary osteoarthritis of knee: Secondary | ICD-10-CM

## 2024-01-27 DIAGNOSIS — J4520 Mild intermittent asthma, uncomplicated: Secondary | ICD-10-CM

## 2024-01-27 DIAGNOSIS — R600 Localized edema: Secondary | ICD-10-CM

## 2024-01-27 DIAGNOSIS — Z7901 Long term (current) use of anticoagulants: Secondary | ICD-10-CM

## 2024-01-27 DIAGNOSIS — I480 Paroxysmal atrial fibrillation: Secondary | ICD-10-CM

## 2024-01-27 DIAGNOSIS — L900 Lichen sclerosus et atrophicus: Secondary | ICD-10-CM

## 2024-01-27 DIAGNOSIS — Z0000 Encounter for general adult medical examination without abnormal findings: Secondary | ICD-10-CM

## 2024-01-27 DIAGNOSIS — K219 Gastro-esophageal reflux disease without esophagitis: Secondary | ICD-10-CM

## 2024-01-27 MED ORDER — FAMOTIDINE 20 MG OR TABS: 20.0000 mg | ORAL_TABLET | Freq: Two times a day (BID) | ORAL | 3 refills | Status: AC

## 2024-01-27 MED ORDER — CLOBETASOL PROPIONATE 0.05 % EX OINT: TOPICAL_OINTMENT | CUTANEOUS | 3 refills | Status: AC

## 2024-01-27 MED ORDER — APIXABAN 5 MG OR TABS: 5.0000 mg | ORAL_TABLET | Freq: Two times a day (BID) | ORAL | 3 refills | Status: AC

## 2024-01-27 MED ORDER — TRIAMTERENE-HCTZ 37.5-25 MG OR TABS: 1.0000 | ORAL_TABLET | Freq: Every day | ORAL | 3 refills | Status: AC

## 2024-01-27 MED ORDER — MONTELUKAST SODIUM 10 MG OR TABS: 10.0000 mg | ORAL_TABLET | Freq: Every day | ORAL | 3 refills | Status: AC

## 2024-01-27 MED ORDER — ALBUTEROL SULFATE HFA 108 (90 BASE) MCG/ACT IN AERS: INHALATION_SPRAY | RESPIRATORY_TRACT | 2 refills | Status: AC

## 2024-01-27 NOTE — Patient Instructions (Signed)
 It is very important to maintain a healthy lifestyle.  This begins with eating a balanced diet that includes daily servings of fruit, vegetables and whole grains.  It is much healthier to prepare your own meals.  Try to avoid fast food and sugared drinks.  Maintaining an active lifestyle is also important.  This includes 3-4 days of an exercise that increases your heart rate (cardio) for at least 20-30 minutes.  Examples of cardiovascular exercise are walking, jogging, swimming, biking.  Stretching exercises such as yoga and working with weights can also have great benefit.     Specific health issues that are important for you to manage are:    Atrial Fibrillation  Rheumatoid arthritis

## 2024-01-27 NOTE — Progress Notes (Signed)
 ANNUAL WELLNESS VISIT        Caitlin Wiley is a 74 year old female who presents for a subsequent Annual Wellness Visit.    Is this a face-to-face video or an audio-only visit?  No       INFORMATION GATHERING:      The following areas were confirmed with patient/caregiver and/or updated in Epic at this visit:   Past Medical, Surgical, Family, and Social History  Current medications and supplements  Allergies  All of the above components have been reviewed and updated: yes    Opioid Use:   Patient not taking opioids       I have not reviewed the patient's risk factors for other substance use disorders.  If appropriate, I've referred the patient for treatment.      Please see today's HRA for review of patient's functional ability and level of safety. Concerns are addressed below in the Personalized Prevention Plan section.    For list of current providers and suppliers, see Care Team Section, EHR encounters, and/or HRA questionnaire for [facility] providers involved in care    Other providers and suppliers outside [facility]:   No outside providers identified    Depression screening:    PHQ-2: (Patient-Rptd) 1  PHQ-9 (if done):       EXAM:     BP 113/71   Pulse 81   Temp 37.3 C (Temporal)   Wt (!) 122.9 kg (271 lb)   LMP  (LMP Unknown)   SpO2 96%   BMI 43.74 kg/m         GAIT:   Abnormal as assessed by direct observation (describe): using walker    COGNITION:     Intact          ADVANCE CARE PLANNING (ACP)       Advance care planning:  declined    No advance directive discussion during this visit    ASSESSMENT:       Caitlin Wiley was seen for her Annual Wellness Visit, including identification of risk factors & conditions that may affect her health and function in the future.         (Z00.00) Encounter for Medicare annual wellness exam  (primary encounter diagnosis)  Plan:         Actions at this visit:      Established or updated a written schedule of screening and prevention measures  recommended and appropriate for [name] for the next 5-10 years  Established or updated a list of her risk factors and conditions for which lifestyle or medical interventions are recommended or underway, including mental health risks and conditions, and including risks/benefits of treatment  Furnished personalized health advice and, as appropriate, referrals to health education or preventive counseling services or programs (such as fall prevention, tobacco cessation, physical activity, nutrition, cognition, weight loss)    All of the above components have been reviewed and updated. yes    Personalized Prevention Plan:      It is very important to maintain a healthy lifestyle.  This begins with eating a balanced diet that includes daily servings of fruit, vegetables and whole grains.  It is much healthier to prepare your own meals.  Try to avoid fast food and sugared drinks.  Maintaining an active lifestyle is also important.  This includes 3-4 days of an exercise that increases your heart rate (cardio) for at least 20-30 minutes.  Examples of cardiovascular exercise are walking, jogging, swimming, biking.  Stretching exercises such as yoga and working with weights can also have great benefit.     Specific health issues that are important for you to manage are:    Atrial Fibrillation  Rheumatoid arthritis     Here are screening & prevention measures recommended for you:  Health Maintenance   Topic Date Due    RSV Vaccine (1 - Risk 50-74 years 1-dose series) Never done    Medicare Annual Wellness Visit  01/27/2024    COVID-19 Vaccine (12 - Moderna risk 2025-26 season) 04/06/2024    Depression Screening (PHQ-2)  01/24/2025    Breast Cancer Screening  10/02/2025    Colorectal Cancer Screening  05/16/2026    Lipid Disorders Screening  01/23/2029    DTaP, Tdap and Td Vaccines (4 - Td or Tdap) 12/25/2031    Pneumococcal Vaccine: 50+ Years  Completed    Zoster Vaccine  Completed    Influenza Vaccine   Completed    Hepatitis C Screening  Completed    HPV Vaccine (No Doses Required) Completed    Hepatitis A Vaccine  Aged Out    Meningococcal B Vaccine  Aged Out    Osteoporosis Screening  Discontinued    Hepatitis B Vaccine  Discontinued       Please plan to have a Subsequent Annual Wellness Visit in 1 year.      ================================================================    HPI  Caitlin Wiley is a 74 year old female who presents for Medicare Wellness, but has several other issues to address.  She is most concerned about her knees.  Knows she has arthritis.  Has been told in past she should consider knee replacement.  Pain and activity worsening.  She would like to consult with ortho again.     She is needing refills of all her medications.  These include albuterol , apixaban , clobetasol , famotidine  and her diuretic.  She denies any acute issues or problems with medications.      Review of patient's allergies indicates:  Allergies   Allergen Reactions    Bee Venom Anaphylaxis    Ciprofloxacin Other     Joint aching    Pneumococcal Vaccines Skin: Hives    Sulfa Antibiotics Skin: Hives    Cats [Animals] Skin: Itching    Dust Mite Extract Skin: Itching     DUST    Pollen Extract Skin: Itching         Social History     Tobacco Use    Smoking status: Never    Smokeless tobacco: Never   Substance Use Topics    Alcohol use: No     Alcohol/week: 0.0 standard drinks of alcohol     Comment: rare              Physical Exam  BP 113/71   Pulse 81   Temp 37.3 C (Temporal)   Wt (!) 122.9 kg (271 lb)   LMP  (LMP Unknown)   SpO2 96%   BMI 43.74 kg/m   General:  alert, cooperative, no acute distress  Lung:  clear to auscultation bilaterally  Heart:  regular rate and rythmn, no murmurs  Extremity exam:  warm, pink, 3+ edema  Gait:  unsteady, using walker      Assesment/Plan  (M17.0) Primary osteoarthritis of both knees  Plan: XR Knee 3 View Left, XR Knee 3 View Right,         Referral to Hip/Knee Replacement         She has known  advanced arthritis of knees.  She reports being seen previously by an ortho near [place].  Will obtain updated xrays.  Ortho referral provided.      (J45.20) Mild intermittent asthma without complication (HCC)  Plan: albuterol  HFA 108 (90 Base) MCG/ACT inhaler,         montelukast  10 MG tablet            (I48.0) Paroxysmal atrial fibrillation (HCC)  Plan: apixaban  (Eliquis ) 5 MG tablet            (L90.0) Lichen sclerosus  Plan: clobetasol  0.05 % ointment            (K21.9) Gastroesophageal reflux disease without esophagitis  Plan: famotidine  20 MG tablet            (R60.0) Edema of both legs  Plan: triamterene -hydroCHLOROthiazide 37.5-25 MG         tablet

## 2024-01-27 NOTE — Progress Notes (Signed)
 Visit: pt is here for a wellness. Pt would liked to ask referral for knees and spine.     Refills? YES  Referral? NO  Letter or Form? NO  Lab Results? YES       Have you seen a specialist since your last visit: YES     Vaccines Due? NO     Does patient have eCare?  YES     HM Due:   Health Maintenance   Topic Date Due    RSV Vaccine (1 - Risk 50-74 years 1-dose series) Never done    Medicare Annual Wellness Visit  01/27/2024    COVID-19 Vaccine (12 - Moderna risk 2025-26 season) 04/06/2024    Depression Screening (PHQ-2)  01/24/2025    Breast Cancer Screening  10/02/2025    Colorectal Cancer Screening  05/16/2026    Lipid Disorders Screening  01/23/2029    DTaP, Tdap and Td Vaccines (4 - Td or Tdap) 12/25/2031    Pneumococcal Vaccine: 50+ Years  Completed    Zoster Vaccine  Completed    Influenza Vaccine  Completed    Hepatitis C Screening  Completed    HPV Vaccine (No Doses Required) Completed    Hepatitis A Vaccine  Aged Out    Meningococcal B Vaccine  Aged Out    Osteoporosis Screening  Discontinued    Hepatitis B Vaccine  Discontinued       PCP Verified?  Yes, Wiley, Caitlin HERO, MD        Answers submitted by the patient for this visit:  Return Medicare Annual Wellness Visit on 01/27/2024 11:00 AM with Caitlin Wiley  Health Screening: Depression (Submitted on 01/25/2024)  PHQ2 Score: 1

## 2024-01-28 NOTE — Telephone Encounter (Signed)
 The original prescription was reordered on 01/27/2024 by El-Attar, Elvie HERO, MD

## 2024-01-31 ENCOUNTER — Encounter (INDEPENDENT_AMBULATORY_CARE_PROVIDER_SITE_OTHER)

## 2024-01-31 ENCOUNTER — Telehealth (INDEPENDENT_AMBULATORY_CARE_PROVIDER_SITE_OTHER): Payer: Self-pay | Admitting: Family Medicine

## 2024-02-01 ENCOUNTER — Inpatient Hospital Stay (INDEPENDENT_AMBULATORY_CARE_PROVIDER_SITE_OTHER): Admit: 2024-02-01 | Discharge: 2024-02-01

## 2024-02-01 ENCOUNTER — Other Ambulatory Visit (INDEPENDENT_AMBULATORY_CARE_PROVIDER_SITE_OTHER): Payer: Self-pay | Admitting: Family Medicine

## 2024-02-01 DIAGNOSIS — M170 Bilateral primary osteoarthritis of knee: Secondary | ICD-10-CM

## 2024-02-02 ENCOUNTER — Ambulatory Visit (INDEPENDENT_AMBULATORY_CARE_PROVIDER_SITE_OTHER): Payer: Self-pay | Admitting: Family Medicine

## 2024-05-22 ENCOUNTER — Ambulatory Visit: Admitting: Rheumatology
